# Patient Record
Sex: Male | Born: 1935
Health system: Southern US, Community
[De-identification: ages and names within clinical notes are randomized; demographics above are authoritative.]

## PROBLEM LIST (undated history)

## (undated) DIAGNOSIS — C679 Malignant neoplasm of bladder, unspecified: Secondary | ICD-10-CM

## (undated) DIAGNOSIS — C61 Malignant neoplasm of prostate: Secondary | ICD-10-CM

## (undated) DIAGNOSIS — F0390 Unspecified dementia without behavioral disturbance: Secondary | ICD-10-CM

## (undated) DIAGNOSIS — I10 Essential (primary) hypertension: Secondary | ICD-10-CM

## (undated) DIAGNOSIS — M1990 Unspecified osteoarthritis, unspecified site: Secondary | ICD-10-CM

## (undated) DIAGNOSIS — E785 Hyperlipidemia, unspecified: Secondary | ICD-10-CM

## (undated) DIAGNOSIS — G839 Paralytic syndrome, unspecified: Secondary | ICD-10-CM

## (undated) DIAGNOSIS — N529 Male erectile dysfunction, unspecified: Secondary | ICD-10-CM

## (undated) DIAGNOSIS — G20 Parkinson's disease: Secondary | ICD-10-CM

## (undated) DIAGNOSIS — J189 Pneumonia, unspecified organism: Secondary | ICD-10-CM

## (undated) DIAGNOSIS — G20A1 Parkinson's disease without dyskinesia, without mention of fluctuations: Secondary | ICD-10-CM

## (undated) HISTORY — PX: BACK SURGERY: SHX140

## (undated) HISTORY — PX: APPENDECTOMY: SHX54

## (undated) HISTORY — PX: OTHER SURGICAL HISTORY: SHX169

## (undated) HISTORY — DX: Essential (primary) hypertension: I10

## (undated) HISTORY — DX: Male erectile dysfunction, unspecified: N52.9

## (undated) HISTORY — DX: Hyperlipidemia, unspecified: E78.5

## (undated) HISTORY — DX: Unspecified osteoarthritis, unspecified site: M19.90

---

## 2001-09-25 ENCOUNTER — Encounter: Admission: RE | Admit: 2001-09-25 | Discharge: 2001-09-25 | Payer: Self-pay | Admitting: Family Medicine

## 2001-09-25 ENCOUNTER — Encounter: Payer: Self-pay | Admitting: Family Medicine

## 2005-07-11 HISTORY — PX: COLONOSCOPY: SHX174

## 2005-11-16 ENCOUNTER — Ambulatory Visit: Payer: Self-pay | Admitting: Gastroenterology

## 2005-12-02 ENCOUNTER — Ambulatory Visit: Payer: Self-pay | Admitting: Gastroenterology

## 2005-12-02 LAB — HM COLONOSCOPY

## 2005-12-04 LAB — HM COLONOSCOPY

## 2006-04-11 ENCOUNTER — Encounter: Admission: RE | Admit: 2006-04-11 | Discharge: 2006-04-11 | Payer: Self-pay | Admitting: Family Medicine

## 2006-04-11 ENCOUNTER — Ambulatory Visit: Payer: Self-pay | Admitting: Family Medicine

## 2006-04-28 ENCOUNTER — Ambulatory Visit: Payer: Self-pay | Admitting: Family Medicine

## 2006-05-25 ENCOUNTER — Ambulatory Visit: Payer: Self-pay | Admitting: Family Medicine

## 2007-01-11 ENCOUNTER — Ambulatory Visit: Payer: Self-pay | Admitting: Family Medicine

## 2007-07-17 ENCOUNTER — Ambulatory Visit: Payer: Self-pay | Admitting: Family Medicine

## 2007-07-25 ENCOUNTER — Ambulatory Visit: Payer: Self-pay | Admitting: Family Medicine

## 2008-07-01 ENCOUNTER — Ambulatory Visit: Payer: Self-pay | Admitting: Family Medicine

## 2008-07-09 ENCOUNTER — Ambulatory Visit: Payer: Self-pay | Admitting: Family Medicine

## 2008-09-04 ENCOUNTER — Ambulatory Visit: Payer: Self-pay | Admitting: Family Medicine

## 2009-07-11 HISTORY — PX: OTHER SURGICAL HISTORY: SHX169

## 2009-08-03 ENCOUNTER — Ambulatory Visit: Payer: Self-pay | Admitting: Family Medicine

## 2009-09-02 ENCOUNTER — Ambulatory Visit: Payer: Self-pay | Admitting: Family Medicine

## 2010-02-22 ENCOUNTER — Inpatient Hospital Stay (HOSPITAL_COMMUNITY): Admission: RE | Admit: 2010-02-22 | Discharge: 2010-02-25 | Payer: Self-pay | Admitting: Orthopedic Surgery

## 2010-09-24 LAB — CBC
HCT: 25.5 % — ABNORMAL LOW (ref 39.0–52.0)
HCT: 28.1 % — ABNORMAL LOW (ref 39.0–52.0)
HCT: 31.6 % — ABNORMAL LOW (ref 39.0–52.0)
HCT: 39.5 % (ref 39.0–52.0)
Hemoglobin: 8.7 g/dL — ABNORMAL LOW (ref 13.0–17.0)
Hemoglobin: 13.7 g/dL (ref 13.0–17.0)
MCH: 29.8 pg (ref 26.0–34.0)
MCH: 30.1 pg (ref 26.0–34.0)
MCH: 30.1 pg (ref 26.0–34.0)
MCH: 30.8 pg (ref 26.0–34.0)
MCHC: 34.7 g/dL (ref 30.0–36.0)
MCV: 87.3 fL (ref 78.0–100.0)
MCV: 88.2 fL (ref 78.0–100.0)
MCV: 88.8 fL (ref 78.0–100.0)
Platelets: 151 10*3/uL (ref 150–400)
Platelets: 153 10*3/uL (ref 150–400)
Platelets: 161 10*3/uL (ref 150–400)
RBC: 2.89 MIL/uL — ABNORMAL LOW (ref 4.22–5.81)
RBC: 3.22 MIL/uL — ABNORMAL LOW (ref 4.22–5.81)
RBC: 3.56 MIL/uL — ABNORMAL LOW (ref 4.22–5.81)
RBC: 4.45 MIL/uL (ref 4.22–5.81)
WBC: 11.8 10*3/uL — ABNORMAL HIGH (ref 4.0–10.5)

## 2010-09-24 LAB — URINALYSIS, ROUTINE W REFLEX MICROSCOPIC
Bilirubin Urine: NEGATIVE
Glucose, UA: NEGATIVE mg/dL
Hgb urine dipstick: NEGATIVE
Ketones, ur: NEGATIVE mg/dL
Nitrite: NEGATIVE
Protein, ur: NEGATIVE mg/dL
Specific Gravity, Urine: 1.024 (ref 1.005–1.030)
Urobilinogen, UA: 0.2 mg/dL (ref 0.0–1.0)
pH: 5.5 (ref 5.0–8.0)

## 2010-09-24 LAB — APTT: aPTT: 28 seconds (ref 24–37)

## 2010-09-24 LAB — BASIC METABOLIC PANEL
BUN: 15 mg/dL (ref 6–23)
CO2: 28 mEq/L (ref 19–32)
CO2: 26 mEq/L (ref 19–32)
Chloride: 101 mEq/L (ref 96–112)
Chloride: 104 mEq/L (ref 96–112)
Creatinine, Ser: 1.32 mg/dL (ref 0.4–1.5)
GFR calc Af Amer: >60 mL/min (ref >60)
GFR calc Af Amer: >60 mL/min (ref >60)
Glucose, Bld: 94 mg/dL (ref 70–99)
Potassium: 3.8 mEq/L (ref 3.5–5.1)
Potassium: 4.5 mEq/L (ref 3.5–5.1)
Sodium: 139 mEq/L (ref 135–145)

## 2010-09-24 LAB — DIFFERENTIAL
Basophils Relative: 0 % (ref 0–1)
Eosinophils Absolute: 0.3 10*3/uL (ref 0.0–0.7)
Lymphs Abs: 2.5 10*3/uL (ref 0.7–4.0)
Monocytes Absolute: 0.5 10*3/uL (ref 0.1–1.0)
Monocytes Relative: 7 % (ref 3–12)
Neutrophils Relative %: 60 % (ref 43–77)

## 2010-09-24 LAB — PROTIME-INR: INR: 1.02 (ref 0.00–1.49)

## 2010-09-24 LAB — SURGICAL PCR SCREEN
MRSA, PCR: NEGATIVE
Staphylococcus aureus: NEGATIVE

## 2010-09-24 LAB — TYPE AND SCREEN
ABO/RH(D): A POS
Antibody Screen: NEGATIVE

## 2010-09-24 LAB — ABO/RH: ABO/RH(D): A POS

## 2010-11-23 ENCOUNTER — Other Ambulatory Visit: Payer: Self-pay | Admitting: Family Medicine

## 2010-11-26 ENCOUNTER — Other Ambulatory Visit: Payer: Self-pay | Admitting: Family Medicine

## 2010-12-18 ENCOUNTER — Encounter: Payer: Self-pay | Admitting: Gastroenterology

## 2011-01-31 ENCOUNTER — Encounter: Payer: Self-pay | Admitting: Family Medicine

## 2011-01-31 ENCOUNTER — Ambulatory Visit (INDEPENDENT_AMBULATORY_CARE_PROVIDER_SITE_OTHER): Payer: Medicare Other | Admitting: Family Medicine

## 2011-01-31 VITALS — BP 136/80 | HR 87 | Wt 184.0 lb

## 2011-01-31 DIAGNOSIS — N644 Mastodynia: Secondary | ICD-10-CM

## 2011-01-31 NOTE — Progress Notes (Signed)
  Subjective:    Patient ID: Brian Montgomery, male    DOB: 19-Aug-1935, 75 y.o.   MRN: [id number]  HPI He notes tenderness in his left breast for the last 3 weeks. He's had no recent injury to that area. He has not seen any discharge from the breasts. He is on medications listed in the chart. He does not use street drugs.   Review of Systems     Objective:   Physical Exam Exam of the left breast does show a slight fullness in the nipple area. Abdominal exam shows no masses or tenderness.       Assessment & Plan:  Left breast pain. He will be sent for mammogram.

## 2011-03-28 ENCOUNTER — Encounter: Payer: Self-pay | Admitting: Family Medicine

## 2011-05-27 ENCOUNTER — Other Ambulatory Visit: Payer: Self-pay | Admitting: Family Medicine

## 2011-05-27 NOTE — Telephone Encounter (Signed)
Pt needs med check

## 2011-07-13 DIAGNOSIS — J069 Acute upper respiratory infection, unspecified: Secondary | ICD-10-CM | POA: Diagnosis not present

## 2011-08-21 ENCOUNTER — Other Ambulatory Visit: Payer: Self-pay | Admitting: Family Medicine

## 2011-11-24 ENCOUNTER — Other Ambulatory Visit: Payer: Self-pay | Admitting: Family Medicine

## 2012-01-03 DIAGNOSIS — M1710 Unilateral primary osteoarthritis, unspecified knee: Secondary | ICD-10-CM | POA: Diagnosis not present

## 2012-02-24 ENCOUNTER — Other Ambulatory Visit: Payer: Self-pay | Admitting: Family Medicine

## 2012-02-24 NOTE — Telephone Encounter (Signed)
PATIENT NEEDS TO SCHEDULE A OFFICE VISIT IN ORDER TO GET ANY MORE REFILLS.

## 2012-03-22 ENCOUNTER — Other Ambulatory Visit: Payer: Self-pay | Admitting: Family Medicine

## 2012-03-23 ENCOUNTER — Telehealth: Payer: Self-pay | Admitting: Internal Medicine

## 2012-03-23 MED ORDER — SIMVASTATIN 20 MG PO TABS: 20.0000 mg | ORAL_TABLET | Freq: Every day | ORAL | Status: DC

## 2012-03-23 NOTE — Telephone Encounter (Signed)
Sent med in pt needs med check 

## 2012-03-29 ENCOUNTER — Encounter: Payer: Self-pay | Admitting: Gastroenterology

## 2012-04-16 ENCOUNTER — Other Ambulatory Visit: Payer: Self-pay | Admitting: Family Medicine

## 2012-04-24 ENCOUNTER — Other Ambulatory Visit: Payer: Self-pay | Admitting: Family Medicine

## 2012-05-24 ENCOUNTER — Telehealth: Payer: Self-pay | Admitting: Internal Medicine

## 2012-05-24 NOTE — Telephone Encounter (Signed)
I have called pt on cell phone and mailbox is full so can't leave a message, and called pt at the home number and and left message and told him to call. HE NEEDS TO SET UP AN APPT SO HE CAN GET HIS MEDS REFILLED.

## 2012-05-25 ENCOUNTER — Encounter: Payer: Medicare Other | Admitting: Family Medicine

## 2012-05-25 ENCOUNTER — Ambulatory Visit (INDEPENDENT_AMBULATORY_CARE_PROVIDER_SITE_OTHER): Payer: Medicare Other | Admitting: Family Medicine

## 2012-05-25 ENCOUNTER — Encounter: Payer: Self-pay | Admitting: Family Medicine

## 2012-05-25 VITALS — BP 128/80 | HR 68 | Wt 183.0 lb

## 2012-05-25 DIAGNOSIS — I10 Essential (primary) hypertension: Secondary | ICD-10-CM | POA: Diagnosis not present

## 2012-05-25 DIAGNOSIS — Z79899 Other long term (current) drug therapy: Secondary | ICD-10-CM

## 2012-05-25 DIAGNOSIS — M129 Arthropathy, unspecified: Secondary | ICD-10-CM

## 2012-05-25 DIAGNOSIS — Z23 Encounter for immunization: Secondary | ICD-10-CM

## 2012-05-25 DIAGNOSIS — E785 Hyperlipidemia, unspecified: Secondary | ICD-10-CM

## 2012-05-25 DIAGNOSIS — N529 Male erectile dysfunction, unspecified: Secondary | ICD-10-CM | POA: Diagnosis not present

## 2012-05-25 DIAGNOSIS — M1990 Unspecified osteoarthritis, unspecified site: Secondary | ICD-10-CM

## 2012-05-25 LAB — LIPID PANEL
HDL: 68 mg/dL (ref >39)
Triglycerides: 50 mg/dL (ref <150)

## 2012-05-25 LAB — COMPREHENSIVE METABOLIC PANEL
AST: 23 U/L (ref 0–37)
BUN: 18 mg/dL (ref 6–23)
Chloride: 104 mEq/L (ref 96–112)
Creat: 1.13 mg/dL (ref 0.50–1.35)
Glucose, Bld: 83 mg/dL (ref 70–99)
Potassium: 4.3 mEq/L (ref 3.5–5.3)

## 2012-05-25 MED ORDER — INFLUENZA VIRUS VACC SPLIT PF IM SUSP: 0.5000 mL | Freq: Once | INTRAMUSCULAR | Status: DC

## 2012-05-25 MED ORDER — SIMVASTATIN 20 MG PO TABS: 20.0000 mg | ORAL_TABLET | Freq: Every day | ORAL | Status: DC

## 2012-05-25 MED ORDER — TADALAFIL 20 MG PO TABS: 20.0000 mg | ORAL_TABLET | ORAL | Status: DC | PRN

## 2012-05-25 MED ORDER — LISINOPRIL-HYDROCHLOROTHIAZIDE 10-12.5 MG PO TABS: 1.0000 | ORAL_TABLET | Freq: Every day | ORAL | Status: DC

## 2012-05-25 NOTE — Progress Notes (Signed)
  Subjective:    Patient ID: Brian Montgomery, male    DOB: [birth year], 76 y.o.   MRN: [id number]  HPI He is here for a medication check. He has enjoyed excellent health. He did have left knee replacement and is doing quite well with this. He is having no other arthritis related symptoms. He does have some difficulty with erectile dysfunction and would like some medication for this. Continues on his blood pressure medication as well as Zocor and is having no difficulty with this. Social and family history were reviewed.   Review of Systems     Objective:   Physical Exam alert and in no distress. Tympanic membranes and canals are normal. Throat is clear. Tonsils are normal. Neck is supple without adenopathy or thyromegaly. Cardiac exam shows a regular sinus rhythm without murmurs or gallops. Lungs are clear to auscultation.        Assessment & Plan:   1. Hypertension  lisinopril-hydrochlorothiazide (PRINZIDE,ZESTORETIC) 10-12.5 MG per tablet, CBC with Differential, Comprehensive metabolic panel  2. ED (erectile dysfunction)  tadalafil (CIALIS) 20 MG tablet  3. Hyperlipidemia LDL goal < 100  simvastatin (ZOCOR) 20 MG tablet, Lipid panel  4. Arthritis    5. Encounter for long-term (current) use of other medications  Lipid panel, CBC with Differential, Comprehensive metabolic panel, DISCONTINUED: influenza  inactive virus vaccine (FLUZONE/FLUARIX) injection  6. Need for prophylactic vaccination and inoculation against influenza  influenza  inactive virus vaccine (FLUZONE/FLUARIX) injection 0.5 mL   flu shot given with risks and benefits discussed.

## 2012-05-26 LAB — CBC WITH DIFFERENTIAL/PLATELET
Basophils Absolute: 0 10*3/uL (ref 0.0–0.1)
Eosinophils Relative: 2 % (ref 0–5)
HCT: 41.4 % (ref 39.0–52.0)
Hemoglobin: 14 g/dL (ref 13.0–17.0)
Lymphocytes Relative: 31 % (ref 12–46)
MCHC: 33.8 g/dL (ref 30.0–36.0)
MCV: 89.2 fL (ref 78.0–100.0)
Monocytes Absolute: 0.5 10*3/uL (ref 0.1–1.0)
Monocytes Relative: 7 % (ref 3–12)
RDW: 14 % (ref 11.5–15.5)
WBC: 7.7 10*3/uL (ref 4.0–10.5)

## 2012-05-28 NOTE — Progress Notes (Signed)
Quick Note:  Called pt home to inform him of his labs pt verbalized understanding labs look good and continue present med's ______

## 2012-09-17 ENCOUNTER — Ambulatory Visit (INDEPENDENT_AMBULATORY_CARE_PROVIDER_SITE_OTHER): Payer: Medicare Other | Admitting: Family Medicine

## 2012-09-17 ENCOUNTER — Encounter: Payer: Self-pay | Admitting: Family Medicine

## 2012-09-17 VITALS — BP 112/72 | HR 64 | Ht 66.0 in | Wt 182.0 lb

## 2012-09-17 DIAGNOSIS — R42 Dizziness and giddiness: Secondary | ICD-10-CM | POA: Diagnosis not present

## 2012-09-17 DIAGNOSIS — R319 Hematuria, unspecified: Secondary | ICD-10-CM | POA: Diagnosis not present

## 2012-09-17 DIAGNOSIS — I10 Essential (primary) hypertension: Secondary | ICD-10-CM

## 2012-09-17 LAB — POCT URINALYSIS DIPSTICK
Bilirubin, UA: NEGATIVE
Ketones, UA: NEGATIVE
Leukocytes, UA: NEGATIVE
pH, UA: 6

## 2012-09-17 MED ORDER — MECLIZINE HCL 25 MG PO TABS: 12.5000 mg | ORAL_TABLET | Freq: Three times a day (TID) | ORAL | Status: DC | PRN

## 2012-09-17 NOTE — Progress Notes (Signed)
Chief Complaint  Patient presents with  . Dizziness    is having dizziness when he gets up and walks around, also when he is sitting and just moves his head x 5-7 days.    Woke up this morning, and when he got out of bed he felt very dizzy.  Needed to hold on to rail of bed to keep from falling.  Feels dizzy any time he moves his head, further described as his balance being off, some spinning sensation.  Denies feeling lightheaded or presyncopal. Denies any other neuro symptoms--no trouble with speech, memory, weakness, numbness or tingling.  No problems until today, was fine last night.  Slight cough, but no significant URI symptoms.  Slightly runny nose yesterday. Denies ear pain.  Has noted decreased hearing in R ear over the years, nothing acute.  Denies tinnitus.  Denies headache (slight one the other day).  About a week ago he had something similar happen--he felt dizzy when he sat up (moved to turn around, felt dizzy), but he laid back down, and when he got up later he was fine.    Noted some blood in his urine the other day (4 days ago).  Denies urgency/frequency/dysuria.  He states it started after doing a plank exercise.  Urine has been clear after just that one day (2 voids).  Denies fevers, flank pain or other problems  Past Medical History  Diagnosis Date  . ED (erectile dysfunction)   . Dyslipidemia   . Hypertension   . Arthritis    Past Surgical History  Procedure Laterality Date  . Left total knee  2011   History   Social History  . Marital Status: Married    Spouse Name: N/A    Number of Children: N/A  . Years of Education: N/A   Occupational History  . Not on file.   Social History Main Topics  . Smoking status: Never Smoker   . Smokeless tobacco: Never Used  . Alcohol Use: Yes     Comment: rare  . Drug Use: No  . Sexually Active: Yes   Other Topics Concern  . Not on file   Social History Narrative  . No narrative on file   Current Outpatient  Prescriptions on File Prior to Visit  Medication Sig Dispense Refill  . aspirin 81 MG tablet Take 81 mg by mouth daily.      . fish oil-omega-3 fatty acids 1000 MG capsule Take 1 g by mouth daily.       Marland Kitchen lisinopril-hydrochlorothiazide (PRINZIDE,ZESTORETIC) 10-12.5 MG per tablet Take 1 tablet by mouth daily.  30 tablet  11  . Multiple Vitamins-Minerals (MULTIVITAMIN WITH MINERALS) tablet Take 1 tablet by mouth daily.        . simvastatin (ZOCOR) 20 MG tablet Take 1 tablet (20 mg total) by mouth daily.  30 tablet  11  . tadalafil (CIALIS) 20 MG tablet Take 1 tablet (20 mg total) by mouth every other day as needed for erectile dysfunction.  3 tablet  0-   No current facility-administered medications on file prior to visit.   No Known Allergies  ROS:  Denies Denies back pain, fevers, nausea, vomiting, diarrhea, no blood in bowels.  No abdominal pain.  No numbness, tingling, weakness, headache, URI symptoms except her HPI.  Denies bleeding (just hematuria last week), bruising or other skin concerns.  PHYSICAL EXAM: BP 122/80  Pulse 76  Ht 5\' 6"  (1.676 m)  Wt 182 lb (82.555 kg)  BMI 29.39  kg/m2 Well developed, pleasant male, accompanied by his daughter, in no distress HEENT:  PERRL, EOMI, conjunctiva clear.  TM's and EACs normal bilaterally.  Nasal mucosa with clear drainage, no erythema or purulence.  Sinuses nontender Neck: no lymphadenopathy, thyromegaly or carotid bruit Heart: regular rate and rhythm, no murmur Lungs: clear bilaterally Abdomen: soft, nontender, no mass Extremities: no edema Skin: no rash, bruising Neuro: alert and oriented.  Cranial nerves 2-12 intact.  Normal strength, sensation.  Slow finger-to-nose, symmetric bilaterally.  Problems with rapid alternating movements but symmetric.  No pronator drift +vertigo with lying down and head turned to the right (started about 15-20 seconds after lying down).  Worse when he sat up, had to hold onto chair (no nystagmus  noted). When lying with head to left, had no dizziness until he sat up--was only very shortlived, and didn't need to hold on, only felt dizzy while en route to sitting up  ASSESSMENT/PLAN:  Dizziness and giddiness - suspect BPV, versus labyrinthitis (viral).  Use Meclizine prn.  risks/side effects reviewed.  consider PT if persistent/worsening vertigo. f/u if new symptoms - Plan: Comprehensive metabolic panel, CBC with Differential, meclizine (ANTIVERT) 25 MG tablet  Hematuria - ?etiology.  only trace blood on dipstick today.  return immediately if recurs - Plan: CBC with Differential, Urinalysis Dipstick  Essential hypertension, benign - well controlled.  No orthostasis. He was physically active yesterday, and on diuretic--encouraged increased fluid intake. - Plan: Comprehensive metabolic panel   Drink plenty of fluids Meclizine prn--risks/side effects reviewed If symptoms worsening, may need referral to PT Discussed other neuro symptoms, and if any develop, follow up immediately.  Check labs today given h/o recent hematuria, and his HTN and med use to r/o contributing factors.

## 2012-09-17 NOTE — Patient Instructions (Signed)
Meclizine will help with the vertigo.  Take 1/2 to 1 tablet as needed for dizziness.  Try 1/2 first, and if not helpful, then increase to full tablet. Do not drive if drowsy from medication, or if having dizziness

## 2012-09-18 LAB — CBC WITH DIFFERENTIAL/PLATELET
Eosinophils Absolute: 0.1 10*3/uL (ref 0.0–0.7)
Eosinophils Relative: 1 % (ref 0–5)
HCT: 41.4 % (ref 39.0–52.0)
Hemoglobin: 14.2 g/dL (ref 13.0–17.0)
Lymphs Abs: 2 10*3/uL (ref 0.7–4.0)
MCH: 30.1 pg (ref 26.0–34.0)
MCV: 87.7 fL (ref 78.0–100.0)
Monocytes Relative: 6 % (ref 3–12)
RBC: 4.72 MIL/uL (ref 4.22–5.81)

## 2012-09-18 LAB — COMPREHENSIVE METABOLIC PANEL
Alkaline Phosphatase: 71 U/L (ref 39–117)
BUN: 22 mg/dL (ref 6–23)
Glucose, Bld: 94 mg/dL (ref 70–99)
Total Bilirubin: 0.7 mg/dL (ref 0.3–1.2)

## 2012-09-18 NOTE — Progress Notes (Signed)
Quick Note:  Pt advised word for word   Advise pt--labs normal except WBC count was a little high. This indicates infection. He had no signs of infection (fever, cough, etc) other than his vertigo; if he develops others, let us know. There was NO anemia  Pt states he feels a little better ______

## 2012-10-08 ENCOUNTER — Telehealth: Payer: Self-pay | Admitting: Family Medicine

## 2012-10-08 NOTE — Telephone Encounter (Signed)
He was seen for vertigo 3 weeks ago.  I would have thought he should be much improved by now, not needing medication.  If ongoing problems, f/u with Dr. Susann Givens.  This medication is available OTC without rx, but if needing to use it on a regular basis, should schedule f/u with his regular MD, Dr. Susann Givens

## 2012-10-08 NOTE — Telephone Encounter (Signed)
Spoke with [name] and Mr.Marker is still having some issues with dizziness. Scheduled appt with Dr.Lalonde for 10/11/12 @ 1:30pm.

## 2012-10-11 ENCOUNTER — Ambulatory Visit: Payer: Self-pay | Admitting: Family Medicine

## 2012-10-15 ENCOUNTER — Encounter: Payer: Self-pay | Admitting: Internal Medicine

## 2012-10-18 ENCOUNTER — Ambulatory Visit (INDEPENDENT_AMBULATORY_CARE_PROVIDER_SITE_OTHER): Payer: Medicare Other | Admitting: Family Medicine

## 2012-10-18 ENCOUNTER — Encounter: Payer: Self-pay | Admitting: Family Medicine

## 2012-10-18 VITALS — BP 130/70 | HR 66 | Wt 185.0 lb

## 2012-10-18 DIAGNOSIS — R42 Dizziness and giddiness: Secondary | ICD-10-CM | POA: Diagnosis not present

## 2012-10-18 MED ORDER — MECLIZINE HCL 25 MG PO TABS: 12.5000 mg | ORAL_TABLET | Freq: Three times a day (TID) | ORAL | Status: DC | PRN

## 2012-10-18 NOTE — Patient Instructions (Signed)
You to move slowly from one position to another especially when you get out of bed.I will call in the meclizine again and you need to take it away as prescribed on the days that you have symptoms only. I think this will slowly go away over the next month or so if it does not ,come back.

## 2012-10-18 NOTE — Progress Notes (Signed)
  Subjective:    Patient ID: Brian Montgomery, male    DOB: 1935-09-10, 77 y.o.   MRN: [id number]  HPI He is here for recheck on his dizziness.he states he notes lightheaded feeling when he sits up in the morning but it goes away fairly quickly. He does note that when he lies down again he will get slightly dizzy but it goes away very quickly. No nausea, vomiting, tinnitus, decreased hearing. He states that he took the meclizine on a regular basis instead of as needed and did note some slight improvement in his symptoms.   Review of Systems     Objective:   Physical Exam Alert and in no distress. EOMI. Other cranial nerves grossly intact. Cardiac exam shows regular rhythm without murmurs or gallops. Lungs clear to auscultation. Cerebellar testing normal.       Assessment & Plan:  Dizziness and giddiness - Plan: meclizine (ANTIVERT) 25 MG tablet he is to take the meclizine on an as-needed basis. Also recommend getting up slowly from one position to another. Reassured him that I thought his symptoms would eventually go away. If they do not, history turned here for followup.

## 2013-04-29 ENCOUNTER — Ambulatory Visit (INDEPENDENT_AMBULATORY_CARE_PROVIDER_SITE_OTHER): Payer: Medicare Other | Admitting: Family Medicine

## 2013-04-29 ENCOUNTER — Encounter: Payer: Self-pay | Admitting: Family Medicine

## 2013-04-29 VITALS — BP 120/70 | HR 80 | Wt 187.0 lb

## 2013-04-29 DIAGNOSIS — S058X9A Other injuries of unspecified eye and orbit, initial encounter: Secondary | ICD-10-CM

## 2013-04-29 DIAGNOSIS — S0502XA Injury of conjunctiva and corneal abrasion without foreign body, left eye, initial encounter: Secondary | ICD-10-CM

## 2013-04-29 DIAGNOSIS — Z23 Encounter for immunization: Secondary | ICD-10-CM | POA: Diagnosis not present

## 2013-04-29 NOTE — Progress Notes (Signed)
  Subjective:    Patient ID: Brian Montgomery, male    DOB: Aug 11, 1935, 77 y.o.   MRN: [id number]  HPI He is here for evaluation of a feeling of foreign body in his left eye this started 3 days ago while he was mowing. He is a very little drainage from the eye.   Review of Systems     Objective:   Physical Exam Exam of the left eye does show an eye lash in the lower eyelid near the conjunctiva. It was removed without difficulty. Staining of the eye showed a very small abrasion near the corneal limbal junction at 6:00.       Assessment & Plan:  Need for prophylactic vaccination and inoculation against influenza - Plan: Flu vaccine HIGH DOSE PF (Fluzone Tri High dose)  Corneal abrasion, left, initial encounter  recommend conservative care for the eye. Explained that that small abrasion will probably heal on its own without further intervention. He was comfortable with that. Flu shot with risks and benefits discussed.

## 2013-05-03 ENCOUNTER — Ambulatory Visit (INDEPENDENT_AMBULATORY_CARE_PROVIDER_SITE_OTHER): Payer: Medicare Other | Admitting: Medical

## 2013-05-03 ENCOUNTER — Encounter: Payer: Self-pay | Admitting: Medical

## 2013-05-03 VITALS — BP 120/60 | HR 90 | Temp 98.3°F | Resp 18 | Wt 190.0 lb

## 2013-05-03 DIAGNOSIS — R05 Cough: Secondary | ICD-10-CM

## 2013-05-03 DIAGNOSIS — J209 Acute bronchitis, unspecified: Secondary | ICD-10-CM | POA: Diagnosis not present

## 2013-05-03 MED ORDER — AZITHROMYCIN 250 MG PO TABS: ORAL_TABLET | ORAL | Status: DC

## 2013-05-03 MED ORDER — HYDROCODONE-HOMATROPINE 5-1.5 MG/5ML PO SYRP: 5.0000 mL | ORAL_SOLUTION | Freq: Three times a day (TID) | ORAL | Status: DC | PRN

## 2013-05-03 NOTE — Progress Notes (Signed)
Subjective:  Brian Montgomery is a 77 y.o. male who presents for cough.  He is accompanied by his wife.  He reports getting a flu shot Monday, and later that day he began getting a cough.  Since then he has gotten worsening raspy cough, worse in chest, chest congestion, some productive cough with mucus, thinks he is starting to get a chest infection. He denies reflux, allergy symptoms, chest pain, shortness of breath, wheezing, nausea, vomiting, diarrhea, sinus pressure, sore throat, ear pain.  Treatment to date: none.  No sick contacts.   He does not smoke.   He does not have a history of bronchitis.   No other aggravating or relieving factors.  No other c/o.  The following portions of the patient's history were reviewed and updated as appropriate: allergies, current medications, past family history, past medical history, past social history, past surgical history and problem list.  ROS as in subjective  Past Medical History  Diagnosis Date  . ED (erectile dysfunction)   . Dyslipidemia   . Hypertension   . Arthritis      Objective: Vital signs reviewed  General appearance: Alert, WD/WN, no distress                             Skin: warm, no rash, no diaphoresis                           Head: no sinus tenderness                            Eyes: conjunctiva normal, corneas clear, PERRLA                            Ears: pearly TMs, external ear canals normal                          Nose: septum midline, turbinates swollen, with erythema and clear discharge             Mouth/throat: MMM, tongue normal, mild pharyngeal erythema                           Neck: supple, no adenopathy, no thyromegaly, nontender                          Heart: RRR, normal S1, S2, no murmurs                         Lungs: +decreased breath sounds, but no rhonchi, no wheezes, no rales                Extremities: no edema, nontender     Assessment: Encounter Diagnoses  Name Primary?  . Cough Yes  . Acute  bronchitis    Plan:  Medication orders today include: Zpak, Hycodan syrup.  Discussed diagnosis and treatment of bronchitis.  Suggested symptomatic OTC remedies for cough and congestion.  Tylenol OTC for fever and malaise.  Call/return in 2-3 days if symptoms are worse or not improving. Call report/recheck early next week.

## 2013-05-07 ENCOUNTER — Telehealth: Payer: Self-pay | Admitting: Medical

## 2013-05-08 ENCOUNTER — Other Ambulatory Visit: Payer: Self-pay | Admitting: Medical

## 2013-05-08 MED ORDER — BENZONATATE 200 MG PO CAPS: 200.0000 mg | ORAL_CAPSULE | Freq: Two times a day (BID) | ORAL | Status: DC | PRN

## 2013-05-08 NOTE — Telephone Encounter (Signed)
i sent tessalon Perles cough suppressant.

## 2013-05-25 ENCOUNTER — Other Ambulatory Visit: Payer: Self-pay | Admitting: Family Medicine

## 2013-06-22 ENCOUNTER — Other Ambulatory Visit: Payer: Self-pay | Admitting: Family Medicine

## 2013-07-24 ENCOUNTER — Other Ambulatory Visit: Payer: Self-pay | Admitting: Family Medicine

## 2013-07-26 ENCOUNTER — Telehealth: Payer: Self-pay | Admitting: Family Medicine

## 2013-07-26 MED ORDER — LISINOPRIL-HYDROCHLOROTHIAZIDE 10-12.5 MG PO TABS: 1.0000 | ORAL_TABLET | Freq: Every day | ORAL | Status: DC

## 2013-07-26 MED ORDER — SIMVASTATIN 20 MG PO TABS: 20.0000 mg | ORAL_TABLET | Freq: Every day | ORAL | Status: DC

## 2013-07-26 NOTE — Telephone Encounter (Signed)
Rx sent to pharm

## 2013-07-26 NOTE — Telephone Encounter (Signed)
Pt made appt needs refills

## 2013-08-06 ENCOUNTER — Encounter: Payer: Self-pay | Admitting: Family Medicine

## 2013-08-06 ENCOUNTER — Ambulatory Visit (INDEPENDENT_AMBULATORY_CARE_PROVIDER_SITE_OTHER): Payer: Medicare Other | Admitting: Family Medicine

## 2013-08-06 VITALS — BP 130/86 | HR 76 | Wt 187.0 lb

## 2013-08-06 DIAGNOSIS — E785 Hyperlipidemia, unspecified: Secondary | ICD-10-CM

## 2013-08-06 DIAGNOSIS — Z79899 Other long term (current) drug therapy: Secondary | ICD-10-CM

## 2013-08-06 DIAGNOSIS — Z23 Encounter for immunization: Secondary | ICD-10-CM | POA: Diagnosis not present

## 2013-08-06 DIAGNOSIS — I10 Essential (primary) hypertension: Secondary | ICD-10-CM

## 2013-08-06 LAB — CBC WITH DIFFERENTIAL/PLATELET
Basophils Absolute: 0 10*3/uL (ref 0.0–0.1)
Basophils Relative: 0 % (ref 0–1)
Eosinophils Absolute: 0.1 10*3/uL (ref 0.0–0.7)
Eosinophils Relative: 2 % (ref 0–5)
HCT: 42.5 % (ref 39.0–52.0)
Hemoglobin: 14.3 g/dL (ref 13.0–17.0)
LYMPHS ABS: 2.3 10*3/uL (ref 0.7–4.0)
LYMPHS PCT: 28 % (ref 12–46)
MCH: 29.7 pg (ref 26.0–34.0)
MCHC: 33.6 g/dL (ref 30.0–36.0)
MCV: 88.4 fL (ref 78.0–100.0)
Monocytes Absolute: 0.5 10*3/uL (ref 0.1–1.0)
Monocytes Relative: 6 % (ref 3–12)
NEUTROS PCT: 64 % (ref 43–77)
Neutro Abs: 5.2 10*3/uL (ref 1.7–7.7)
PLATELETS: 241 10*3/uL (ref 150–400)
RBC: 4.81 MIL/uL (ref 4.22–5.81)
RDW: 14.6 % (ref 11.5–15.5)
WBC: 8.1 10*3/uL (ref 4.0–10.5)

## 2013-08-06 LAB — COMPREHENSIVE METABOLIC PANEL
ALT: 18 U/L (ref 0–53)
AST: 21 U/L (ref 0–37)
Albumin: 4.4 g/dL (ref 3.5–5.2)
Alkaline Phosphatase: 60 U/L (ref 39–117)
BUN: 21 mg/dL (ref 6–23)
CALCIUM: 9.9 mg/dL (ref 8.4–10.5)
CHLORIDE: 101 meq/L (ref 96–112)
CO2: 28 meq/L (ref 19–32)
Creat: 1.33 mg/dL (ref 0.50–1.35)
Glucose, Bld: 91 mg/dL (ref 70–99)
Potassium: 4.6 mEq/L (ref 3.5–5.3)
SODIUM: 138 meq/L (ref 135–145)
TOTAL PROTEIN: 7.3 g/dL (ref 6.0–8.3)
Total Bilirubin: 0.6 mg/dL (ref 0.3–1.2)

## 2013-08-06 LAB — LIPID PANEL
CHOLESTEROL: 182 mg/dL (ref 0–200)
HDL: 56 mg/dL (ref >39)
LDL Cholesterol: 105 mg/dL — ABNORMAL HIGH (ref 0–99)
Total CHOL/HDL Ratio: 3.3 Ratio
Triglycerides: 104 mg/dL (ref <150)
VLDL: 21 mg/dL (ref 0–40)

## 2013-08-06 MED ORDER — LISINOPRIL-HYDROCHLOROTHIAZIDE 10-12.5 MG PO TABS: 1.0000 | ORAL_TABLET | Freq: Every day | ORAL | Status: DC

## 2013-08-06 MED ORDER — SIMVASTATIN 20 MG PO TABS: 20.0000 mg | ORAL_TABLET | Freq: Every day | ORAL | Status: DC

## 2013-08-06 NOTE — Progress Notes (Signed)
   Subjective:    Patient ID: Brian Montgomery, male    DOB: 08-Apr-1936, 78 y.o.   MRN: [id number]  HPI He is here for an interval evaluation. He does have hypertension continues on lisinopril/HCTZ. He also takes Zocor and is having no difficulty with this. He has no particular concerns or complaints. Review of systems is essentially negative. Immunizations were reviewed and he does need pneumococcal vaccine. His last colonoscopy was in 2007. Social and family history are reviewed.   Review of Systems     Objective:   Physical Exam alert and in no distress. Tympanic membranes and canals are normal. Throat is clear. Tonsils are normal. Neck is supple without adenopathy or thyromegaly. Cardiac exam shows a regular sinus rhythm without murmurs or gallops. Lungs are clear to auscultation.        Assessment & Plan:  Encounter for long-term (current) use of other medications - Plan: Pneumococcal conjugate vaccine 13-valent, CBC with Differential, Comprehensive metabolic panel, Lipid panel  Essential hypertension, benign - Plan: lisinopril-hydrochlorothiazide (PRINZIDE,ZESTORETIC) 10-12.5 MG per tablet, CBC with Differential, Comprehensive metabolic panel  Hyperlipidemia LDL goal < 100 - Plan: simvastatin (ZOCOR) 20 MG tablet, Lipid panel  discussed repeating the colonoscopy and at this point he is not interested. Also discussed PSA testing and since 77 that is not indicated. He is comfortable with this.

## 2014-02-24 ENCOUNTER — Ambulatory Visit
Admission: RE | Admit: 2014-02-24 | Discharge: 2014-02-24 | Disposition: A | Discharge status: Home / Self Care | Payer: BLUE CROSS/BLUE SHIELD | Source: Ambulatory Visit | Attending: Family Medicine | Admitting: Family Medicine

## 2014-02-24 ENCOUNTER — Encounter: Payer: Self-pay | Admitting: Family Medicine

## 2014-02-24 ENCOUNTER — Ambulatory Visit (INDEPENDENT_AMBULATORY_CARE_PROVIDER_SITE_OTHER): Payer: Medicare Other | Admitting: Family Medicine

## 2014-02-24 VITALS — BP 140/90 | HR 74 | Wt 188.0 lb

## 2014-02-24 DIAGNOSIS — M25511 Pain in right shoulder: Secondary | ICD-10-CM

## 2014-02-24 DIAGNOSIS — M25519 Pain in unspecified shoulder: Secondary | ICD-10-CM

## 2014-02-24 DIAGNOSIS — R42 Dizziness and giddiness: Secondary | ICD-10-CM | POA: Diagnosis not present

## 2014-02-24 DIAGNOSIS — M19019 Primary osteoarthritis, unspecified shoulder: Secondary | ICD-10-CM | POA: Diagnosis not present

## 2014-02-24 NOTE — Progress Notes (Signed)
   Subjective:    Patient ID: Brian Montgomery, male    DOB: 1936-06-08, 78 y.o.   MRN: [id number]  HPI  He is here for evaluation of intermittent dizziness over the last several months he notes that he can relate the dizziness to head position but no particular one. He also is noted difficulty walking to the mailbox. He states he will go to the left and to the right but again it only lasts for several minutes. No burred vision, double vision, weakness, heart rate changes, headache. He also complains of bilateral shoulder pain, right greater than left. No history of injury. He can get full motion but notes discomfort with the extremes of motion.   Review of Systems     Objective:   Physical Exam alert and in no distress. EOMI. Other cranial nerves grossly intact. No carotid bruits noted. Cerebellar testing normal. DTRs normal. Tympanic membranes and canals are normal. Throat is clear. Tonsils are normal. Neck is supple without adenopathy or thyromegaly. Cardiac exam shows a regular sinus rhythm without murmurs or gallops. Lungs are clear to auscultation. Right shoulder exam does show full motion however there is discomfort at the extremes of all motion. No laxity noted. Strength is normal.       Assessment & Plan:  Right shoulder pain - Plan: DG Shoulder Right  Vertigo  the vertigo evaluation was negative. He does not give a good history for and in particular other than possibly an air. Recommended that he pay more attention to when he has symptoms estimated thing that makes it better or worse and let me know.

## 2014-02-24 NOTE — Patient Instructions (Signed)
Pay attention to your dizziness and see if there is a pattern to it. If this continues, back for recheck.

## 2014-04-01 DIAGNOSIS — M25569 Pain in unspecified knee: Secondary | ICD-10-CM | POA: Diagnosis not present

## 2014-04-09 DIAGNOSIS — M75120 Complete rotator cuff tear or rupture of unspecified shoulder, not specified as traumatic: Secondary | ICD-10-CM | POA: Diagnosis not present

## 2014-04-15 DIAGNOSIS — M25512 Pain in left shoulder: Secondary | ICD-10-CM | POA: Diagnosis not present

## 2014-04-15 DIAGNOSIS — M75122 Complete rotator cuff tear or rupture of left shoulder, not specified as traumatic: Secondary | ICD-10-CM | POA: Diagnosis not present

## 2014-04-15 DIAGNOSIS — M75121 Complete rotator cuff tear or rupture of right shoulder, not specified as traumatic: Secondary | ICD-10-CM | POA: Diagnosis not present

## 2014-04-15 DIAGNOSIS — M25511 Pain in right shoulder: Secondary | ICD-10-CM | POA: Diagnosis not present

## 2014-04-17 DIAGNOSIS — M75122 Complete rotator cuff tear or rupture of left shoulder, not specified as traumatic: Secondary | ICD-10-CM | POA: Diagnosis not present

## 2014-04-17 DIAGNOSIS — M25511 Pain in right shoulder: Secondary | ICD-10-CM | POA: Diagnosis not present

## 2014-04-17 DIAGNOSIS — M75121 Complete rotator cuff tear or rupture of right shoulder, not specified as traumatic: Secondary | ICD-10-CM | POA: Diagnosis not present

## 2014-04-17 DIAGNOSIS — M25512 Pain in left shoulder: Secondary | ICD-10-CM | POA: Diagnosis not present

## 2014-04-22 DIAGNOSIS — M75121 Complete rotator cuff tear or rupture of right shoulder, not specified as traumatic: Secondary | ICD-10-CM | POA: Diagnosis not present

## 2014-04-22 DIAGNOSIS — M25512 Pain in left shoulder: Secondary | ICD-10-CM | POA: Diagnosis not present

## 2014-04-22 DIAGNOSIS — M25511 Pain in right shoulder: Secondary | ICD-10-CM | POA: Diagnosis not present

## 2014-04-22 DIAGNOSIS — M75122 Complete rotator cuff tear or rupture of left shoulder, not specified as traumatic: Secondary | ICD-10-CM | POA: Diagnosis not present

## 2014-04-24 DIAGNOSIS — M25511 Pain in right shoulder: Secondary | ICD-10-CM | POA: Diagnosis not present

## 2014-04-24 DIAGNOSIS — M25512 Pain in left shoulder: Secondary | ICD-10-CM | POA: Diagnosis not present

## 2014-04-24 DIAGNOSIS — M75121 Complete rotator cuff tear or rupture of right shoulder, not specified as traumatic: Secondary | ICD-10-CM | POA: Diagnosis not present

## 2014-04-24 DIAGNOSIS — M75122 Complete rotator cuff tear or rupture of left shoulder, not specified as traumatic: Secondary | ICD-10-CM | POA: Diagnosis not present

## 2014-04-28 DIAGNOSIS — M25512 Pain in left shoulder: Secondary | ICD-10-CM | POA: Diagnosis not present

## 2014-04-28 DIAGNOSIS — M25511 Pain in right shoulder: Secondary | ICD-10-CM | POA: Diagnosis not present

## 2014-04-28 DIAGNOSIS — M75122 Complete rotator cuff tear or rupture of left shoulder, not specified as traumatic: Secondary | ICD-10-CM | POA: Diagnosis not present

## 2014-04-28 DIAGNOSIS — M75121 Complete rotator cuff tear or rupture of right shoulder, not specified as traumatic: Secondary | ICD-10-CM | POA: Diagnosis not present

## 2014-05-01 DIAGNOSIS — M25511 Pain in right shoulder: Secondary | ICD-10-CM | POA: Diagnosis not present

## 2014-05-01 DIAGNOSIS — M75122 Complete rotator cuff tear or rupture of left shoulder, not specified as traumatic: Secondary | ICD-10-CM | POA: Diagnosis not present

## 2014-05-01 DIAGNOSIS — M75121 Complete rotator cuff tear or rupture of right shoulder, not specified as traumatic: Secondary | ICD-10-CM | POA: Diagnosis not present

## 2014-05-01 DIAGNOSIS — M25512 Pain in left shoulder: Secondary | ICD-10-CM | POA: Diagnosis not present

## 2014-06-11 ENCOUNTER — Other Ambulatory Visit (INDEPENDENT_AMBULATORY_CARE_PROVIDER_SITE_OTHER): Payer: Medicare Other

## 2014-06-11 DIAGNOSIS — M1711 Unilateral primary osteoarthritis, right knee: Secondary | ICD-10-CM | POA: Diagnosis not present

## 2014-06-11 DIAGNOSIS — Z23 Encounter for immunization: Secondary | ICD-10-CM | POA: Diagnosis not present

## 2014-06-12 ENCOUNTER — Ambulatory Visit: Payer: Medicare Other | Admitting: Family Medicine

## 2014-06-23 ENCOUNTER — Encounter: Payer: Self-pay | Admitting: Medical

## 2014-06-23 ENCOUNTER — Ambulatory Visit (INDEPENDENT_AMBULATORY_CARE_PROVIDER_SITE_OTHER): Payer: Medicare Other | Admitting: Medical

## 2014-06-23 VITALS — BP 142/80 | HR 75 | Temp 98.2°F | Resp 14 | Wt 188.0 lb

## 2014-06-23 DIAGNOSIS — R42 Dizziness and giddiness: Secondary | ICD-10-CM | POA: Diagnosis not present

## 2014-06-23 DIAGNOSIS — Z8669 Personal history of other diseases of the nervous system and sense organs: Secondary | ICD-10-CM | POA: Diagnosis not present

## 2014-06-23 DIAGNOSIS — I10 Essential (primary) hypertension: Secondary | ICD-10-CM

## 2014-06-23 DIAGNOSIS — Z87898 Personal history of other specified conditions: Secondary | ICD-10-CM

## 2014-06-23 DIAGNOSIS — R270 Ataxia, unspecified: Secondary | ICD-10-CM | POA: Diagnosis not present

## 2014-06-23 LAB — COMPREHENSIVE METABOLIC PANEL
ALT: 18 U/L (ref 0–53)
AST: 19 U/L (ref 0–37)
Albumin: 4.3 g/dL (ref 3.5–5.2)
Alkaline Phosphatase: 71 U/L (ref 39–117)
BUN: 23 mg/dL (ref 6–23)
CALCIUM: 10 mg/dL (ref 8.4–10.5)
CHLORIDE: 103 meq/L (ref 96–112)
CO2: 24 mEq/L (ref 19–32)
CREATININE: 1.37 mg/dL — AB (ref 0.50–1.35)
Glucose, Bld: 89 mg/dL (ref 70–99)
Potassium: 4.3 mEq/L (ref 3.5–5.3)
Sodium: 136 mEq/L (ref 135–145)
Total Bilirubin: 0.6 mg/dL (ref 0.2–1.2)
Total Protein: 7.3 g/dL (ref 6.0–8.3)

## 2014-06-23 LAB — CBC
HCT: 42.7 % (ref 39.0–52.0)
HEMOGLOBIN: 14.5 g/dL (ref 13.0–17.0)
MCH: 30 pg (ref 26.0–34.0)
MCHC: 34 g/dL (ref 30.0–36.0)
MCV: 88.2 fL (ref 78.0–100.0)
MPV: 9.6 fL (ref 9.4–12.4)
Platelets: 254 10*3/uL (ref 150–400)
RBC: 4.84 MIL/uL (ref 4.22–5.81)
RDW: 13.7 % (ref 11.5–15.5)
WBC: 9 10*3/uL (ref 4.0–10.5)

## 2014-06-23 LAB — POCT URINALYSIS DIPSTICK
BILIRUBIN UA: NEGATIVE
GLUCOSE UA: NEGATIVE
Ketones, UA: NEGATIVE
Leukocytes, UA: NEGATIVE
NITRITE UA: NEGATIVE
RBC UA: NEGATIVE
Spec Grav, UA: 1.025
UROBILINOGEN UA: NEGATIVE
pH, UA: 6

## 2014-06-23 NOTE — Progress Notes (Signed)
Subjective: Here for dizziness.  His daughter brought him in.  He lives at home with his wife.  He notes hx/o vertigo in the past.   Feels this again.  He notes walking he can drift without realizing it.  This episode he notes x months.  He drifts when he walks often.   Used some of the medication Dr. Redmond School gave him prior (meclizine), but this doesn't seem to help.  Denies vision or hearing changes.   No URI symptoms.  No numbness or tingling . No chest pain, no dyspnea.   Has some urinary changes, some frequency, some hesitancy, gets up once at night to urinate.    Gets some urgency, has to go after going to urinate.  Bowels have been a little loose.  No other aggravating or relieving factors.    ROS as in subjective otherwise.  Past Medical History  Diagnosis Date  . ED (erectile dysfunction)   . Dyslipidemia   . Hypertension   . Arthritis      Objective: Filed Vitals:   06/23/14 1212  BP: 142/80  Pulse: 75  Temp:   Resp:     General appearance: alert, no distress, WD/WN HEENT: normocephalic, sclerae anicteric, PERRLA, EOMi, nares patent, no discharge or erythema, pharynx normal Oral cavity: MMM, no lesions Neck: supple, no lymphadenopathy, no thyromegaly, no masses, no bruits Heart: RRR, normal S1, S2, no murmurs Lungs: CTA bilaterally, no wheezes, rhonchi, or rales Abdomen: +bs, soft, non tender, non distended, no masses, no hepatomegaly, no splenomegaly Extremities: no edema, no cyanosis, no clubbing Pulses: 1+ symmetric, upper and lower extremities, normal cap refill Neurological: alert, oriented x 3, CN2-12 intact, strength normal upper extremities and lower extremities, sensation normal throughout, DTRs 2+ throughout, no cerebellar signs, gait normal Psychiatric: normal affect, behavior normal, pleasant     Assessment: Encounter Diagnoses  Name Primary?  . Dizziness and giddiness Yes  . History of vertigo   . Essential hypertension   . Ataxia     Plan: Orthostatics fine, BP a little elevated today, protein showing up in the urine.   Additional labs today.  C/t current medications.  Discussed case with Dr. Redmond School his [facility], if labs fine, refer to neurology for further eval of ongoing dizziness/ataxia.

## 2014-06-24 ENCOUNTER — Other Ambulatory Visit: Payer: Self-pay | Admitting: Family Medicine

## 2014-06-24 DIAGNOSIS — R42 Dizziness and giddiness: Secondary | ICD-10-CM

## 2014-06-24 DIAGNOSIS — R270 Ataxia, unspecified: Secondary | ICD-10-CM

## 2014-06-24 LAB — TSH: TSH: 2.231 u[IU]/mL (ref 0.350–4.500)

## 2014-06-24 LAB — MICROALBUMIN / CREATININE URINE RATIO
Creatinine, Urine: 130.2 mg/dL
MICROALB/CREAT RATIO: 557.6 mg/g — AB (ref 0.0–30.0)
Microalb, Ur: 72.6 mg/dL — ABNORMAL HIGH (ref <2.0)

## 2014-07-08 DIAGNOSIS — M1990 Unspecified osteoarthritis, unspecified site: Secondary | ICD-10-CM | POA: Diagnosis not present

## 2014-07-10 DIAGNOSIS — M1711 Unilateral primary osteoarthritis, right knee: Secondary | ICD-10-CM | POA: Diagnosis not present

## 2014-08-08 ENCOUNTER — Ambulatory Visit (INDEPENDENT_AMBULATORY_CARE_PROVIDER_SITE_OTHER): Payer: Medicare Other | Admitting: Neurology

## 2014-08-08 ENCOUNTER — Encounter: Payer: Self-pay | Admitting: Neurology

## 2014-08-08 VITALS — BP 146/82 | HR 70 | Temp 98.1°F | Resp 18 | Ht 65.0 in | Wt 189.5 lb

## 2014-08-08 DIAGNOSIS — R2681 Unsteadiness on feet: Secondary | ICD-10-CM

## 2014-08-08 DIAGNOSIS — R208 Other disturbances of skin sensation: Secondary | ICD-10-CM | POA: Diagnosis not present

## 2014-08-08 DIAGNOSIS — R200 Anesthesia of skin: Secondary | ICD-10-CM

## 2014-08-08 NOTE — Progress Notes (Signed)
NEUROLOGY CONSULTATION NOTE  Quintell Bonnin MRN: [id number] DOB: 07-03-1936  Referring provider: Chana Bode, PA-C Primary care provider: Jill Alexanders, MD  Reason for consult:  Dizziness, ataxia  HISTORY OF PRESENT ILLNESS: Brian Montgomery is a 79 year old right-handed man with hypertension, dyslipidemia, and arthritis who presents for longstanding history of vertigo.  Records and labs reviewed.  He is accompanied by his wife and daughter who provide some history.  For about 4 to 6 months, he has had increased problems with balance.  When he gets up, he has to take a moment to maintain his balance.  He sometimes feels has to push off.  When he is walking, he feels like his body is being pulled to either side.  There is no spinning sensation, lightheadedness, nausea, vomiting, slurred speech, visual disturbance or numbness or weakness.  He denies neck pain, low back pain or pain in the legs when he walks or stands.  He has longstanding problems with hearing but no tinnitus.  Orthostatics were checked and were negative.  He has had a couple of falls.  He was able to shovel his driveway last weekend without incident.    Labs from 06/23/14 include unremarkable CBC, CMP (including glucose of less than 100), and TSH 2.231.  He does have history of low back surgery and left knee replacement.  PAST MEDICAL HISTORY: Past Medical History  Diagnosis Date  . ED (erectile dysfunction)   . Dyslipidemia   . Hypertension   . Arthritis     PAST SURGICAL HISTORY: Past Surgical History  Procedure Laterality Date  . Left total knee  2011  . Colonoscopy  2007    Dr.kaplan    MEDICATIONS: Current Outpatient Prescriptions on File Prior to Visit  Medication Sig Dispense Refill  . aspirin 81 MG tablet Take 81 mg by mouth daily.    . fish oil-omega-3 fatty acids 1000 MG capsule Take 1 g by mouth daily.     Marland Kitchen lisinopril-hydrochlorothiazide (PRINZIDE,ZESTORETIC) 10-12.5 MG per tablet Take 1 tablet by  mouth daily. 90 tablet 3  . Multiple Vitamins-Minerals (MULTIVITAMIN WITH MINERALS) tablet Take 1 tablet by mouth daily.      . simvastatin (ZOCOR) 20 MG tablet Take 1 tablet (20 mg total) by mouth daily. 90 tablet 3  . tadalafil (CIALIS) 20 MG tablet Take 1 tablet (20 mg total) by mouth every other day as needed for erectile dysfunction. (Patient not taking: Reported on 08/08/2014) 3 tablet 0-   No current facility-administered medications on file prior to visit.    ALLERGIES: No Known Allergies  FAMILY HISTORY: Family History  Problem Relation Age of Onset  . Heart failure Mother   . Heart failure Father   . Hypertension Mother   . Hypertension Father   . Renal Disease Sister   . Diabetes Brother     SOCIAL HISTORY: History   Social History  . Marital Status: Married    Spouse Name: N/A    Number of Children: N/A  . Years of Education: N/A   Occupational History  . Not on file.   Social History Main Topics  . Smoking status: Former Research scientist (life sciences)  . Smokeless tobacco: Never Used  . Alcohol Use: 0.0 oz/week    0 Not specified per week     Comment: rare  . Drug Use: No  . Sexual Activity:    Partners: Female   Other Topics Concern  . Not on file   Social History Narrative    REVIEW  OF SYSTEMS: Constitutional: No fevers, chills, or sweats, no generalized fatigue, change in appetite Eyes: No visual changes, double vision, eye pain Ear, nose and throat: No hearing loss, ear pain, nasal congestion, sore throat Cardiovascular: No chest pain, palpitations Respiratory:  No shortness of breath at rest or with exertion, wheezes GastrointestinaI: No nausea, vomiting, diarrhea, abdominal pain, fecal incontinence Genitourinary:  No dysuria, urinary retention or frequency Musculoskeletal:  No neck pain, back pain Integumentary: No rash, pruritus, skin lesions Neurological: as above Psychiatric: No depression, insomnia, anxiety Endocrine: No palpitations, fatigue, diaphoresis,  mood swings, change in appetite, change in weight, increased thirst Hematologic/Lymphatic:  No anemia, purpura, petechiae. Allergic/Immunologic: no itchy/runny eyes, nasal congestion, recent allergic reactions, rashes  PHYSICAL EXAM: Filed Vitals:   08/08/14 1556  BP: 146/82  Pulse: 70  Temp: 98.1 F (36.7 C)  Resp: 18   General: No acute distress Head:  Normocephalic/atraumatic Eyes:  fundi unremarkable, without vessel changes, exudates, hemorrhages or papilledema. Neck: supple, no paraspinal tenderness, full range of motion Back: No paraspinal tenderness Heart: regular rate and rhythm Lungs: Clear to auscultation bilaterally. Vascular: No carotid bruits. Neurological Exam: Mental status: alert and oriented to person, place, and time, recent and remote memory intact, fund of knowledge intact, attention and concentration intact, speech fluent and not dysarthric, language intact. Cranial nerves: CN I: not tested CN II: pupils equal, round and reactive to light, visual fields intact, fundi unremarkable, without vessel changes, exudates, hemorrhages or papilledema. CN III, IV, VI:  full range of motion, no nystagmus, no ptosis CN V: facial sensation intact CN VII: upper and lower face symmetric CN VIII: hearing intact to voice CN IX, X: gag intact, uvula midline CN XI: sternocleidomastoid and trapezius muscles intact CN XII: tongue midline Bulk & Tone: normal, no fasciculations. Motor:  5/5 throughout Sensation:  Pinprick sensation intact.  Reduced vibration in the feet. Deep Tendon Reflexes:  2+ throughout except absent in ankles.  Toes downgoing Finger to nose testing:  No dysmetria Heel to shin:  No dysmetria Gait:  Mildly wide based gait, on occasion stumbles a bit.  Able to turn.  Cannot walk in tandem. Romberg negative but with mild sway.  IMPRESSION: Gait instability.  He doesn't endorse dizziness or vertigo.  He does have evidence of neuropathy in the feet.  He does not  have long-track signs to suggest stroke or myelopathy.  He does not have symptoms to suggest lumbar stenosis.  PLAN: 1.  Will check some blood work first, including B12, ANA, Sed Rate, SPEP and IFE. 2.  Will refer to PT 3.  Follow up in 2 months  Thank you for allowing me to take part in the care of this patient.  Metta Clines, DO  CC:  Jill Alexanders, MD  Chana Bode, PA-C

## 2014-08-08 NOTE — Patient Instructions (Signed)
The problem seems to be more of an issue with balance rather than dizziness.  I think it most likely is related to neuropathy in the feet. 1.  We will check some blood work 2.  We will refer you for physical therapy for gait. 3.  Follow up in 2 months to see if your balance is better.

## 2014-08-09 LAB — SEDIMENTATION RATE: Sed Rate: 7 mm/hr (ref 0–16)

## 2014-08-09 LAB — VITAMIN B12: Vitamin B-12: 519 pg/mL (ref 211–911)

## 2014-08-11 LAB — ANA: Anti Nuclear Antibody(ANA): NEGATIVE

## 2014-08-12 LAB — PROTEIN ELECTROPHORESIS, SERUM
ALPHA-2-GLOBULIN: 10.8 % (ref 7.1–11.8)
Albumin ELP: 58.8 % (ref 55.8–66.1)
Alpha-1-Globulin: 4.5 % (ref 2.9–4.9)
BETA 2: 4.4 % (ref 3.2–6.5)
BETA GLOBULIN: 6.6 % (ref 4.7–7.2)
Gamma Globulin: 14.9 % (ref 11.1–18.8)
Total Protein, Serum Electrophoresis: 7.3 g/dL (ref 6.0–8.3)

## 2014-08-12 LAB — IMMUNOFIXATION ELECTROPHORESIS
IgA: 121 mg/dL (ref 68–379)
IgG (Immunoglobin G), Serum: 1200 mg/dL (ref 650–1600)
IgM, Serum: 49 mg/dL (ref 41–251)
Total Protein, Serum Electrophoresis: 7.3 g/dL (ref 6.0–8.3)

## 2014-08-13 ENCOUNTER — Telehealth: Payer: Self-pay | Admitting: Neurology

## 2014-08-13 ENCOUNTER — Telehealth: Payer: Self-pay | Admitting: *Deleted

## 2014-08-13 NOTE — Telephone Encounter (Signed)
-----   Message from Dudley Major, DO sent at 08/13/2014  6:27 AM EST ----- Overall, the labs are unremarkable.  There is slight nonspecific abnormalities on one of the tests that look at proteins in the blood.  I recommend that we repeat these tests (SPEP and IFE) in 6 months. ----- Message -----    From: Lab in [facility] Interface    Sent: 08/12/2014   5:23 PM      To: Dudley Major, DO

## 2014-08-13 NOTE — Telephone Encounter (Signed)
Returning a call to [facility] regarding bloodwork. / Venida Jarvis

## 2014-08-13 NOTE — Telephone Encounter (Signed)
left message for MR Gusler to return call to office regarding labs  And he needs to repeat  spep ife  In 6 months

## 2014-08-14 ENCOUNTER — Telehealth: Payer: Self-pay | Admitting: *Deleted

## 2014-08-14 NOTE — Telephone Encounter (Signed)
patient is aware of  labs  we will repeat spep and IFE in 6 months

## 2014-08-19 NOTE — Telephone Encounter (Signed)
Pulled from separate telephone encounter -   patient is aware of labs we will repeat spep and IFE in 6 months (per telephone encounter entered by Susie V).  Encounter ready to close / Sherri S.

## 2014-08-26 ENCOUNTER — Other Ambulatory Visit: Payer: Self-pay | Admitting: Family Medicine

## 2014-08-28 ENCOUNTER — Ambulatory Visit: Payer: Medicare Other | Attending: Neurology | Admitting: Physical Therapy

## 2014-08-28 ENCOUNTER — Encounter: Payer: Self-pay | Admitting: Physical Therapy

## 2014-08-28 DIAGNOSIS — G629 Polyneuropathy, unspecified: Secondary | ICD-10-CM | POA: Insufficient documentation

## 2014-08-28 DIAGNOSIS — R269 Unspecified abnormalities of gait and mobility: Secondary | ICD-10-CM | POA: Diagnosis not present

## 2014-08-28 DIAGNOSIS — Z96652 Presence of left artificial knee joint: Secondary | ICD-10-CM | POA: Diagnosis not present

## 2014-08-29 NOTE — Therapy (Signed)
[facility] [address] [place], [place], [address] Phone: [phone]   Fax:  [phone]  Physical Therapy Evaluation  Patient Details  Name: Brian Montgomery MRN: [id number] Date of Birth: 08-31-1935 Referring Provider:  Denita Lung, MD  Encounter Date: 08/28/2014      PT End of Session - 08/28/14 2040    Visit Number 1  G1   Number of Visits 9   Date for PT Re-Evaluation 09/27/14   Authorization Type Medicare   Authorization Time Period 08-28-14 - 10-28-14   PT Start Time 1446   PT Stop Time 1535   PT Time Calculation (min) 49 min      Past Medical History  Diagnosis Date  . ED (erectile dysfunction)   . Dyslipidemia   . Hypertension   . Arthritis     Past Surgical History  Procedure Laterality Date  . Left total knee  2011  . Colonoscopy  2007    Dr.kaplan    There were no vitals taken for this visit.  Visit Diagnosis:  Abnormality of gait - Plan: PT plan of care cert/re-cert      Subjective Assessment - 08/28/14 2031    Symptoms Pt. states he has had vertigo or unsteadiness and balance problems for past 9 months; wife states MD initially diagnosed pt with having vertigo but neurologist diagnosed neuropathy and balance deficits rather than true vertigo ; pt. denies true spinning vertigo at this time   Pertinent History Neuropathy: s/p L TKR approx. 6 years ago   Diagnostic tests MRI   Patient Stated Goals Improve balance and steadiness with walking   Currently in Pain? No/denies          [facility] PT Assessment - 08/28/14 1453    Assessment   Medical Diagnosis Neuropathy;  Gait abnormality   Onset Date --  early summer 2015   Balance Screen   Has the patient fallen in the past 6 months Yes   How many times? 2   Has the patient had a decrease in activity level because of a fear of falling?  No   Is the patient reluctant to leave their home because of a fear of falling?  No   Home  Environment   Type of [place] Access Stairs to enter  2 steps to enter   Entrance Stairs-Number of Steps 2   Entrance Stairs-Rails None   Home Layout One level   Prior Function   Level of Independence Independent with homemaking with ambulation   Strength   Overall Strength Within functional limits for tasks performed   Right Ankle Dorsiflexion 4-/5   Right Ankle Plantar Flexion 2+/5   Left Ankle Dorsiflexion 5/5   Left Ankle Plantar Flexion 2+/5   Ambulation/Gait   Ambulation/Gait Yes   Ambulation Distance (Feet) 100 Feet   Assistive device None   Gait Pattern Lateral trunk lean to left;Trunk flexed  occasional slight shuffling of feet   Gait velocity 3.05  no device   Berg Balance Test   Sit to Stand Able to stand without using hands and stabilize independently   Standing Unsupported Able to stand safely 2 minutes   Sitting with Back Unsupported but Feet Supported on Floor or Stool Able to sit safely and securely 2 minutes   Stand to Sit Sits safely with minimal use of hands   Transfers Able to transfer safely, minor use of hands   Standing Unsupported with Eyes Closed Able  to stand 10 seconds safely   Standing Ubsupported with Feet Together Able to place feet together independently and stand for 1 minute with supervision   From Standing, Reach Forward with Outstretched Arm Can reach confidently >25 cm (10")   From Standing Position, Pick up Object from [place] to pick up shoe safely and easily   From Standing Position, Turn to Look Behind Over each Shoulder Looks behind from both sides and weight shifts well   Turn 360 Degrees Able to turn 360 degrees safely but slowly   Standing Unsupported, Alternately Place Feet on Step/Stool Able to stand independently and complete 8 steps >20 seconds   Standing Unsupported, One Foot in [place] to plae foot ahead of the other independently and hold 30 seconds   Standing on One Leg Tries to lift leg/unable to hold 3 seconds  but remains standing independently   Total Score 48   Timed Up and Go Test   Normal TUG (seconds) 12.72                          PT Education - 08/28/14 2040    Education provided Yes   Education Details single limb stance and partial tandem stance   Person(s) Educated Patient;Spouse   Methods Explanation;Demonstration;Handout   Comprehension Verbalized understanding;Returned demonstration          PT Short Term Goals - 08/28/14 2045    PT SHORT TERM GOAL #1   Title same as LTG's            PT Long Term Goals - 08/29/14 0957    PT LONG TERM GOAL #1   Title Increase Berg balance test score to >/= 52/56 to demo improved balance and decr. fall risk.   Baseline 48/56         target date 09-27-14   Time 4   Period Weeks   Status New   PT LONG TERM GOAL #2   Title Increase gait velocity to >/= 3.5 ft/sec with no device for incr. gait efficiency.   Baseline 3.04 ft/sec    target date 09-27-14   Time 4   Period Weeks   Status New   PT LONG TERM GOAL #3   Title Pt. will report feeling at least 50% improvement with balance and steadiness with ambulation, including turns.   Baseline target date 09-27-14   Time 4   Period Weeks   Status New   PT LONG TERM GOAL #4   Title Independent in HEP for balance and strengthening exercises.   Baseline target date 09-27-14   Time 4   Period Weeks   Status New               Plan - 08/28/14 2041    Clinical Impression Statement Pt.'s static standing balance is good with exception of high level balance skills, i.e single limb stance and tandem stance; pt. has more difficulty with dynamic standing balance   Pt will benefit from skilled therapeutic intervention in order to improve on the following deficits Abnormal gait;Decreased balance;Decreased strength   Rehab Potential Good   PT Frequency 2x / week   PT Duration 4 weeks   PT Treatment/Interventions ADLs/Self Care Home Management;Therapeutic  activities;Patient/family education;Therapeutic exercise;Gait training;Balance training;Stair training;Neuromuscular re-education   PT Next Visit Plan add to balance HEP and add heel raises for strengthening; do DGI   PT Home Exercise Plan see above   Consulted and Agree with Plan  of Care Patient;Family member/caregiver          G-Codes - 09-25-14 1005    Functional Assessment Tool Used Berg score 48/56;   TUG 12.6 secs;  gait velocity 3.05 ft/sec with no device   Functional Limitation Mobility: Walking and moving around   Mobility: Walking and Moving Around Current Status [phone]) At least 20 percent but less than 40 percent impaired, limited or restricted   Mobility: Walking and Moving Around Goal Status [phone]) At least 1 percent but less than 20 percent impaired, limited or restricted       Problem List Patient Active Problem List   Diagnosis Date Noted  . Hyperlipidemia LDL goal < 100 08/06/2013  . Essential hypertension, benign 09/17/2012  . Hematuria 09/17/2012    Alda Lea, PT 09/25/14, 10:11 AM  [facility] [address] [place], [place], [address] Phone: [phone]   Fax:  [phone]

## 2014-09-02 ENCOUNTER — Ambulatory Visit: Payer: Medicare Other | Admitting: Physical Therapy

## 2014-09-02 ENCOUNTER — Encounter: Payer: Self-pay | Admitting: Physical Therapy

## 2014-09-02 DIAGNOSIS — Z96652 Presence of left artificial knee joint: Secondary | ICD-10-CM | POA: Diagnosis not present

## 2014-09-02 DIAGNOSIS — R269 Unspecified abnormalities of gait and mobility: Secondary | ICD-10-CM | POA: Diagnosis not present

## 2014-09-02 DIAGNOSIS — G629 Polyneuropathy, unspecified: Secondary | ICD-10-CM | POA: Diagnosis not present

## 2014-09-02 NOTE — Patient Instructions (Signed)
"  I love a Art therapist for support. March in place alternating legs. Repeat 10 times. Do 1 sessions per day.  http://gt2.exer.us/344   Copyright  VHI. All rights reserved.   AMBULATION: Side Step   Step sideways. Repeat in opposite direction. 8 reps per set, 1 sets per day.  Hold counter for support.  Copyright  VHI. All rights reserved.     Copyright  VHI. All rights reserved.   Hip Extension (Standing)   Stand with support. Squeeze pelvic floor and hold. Move right leg backward with straight knee.  Repeat 10 times. Do 1 times a day. Repeat with other leg.    Copyright  VHI. All rights reserved.   Ankle Plantar Flexion / Dorsiflexion, Standing   Stand while holding a stable object. Rise up on toes. Then rock back on heels.  Repeat 10 times per session. Do 1 sessions per day.  Copyright  VHI. All rights reserved.

## 2014-09-02 NOTE — Therapy (Signed)
[facility] [address] [place], [place], [address] Phone: [phone]   Fax:  [phone]  Physical Therapy Treatment  Patient Details  Name: Brian Montgomery MRN: [id number] Date of Birth: 01/04/1936 Referring Provider:  Denita Lung, MD  Encounter Date: 09/02/2014      PT End of Session - 09/02/14 1103    Visit Number 2   Number of Visits 9   Date for PT Re-Evaluation 09/27/14   Authorization Type Medicare   Authorization Time Period 08-28-14 - 10-28-14   PT Start Time 0850   PT Stop Time 0933   PT Time Calculation (min) 43 min   Equipment Utilized During Treatment Gait belt   Behavior During Therapy [facility] for tasks assessed/performed      Past Medical History  Diagnosis Date  . ED (erectile dysfunction)   . Dyslipidemia   . Hypertension   . Arthritis     Past Surgical History  Procedure Laterality Date  . Left total knee  2011  . Colonoscopy  2007    Dr.kaplan    There were no vitals taken for this visit.  Visit Diagnosis:  Abnormality of gait      Subjective Assessment - 09/02/14 1050    Symptoms Pt states he [place] the mall and was able to step on/off escalator going up but unable going down-froze in place.  Daughter feels like he got scared trying to go down.   Currently in Pain? No/denies                    [facility] Adult PT Treatment/Exercise - 09/02/14 0908    Transfers   Transfers Sit to Stand;Stand to Sit   Sit to Stand 5: Supervision   Sit to Stand Details (indicate cue type and reason) pushes posteriorly against mat, unable to correct with cuest   Stand to Sit 5: Supervision   Stand to Sit Details cues to control descent   Ambulation/Gait   Ambulation/Gait Yes   Ambulation Distance (Feet) 100 Feet  twice   Assistive device None   Gait Pattern Lateral trunk lean to left;Trunk flexed   Dynamic Gait Index   Level Surface Mild Impairment   Change in Gait Speed Mild  Impairment   Gait with Horizontal Head Turns Moderate Impairment   Gait with Vertical Head Turns Moderate Impairment   Gait and Pivot Turn Mild Impairment   Step Over Obstacle Mild Impairment   Step Around Obstacles Mild Impairment   Steps Mild Impairment   Total Score 14   Knee/Hip Exercises: Standing   Other Standing Knee Exercises in parallel bars-alternating hip extension, marching, abd x 10 reps;sidestepping in parallel bars 8' x 6   Ankle Exercises: Standing   Heel Raises 10 reps   Toe Raise 10 reps                PT Education - 09/02/14 1102    Education provided Yes   Education Details HEP   Person(s) Educated Patient;Child(ren)   Methods Explanation;Demonstration;Verbal cues;Handout   Comprehension Verbalized understanding;Need further instruction          PT Short Term Goals - 08/28/14 2045    PT SHORT TERM GOAL #1   Title same as LTG's            PT Long Term Goals - 08/29/14 0957    PT LONG TERM GOAL #1   Title Increase Berg balance test score to >/= 52/56 to demo  improved balance and decr. fall risk.   Baseline 48/56         target date 09-27-14   Time 4   Period Weeks   Status New   PT LONG TERM GOAL #2   Title Increase gait velocity to >/= 3.5 ft/sec with no device for incr. gait efficiency.   Baseline 3.04 ft/sec    target date 09-27-14   Time 4   Period Weeks   Status New   PT LONG TERM GOAL #3   Title Pt. will report feeling at least 50% improvement with balance and steadiness with ambulation, including turns.   Baseline target date 09-27-14   Time 4   Period Weeks   Status New   PT LONG TERM GOAL #4   Title Independent in HEP for balance and strengthening exercises.   Baseline target date 09-27-14   Time 4   Period Weeks   Status New               Plan - 09/02/14 1104    Clinical Impression Statement Pt had difficulty coordinating exercises today and needed max verbal cues and mod tactile cues.  Pt's goal is to improve  balance so he can attend a car show in [facility] March 18th.  Continue PT per POC>   Pt will benefit from skilled therapeutic intervention in order to improve on the following deficits Abnormal gait;Decreased balance;Decreased strength   Rehab Potential Good   PT Frequency 2x / week   PT Duration 4 weeks   PT Treatment/Interventions ADLs/Self Care Home Management;Therapeutic activities;Patient/family education;Therapeutic exercise;Gait training;Balance training;Stair training;Neuromuscular re-education   PT Next Visit Plan Review HEP.  Sit to stand without posterior pushing.  Continue L LE strengthening, balance and coordination activities.   Consulted and Agree with Plan of Care Patient;Family member/caregiver   Family Member Consulted daughter        Problem List Patient Active Problem List   Diagnosis Date Noted  . Hyperlipidemia LDL goal < 100 08/06/2013  . Essential hypertension, benign 09/17/2012  . Hematuria 09/17/2012    Narda Bonds 09/02/2014, 12:59 PM  [facility] [address] [place], [place], [address] Phone: [phone]   Fax:  [place], [place] 09/02/2014 12:59 PM Phone: [phone] Fax: [phone]

## 2014-09-03 ENCOUNTER — Ambulatory Visit: Payer: Medicare Other | Admitting: Physical Therapy

## 2014-09-03 DIAGNOSIS — R269 Unspecified abnormalities of gait and mobility: Secondary | ICD-10-CM | POA: Diagnosis not present

## 2014-09-03 DIAGNOSIS — G629 Polyneuropathy, unspecified: Secondary | ICD-10-CM | POA: Diagnosis not present

## 2014-09-03 DIAGNOSIS — Z96652 Presence of left artificial knee joint: Secondary | ICD-10-CM | POA: Diagnosis not present

## 2014-09-04 ENCOUNTER — Encounter: Payer: Self-pay | Admitting: Physical Therapy

## 2014-09-04 NOTE — Therapy (Signed)
[facility] [address] [place], [place], [address] Phone: [phone]   Fax:  [phone]  Physical Therapy Treatment  Patient Details  Name: Rilan Eiland MRN: [id number] Date of Birth: Nov 21, 1935 Referring Provider:  Denita Lung, MD  Encounter Date: 09/03/2014      PT End of Session - 09/04/14 1118    Visit Number 3  G3   Number of Visits 9   Date for PT Re-Evaluation 09/27/14   Authorization Type Medicare   Authorization Time Period 08-28-14 - 10-28-14   PT Start Time 1019   PT Stop Time 1103   PT Time Calculation (min) 44 min      Past Medical History  Diagnosis Date  . ED (erectile dysfunction)   . Dyslipidemia   . Hypertension   . Arthritis     Past Surgical History  Procedure Laterality Date  . Left total knee  2011  . Colonoscopy  2007    Dr.kaplan    There were no vitals taken for this visit.  Visit Diagnosis:  Abnormality of gait      Subjective Assessment - 09/04/14 1114    Symptoms Pt. reports no changes/falls since last visit; says RLE is no longer painful and he is able to lift it up into vehicle without pain   Pertinent History Neuropathy: s/p L TKR approx. 6 years ago   Diagnostic tests MRI   Patient Stated Goals Improve balance and steadiness with walking   Currently in Pain? No/denies                 [facility] Adult PT Treatment/Exercise - 09/04/14 0001    Ambulation/Gait   Ambulation/Gait Yes   Ambulation Distance (Feet) 360 Feet   Assistive device None   Gait Pattern Lateral trunk lean to left;Trunk flexed   Ankle Exercises: Standing   Heel Raises 10 reps   Transfers   Sit to Stand Details Tactile cues for weight shifting          Gait; heel lift placed in R shoe after leg length assessed in supine position;  LLE slightly longer than RLE; however, heel lift Did not significantly improve gait; pt. Gait trained 120' with heel lift and then 240' without heel  lift with cues for posture and step  Length TherEx; L hip abduction in right sidelying position - 2 sets 10 reps with 3# weight; L clamshell x 20 reps with 5# weight; R hip  Abduction in L sidelying position with 3# weight x 20 reps; clamshell RLE with 5# weight x 20 reps Heel raises x 20 reps bil. ; attempted 10 reps each LE single limb heel raises but very difficult for pt.  NeuroRe-ed: sit to stand from mat without UE support x 10 reps with tactile cues to lean anteriorly as pt. Leans back on his heels and  Loses balance posteriorly Rockerboard ant./posteriorly x 20 reps to fascilitate anterior weight shift;  Tap ups to 1st, 2nd step without UE support with CGA            PT Short Term Goals - 08/28/14 2045    PT SHORT TERM GOAL #1   Title same as LTG's            PT Long Term Goals - 08/29/14 0957    PT LONG TERM GOAL #1   Title Increase Berg balance test score to >/= 52/56 to demo improved balance and decr. fall risk.   Baseline 48/56  target date 09-27-14   Time 4   Period Weeks   Status New   PT LONG TERM GOAL #2   Title Increase gait velocity to >/= 3.5 ft/sec with no device for incr. gait efficiency.   Baseline 3.04 ft/sec    target date 09-27-14   Time 4   Period Weeks   Status New   PT LONG TERM GOAL #3   Title Pt. will report feeling at least 50% improvement with balance and steadiness with ambulation, including turns.   Baseline target date 09-27-14   Time 4   Period Weeks   Status New   PT LONG TERM GOAL #4   Title Independent in HEP for balance and strengthening exercises.   Baseline target date 09-27-14   Time 4   Period Weeks   Status New               Plan - 09/04/14 1119    Clinical Impression Statement Heel lift placed in R shoe did not significantly improve gait; pt. has slight leg length discrepancy with LLE longer than RLE but lateral weight shift may be due to L hip abductor weakness   Pt will benefit from skilled therapeutic  intervention in order to improve on the following deficits Abnormal gait;Decreased balance;Decreased strength   Rehab Potential Good   PT Frequency 2x / week   PT Duration 4 weeks   PT Treatment/Interventions ADLs/Self Care Home Management;Therapeutic activities;Patient/family education;Therapeutic exercise;Gait training;Balance training;Stair training;Neuromuscular re-education   PT Next Visit Plan Continue with bil. hip abductor strengthening; trial heel lift in L shoe to see if gait is improved with less lateral trunk lean   PT Home Exercise Plan see above   Consulted and Agree with Plan of Care Patient        Problem List Patient Active Problem List   Diagnosis Date Noted  . Hyperlipidemia LDL goal < 100 08/06/2013  . Essential hypertension, benign 09/17/2012  . Hematuria 09/17/2012    Alda Lea, PT 09/04/2014, 11:35 AM  [facility] [address] [place], [place], [address] Phone: [phone]   Fax:  [phone]

## 2014-09-08 ENCOUNTER — Ambulatory Visit: Payer: Medicare Other | Admitting: Physical Therapy

## 2014-09-08 ENCOUNTER — Encounter: Payer: Self-pay | Admitting: Physical Therapy

## 2014-09-08 DIAGNOSIS — R269 Unspecified abnormalities of gait and mobility: Secondary | ICD-10-CM | POA: Diagnosis not present

## 2014-09-08 DIAGNOSIS — Z96652 Presence of left artificial knee joint: Secondary | ICD-10-CM | POA: Diagnosis not present

## 2014-09-08 DIAGNOSIS — G629 Polyneuropathy, unspecified: Secondary | ICD-10-CM | POA: Diagnosis not present

## 2014-09-08 NOTE — Therapy (Signed)
[facility] [address] [place], [place], [address] Phone: [phone]   Fax:  [phone]  Physical Therapy Treatment  Patient Details  Name: Brian Montgomery MRN: [id number] Date of Birth: Dec 02, 1935 Referring Provider:  Denita Lung, MD  Encounter Date: 09/08/2014      PT End of Session - 09/08/14 0849    Visit Number 4   Number of Visits 9   Date for PT Re-Evaluation 09/27/14   Authorization Type Medicare   Authorization Time Period 08-28-14 - 10-28-14   PT Start Time 0804   PT Stop Time 0845   PT Time Calculation (min) 41 min   Behavior During Therapy [facility] for tasks assessed/performed      Past Medical History  Diagnosis Date  . ED (erectile dysfunction)   . Dyslipidemia   . Hypertension   . Arthritis     Past Surgical History  Procedure Laterality Date  . Left total knee  2011  . Colonoscopy  2007    Dr.kaplan    There were no vitals taken for this visit.  Visit Diagnosis:  Abnormality of gait      Subjective Assessment - 09/08/14 0806    Symptoms Denies falls or changes.   Currently in Pain? No/denies                    [facility] Adult PT Treatment/Exercise - 09/08/14 0001    Ambulation/Gait   Ambulation/Gait Yes   Ambulation/Gait Assistance 5: Supervision   Ambulation/Gait Assistance Details trialed 1/2" lift and 3/8" lift in L shoe; gait improved with lift but pt reports he feels like lift pushes him forward.  With 1/2' lift pt did have decreased heel clearance on L.   Ambulation Distance (Feet) 440 Feet  twice   Assistive device None   Gait Pattern Lateral trunk lean to left;Trunk flexed;Lateral hip instability   Knee/Hip Exercises: Standing   Lateral Step Up Both;10 reps;Hand Hold: 2;Step Height: 6"   Forward Step Up Both;10 reps;Hand Hold: 2;Step Height: 6"   Knee/Hip Exercises: Supine   Bridges Both;20 reps   Other Supine Knee Exercises hooklying hip abduction on L LE then  R LE with green theraband x 15 x 2   Knee/Hip Exercises: Sidelying   Hip ABduction Left;2 sets;10 reps;Other (comment)  3# weight   Clams x 15 x 2 with 4# L LE                  PT Short Term Goals - 08/28/14 2045    PT SHORT TERM GOAL #1   Title same as LTG's            PT Long Term Goals - 08/29/14 0957    PT LONG TERM GOAL #1   Title Increase Berg balance test score to >/= 52/56 to demo improved balance and decr. fall risk.   Baseline 48/56         target date 09-27-14   Time 4   Period Weeks   Status New   PT LONG TERM GOAL #2   Title Increase gait velocity to >/= 3.5 ft/sec with no device for incr. gait efficiency.   Baseline 3.04 ft/sec    target date 09-27-14   Time 4   Period Weeks   Status New   PT LONG TERM GOAL #3   Title Pt. will report feeling at least 50% improvement with balance and steadiness with ambulation, including turns.   Baseline target date  09-27-14   Time 4   Period Weeks   Status New   PT LONG TERM GOAL #4   Title Independent in HEP for balance and strengthening exercises.   Baseline target date 09-27-14   Time 4   Period Weeks   Status New               Plan - 09/08/14 1303    Clinical Impression Statement Pt's gait appeared improved with L heel lift but pt did not like how the lift felt in the shoe and felt like it pushed him forward.  Continue PT per POC.   Pt will benefit from skilled therapeutic intervention in order to improve on the following deficits Abnormal gait;Decreased balance;Decreased strength   Rehab Potential Good   PT Frequency 2x / week   PT Duration 4 weeks   PT Treatment/Interventions ADLs/Self Care Home Management;Therapeutic activities;Patient/family education;Therapeutic exercise;Gait training;Balance training;Stair training;Neuromuscular re-education   PT Next Visit Plan Try L shoe lift undernear insert/pad in shoe.  Continue bil hip abductor strengthening.   Consulted and Agree with Plan of Care  Patient;Family member/caregiver   Family Member Consulted grandson        Problem List Patient Active Problem List   Diagnosis Date Noted  . Hyperlipidemia LDL goal < 100 08/06/2013  . Essential hypertension, benign 09/17/2012  . Hematuria 09/17/2012    Narda Bonds 09/08/2014, 1:05 PM  [facility] [address] [place], [place], [address] Phone: [phone]   Fax:  [place], [place] 09/08/2014 1:05 PM Phone: [phone] Fax: [phone]

## 2014-09-09 ENCOUNTER — Ambulatory Visit: Payer: Medicare Other | Attending: Neurology | Admitting: Physical Therapy

## 2014-09-09 DIAGNOSIS — G629 Polyneuropathy, unspecified: Secondary | ICD-10-CM | POA: Insufficient documentation

## 2014-09-09 DIAGNOSIS — Z96652 Presence of left artificial knee joint: Secondary | ICD-10-CM | POA: Diagnosis not present

## 2014-09-09 DIAGNOSIS — R269 Unspecified abnormalities of gait and mobility: Secondary | ICD-10-CM | POA: Insufficient documentation

## 2014-09-10 ENCOUNTER — Encounter: Payer: Self-pay | Admitting: Physical Therapy

## 2014-09-10 NOTE — Therapy (Signed)
[facility] [address] [place], [place], [address] Phone: [phone]   Fax:  [phone]  Physical Therapy Treatment  Patient Details  Name: Brian Montgomery MRN: [id number] Date of Birth: 04-19-1936 Referring Provider:  Denita Lung, MD  Encounter Date: 09/09/2014      PT End of Session - 09/10/14 0922    Visit Number 5  G5   Number of Visits 9   Date for PT Re-Evaluation 09/27/14   Authorization Type Medicare   Authorization Time Period 08-28-14 - 10-28-14   PT Start Time 1100   PT Stop Time 1146   PT Time Calculation (min) 46 min      Past Medical History  Diagnosis Date  . ED (erectile dysfunction)   . Dyslipidemia   . Hypertension   . Arthritis     Past Surgical History  Procedure Laterality Date  . Left total knee  2011  . Colonoscopy  2007    Dr.kaplan    There were no vitals taken for this visit.  Visit Diagnosis:  Abnormality of gait      Subjective Assessment - 09/10/14 0919    Symptoms Pt. states he feels that his balance is a little better   Pertinent History Neuropathy: s/p L TKR approx. 6 years ago   Patient Stated Goals Improve balance and steadiness with walking   Currently in Pain? No/denies                    [facility] Adult PT Treatment/Exercise - 09/10/14 0001    Ambulation/Gait   Ambulation/Gait Yes   Ambulation/Gait Assistance 5: Supervision   Ambulation/Gait Assistance Details trialed 3/8" lift in L shoe 120' with no device - pt. does not like the way it feels; removed wedge with pt. amb. 120' for comparison of gait pattern   Ambulation Distance (Feet) 360 Feet   Assistive device None   Gait Pattern Lateral trunk lean to left;Trunk flexed;Lateral hip instability   Ankle Exercises: Standing   Heel Raises 10 reps     Neuro Re-ed; single limb stance activities on floor and on blue foam with marching, tap downs to floor; alternate tap ups  To 1st and 2nd step with  UE support prn with CGA;  rockerboard anteriorly and posteriorly with UE support prn and with  Head turns; rockerboard laterally with head turns with UE support prn with CGA; standing on foam - performing slow forward Kicks with UE support for single limb stance on compliant surface   Gait; step training - with 2 rails with S, then with 1 rail step over step; attempted step negotiation without hand rail step By step sequence with SBA - pt. Fearful of falling and hesitant to try without holding rail; did ascend 4 steps using a  Step by step sequence without use of rails with SBA 120' with no device with cues for upright posture and incr. Step length and incr. Initial heel strike          PT Short Term Goals - 08/28/14 2045    PT SHORT TERM GOAL #1   Title same as LTG's            PT Long Term Goals - 08/29/14 0957    PT LONG TERM GOAL #1   Title Increase Berg balance test score to >/= 52/56 to demo improved balance and decr. fall risk.   Baseline 48/56         target date 09-27-14  Time 4   Period Weeks   Status New   PT LONG TERM GOAL #2   Title Increase gait velocity to >/= 3.5 ft/sec with no device for incr. gait efficiency.   Baseline 3.04 ft/sec    target date 09-27-14   Time 4   Period Weeks   Status New   PT LONG TERM GOAL #3   Title Pt. will report feeling at least 50% improvement with balance and steadiness with ambulation, including turns.   Baseline target date 09-27-14   Time 4   Period Weeks   Status New   PT LONG TERM GOAL #4   Title Independent in HEP for balance and strengthening exercises.   Baseline target date 09-27-14   Time 4   Period Weeks   Status New               Plan - 09/10/14 7673    Clinical Impression Statement Pt. continues to decline heel wedge in L shoe - does not like way it feels and states he prefers not to use it; will proceed with gait training without use of wedge and focus on balance retraining   Pt will benefit from  skilled therapeutic intervention in order to improve on the following deficits Abnormal gait;Decreased balance;Decreased strength   Rehab Potential Good   PT Frequency 2x / week   PT Duration 4 weeks   PT Treatment/Interventions ADLs/Self Care Home Management;Therapeutic activities;Patient/family education;Therapeutic exercise;Gait training;Balance training;Stair training;Neuromuscular re-education   PT Next Visit Plan cont hip abductor strengthening and balance training   PT Home Exercise Plan Balance HEP as previously instructed        Problem List Patient Active Problem List   Diagnosis Date Noted  . Hyperlipidemia LDL goal < 100 08/06/2013  . Essential hypertension, benign 09/17/2012  . Hematuria 09/17/2012    Alda Lea, PT 09/10/2014, 9:28 AM  [facility] [address] [place], [place], [address] Phone: [phone]   Fax:  [phone]

## 2014-09-15 ENCOUNTER — Encounter: Payer: Self-pay | Admitting: Physical Therapy

## 2014-09-15 ENCOUNTER — Ambulatory Visit: Payer: Medicare Other | Admitting: Physical Therapy

## 2014-09-15 DIAGNOSIS — R269 Unspecified abnormalities of gait and mobility: Secondary | ICD-10-CM | POA: Diagnosis not present

## 2014-09-15 DIAGNOSIS — G629 Polyneuropathy, unspecified: Secondary | ICD-10-CM | POA: Diagnosis not present

## 2014-09-15 DIAGNOSIS — Z96652 Presence of left artificial knee joint: Secondary | ICD-10-CM | POA: Diagnosis not present

## 2014-09-15 NOTE — Therapy (Signed)
[facility] [address] [place], [place], [address] Phone: [phone]   Fax:  [phone]  Physical Therapy Treatment  Patient Details  Name: Brian Montgomery MRN: [id number] Date of Birth: 10/15/1935 Referring Provider:  Denita Lung, MD  Encounter Date: 09/15/2014      PT End of Session - 09/15/14 1835    Visit Number 6  G6   Number of Visits 9   Date for PT Re-Evaluation 09/27/14   Authorization Type Medicare   Authorization Time Period 08-28-14 - 10-28-14   PT Start Time 0931   PT Stop Time 1025   PT Time Calculation (min) 54 min      Past Medical History  Diagnosis Date  . ED (erectile dysfunction)   . Dyslipidemia   . Hypertension   . Arthritis     Past Surgical History  Procedure Laterality Date  . Left total knee  2011  . Colonoscopy  2007    Dr.kaplan    There were no vitals taken for this visit.  Visit Diagnosis:  Abnormality of gait      Subjective Assessment - 09/15/14 1827    Symptoms Pt. reports no problems - "still working on my balance"   Pertinent History Neuropathy: s/p L TKR approx. 6 years ago   Diagnostic tests MRI   Patient Stated Goals Improve balance and steadiness with walking   Currently in Pain? No/denies                    [facility] Adult PT Treatment/Exercise - 09/15/14 0001    Transfers   Sit to Stand 5: Supervision   Sit to Stand Details (indicate cue type and reason) occasional use of mat to stabilize   Ambulation/Gait   Ambulation/Gait Yes   Ambulation/Gait Assistance 5: Supervision   Ambulation Distance (Feet) 120 Feet   Assistive device None   Gait Pattern Lateral trunk lean to left;Trunk flexed;Lateral hip instability   Ambulation Surface Level   Stairs Yes   Stairs Assistance 5: Supervision   Stair Management Technique Forwards;Step to pattern;Two rails   Number of Stairs 4   Height of Stairs 6   Ramp 5: Supervision   High Level Balance   High Level Balance Activities Braiding;Marching forwards  with CGA   Exercises   Exercises Knee/Hip   Knee/Hip Exercises: Aerobic   Stationary Bike Scifit level 1.5 x 4" ; level 2.6 x 1"  5" total   Knee/Hip Exercises: Standing   Heel Raises 10 reps   Rocker Board 1 minute   Other Standing Knee Exercises bil. hip extension and abduction with green theraband x 10 reps each     NeuroRe-ed; alternate tap ups to 1st and 2nd steps without UE support with CGA:  Stepping over and back of 1/2 Bolster on floor without UE support with cues to stay facing forward rather than rotating body sideways           PT Education - 09/15/14 1835    Education provided Yes   Education Details braiding for balance   Person(s) Educated Patient   Methods Explanation;Demonstration;Handout   Comprehension Verbalized understanding;Returned demonstration          PT Short Term Goals - 08/28/14 2045    PT SHORT TERM GOAL #1   Title same as LTG's            PT Long Term Goals - 08/29/14 0957    PT LONG TERM GOAL #1  Title Increase Berg balance test score to >/= 52/56 to demo improved balance and decr. fall risk.   Baseline 48/56         target date 09-27-14   Time 4   Period Weeks   Status New   PT LONG TERM GOAL #2   Title Increase gait velocity to >/= 3.5 ft/sec with no device for incr. gait efficiency.   Baseline 3.04 ft/sec    target date 09-27-14   Time 4   Period Weeks   Status New   PT LONG TERM GOAL #3   Title Pt. will report feeling at least 50% improvement with balance and steadiness with ambulation, including turns.   Baseline target date 09-27-14   Time 4   Period Weeks   Status New   PT LONG TERM GOAL #4   Title Independent in HEP for balance and strengthening exercises.   Baseline target date 09-27-14   Time 4   Period Weeks   Status New               Plan - 09/15/14 1836    Clinical Impression Statement Pt.'s balance appears to be improving - cont. to  demonstrate decr. high level balance skills   Pt will benefit from skilled therapeutic intervention in order to improve on the following deficits Abnormal gait;Decreased balance;Decreased strength   Rehab Potential Good   PT Frequency 2x / week   PT Duration 4 weeks   PT Treatment/Interventions ADLs/Self Care Home Management;Therapeutic activities;Patient/family education;Therapeutic exercise;Gait training;Balance training;Stair training;Neuromuscular re-education   PT Next Visit Plan cont. high level balance and gait training; check braiding ex. added to HEP   PT Home Exercise Plan Balance HEP as previously instructed   Consulted and Agree with Plan of Care Patient        Problem List Patient Active Problem List   Diagnosis Date Noted  . Hyperlipidemia LDL goal < 100 08/06/2013  . Essential hypertension, benign 09/17/2012  . Hematuria 09/17/2012    Alda Lea, PT 09/15/2014, 6:43 PM  [place] [place] [address] [place], [place], [address] Phone: [phone]   Fax:  [phone]

## 2014-09-15 NOTE — Patient Instructions (Signed)
Braiding Walking   Step forward crossing one leg over the other. Alternate legs. Perform ___ laps. Perform ___ laps stepping backward.  Copyright  VHI. All rights reserved.

## 2014-09-17 ENCOUNTER — Encounter: Payer: Self-pay | Admitting: Physical Therapy

## 2014-09-17 ENCOUNTER — Ambulatory Visit: Payer: Medicare Other | Admitting: Physical Therapy

## 2014-09-17 DIAGNOSIS — Z96652 Presence of left artificial knee joint: Secondary | ICD-10-CM | POA: Diagnosis not present

## 2014-09-17 DIAGNOSIS — R269 Unspecified abnormalities of gait and mobility: Secondary | ICD-10-CM | POA: Diagnosis not present

## 2014-09-17 DIAGNOSIS — G629 Polyneuropathy, unspecified: Secondary | ICD-10-CM | POA: Diagnosis not present

## 2014-09-17 NOTE — Therapy (Signed)
[facility] [address] [place], [place], [address] Phone: [phone]   Fax:  [phone]  Physical Therapy Treatment  Patient Details  Name: Brian Montgomery MRN: [id number] Date of Birth: Oct 14, 1935 Referring Provider:  Denita Lung, MD  Encounter Date: 09/17/2014      PT End of Session - 09/17/14 1722    Visit Number 7  G7   Number of Visits 9   Date for PT Re-Evaluation 09/27/14   Authorization Type Medicare   Authorization Time Period 08-28-14 - 10-28-14   PT Start Time 0920   PT Stop Time 1015   PT Time Calculation (min) 55 min      Past Medical History  Diagnosis Date  . ED (erectile dysfunction)   . Dyslipidemia   . Hypertension   . Arthritis     Past Surgical History  Procedure Laterality Date  . Left total knee  2011  . Colonoscopy  2007    Dr.kaplan    There were no vitals taken for this visit.  Visit Diagnosis:  Abnormality of gait      Subjective Assessment - 09/17/14 1718    Symptoms Pt. states he worked in his yard yesterday - stumbled once but was able to catch himself and not fall   Pertinent History Neuropathy: s/p L TKR approx. 6 years ago   Diagnostic tests MRI   Patient Stated Goals Improve balance and steadiness with walking   Currently in Pain? No/denies                    [facility] Adult PT Treatment/Exercise - 09/17/14 0001    Transfers   Sit to Stand 5: Supervision   Ambulation/Gait   Ambulation/Gait Yes   Ambulation/Gait Assistance 5: Supervision   Ambulation Distance (Feet) 120 Feet   Assistive device None   Gait Pattern Lateral trunk lean to left;Trunk flexed;Lateral hip instability   Ambulation Surface Level   Ramp 5: Supervision   Curb 5: Supervision  slight CGA with stepping up onto curb   Exercises   Exercises Knee/Hip   Knee/Hip Exercises: Stretches   Active Hamstring Stretch 1 rep;30 seconds  bil. LE's   Gastroc Stretch 2 reps;30 seconds  LLE  2 reps: RLE 1 rep   Knee/Hip Exercises: Aerobic   Stationary Bike Scifit level 1.5 x 4" ; level 2.6 x 1"  5" total   Knee/Hip Exercises: Standing   Forward Step Up 10 reps  bil. LE's      NeuroRe-ed: single limb stance exercises on floor - cone taps with each LE with mod hand held assist; stepping over and back of cane on floor with 3# weight on each leg for strengthening and balance with CGA; Rockerboard with UE assist prn inside bars, then 10 reps without UE support with CGA Alternate stepping up/back on incline with min hand held assist           PT Short Term Goals - 08/28/14 2045    PT SHORT TERM GOAL #1   Title same as LTG's            PT Long Term Goals - 08/29/14 0957    PT LONG TERM GOAL #1   Title Increase Berg balance test score to >/= 52/56 to demo improved balance and decr. fall risk.   Baseline 48/56         target date 09-27-14   Time 4   Period Weeks   Status New  PT LONG TERM GOAL #2   Title Increase gait velocity to >/= 3.5 ft/sec with no device for incr. gait efficiency.   Baseline 3.04 ft/sec    target date 09-27-14   Time 4   Period Weeks   Status New   PT LONG TERM GOAL #3   Title Pt. will report feeling at least 50% improvement with balance and steadiness with ambulation, including turns.   Baseline target date 09-27-14   Time 4   Period Weeks   Status New   PT LONG TERM GOAL #4   Title Independent in HEP for balance and strengthening exercises.   Baseline target date 09-27-14   Time 4   Period Weeks   Status New               Plan - 09/17/14 1723    Clinical Impression Statement Pt. is improving with maintaining balance; he continues to have more difficulty lifting LLE than he does with lifting RLE and also has more difficulty balancing on LLE than on RLE   Pt will benefit from skilled therapeutic intervention in order to improve on the following deficits Abnormal gait;Decreased balance;Decreased strength   Rehab Potential Good    PT Frequency 2x / week   PT Duration 4 weeks   PT Treatment/Interventions ADLs/Self Care Home Management;Therapeutic activities;Patient/family education;Therapeutic exercise;Gait training;Balance training;Stair training;Neuromuscular re-education   PT Next Visit Plan cont. high level balance and gait training; check braiding ex. added to HEP   PT Home Exercise Plan Balance HEP as previously instructed   Consulted and Agree with Plan of Care Patient        Problem List Patient Active Problem List   Diagnosis Date Noted  . Hyperlipidemia LDL goal < 100 08/06/2013  . Essential hypertension, benign 09/17/2012  . Hematuria 09/17/2012    Alda Lea, PT 09/17/2014, 5:27 PM  [facility] [address] [place], [place], [address] Phone: [phone]   Fax:  [phone]

## 2014-09-22 ENCOUNTER — Ambulatory Visit: Payer: Medicare Other | Admitting: Physical Therapy

## 2014-09-22 DIAGNOSIS — R269 Unspecified abnormalities of gait and mobility: Secondary | ICD-10-CM

## 2014-09-22 DIAGNOSIS — G629 Polyneuropathy, unspecified: Secondary | ICD-10-CM | POA: Diagnosis not present

## 2014-09-22 DIAGNOSIS — Z96652 Presence of left artificial knee joint: Secondary | ICD-10-CM | POA: Diagnosis not present

## 2014-09-23 ENCOUNTER — Encounter: Payer: Self-pay | Admitting: Physical Therapy

## 2014-09-23 NOTE — Therapy (Signed)
[facility] [address] [place], [place], [address] Phone: [phone]   Fax:  [phone]  Physical Therapy Treatment  Patient Details  Name: Brian Montgomery MRN: [id number] Date of Birth: 05-28-1936 Referring Provider:  Denita Lung, MD  Encounter Date: 09/22/2014      PT End of Session - 09/23/14 1503    Visit Number 8  G8   Number of Visits 9   Date for PT Re-Evaluation 09/27/14   Authorization Type Medicare   Authorization Time Period 08-28-14 - 10-28-14   PT Start Time 0930   PT Stop Time 1016   PT Time Calculation (min) 46 min      Past Medical History  Diagnosis Date  . ED (erectile dysfunction)   . Dyslipidemia   . Hypertension   . Arthritis     Past Surgical History  Procedure Laterality Date  . Left total knee  2011  . Colonoscopy  2007    Dr.kaplan    There were no vitals filed for this visit.  Visit Diagnosis:  Abnormality of gait      Subjective Assessment - 09/23/14 1500    Symptoms Pt. states he feels that his balance is gradually getting better - has days that are better than others   Pertinent History Neuropathy: s/p L TKR approx. 6 years ago   Diagnostic tests MRI   Patient Stated Goals Improve balance and steadiness with walking   Currently in Pain? No/denies                       [facility] Adult PT Treatment/Exercise - 09/23/14 0001    Transfers   Sit to Stand 5: Supervision   Ambulation/Gait   Ambulation/Gait Yes   Ambulation/Gait Assistance 5: Supervision   Ambulation Distance (Feet) 120 Feet   Assistive device None   Gait Pattern Lateral trunk lean to left;Trunk flexed;Lateral hip instability   Ambulation Surface Level   Stairs Yes   Stairs Assistance 5: Supervision   Stair Management Technique Forwards;Step to pattern;Two rails   Number of Stairs 4   Height of Stairs 6   Ramp 5: Supervision   High Level Balance   High Level Balance Activities  Braiding;Marching forwards  with CGA   Exercises   Exercises Knee/Hip   Knee/Hip Exercises: Aerobic   Stationary Bike Scifit level 1.5 x 4" ; level 2.6 x 1"  5" total   Knee/Hip Exercises: Standing   Heel Raises 10 reps;1 set   Forward Step Up 10 reps  bil. LE's   Other Standing Knee Exercises bil. hip extension and abduction with green theraband x 10 reps each   Ankle Exercises: Standing   Heel Raises 10 reps                  PT Short Term Goals - 08/28/14 2045    PT SHORT TERM GOAL #1   Title same as LTG's            PT Long Term Goals - 08/29/14 0957    PT LONG TERM GOAL #1   Title Increase Berg balance test score to >/= 52/56 to demo improved balance and decr. fall risk.   Baseline 48/56         target date 09-27-14   Time 4   Period Weeks   Status New   PT LONG TERM GOAL #2   Title Increase gait velocity to >/= 3.5 ft/sec with no device  for incr. gait efficiency.   Baseline 3.04 ft/sec    target date 09-27-14   Time 4   Period Weeks   Status New   PT LONG TERM GOAL #3   Title Pt. will report feeling at least 50% improvement with balance and steadiness with ambulation, including turns.   Baseline target date 09-27-14   Time 4   Period Weeks   Status New   PT LONG TERM GOAL #4   Title Independent in HEP for balance and strengthening exercises.   Baseline target date 09-27-14   Time 4   Period Weeks   Status New               Plan - 09/23/14 1504    Clinical Impression Statement Pt. is progressing towards LTG's; balance is improving   Pt will benefit from skilled therapeutic intervention in order to improve on the following deficits Abnormal gait;Decreased balance;Decreased strength   Rehab Potential Good   PT Frequency 2x / week   PT Duration 4 weeks   PT Treatment/Interventions ADLs/Self Care Home Management;Therapeutic activities;Patient/family education;Therapeutic exercise;Gait training;Balance training;Stair training;Neuromuscular  re-education   PT Next Visit Plan check LTG's - renew   PT Home Exercise Plan Balance HEP as previously instructed   Consulted and Agree with Plan of Care Patient        Problem List Patient Active Problem List   Diagnosis Date Noted  . Hyperlipidemia LDL goal < 100 08/06/2013  . Essential hypertension, benign 09/17/2012  . Hematuria 09/17/2012    Alda Lea, PT 09/23/2014, 3:06 PM  [facility] [address] [place], [place], [address] Phone: [phone]   Fax:  [phone]

## 2014-09-25 ENCOUNTER — Ambulatory Visit: Payer: Medicare Other | Admitting: Physical Therapy

## 2014-09-25 DIAGNOSIS — R269 Unspecified abnormalities of gait and mobility: Secondary | ICD-10-CM

## 2014-09-25 DIAGNOSIS — G629 Polyneuropathy, unspecified: Secondary | ICD-10-CM | POA: Diagnosis not present

## 2014-09-25 DIAGNOSIS — Z96652 Presence of left artificial knee joint: Secondary | ICD-10-CM | POA: Diagnosis not present

## 2014-09-26 ENCOUNTER — Encounter: Payer: Self-pay | Admitting: Physical Therapy

## 2014-09-26 NOTE — Therapy (Signed)
[facility] [address] [place], [place], [address] Phone: [phone]   Fax:  [phone]  Physical Therapy Treatment  Patient Details  Name: Brian Montgomery MRN: [id number] Date of Birth: 1936/06/26 Referring Provider:  Denita Lung, MD  Encounter Date: 09/25/2014      PT End of Session - 09/26/14 0924    Visit Number 9  G9   Number of Visits 9   Date for PT Re-Evaluation 09/27/14   Authorization Type Medicare   Authorization Time Period 08-28-14 - 10-28-14   PT Start Time 0935   PT Stop Time 1020   PT Time Calculation (min) 45 min      Past Medical History  Diagnosis Date  . ED (erectile dysfunction)   . Dyslipidemia   . Hypertension   . Arthritis     Past Surgical History  Procedure Laterality Date  . Left total knee  2011  . Colonoscopy  2007    Dr.kaplan    There were no vitals filed for this visit.  Visit Diagnosis:  Abnormality of gait      Subjective Assessment - 09/26/14 0922    Symptoms Pt. reports no changes/denies falls - states he worked in yard yesterday   Pertinent History Neuropathy: s/p L TKR approx. 6 years ago   Diagnostic tests MRI   Patient Stated Goals Improve balance and steadiness with walking   Currently in Pain? No/denies                       [facility] Adult PT Treatment/Exercise - 09/26/14 0001    Transfers   Sit to Stand 5: Supervision   Ambulation/Gait   Ambulation/Gait Yes   Ambulation/Gait Assistance 5: Supervision   Ambulation Distance (Feet) 240 Feet   Assistive device None   Gait Pattern Lateral trunk lean to left;Trunk flexed;Lateral hip instability   Ambulation Surface Level   Stairs Yes   Stair Management Technique Forwards;Step to pattern;Two rails   Number of Stairs 4   Height of Stairs 6   Ramp 5: Supervision   High Level Balance   High Level Balance Activities Braiding;Marching forwards  with CGA   Exercises   Exercises Knee/Hip    Knee/Hip Exercises: Aerobic   Stationary Bike Scifit level 1.5 x 4" ; level 2.6 x 1"  5" total   Knee/Hip Exercises: Standing   Heel Raises 10 reps;1 set   Forward Step Up 10 reps  bil. LE's   Other Standing Knee Exercises bil. hip extension and abduction with green theraband x 10 reps each     NeuroRe-ed: Resisted amb. With sports cord inside bars 10' x 6 reps forward and back to challenge dynamic standing balance  standing on foam - marching and tapping down to floor with UE support prn with SBA for incr. Single limb stance          PT Short Term Goals - 08/28/14 2045    PT SHORT TERM GOAL #1   Title same as LTG's            PT Long Term Goals - 09/26/14 0926    PT LONG TERM GOAL #3   Title Pt. will report feeling at least 50% improvement with balance and steadiness with ambulation, including turns.   Baseline met 09-24-17   Status Achieved   PT LONG TERM GOAL #4   Title Independent in HEP for balance and strengthening exercises.   Baseline met 09-24-17  Status Achieved               Plan - 09/26/14 0927    Clinical Impression Statement Pt met LTG's #3 and 4; progressing well ; plan to renew due to significant progress achieved in gait and balance   Pt will benefit from skilled therapeutic intervention in order to improve on the following deficits Abnormal gait;Decreased balance;Decreased strength   Rehab Potential Good   PT Frequency 2x / week   PT Duration 4 weeks   PT Treatment/Interventions ADLs/Self Care Home Management;Therapeutic activities;Patient/family education;Therapeutic exercise;Gait training;Balance training;Stair training;Neuromuscular re-education   PT Next Visit Plan check LTG#1 and 2; renew   PT Home Exercise Plan Balance HEP as previously instructed   Consulted and Agree with Plan of Care Patient        Problem List Patient Active Problem List   Diagnosis Date Noted  . Hyperlipidemia LDL goal < 100 08/06/2013  . Essential  hypertension, benign 09/17/2012  . Hematuria 09/17/2012    Alda Lea, PT 09/26/2014, 9:30 AM  [facility] [address] [place], [place], [address] Phone: [phone]   Fax:  [phone]

## 2014-09-29 ENCOUNTER — Ambulatory Visit: Payer: Medicare Other | Admitting: Physical Therapy

## 2014-09-29 DIAGNOSIS — Z96652 Presence of left artificial knee joint: Secondary | ICD-10-CM | POA: Diagnosis not present

## 2014-09-29 DIAGNOSIS — R269 Unspecified abnormalities of gait and mobility: Secondary | ICD-10-CM | POA: Diagnosis not present

## 2014-09-29 DIAGNOSIS — G629 Polyneuropathy, unspecified: Secondary | ICD-10-CM | POA: Diagnosis not present

## 2014-09-30 ENCOUNTER — Encounter: Payer: Self-pay | Admitting: Physical Therapy

## 2014-09-30 NOTE — Therapy (Signed)
[facility] [address] [place], [place], [address] Phone: [phone]   Fax:  [phone]  Physical Therapy Treatment  Patient Details  Name: Brian Montgomery MRN: [id number] Date of Birth: 09-May-1936 Referring Provider:  Denita Lung, MD  Encounter Date: 2014/10/18      PT End of Session - 09/30/14 1215    Visit Number 10  G10   Number of Visits 10   Date for PT Re-Evaluation 10/30/14   Authorization Type Medicare   Authorization Time Period 10-30-14 - 12-08-14   PT Start Time 0800   PT Stop Time 0845   PT Time Calculation (min) 45 min      Past Medical History  Diagnosis Date  . ED (erectile dysfunction)   . Dyslipidemia   . Hypertension   . Arthritis     Past Surgical History  Procedure Laterality Date  . Left total knee  2011  . Colonoscopy  2007    Dr.kaplan    There were no vitals filed for this visit.  Visit Diagnosis:  Abnormality of gait - Plan: PT plan of care cert/re-cert      Subjective Assessment - 09/30/14 1211    Symptoms Pt. reports he is doing better with maintaining balance - says he is a little stiff this morning   Pertinent History Neuropathy: s/p L TKR approx. 6 years ago   Diagnostic tests MRI   Patient Stated Goals Improve balance and steadiness with walking   Currently in Pain? No/denies                       [facility] Adult PT Treatment/Exercise - 09/30/14 0001    Ambulation/Gait   Ambulation/Gait Yes   Ambulation/Gait Assistance 5: Supervision   Ambulation Distance (Feet) 250 Feet   Assistive device None   Gait Pattern Lateral trunk lean to left;Trunk flexed;Lateral hip instability   Ambulation Surface Level   Stair Management Technique Forwards;Two rails;Alternating pattern   Number of Stairs 4   Height of Stairs 6   Exercises   Exercises Knee/Hip   Knee/Hip Exercises: Standing   Forward Step Up 10 reps  bil. LE's   Rocker Board 1 minute      NeuroRe-ed; single limb stance activities - marching on incline with CGA; alternate stepping up and back x 10 reps each Stepping down and back up with CGA x 10 reps; alternate tap ups to 1st and 2nd step with UE support prn with CGA  Tapping cones with min hand held assist to CGA; stepping over and back of balance beam with CGA (standing on floor)  TherEx:  Heel raises x 10 reps; step ups and step down exercise x 10 reps with UE support           PT Short Term Goals - 08/28/14 2045    PT SHORT TERM GOAL #1   Title same as LTG's            PT Long Term Goals - 09/30/14 1224    PT LONG TERM GOAL #5   Title Negotiate 4 steps using a step by step sequence without use of hand rail with SBA   Baseline target date 10-30-14   Time 4   Period Weeks   Status New                 G-Codes - 10-18-2014 1226    Functional Assessment Tool Used Berg score 48/56;   TUG 12.6  secs;  gait velocity 3.05 ft/sec with no device   Functional Limitation Mobility: Walking and moving around   Mobility: Walking and Moving Around Current Status [phone]) At least 20 percent but less than 40 percent impaired, limited or restricted   Mobility: Walking and Moving Around Goal Status [phone]) At least 1 percent but less than 20 percent impaired, limited or restricted      Problem List Patient Active Problem List   Diagnosis Date Noted  . Hyperlipidemia LDL goal < 100 08/06/2013  . Essential hypertension, benign 09/17/2012  . Hematuria 09/17/2012    Alda Lea, PT 09/30/2014, 12:30 PM  [facility] [address] [place], [place], [address] Phone: [phone]   Fax:  [phone]

## 2014-10-01 ENCOUNTER — Ambulatory Visit: Payer: Medicare Other | Admitting: Physical Therapy

## 2014-10-01 ENCOUNTER — Encounter: Payer: Self-pay | Admitting: Physical Therapy

## 2014-10-01 DIAGNOSIS — R269 Unspecified abnormalities of gait and mobility: Secondary | ICD-10-CM | POA: Diagnosis not present

## 2014-10-01 DIAGNOSIS — G629 Polyneuropathy, unspecified: Secondary | ICD-10-CM | POA: Diagnosis not present

## 2014-10-01 DIAGNOSIS — Z96652 Presence of left artificial knee joint: Secondary | ICD-10-CM | POA: Diagnosis not present

## 2014-10-01 NOTE — Therapy (Signed)
[facility] [address] [place], [place], [address] Phone: [phone]   Fax:  [phone]  Physical Therapy Treatment  Patient Details  Name: Brian Montgomery MRN: [id number] Date of Birth: 02-15-1936 Referring Provider:  Denita Lung, MD  Encounter Date: 10/01/2014      PT End of Session - 10/01/14 0845    Visit Number 11   Number of Visits 10   Date for PT Re-Evaluation 10/30/14   Authorization Type Medicare   Authorization Time Period 10-30-14 - 12-08-14   PT Start Time 0803   PT Stop Time 0845   PT Time Calculation (min) 42 min   Equipment Utilized During Treatment Gait belt   Behavior During Therapy [facility] for tasks assessed/performed      Past Medical History  Diagnosis Date  . ED (erectile dysfunction)   . Dyslipidemia   . Hypertension   . Arthritis     Past Surgical History  Procedure Laterality Date  . Left total knee  2011  . Colonoscopy  2007    Dr.kaplan    There were no vitals filed for this visit.  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  Denies injury.   Pertinent History Neuropathy: s/p L TKR approx. 6 years ago   Diagnostic tests MRI   Patient Stated Goals Improve balance and steadiness with walking   Currently in Pain? No/denies                       [facility] Adult PT Treatment/Exercise - 10/01/14 0838    High Level Balance   High Level Balance Activities Other (comment)  taps to 1 step alternating LE then to 1st and 2nd step   High Level Balance Comments needed 1 UE support for higher step, catching L LE at times on edge;  Needed max cues to sequence.   Knee/Hip Exercises: Aerobic   Stationary Bike Scifit level 3.0 x all 4 extremities x 8 minutes   Knee/Hip Exercises: Standing   Other  Standing Knee Exercises  with #4 weight bil LE hip extension, SLR & marching x 15; hib abduction x 15 x 2 with 4#   Other Standing Knee Exercises pull ups on/off 6" step with bil UE support x 15             Balance Exercises - 10/01/14 2203    Balance Exercises: Standing   Rockerboard Anterior/posterior;Lateral;EO;Other time (comment);Intermittent UE support;Head turns  > 3 minutes each directions   Other Standing Exercises standing on foam balance beam with head turns and occasional UE support             PT Short Term Goals - 08/28/14 2045    PT SHORT TERM GOAL #1   Title same as LTG's            PT Long Term Goals - 09/30/14 1224    PT LONG TERM GOAL #5   Title Negotiate 4 steps using a step by step sequence without use of hand rail with SBA   Baseline target date 10-30-14   Time 4   Period Weeks   Status New               Plan - 10/01/14 4235  Clinical Impression Statement Pt continues to have falls-last one from LLE catching on curb.  Continue PT per POC.   Pt will benefit from skilled therapeutic intervention in order to improve on the following deficits Abnormal gait;Decreased balance;Decreased strength   Rehab Potential Good   PT Frequency 2x / week   PT Duration 4 weeks   PT Treatment/Interventions ADLs/Self Care Home Management;Therapeutic activities;Patient/family education;Therapeutic exercise;Gait training;Balance training;Stair training;Neuromuscular re-education   PT Next Visit Plan Continues balance and strengthening.   Consulted and Agree with Plan of Care Patient        Problem List Patient Active Problem List   Diagnosis Date Noted  . Hyperlipidemia LDL goal < 100 08/06/2013  . Essential hypertension, benign 09/17/2012  . Hematuria 09/17/2012    Narda Bonds 10/01/2014, 10:08 PM  [name] [address] [place], [place], [address] Phone: [phone]    Fax:  [facility] [place], [place] 10/01/2014 10:08 PM Phone: [phone] Fax: [phone]

## 2014-10-06 ENCOUNTER — Ambulatory Visit: Payer: Medicare Other | Admitting: Physical Therapy

## 2014-10-06 DIAGNOSIS — G629 Polyneuropathy, unspecified: Secondary | ICD-10-CM | POA: Diagnosis not present

## 2014-10-06 DIAGNOSIS — R269 Unspecified abnormalities of gait and mobility: Secondary | ICD-10-CM | POA: Diagnosis not present

## 2014-10-06 DIAGNOSIS — Z96652 Presence of left artificial knee joint: Secondary | ICD-10-CM | POA: Diagnosis not present

## 2014-10-06 NOTE — Therapy (Signed)
[facility] [address] [place], [place], [address] Phone: [phone]   Fax:  [phone]  Physical Therapy Treatment  Patient Details  Name: Brian Montgomery MRN: [id number] Date of Birth: [birth year] Referring Provider:  Denita Lung, MD  Encounter Date: 10/06/2014      PT End of Session - 10/06/14 0843    Visit Number 12   Date for PT Re-Evaluation 10/30/14   Authorization Type Medicare   Authorization Time Period 10-30-14 - 12-08-14   PT Start Time 0807  Pt arrived for PT session on incorrect day; this PT able to see   PT Stop Time 0846   PT Time Calculation (min) 39 min   Equipment Utilized During Treatment Gait belt   Activity Tolerance Patient tolerated treatment well   Behavior During Therapy [facility] for tasks assessed/performed      Past Medical History  Diagnosis Date  . ED (erectile dysfunction)   . Dyslipidemia   . Hypertension   . Arthritis     Past Surgical History  Procedure Laterality Date  . Left total knee  2011  . Colonoscopy  2007    Dr.kaplan    There were no vitals filed for this visit.  Visit Diagnosis:  Abnormality of gait      Subjective Assessment - 10/06/14 0809    Symptoms Denies falls, but still says he is off balance alot with gait.   Currently in Pain? No/denies                       [facility] Adult PT Treatment/Exercise - 10/06/14 0839    High Level Balance   High Level Balance Activities Side stepping;Backward walking;Marching forwards  on red/blue mat surfaces with min assist   High Level Balance Comments alternating step taps at 6" then 12" step 15 reps each with 1 UE support; heelwalking in parallel bars 3 x 10 ft.   Knee/Hip Exercises: Aerobic   Stationary Bike Scifit level 3.0 x all 4 extremities x 8 minutes   Knee/Hip Exercises: Standing   Heel Raises 15 reps;1 set  with toe raises   Forward Step Up Right;Left;1 set;15 reps;Hand Hold: 2;Step Height:  6"   Other Standing Knee Exercises 4# weights hip flexion, marching, abduction, extension, then hamstring curls x 15 reps each in parallel bars      Balance and strengthening exercises performed with cues for increased foot clearance, to improve overall gait pattern and decrease fall risk.            PT Short Term Goals - 08/28/14 2045    PT SHORT TERM GOAL #1   Title same as LTG's            PT Long Term Goals - 09/30/14 1224    PT LONG TERM GOAL #5   Title Negotiate 4 steps using a step by step sequence without use of hand rail with SBA   Baseline target date 10-30-14   Time 4   Period Weeks   Status New               Plan - 10/06/14 1005    Clinical Impression Statement Pt has difficulty with single limb stance and dynamic gait activities with progressively less UE support.  Pt will continue to benefit from further skilled PT to address balance, strength and gait.   Pt will benefit from skilled therapeutic intervention in order to improve on the following deficits Abnormal gait;Decreased balance;Decreased  strength   Rehab Potential Good   PT Frequency 2x / week   PT Duration 4 weeks   PT Treatment/Interventions ADLs/Self Care Home Management;Therapeutic activities;Patient/family education;Therapeutic exercise;Gait training;Balance training;Stair training;Neuromuscular re-education   PT Next Visit Plan Continues balance and strengthening.   Consulted and Agree with Plan of Care Patient    Plan:  Work on stair negotiation    Problem List Patient Active Problem List   Diagnosis Date Noted  . Hyperlipidemia LDL goal < 100 08/06/2013  . Essential hypertension, benign 09/17/2012  . Hematuria 09/17/2012    [facility] 10/06/2014, 10:07 AM  Mady Haagensen, PT 10/06/2014 10:09 AM Phone: [phone] Fax: [facility] [address] [place], [place], [address] Phone: [phone]    Fax:  [phone]

## 2014-10-07 ENCOUNTER — Ambulatory Visit: Payer: BLUE CROSS/BLUE SHIELD | Admitting: Physical Therapy

## 2014-10-08 ENCOUNTER — Encounter: Payer: Self-pay | Admitting: Physical Therapy

## 2014-10-08 ENCOUNTER — Ambulatory Visit: Payer: Medicare Other | Admitting: Physical Therapy

## 2014-10-08 DIAGNOSIS — R269 Unspecified abnormalities of gait and mobility: Secondary | ICD-10-CM

## 2014-10-08 DIAGNOSIS — Z96652 Presence of left artificial knee joint: Secondary | ICD-10-CM | POA: Diagnosis not present

## 2014-10-08 DIAGNOSIS — G629 Polyneuropathy, unspecified: Secondary | ICD-10-CM | POA: Diagnosis not present

## 2014-10-08 NOTE — Therapy (Signed)
[facility] [address] [place], [place], [address] Phone: [phone]   Fax:  [phone]  Physical Therapy Treatment  Patient Details  Name: Brian Montgomery MRN: [id number] Date of Birth: [birth year] Referring Provider:  Denita Lung, MD  Encounter Date: 10/08/2014      PT End of Session - 10/08/14 0940    Visit Number 13   Date for PT Re-Evaluation 10/30/14   Authorization Type Medicare   Authorization Time Period 10-30-14 - 12-08-14   PT Start Time 0850   PT Stop Time 0932   PT Time Calculation (min) 42 min   Activity Tolerance Patient tolerated treatment well   Behavior During Therapy [facility] for tasks assessed/performed      Past Medical History  Diagnosis Date  . ED (erectile dysfunction)   . Dyslipidemia   . Hypertension   . Arthritis     Past Surgical History  Procedure Laterality Date  . Left total knee  2011  . Colonoscopy  2007    Dr.kaplan    There were no vitals filed for this visit.  Visit Diagnosis:  Abnormality of gait      Subjective Assessment - 10/08/14 0855    Symptoms Denies falls or changes.  Worked in the yard Tuesday.   Diagnostic tests MRI   Patient Stated Goals Improve balance and steadiness with walking   Currently in Pain? No/denies                       [facility] Adult PT Treatment/Exercise - 10/08/14 0923    Knee/Hip Exercises: Aerobic   Stationary Bike Nustep workload 5 x all 4 extremities x 10 minutes with >80 steps per minutes   Knee/Hip Exercises: Standing   Other Standing Knee Exercises bil hip abduction, extension, marching, hamstring curl x 15 at counter with 3# weight   Other Standing Knee Exercises side stepping at counter with 3# weight bil LE x 8' x 8 reps with cues to keep toes pointed forward   Knee/Hip Exercises: Supine   Bridges Both;15 reps   Straight Leg Raises Both;15 reps;Other (comment)  4# weight   Knee/Hip Exercises: Sidelying   Hip  ABduction Both;15 reps;Other (comment)  3# weight   Clams bil sides x 15 with red theraband                  PT Short Term Goals - 08/28/14 2045    PT SHORT TERM GOAL #1   Title same as LTG's            PT Long Term Goals - 09/30/14 1224    PT LONG TERM GOAL #5   Title Negotiate 4 steps using a step by step sequence without use of hand rail with SBA   Baseline target date 10-30-14   Time 4   Period Weeks   Status New               Plan - 10/08/14 0940    Clinical Impression Statement Pt continues with bil LE weakness and balance deficits.  Continue PT per POC.     Pt will benefit from skilled therapeutic intervention in order to improve on the following deficits Abnormal gait;Decreased balance;Decreased strength   Rehab Potential Good   PT Frequency 2x / week   PT Duration 4 weeks   PT Treatment/Interventions ADLs/Self Care Home Management;Therapeutic activities;Patient/family education;Therapeutic exercise;Gait training;Balance training;Stair training;Neuromuscular re-education   PT Next Visit Plan Continue balance and  strengthening.   Consulted and Agree with Plan of Care Patient        Problem List Patient Active Problem List   Diagnosis Date Noted  . Hyperlipidemia LDL goal < 100 08/06/2013  . Essential hypertension, benign 09/17/2012  . Hematuria 09/17/2012    Narda Bonds 10/08/2014, 9:47 AM  [facility] [address] [place], [place], [address] Phone: [phone]   Fax:  [place], [facility] [place] 10/08/2014 9:47 AM Phone: [phone] Fax: [phone]

## 2014-10-13 ENCOUNTER — Encounter: Payer: Self-pay | Admitting: Neurology

## 2014-10-13 ENCOUNTER — Ambulatory Visit (INDEPENDENT_AMBULATORY_CARE_PROVIDER_SITE_OTHER): Payer: Medicare Other | Admitting: Neurology

## 2014-10-13 VITALS — BP 140/80 | HR 70 | Temp 98.3°F | Resp 18 | Ht 65.0 in | Wt 190.4 lb

## 2014-10-13 DIAGNOSIS — R2681 Unsteadiness on feet: Secondary | ICD-10-CM | POA: Diagnosis not present

## 2014-10-13 DIAGNOSIS — R482 Apraxia: Secondary | ICD-10-CM | POA: Diagnosis not present

## 2014-10-13 DIAGNOSIS — H5581 Saccadic eye movements: Secondary | ICD-10-CM

## 2014-10-13 NOTE — Progress Notes (Signed)
NEUROLOGY FOLLOW UP OFFICE NOTE  Brian Montgomery [id number]  HISTORY OF PRESENT ILLNESS: Brian Montgomery is a 79 year old right-handed man with hypertension, dyslipidemia, and arthritis who follows up for gait instability.  Labs and rehabilitation reviewed.  He is accompanied by his daughter who provides some history.  UPDATE: Labs, including ANA, Sed Rate, B12, SPEP/IFE were normal.  He has been going to physical therapy, which has only mildly been effective thus far.  He reports that his ears feel itchy and "stuffed up".    HISTORY: For about 8 months, he has had increased problems with balance.  When he gets up, he has to take a moment to maintain his balance.  He sometimes feels has to push off.  When he is walking, he feels like his body is being pulled to either side.  There is no spinning sensation, lightheadedness, nausea, vomiting, slurred speech, visual disturbance or numbness or weakness.  He denies neck pain, low back pain or pain in the legs when he walks or stands.  He has longstanding problems with hearing but no tinnitus.  Orthostatics were checked, which were negative.  He has had a couple of falls.    Labs from 06/23/14 include unremarkable CBC, CMP (including glucose of less than 100), and TSH 2.231.  He does have history of low back surgery and left knee replacement.  PAST MEDICAL HISTORY: Past Medical History  Diagnosis Date  . ED (erectile dysfunction)   . Dyslipidemia   . Hypertension   . Arthritis     MEDICATIONS: Current Outpatient Prescriptions on File Prior to Visit  Medication Sig Dispense Refill  . aspirin 81 MG tablet Take 81 mg by mouth daily.    . fish oil-omega-3 fatty acids 1000 MG capsule Take 1 g by mouth daily.     Marland Kitchen HYDROcodone-acetaminophen (NORCO/VICODIN) 5-325 MG per tablet   0  . lisinopril-hydrochlorothiazide (PRINZIDE,ZESTORETIC) 10-12.5 MG per tablet TAKE 1 TABLET BY MOUTH DAILY 90 tablet 0  . meloxicam (MOBIC) 15 MG tablet   2  . Multiple  Vitamins-Minerals (MULTIVITAMIN WITH MINERALS) tablet Take 1 tablet by mouth daily.      . simvastatin (ZOCOR) 20 MG tablet TAKE 1 TABLET BY MOUTH DAILY 90 tablet 0  . tadalafil (CIALIS) 20 MG tablet Take 1 tablet (20 mg total) by mouth every other day as needed for erectile dysfunction. 3 tablet 0-   No current facility-administered medications on file prior to visit.    ALLERGIES: No Known Allergies  FAMILY HISTORY: Family History  Problem Relation Age of Onset  . Heart failure Mother   . Heart failure Father   . Hypertension Mother   . Hypertension Father   . Renal Disease Sister   . Diabetes Brother     SOCIAL HISTORY: History   Social History  . Marital Status: Married    Spouse Name: N/A  . Number of Children: N/A  . Years of Education: N/A   Occupational History  . Not on file.   Social History Main Topics  . Smoking status: Former Research scientist (life sciences)  . Smokeless tobacco: Never Used  . Alcohol Use: 0.0 oz/week    0 Standard drinks or equivalent per week     Comment: rare  . Drug Use: No  . Sexual Activity:    Partners: Female   Other Topics Concern  . Not on file   Social History Narrative    REVIEW OF SYSTEMS: Constitutional: No fevers, chills, or sweats, no generalized fatigue, change  in appetite Eyes: No visual changes, double vision, eye pain Ear, nose and throat: No hearing loss, ear pain, nasal congestion, sore throat Cardiovascular: No chest pain, palpitations Respiratory:  No shortness of breath at rest or with exertion, wheezes GastrointestinaI: No nausea, vomiting, diarrhea, abdominal pain, fecal incontinence Genitourinary:  No dysuria, urinary retention or frequency Musculoskeletal:  No neck pain, back pain Integumentary: No rash, pruritus, skin lesions Neurological: as above Psychiatric: No depression, insomnia, anxiety Endocrine: No palpitations, fatigue, diaphoresis, mood swings, change in appetite, change in weight, increased  thirst Hematologic/Lymphatic:  No anemia, purpura, petechiae. Allergic/Immunologic: no itchy/runny eyes, nasal congestion, recent allergic reactions, rashes  PHYSICAL EXAM: Filed Vitals:   10/13/14 0902  BP: 140/80  Pulse: 70  Temp: 98.3 F (36.8 C)  Resp: 18   General: No acute distress Head:  Normocephalic/atraumatic Eyes:  Fundoscopic exam unremarkable without vessel changes, exudates, hemorrhages or papilledema. Neck: supple, no paraspinal tenderness, full range of motion Heart:  Regular rate and rhythm Lungs:  Clear to auscultation bilaterally Back: No paraspinal tenderness Neurological Exam: alert and oriented to person, place, and time. Attention span and concentration intact, recent and remote memory intact, fund of knowledge intact.  Speech fluent and not dysarthric, language intact.  Exhibits some apraxia when asked to track my finger.  Saccadic eye movements noted with horizontal tracking.  No upward or downward gaze.  CN II-XII intact. Fundoscopic exam unremarkable without vessel changes, exudates, hemorrhages or papilledema.  Bulk and tone normal, muscle strength 5/5 throughout.  Sensation to light touch, temperature and vibration intact.  Deep tendon reflexes 2+ throughout, toes downgoing.  Finger to nose and heel to shin testing intact.  Gait wide-based with sidestepping, able to turn, unable to walk tandem walk, Romberg negative.  IMPRESSION: Gait instability.  Possibly underlying idiopathic peripheral neuropathy.  He doesn't endorse dizziness or vertigo.  He does not have long-track signs to suggest stroke or myelopathy.  He does not have symptoms to suggest lumbar stenosis.  However, he does have deficits in extra-ocular eye movements and mild apraxia.  PLAN: 1.  Will get MRI of brain without contrast to evaluate any possible intracranial etiology 2.  Continue PT 3.  Follow up in 3 months.  Metta Clines, DO  CC:  Jill Alexanders, MD

## 2014-10-13 NOTE — Patient Instructions (Addendum)
1.  We will check MRI of the brain without contrast to look for any other causes for the balance and walking problems [facility]  10/27/14  At 2:45pm 2.  Continue physical therapy 3.  Follow up in 3 months.  Will contact you with MRI results and if we need to get other tests.

## 2014-10-14 ENCOUNTER — Ambulatory Visit: Payer: Medicare Other | Attending: Neurology | Admitting: Physical Therapy

## 2014-10-14 DIAGNOSIS — R269 Unspecified abnormalities of gait and mobility: Secondary | ICD-10-CM

## 2014-10-14 DIAGNOSIS — G629 Polyneuropathy, unspecified: Secondary | ICD-10-CM | POA: Insufficient documentation

## 2014-10-14 DIAGNOSIS — Z96652 Presence of left artificial knee joint: Secondary | ICD-10-CM | POA: Insufficient documentation

## 2014-10-15 ENCOUNTER — Encounter: Payer: Self-pay | Admitting: Physical Therapy

## 2014-10-15 NOTE — Therapy (Signed)
[facility] [address] [place], [place], [address] Phone: [phone]   Fax:  [phone]  Physical Therapy Treatment  Patient Details  Name: Brian Montgomery MRN: [id number] Date of Birth: [birth year] Referring Provider:  Denita Lung, MD  Encounter Date: 10/14/2014      PT End of Session - 10/15/14 1034    Visit Number 14   Number of Visits 18   Date for PT Re-Evaluation 10/30/14   Authorization Type Medicare   Authorization Time Period 09-29-14 - 12-08-14   PT Start Time 0932   PT Stop Time 1015   PT Time Calculation (min) 43 min      Past Medical History  Diagnosis Date  . ED (erectile dysfunction)   . Dyslipidemia   . Hypertension   . Arthritis     Past Surgical History  Procedure Laterality Date  . Left total knee  2011  . Colonoscopy  2007    Dr.kaplan    There were no vitals filed for this visit.  Visit Diagnosis:  Abnormality of gait      Subjective Assessment - 10/15/14 1032    Subjective Pt. reports Dr. [name] has ordered a MRI to be done   Pertinent History Neuropathy: s/p L TKR approx. 6 years ago   Diagnostic tests MRI ordered   Patient Stated Goals Improve balance and steadiness with walking   Currently in Pain? No/denies                       [facility] Adult PT Treatment/Exercise - 10/15/14 0001    Ambulation/Gait   Ambulation/Gait Yes   Ambulation/Gait Assistance 5: Supervision   Ambulation Distance (Feet) 275 Feet   Assistive device None   Gait Pattern Lateral trunk lean to left;Trunk flexed;Lateral hip instability   Ambulation Surface Level   Stairs Yes   Stairs Assistance 5: Supervision   Stair Management Technique Forwards;Two rails;Alternating pattern   Number of Stairs 4   Height of Stairs 6   High Level Balance   High Level Balance Activities Side stepping;Backward walking;Marching forwards  on red/blue mat surfaces with min assist   High Level Balance  Comments alternating step taps at 6" then 12" step 15 reps each with 1 UE support; heelwalking in parallel bars 3 x 10 ft.   Exercises   Exercises Knee/Hip   Knee/Hip Exercises: Standing   Heel Raises 15 reps;1 set  with toe raises   Forward Step Up Right;Left;1 set;15 reps;Hand Hold: 2;Step Height: 6"   Rocker Board 1 minute   Other Standing Knee Exercises bil hip abduction, extension, marching, hamstring curl x 15 at counter with 3# weight                  PT Short Term Goals - 08/28/14 2045    PT SHORT TERM GOAL #1   Title same as LTG's            PT Long Term Goals - 09/30/14 1224    PT LONG TERM GOAL #5   Title Negotiate 4 steps using a step by step sequence without use of hand rail with SBA   Baseline target date 10-30-14   Time 4   Period Weeks   Status New               Plan - 10/15/14 1036    Clinical Impression Statement Pt. cont to demo mild gait deviations but no significant LOB occurs with  gait; balance is WFL's   Pt will benefit from skilled therapeutic intervention in order to improve on the following deficits Abnormal gait;Decreased balance;Decreased strength   Rehab Potential Good   PT Frequency 2x / week   PT Duration 4 weeks   PT Treatment/Interventions ADLs/Self Care Home Management;Therapeutic activities;Patient/family education;Therapeutic exercise;Gait training;Balance training;Stair training;Neuromuscular re-education   PT Next Visit Plan Continue balance and strengthening.   PT Home Exercise Plan Balance HEP as previously instructed        Problem List Patient Active Problem List   Diagnosis Date Noted  . Gait instability 10/13/2014  . Hyperlipidemia LDL goal < 100 08/06/2013  . Essential hypertension, benign 09/17/2012  . Hematuria 09/17/2012    Alda Lea, PT 10/15/2014, 10:38 AM  [facility] [address] [place], [place], [address] Phone:  [phone]   Fax:  [phone]

## 2014-10-16 ENCOUNTER — Encounter: Payer: Self-pay | Admitting: Physical Therapy

## 2014-10-16 ENCOUNTER — Ambulatory Visit: Payer: Medicare Other | Admitting: Physical Therapy

## 2014-10-16 DIAGNOSIS — R269 Unspecified abnormalities of gait and mobility: Secondary | ICD-10-CM | POA: Diagnosis not present

## 2014-10-16 DIAGNOSIS — G629 Polyneuropathy, unspecified: Secondary | ICD-10-CM | POA: Diagnosis not present

## 2014-10-16 DIAGNOSIS — Z96652 Presence of left artificial knee joint: Secondary | ICD-10-CM | POA: Diagnosis not present

## 2014-10-16 NOTE — Therapy (Signed)
[facility] [address] [place], [place], [address] Phone: [phone]   Fax:  [phone]  Physical Therapy Treatment  Patient Details  Name: Brian Montgomery MRN: [id number] Date of Birth: [birth year] Referring Provider:  Denita Lung, MD  Encounter Date: 10/16/2014      PT End of Session - 10/16/14 1638    Visit Number 16   Number of Visits 18   Date for PT Re-Evaluation 10/30/14   Authorization Type Medicare   Authorization Time Period 09-29-14 - 12-08-14   PT Start Time 0933   PT Stop Time 1016   PT Time Calculation (min) 43 min   Equipment Utilized During Treatment Gait belt   Activity Tolerance Patient tolerated treatment well   Behavior During Therapy [facility] for tasks assessed/performed      Past Medical History  Diagnosis Date  . ED (erectile dysfunction)   . Dyslipidemia   . Hypertension   . Arthritis     Past Surgical History  Procedure Laterality Date  . Left total knee  2011  . Colonoscopy  2007    Dr.kaplan    There were no vitals filed for this visit.  Visit Diagnosis:  Abnormality of gait      Subjective Assessment - 10/16/14 0937    Subjective Denies falls or changes.  "Looks like my time here is limited.  I'm finising next week."   Pertinent History Neuropathy: s/p L TKR approx. 6 years ago   Diagnostic tests MRI ordered   Patient Stated Goals Improve balance and steadiness with walking   Currently in Pain? No/denies                       [facility] Adult PT Treatment/Exercise - 10/16/14 0947    Ambulation/Gait   Stairs Yes   Stairs Assistance 5: Supervision   Stair Management Technique Forwards;Two rails;Alternating pattern   Number of Stairs 4   Height of Stairs 6   Knee/Hip Exercises: Aerobic   Stationary Bike Scifit level 3.0 all 4 extremities x 5 minutes   Knee/Hip Exercises: Standing   Knee Flexion --  4# weight   Lateral Step Up Both;15 reps;Hand Hold: 2;Step  Height: 6"   Forward Step Up 15 reps;Hand Hold: 2;Step Height: 6";Right;Left   Other Standing Knee Exercises bil hip abduction, extension, straight leg hip flexion, marching, hamstring curl x 15 in parallel bars with 4# weight;sidestepping in parallel bars with green theraband around ankles for hip abduction   Other Standing Knee Exercises taps to step 1, 2, 1 then floor with UE support, alternating LE's and verbal cues to stay on sequence x 15 each side                  PT Short Term Goals - 08/28/14 2045    PT SHORT TERM GOAL #1   Title same as LTG's            PT Long Term Goals - 09/30/14 1224    PT LONG TERM GOAL #5   Title Negotiate 4 steps using a step by step sequence without use of hand rail with SBA   Baseline target date 10-30-14   Time 4   Period Weeks   Status New               Plan - 10/16/14 1639    Clinical Impression Statement Pt continues with decreased L LE strength at times.  Continue PT per POC.  Pt will benefit from skilled therapeutic intervention in order to improve on the following deficits Abnormal gait;Decreased balance;Decreased strength   Rehab Potential Good   PT Frequency 2x / week   PT Duration 4 weeks   PT Treatment/Interventions ADLs/Self Care Home Management;Therapeutic activities;Patient/family education;Therapeutic exercise;Gait training;Balance training;Stair training;Neuromuscular re-education   PT Next Visit Plan Begin checking goals.   Consulted and Agree with Plan of Care Patient        Problem List Patient Active Problem List   Diagnosis Date Noted  . Gait instability 10/13/2014  . Hyperlipidemia LDL goal < 100 08/06/2013  . Essential hypertension, benign 09/17/2012  . Hematuria 09/17/2012    Narda Bonds 10/16/2014, 4:41 PM  [facility] [address] [place], [place], [address] Phone: [phone]   Fax:  [place], [place] 10/16/2014 4:41 PM Phone: [phone] Fax: [phone]

## 2014-10-21 ENCOUNTER — Ambulatory Visit: Payer: Medicare Other | Admitting: Physical Therapy

## 2014-10-21 DIAGNOSIS — Z96652 Presence of left artificial knee joint: Secondary | ICD-10-CM | POA: Diagnosis not present

## 2014-10-21 DIAGNOSIS — R269 Unspecified abnormalities of gait and mobility: Secondary | ICD-10-CM

## 2014-10-21 DIAGNOSIS — G629 Polyneuropathy, unspecified: Secondary | ICD-10-CM | POA: Diagnosis not present

## 2014-10-22 ENCOUNTER — Encounter: Payer: Self-pay | Admitting: Physical Therapy

## 2014-10-22 ENCOUNTER — Telehealth: Payer: Self-pay | Admitting: Neurology

## 2014-10-22 NOTE — Telephone Encounter (Signed)
Form was filled out for this patient

## 2014-10-22 NOTE — Therapy (Signed)
[facility] [address] [place], [place], [address] Phone: [phone]   Fax:  [phone]  Physical Therapy Treatment  Patient Details  Name: Brian Montgomery MRN: [id number] Date of Birth: 02-Sep-1935 Referring Provider:  Denita Lung, MD  Encounter Date: 10/21/2014      PT End of Session - 10/22/14 0924    Visit Number 17   Number of Visits 18   Date for PT Re-Evaluation 10/30/14   Authorization Type Medicare   Authorization Time Period 09-29-14 - 12-08-14   PT Start Time 0934   PT Stop Time 1017   PT Time Calculation (min) 43 min      Past Medical History  Diagnosis Date  . ED (erectile dysfunction)   . Dyslipidemia   . Hypertension   . Arthritis     Past Surgical History  Procedure Laterality Date  . Left total knee  2011  . Colonoscopy  2007    Dr.kaplan    There were no vitals filed for this visit.  Visit Diagnosis:  Abnormality of gait      Subjective Assessment - 10/22/14 0922    Subjective Pt. reports working in yard on Monday - denies major LOB; still waiting to hear of MRI appt. time   Pertinent History Neuropathy: s/p L TKR approx. 6 years ago   Diagnostic tests MRI ordered   Patient Stated Goals Improve balance and steadiness with walking   Currently in Pain? No/denies                       [facility] Adult PT Treatment/Exercise - 10/22/14 0001    Ambulation/Gait   Ambulation/Gait Yes   Ambulation/Gait Assistance 5: Supervision   Ambulation Distance (Feet) 250 Feet   Assistive device None   Ambulation Surface Level;Indoor   Stairs Yes   Stairs Assistance 6: Modified independent (Device/Increase time)  with rails   Stair Management Technique Forwards;Two rails;Alternating pattern   Number of Stairs 4   Height of Stairs 6   High Level Balance   High Level Balance Activities Side stepping;Backward walking;Marching forwards  on red/blue mat surfaces with min assist   High Level Balance Comments alternating step taps at 6" then 12" step 15 reps each with 1 UE support; heelwalking in parallel bars 3 x 10 ft.   Exercises   Exercises Knee/Hip   Knee/Hip Exercises: Standing   Heel Raises 10 reps                  PT Short Term Goals - 08/28/14 2045    PT SHORT TERM GOAL #1   Title same as LTG's            PT Long Term Goals - 09/30/14 1224    PT LONG TERM GOAL #5   Title Negotiate 4 steps using a step by step sequence without use of hand rail with SBA   Baseline target date 10-30-14   Time 4   Period Weeks   Status New               Plan - 10/22/14 4656    Clinical Impression Statement Pt. progressing towards LTG's   Pt will benefit from skilled therapeutic intervention in order to improve on the following deficits Abnormal gait;Decreased balance;Decreased strength   Rehab Potential Good   PT Frequency 2x / week   PT Duration 4 weeks   PT Treatment/Interventions ADLs/Self Care Home Management;Therapeutic activities;Patient/family education;Therapeutic exercise;Gait training;Balance  training;Stair training;Neuromuscular re-education   PT Next Visit Plan D/C   PT Home Exercise Plan Balance HEP as previously instructed   Consulted and Agree with Plan of Care Patient        Problem List Patient Active Problem List   Diagnosis Date Noted  . Gait instability 10/13/2014  . Hyperlipidemia LDL goal < 100 08/06/2013  . Essential hypertension, benign 09/17/2012  . Hematuria 09/17/2012    Alda Lea, PT 10/22/2014, 9:27 AM  [facility] [address] [place], [place], [address] Phone: [phone]   Fax:  [phone]

## 2014-10-22 NOTE — Telephone Encounter (Signed)
[name], pt's daughter called wanting to f/u on a handicap form that she faxed over early last week. Py has not heard anything. Please call her.  #  [phone]

## 2014-10-23 ENCOUNTER — Ambulatory Visit: Payer: Medicare Other | Admitting: Physical Therapy

## 2014-10-23 ENCOUNTER — Encounter: Payer: Self-pay | Admitting: Physical Therapy

## 2014-10-23 DIAGNOSIS — Z96652 Presence of left artificial knee joint: Secondary | ICD-10-CM | POA: Diagnosis not present

## 2014-10-23 DIAGNOSIS — R269 Unspecified abnormalities of gait and mobility: Secondary | ICD-10-CM

## 2014-10-23 DIAGNOSIS — G629 Polyneuropathy, unspecified: Secondary | ICD-10-CM | POA: Diagnosis not present

## 2014-10-23 NOTE — Therapy (Signed)
[facility] [address] [place], [place], [address] Phone: [phone]   Fax:  [phone]  Physical Therapy Treatment  Patient Details  Name: Brian Montgomery MRN: [id number] Date of Birth: Feb 06, 1936 Referring Provider:  Denita Lung, MD  Encounter Date: 10/23/2014      PT End of Session - 10/23/14 1700    Visit Number 18   Number of Visits 18   Date for PT Re-Evaluation 10/30/14   Authorization Type Medicare   Authorization Time Period 09-29-14 - 12-08-14   PT Start Time 0931   PT Stop Time 1016   PT Time Calculation (min) 45 min      Past Medical History  Diagnosis Date  . ED (erectile dysfunction)   . Dyslipidemia   . Hypertension   . Arthritis     Past Surgical History  Procedure Laterality Date  . Left total knee  2011  . Colonoscopy  2007    Dr.kaplan    There were no vitals filed for this visit.  Visit Diagnosis:  Abnormality of gait      Subjective Assessment - 10/23/14 1642    Subjective "Today I'm getting out of here" (scheduled for PT discharge today)   Pertinent History Neuropathy: s/p L TKR approx. 6 years ago   Diagnostic tests MRI ordered   Patient Stated Goals Improve balance and steadiness with walking   Currently in Pain? No/denies                       [facility] Adult PT Treatment/Exercise - 10/23/14 0939    Ambulation/Gait   Ambulation/Gait Yes   Ambulation Distance (Feet) 120 Feet   Assistive device None   Gait Pattern Lateral trunk lean to left;Trunk flexed;Lateral hip instability   Ambulation Surface Level;Indoor   Gait velocity 4.15   Stairs Yes   Stairs Assistance 6: Modified independent (Device/Increase time)  with rails   Stair Management Technique Forwards;Two rails;Alternating pattern   Number of Stairs 4   Height of Stairs 6   Berg Balance Test   Sit to Stand Able to stand without using hands and stabilize independently   Standing Unsupported  Able to stand safely 2 minutes   Sitting with Back Unsupported but Feet Supported on Floor or Stool Able to sit safely and securely 2 minutes   Stand to Sit Sits safely with minimal use of hands   Transfers Able to transfer safely, minor use of hands   Standing Unsupported with Eyes Closed Able to stand 10 seconds safely   Standing Ubsupported with Feet Together Able to place feet together independently and stand 1 minute safely   From Standing, Reach Forward with Outstretched Arm Can reach confidently >25 cm (10")   From Standing Position, Pick up Object from Floor Able to pick up shoe safely and easily   From Standing Position, Turn to Look Behind Over each Shoulder Looks behind from both sides and weight shifts well   Turn 360 Degrees Able to turn 360 degrees safely in 4 seconds or less   Standing Unsupported, Alternately Place Feet on Step/Stool Able to stand independently and safely and complete 8 steps in 20 seconds   Standing Unsupported, One Foot in Front Able to plae foot ahead of the other independently and hold 30 seconds   Standing on One Leg Tries to lift leg/unable to hold 3 seconds but remains standing independently   Total Score 52   High Level Balance  High Level Balance Activities Side stepping;Backward walking;Marching forwards  on red/blue mat surfaces with min assist   High Level Balance Comments alternating step taps at 6" then 12" step 15 reps each with 1 UE support; heelwalking in parallel bars 3 x 10 ft.   Exercises   Exercises Knee/Hip   Knee/Hip Exercises: Standing   Heel Raises 10 reps     Leg Press 60# 3 sets 10 reps bil. LE's             PT Short Term Goals - 08/28/14 2045    PT SHORT TERM GOAL #1   Title same as LTG's            PT Long Term Goals - 11-03-2014 1704    PT LONG TERM GOAL #1   Title Increase Berg balance test score to >/= 52/56 to demo improved balance and decr. fall risk.   Baseline score 52/56   Status Achieved   PT LONG  TERM GOAL #2   Title Increase gait velocity to >/= 3.5 ft/sec with no device for incr. gait efficiency.   Baseline 4.15 ft/sec with no device   Status Achieved   PT LONG TERM GOAL #3   Title Pt. will report feeling at least 50% improvement with balance and steadiness with ambulation, including turns.   Baseline met 11/03/14   Status Achieved   PT LONG TERM GOAL #4   Title Independent in HEP for balance and strengthening exercises.   Status Achieved   PT LONG TERM GOAL #5   Title Negotiate 4 steps using a step by step sequence without use of hand rail with SBA   Baseline met Nov 03, 2014   Status Achieved               Plan - 03-Nov-2014 1702    Clinical Impression Statement pt. has met all LTG's - discharge due to all goals met - no further needs identified   Pt will benefit from skilled therapeutic intervention in order to improve on the following deficits Abnormal gait;Decreased balance;Decreased strength   Rehab Potential Good   PT Frequency 2x / week   PT Duration 4 weeks   PT Treatment/Interventions ADLs/Self Care Home Management;Therapeutic activities;Patient/family education;Therapeutic exercise;Gait training;Balance training;Stair training;Neuromuscular re-education   PT Home Exercise Plan Balance HEP as previously instructed   Consulted and Agree with Plan of Care Patient          G-Codes - 11/03/14 1705    Functional Assessment Tool Used Berg score 52/56;  gait velocity 4.15 ft/sec with no device   Functional Limitation Mobility: Walking and moving around   Mobility: Walking and Moving Around Goal Status [phone]) At least 1 percent but less than 20 percent impaired, limited or restricted   Mobility: Walking and Moving Around Discharge Status [phone]) At least 1 percent but less than 20 percent impaired, limited or restricted    PHYSICAL THERAPY DISCHARGE SUMMARY  Visits from Start of Care:  18  Current functional level related to goals / functional outcomes: See above  for progress towards LTG's - all goals met   Remaining deficits: Continued decr. high level balance skills, i.e. Single limb stance and tandem stance   Education / Equipment: Pt. Has been instructed in balance exercises for HEP Plan: Patient agrees to discharge.  Patient goals were met. Patient is being discharged due to meeting the stated rehab goals.  ?????       Problem List Patient Active Problem List   Diagnosis Date  Noted  . Gait instability 10/13/2014  . Hyperlipidemia LDL goal < 100 08/06/2013  . Essential hypertension, benign 09/17/2012  . Hematuria 09/17/2012    Alda Lea, PT 10/23/2014, 5:08 PM  [facility] [address] [place], [place], [address] Phone: [phone]   Fax:  [phone]

## 2014-10-27 ENCOUNTER — Ambulatory Visit (HOSPITAL_COMMUNITY)
Admission: RE | Admit: 2014-10-27 | Discharge: 2014-10-27 | Disposition: A | Discharge status: Home / Self Care | Payer: Medicare Other | Source: Ambulatory Visit | Attending: Neurology | Admitting: Neurology

## 2014-10-27 DIAGNOSIS — E785 Hyperlipidemia, unspecified: Secondary | ICD-10-CM | POA: Insufficient documentation

## 2014-10-27 DIAGNOSIS — I10 Essential (primary) hypertension: Secondary | ICD-10-CM | POA: Diagnosis not present

## 2014-10-27 DIAGNOSIS — I739 Peripheral vascular disease, unspecified: Secondary | ICD-10-CM | POA: Insufficient documentation

## 2014-10-27 DIAGNOSIS — Z85820 Personal history of malignant melanoma of skin: Secondary | ICD-10-CM | POA: Insufficient documentation

## 2014-10-27 DIAGNOSIS — G319 Degenerative disease of nervous system, unspecified: Secondary | ICD-10-CM | POA: Insufficient documentation

## 2014-10-27 DIAGNOSIS — R42 Dizziness and giddiness: Secondary | ICD-10-CM | POA: Diagnosis present

## 2014-10-27 DIAGNOSIS — H5581 Saccadic eye movements: Secondary | ICD-10-CM

## 2014-10-27 DIAGNOSIS — H0520 Unspecified exophthalmos: Secondary | ICD-10-CM | POA: Insufficient documentation

## 2014-10-27 DIAGNOSIS — R2681 Unsteadiness on feet: Secondary | ICD-10-CM | POA: Diagnosis not present

## 2014-10-27 DIAGNOSIS — R482 Apraxia: Secondary | ICD-10-CM

## 2014-10-28 ENCOUNTER — Telehealth: Payer: Self-pay | Admitting: *Deleted

## 2014-10-28 NOTE — Telephone Encounter (Signed)
Patient's daughter Brian Montgomery is aware MRI of brain shows no acute abnormalities that would be contributing to dizziness or gait problems. And that form is at front desk

## 2014-10-28 NOTE — Telephone Encounter (Signed)
-----   Message from Pieter Partridge, DO sent at 10/28/2014  6:52 AM EDT ----- MRI of brain shows no acute abnormalities that would be contributing to dizziness or gait problems. ----- Message -----    From: Rad Results In Interface    Sent: 10/27/2014   4:54 PM      To: Pieter Partridge, DO

## 2014-11-23 ENCOUNTER — Other Ambulatory Visit: Payer: Self-pay | Admitting: Family Medicine

## 2014-12-10 ENCOUNTER — Other Ambulatory Visit: Payer: Self-pay | Admitting: Medical

## 2015-01-14 ENCOUNTER — Ambulatory Visit (INDEPENDENT_AMBULATORY_CARE_PROVIDER_SITE_OTHER): Payer: Medicare Other | Admitting: Neurology

## 2015-01-14 ENCOUNTER — Encounter: Payer: Self-pay | Admitting: Neurology

## 2015-01-14 VITALS — BP 150/86 | HR 88 | Resp 20 | Ht 66.0 in | Wt 185.0 lb

## 2015-01-14 DIAGNOSIS — R2681 Unsteadiness on feet: Secondary | ICD-10-CM

## 2015-01-14 DIAGNOSIS — G609 Hereditary and idiopathic neuropathy, unspecified: Secondary | ICD-10-CM | POA: Insufficient documentation

## 2015-01-14 NOTE — Patient Instructions (Signed)
The primary problem with the balance may be due to neuropathy.  We likely will not find a cause for this.  I would continue the balance exercises daily.  Follow up in 6 months.

## 2015-01-14 NOTE — Progress Notes (Addendum)
NEUROLOGY FOLLOW UP OFFICE NOTE  Brian Montgomery [id number]  HISTORY OF PRESENT ILLNESS: Brian Montgomery is a 79 year old right-handed man with hypertension, dyslipidemia, and arthritis who follows up for gait instability.  MRI of the brain reviewed.  He is accompanied by his daughter who provides some history.  UPDATE: MRI of the brain from 10/27/14 showed mild to moderate chronic small vessel disease and mild global atrophy.  He finished PT.  He appeared to have made some improvements, but he doesn't notice it.  After discharge, he has not continued daily balance exercises.  He had one fall since last time, in which his legs just gave out.  Otherwise, he is doing well.  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  HISTORY: For about a year, he has had increased problems with balance.  When he gets up, he has to take a moment to maintain his balance.  He sometimes feels has to push off.  When he is walking, he feels like his body is being pulled to either side.  There is no spinning sensation, lightheadedness, nausea, vomiting, slurred speech, visual disturbance or numbness or weakness.  He denies neck pain, low back pain or pain in the legs when he walks or stands.  He has longstanding problems with hearing but no tinnitus.  Orthostatics were checked, which were negative.  He has had a couple of falls.    Labs, including TSH, glucose, ANA, Sed Rate, B12, SPEP/IFE were normal.    He does have history of low back surgery and left knee replacement.  PAST MEDICAL HISTORY: Past Medical History  Diagnosis Date  . ED (erectile dysfunction)   . Dyslipidemia   . Hypertension   . Arthritis     MEDICATIONS: Current Outpatient Prescriptions on File Prior to Visit  Medication Sig Dispense Refill  . aspirin 81 MG tablet Take 81 mg by mouth daily.    . fish oil-omega-3 fatty acids 1000 MG capsule Take 1 g by mouth daily.     Marland Kitchen HYDROcodone-acetaminophen (NORCO/VICODIN) 5-325 MG per tablet   0  .  lisinopril-hydrochlorothiazide (PRINZIDE,ZESTORETIC) 10-12.5 MG per tablet TAKE 1 TABLET BY MOUTH DAILY 90 tablet 0  . meloxicam (MOBIC) 15 MG tablet   2  . Multiple Vitamins-Minerals (MULTIVITAMIN WITH MINERALS) tablet Take 1 tablet by mouth daily.      . simvastatin (ZOCOR) 20 MG tablet TAKE 1 TABLET BY MOUTH DAILY 90 tablet 0  . tadalafil (CIALIS) 20 MG tablet Take 1 tablet (20 mg total) by mouth every other day as needed for erectile dysfunction. 3 tablet 0-   No current facility-administered medications on file prior to visit.    ALLERGIES: No Known Allergies  FAMILY HISTORY: Family History  Problem Relation Age of Onset  . Heart failure Mother   . Heart failure Father   . Hypertension Mother   . Hypertension Father   . Renal Disease Sister   . Diabetes Brother     SOCIAL HISTORY: History   Social History  . Marital Status: Married    Spouse Name: N/A  . Number of Children: N/A  . Years of Education: N/A   Occupational History  . Not on file.   Social History Main Topics  . Smoking status: Former Research scientist (life sciences)  . Smokeless tobacco: Never Used  . Alcohol Use: 0.0 oz/week    0 Standard drinks or equivalent per week     Comment: rare  . Drug Use: No  .  Sexual Activity:    Partners: Female   Other Topics Concern  . Not on file   Social History Narrative    REVIEW OF SYSTEMS: Constitutional: No fevers, chills, or sweats, no generalized fatigue, change in appetite Eyes: No visual changes, double vision, eye pain Ear, nose and throat: No hearing loss, ear pain, nasal congestion, sore throat Cardiovascular: No chest pain, palpitations Respiratory:  No shortness of breath at rest or with exertion, wheezes GastrointestinaI: No nausea, vomiting, diarrhea, abdominal pain, fecal incontinence Genitourinary:  No dysuria, urinary retention or frequency Musculoskeletal:  No neck pain, back pain Integumentary: No rash, pruritus, skin lesions Neurological: as  above Psychiatric: No depression, insomnia, anxiety Endocrine: No palpitations, fatigue, diaphoresis, mood swings, change in appetite, change in weight, increased thirst Hematologic/Lymphatic:  No anemia, purpura, petechiae. Allergic/Immunologic: no itchy/runny eyes, nasal congestion, recent allergic reactions, rashes  PHYSICAL EXAM: Filed Vitals:   01/14/15 1126  BP: 150/86  Pulse: 88  Resp: 20   General: No acute distress Head:  Normocephalic/atraumatic Eyes:  Fundoscopic exam unremarkable without vessel changes, exudates, hemorrhages or papilledema. Neck: supple, no paraspinal tenderness, full range of motion Heart:  Regular rate and rhythm Lungs:  Clear to auscultation bilaterally Back: No paraspinal tenderness Neurological Exam: alert and oriented to person, place, and time. Attention span and concentration intact, recalled 1 of 3 words after 5-10 minutes, remote memory intact, fund of knowledge intact.  Speech fluent and not dysarthric, language intact.  Saccadic eye movements on tracking.  Otherwise CN II-XII intact. Fundoscopic exam unremarkable without vessel changes, exudates, hemorrhages or papilledema.  Bulk and tone normal, muscle strength 5/5 throughout.  Reduced vibration sensation in the feet up to the ankles.  Pinprick sensation intact.  Deep tendon reflexes trace in upper extremities and absent in lower extremities, toes downgoing.  Finger to nose and heel to shin testing intact.  Gait wide-based with some staggering to either side.  Cannot tandem walk, Romberg with sway.  IMPRESSION: Gait instability.  He does seem to have an idiopathic peripheral neuropathy with significant vibratory sensory loss in the feet, which very well can be contributing.  PLAN: I would recommend resuming daily balance exercises Should recheck BP with PCP. Follow up in 6 months.  Metta Clines, DO  CC:  Jill Alexanders, MD

## 2015-02-22 ENCOUNTER — Other Ambulatory Visit: Payer: Self-pay | Admitting: Medical

## 2015-02-23 NOTE — Telephone Encounter (Signed)
Is this okay to refill? 

## 2015-02-27 ENCOUNTER — Other Ambulatory Visit: Payer: Self-pay | Admitting: Medical

## 2015-04-24 ENCOUNTER — Other Ambulatory Visit: Payer: Self-pay | Admitting: Medical

## 2015-04-24 ENCOUNTER — Telehealth: Payer: Self-pay | Admitting: Medical

## 2015-04-24 NOTE — Telephone Encounter (Signed)
Is this ok to refill?  

## 2015-04-24 NOTE — Telephone Encounter (Signed)
Needs physical appt 

## 2015-04-24 NOTE — Telephone Encounter (Signed)
Called to schedule cpe and pt wife said she would call back to make appt, had to check her work schedule, so she could come with him

## 2015-05-22 ENCOUNTER — Telehealth: Payer: Self-pay | Admitting: Family Medicine

## 2015-05-22 ENCOUNTER — Other Ambulatory Visit: Payer: Self-pay | Admitting: Family Medicine

## 2015-05-22 MED ORDER — MELOXICAM 15 MG PO TABS: 15.0000 mg | ORAL_TABLET | Freq: Every day | ORAL | Status: DC

## 2015-05-22 NOTE — Telephone Encounter (Signed)
Pt made a cpe appt for January. Needs refills on meloxicam sent to [facility] on [address].

## 2015-05-22 NOTE — Telephone Encounter (Signed)
Looks like our office didn't prescribe the meloxicam for him Dr. [name] did so should I refill this?

## 2015-05-22 NOTE — Telephone Encounter (Signed)
Is this okay?

## 2015-05-25 NOTE — Telephone Encounter (Signed)
Dr.Lalonde did you see this he has appointment 11/30

## 2015-05-26 ENCOUNTER — Telehealth: Payer: Self-pay

## 2015-05-26 NOTE — Telephone Encounter (Signed)
Talked to pt and rescheduled his physical for 06/25/15. He also had me call back and leave it on his voicemail so his daughter could know so she could come with him.

## 2015-05-28 ENCOUNTER — Telehealth: Payer: Self-pay

## 2015-05-28 MED ORDER — LISINOPRIL-HYDROCHLOROTHIAZIDE 10-12.5 MG PO TABS: 1.0000 | ORAL_TABLET | Freq: Every day | ORAL | Status: DC

## 2015-05-28 NOTE — Telephone Encounter (Signed)
Refill request for Lisinopril-HCTZ 10/12.5mg  #30

## 2015-06-10 ENCOUNTER — Ambulatory Visit: Payer: Medicare Other | Admitting: Family Medicine

## 2015-06-25 ENCOUNTER — Ambulatory Visit (INDEPENDENT_AMBULATORY_CARE_PROVIDER_SITE_OTHER): Payer: Medicare Other | Admitting: Family Medicine

## 2015-06-25 ENCOUNTER — Encounter: Payer: Self-pay | Admitting: Family Medicine

## 2015-06-25 VITALS — BP 124/82 | HR 64 | Ht 65.0 in | Wt 182.4 lb

## 2015-06-25 DIAGNOSIS — G609 Hereditary and idiopathic neuropathy, unspecified: Secondary | ICD-10-CM | POA: Diagnosis not present

## 2015-06-25 DIAGNOSIS — R2681 Unsteadiness on feet: Secondary | ICD-10-CM

## 2015-06-25 DIAGNOSIS — N3281 Overactive bladder: Secondary | ICD-10-CM

## 2015-06-25 DIAGNOSIS — E785 Hyperlipidemia, unspecified: Secondary | ICD-10-CM

## 2015-06-25 DIAGNOSIS — Z23 Encounter for immunization: Secondary | ICD-10-CM | POA: Diagnosis not present

## 2015-06-25 DIAGNOSIS — I10 Essential (primary) hypertension: Secondary | ICD-10-CM | POA: Diagnosis not present

## 2015-06-25 DIAGNOSIS — R269 Unspecified abnormalities of gait and mobility: Secondary | ICD-10-CM | POA: Diagnosis not present

## 2015-06-25 LAB — COMPREHENSIVE METABOLIC PANEL
ALT: 14 U/L (ref 9–46)
AST: 20 U/L (ref 10–35)
Albumin: 4.1 g/dL (ref 3.6–5.1)
Alkaline Phosphatase: 62 U/L (ref 40–115)
BUN: 19 mg/dL (ref 7–25)
CALCIUM: 9.6 mg/dL (ref 8.6–10.3)
CO2: 25 mmol/L (ref 20–31)
Chloride: 103 mmol/L (ref 98–110)
Creat: 1.22 mg/dL — ABNORMAL HIGH (ref 0.70–1.18)
GLUCOSE: 80 mg/dL (ref 65–99)
POTASSIUM: 3.9 mmol/L (ref 3.5–5.3)
Sodium: 138 mmol/L (ref 135–146)
Total Bilirubin: 0.5 mg/dL (ref 0.2–1.2)
Total Protein: 7 g/dL (ref 6.1–8.1)

## 2015-06-25 LAB — VITAMIN B12: VITAMIN B 12: 533 pg/mL (ref 211–911)

## 2015-06-25 LAB — CBC WITH DIFFERENTIAL/PLATELET
Basophils Absolute: 0 10*3/uL (ref 0.0–0.1)
Basophils Relative: 0 % (ref 0–1)
EOS ABS: 0.2 10*3/uL (ref 0.0–0.7)
EOS PCT: 2 % (ref 0–5)
HEMATOCRIT: 43.5 % (ref 39.0–52.0)
Hemoglobin: 14.7 g/dL (ref 13.0–17.0)
LYMPHS PCT: 26 % (ref 12–46)
Lymphs Abs: 2.5 10*3/uL (ref 0.7–4.0)
MCH: 29.9 pg (ref 26.0–34.0)
MCHC: 33.8 g/dL (ref 30.0–36.0)
MCV: 88.6 fL (ref 78.0–100.0)
MONO ABS: 0.6 10*3/uL (ref 0.1–1.0)
MPV: 9.9 fL (ref 8.6–12.4)
Monocytes Relative: 6 % (ref 3–12)
Neutro Abs: 6.5 10*3/uL (ref 1.7–7.7)
Neutrophils Relative %: 66 % (ref 43–77)
PLATELETS: 247 10*3/uL (ref 150–400)
RBC: 4.91 MIL/uL (ref 4.22–5.81)
RDW: 13.6 % (ref 11.5–15.5)
WBC: 9.8 10*3/uL (ref 4.0–10.5)

## 2015-06-25 LAB — LIPID PANEL
CHOL/HDL RATIO: 2.2 ratio (ref <=5.0)
Cholesterol: 180 mg/dL (ref 125–200)
HDL: 81 mg/dL (ref >=40)
LDL CALC: 88 mg/dL (ref <130)
Triglycerides: 56 mg/dL (ref <150)
VLDL: 11 mg/dL (ref <30)

## 2015-06-25 LAB — TSH: TSH: 2.315 u[IU]/mL (ref 0.350–4.500)

## 2015-06-25 MED ORDER — TERAZOSIN HCL 1 MG PO CAPS: 1.0000 mg | ORAL_CAPSULE | Freq: Every day | ORAL | Status: DC

## 2015-06-25 NOTE — Progress Notes (Signed)
Subjective:    Patient ID: Brian Montgomery, male    DOB: [birth year], 79 y.o.   MRN: [id number]  HPI  he is here for complete examination. Does have a gait disturbance and has been seen by neurology. That note was reviewed. This was roughly 6 months ago. He has also had physical therapy to help with this. He has fallen several times due to this. He and his wife both say that he has to think about his walking. 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 Continues on his blood pressure medication as well as simvastatin. He does use Mobic but cannot give a good reason as to why he is using this.  Also complains of a several month history of feeling of intermittent incomplete emptying, urgency and  Case no difficulty with incontinence. He has nocturia 2. He does not complain of hesitancy, decreased stream. He has had no difficulty with chest pain, shortness of breath, abdominal issues. Family and social history as well as health maintenance and immunizations was reviewed. He apparently did have a is vaccine done.  Review of Systems  All other systems reviewed and are negative.      Objective:   Physical Exam BP 124/82 mmHg  Pulse 64  Ht 5\' 5"  (1.651 m)  Wt 182 lb 6.4 oz (82.736 kg)  BMI 30.35 kg/m2  General Appearance:    Alert, cooperative, no distress, appears stated age  Head:    Normocephalic, without obvious abnormality, atraumatic  Eyes:    PERRL, conjunctiva/corneas clear, EOM's intact, fundi    benign  Ears:    Normal TM's and external ear canals  Nose:   Nares normal, mucosa normal, no drainage or sinus   tenderness  Throat:   Lips, mucosa, and tongue normal; teeth and gums normal  Neck:   Supple, no lymphadenopathy;  thyroid:  no   enlargement/tenderness/nodules; no carotid   bruit or JVD  Back:    Spine nontender, no curvature, ROM normal, no CVA     tenderness  Lungs:     Clear to auscultation bilaterally without wheezes, rales or     ronchi; respirations unlabored  Chest  Wall:    No tenderness or deformity   Heart:    Regular rate and rhythm, S1 and S2 normal, no murmur, rub   or gallop  Breast Exam:    No chest wall tenderness, masses or gynecomastia  Abdomen:     Soft, non-tender, nondistended, normoactive bowel sounds,    no masses, no hepatosplenomegaly  Genitalia:    Normal male external genitalia without lesions.  Testicles without masses.  No inguinal hernias.     Extremities:   No clubbing, cyanosis or edema  Pulses:   2+ and symmetric all extremities  Skin:   Skin color, texture, turgor normal, no rashes or lesions  Lymph nodes:   Cervical, supraclavicular, and axillary nodes normal  Neurologic:   CNII-XII intact, normal strength, sensation and gait; reflexes 2+ and symmetric throughout          Psych:   Normal mood, affect, hygiene and grooming.          Assessment & Plan:  Need for prophylactic vaccination and inoculation against influenza - Plan: Flu vaccine HIGH DOSE PF (Fluzone High dose)  Essential hypertension, benign - Plan: CBC with Differential/Platelet, Comprehensive metabolic panel  Gait instability - Plan: Vitamin B12, CBC with Differential/Platelet, Comprehensive metabolic panel, RPR, TSH  Hereditary and idiopathic peripheral neuropathy  Hyperlipidemia  with target LDL less than 100 - Plan: Lipid panel  OAB (overactive bladder) - Plan: terazosin (HYTRIN) 1 MG capsule  he is scheduled to see neurology and therefore the neurologic evaluation was deferred. I will do routine blood screening nature there are no underlying medical issues causing instability. I will also place him on Hytrin. They're to call back in one to 2 weeks and let me know how this is doing for his bladder symptoms.

## 2015-06-25 NOTE — Patient Instructions (Signed)
Get a Med Alert

## 2015-06-26 LAB — RPR

## 2015-07-22 ENCOUNTER — Encounter: Payer: Medicare Other | Admitting: Family Medicine

## 2015-07-27 ENCOUNTER — Encounter: Payer: Self-pay | Admitting: Neurology

## 2015-07-27 ENCOUNTER — Ambulatory Visit (INDEPENDENT_AMBULATORY_CARE_PROVIDER_SITE_OTHER): Payer: Medicare Other | Admitting: Neurology

## 2015-07-27 ENCOUNTER — Other Ambulatory Visit: Payer: Self-pay | Admitting: Neurology

## 2015-07-27 VITALS — BP 134/72 | HR 79 | Ht 65.0 in | Wt 185.0 lb

## 2015-07-27 DIAGNOSIS — R2681 Unsteadiness on feet: Secondary | ICD-10-CM

## 2015-07-27 DIAGNOSIS — G231 Progressive supranuclear ophthalmoplegia [Steele-Richardson-Olszewski]: Secondary | ICD-10-CM | POA: Diagnosis not present

## 2015-07-27 DIAGNOSIS — G609 Hereditary and idiopathic neuropathy, unspecified: Secondary | ICD-10-CM | POA: Diagnosis not present

## 2015-07-27 MED ORDER — DONEPEZIL HCL 5 MG PO TABS: 5.0000 mg | ORAL_TABLET | Freq: Every day | ORAL | Status: DC

## 2015-07-27 NOTE — Patient Instructions (Addendum)
I think Brian Montgomery may have a condition called supranuclear palsy.  However, neuropathy in the feet may be playing a role in the balance problems as well. 1.  I want to get an MRI of the cervical spine to rule out any pinching of the spinal cord, which may also cause balance problems 2.  For memory, we will start donepezil (Aricept) 5mg  daily for four weeks.  If you are tolerating the medication, then after four weeks, we will increase the dose to 10mg  daily.  Side effects include nausea, vomiting, diarrhea, vivid dreams, and muscle cramps.  Please call the clinic if you experience any of these symptoms. 3.  We will refer to physical therapy again for balance.  I would think that he needs to use an assisted device such as a cane or walker. 4.  Follow up in 6 months. Progressive Supranuclear Palsy Progressive supranuclear palsy is a rare brain disorder that causes a gradual deterioration of cells at the base of the brain. It causes serious problems with walking, eye movement, and balance. It may also cause behavior changes, depression, and loss of mental capacity (dementia).  Progressive supranuclear palsy tends to get worse over time.  CAUSES The exact cause of progressive supranuclear palsy is not known. In some cases, the condition may be caused by genes passed down through families (inherited). RISK FACTORS Progressive supranuclear palsy usually begins after age 3 and is slightly more common in men. SIGNS AND SYMPTOMS  The pattern of signs and symptoms can vary greatly from person to person. Signs and symptoms may include:  Loss of balance while walking.  Walking that is stiff and awkward.  Unexplained falls.  Inability to focus the eyes properly or blurred vision.  Changes in mood and behavior. These include:  Lack of feeling or emotion (apathy).  Irritability.  Sudden laughing or crying.  Personality changes.  Progressive mild dementia.  Difficulty swallowing and  speaking. DIAGNOSIS  Your health care provider can diagnose progressive supranuclear palsy from your signs and symptoms. The main symptoms your health care provider will check for are:  Poor balance.  Vision problems.  Slurred speech.  Mental or behavioral changes. Your health care provider may also do some tests to rule out other causes of your symptoms. This may include checking your spinal fluid for abnormal proteins and taking images of your brain to look for areas of nerve cell loss. TREATMENT There is no cure for progressive supranuclear palsy. No treatment will slow the progression of the disease. Your treatment will be based on your symptoms. Treatment may include:  Medicines for Parkinson's disease. These may help with slowness, stiffness, and balance problems.  Antidepressant medicines to treat depression.  Glasses called prisms to help with blurry vision.  Walking aids to prevent falls.  A surgical procedure to place a feeding tube into your stomach (gastrostomy) if swallowing becomes very hard. HOME CARE INSTRUCTIONS Work closely with your team of health care providers. Your team may include:  A physical therapist. Have him or her help you come up with a safe exercise program.  A speech and language therapist. Have him or her teach you ways to swallow foods and liquids safely. This specialist can also help you speak more clearly.  An occupational therapist. Have him or her help you learn to use walking aids and make your home safe. SEEK MEDICAL CARE IF:  You have chills or fever.  Your symptoms are getting worse.  You develop new symptoms.  You  are having trouble getting enough nutrition.  You have a cough that will not go away.  You have shortness of breath.  You are anxious or depressed.  You are not getting enough support at home. SEEK IMMEDIATE MEDICAL CARE IF:  You choke, cough, or have trouble breathing after eating or drinking.  You hurt  yourself in a fall.  You no longer feel safe at home.   This information is not intended to replace advice given to you by your health care provider. Make sure you discuss any questions you have with your health care provider.   Document Released: 06/17/2002 Document Revised: 07/18/2014 Document Reviewed: 06/26/2013 Elsevier Interactive Patient Education [employer].

## 2015-07-27 NOTE — Progress Notes (Signed)
NEUROLOGY FOLLOW UP OFFICE NOTE  Brian Montgomery [id number]  HISTORY OF PRESENT ILLNESS: Brian Montgomery is a 80 year old right-handed man with hypertension, dyslipidemia, and arthritis who follows up for gait instability.  He is accompanied by his daughters who provides some history.  UPDATE: Following PT, he has not had any significant improvement in gait.  Some days are better than others.  Since last visit, he has had at least 2 falls.  His daughters do not notice any significant memory problems.  His wife pays the bills.  He is able to perform all of his ADLs.  He does have some urinary frequency but has BPH.  He does have symptoms suggestive of REM sleep behavior disorder (he will scream and thrash around in his sleep, one time falling out of bed).  He denies tremor.  HISTORY: For about a year, he has had increased problems with balance.  When he gets up, he has to take a moment to maintain his balance.  He sometimes feels has to push off.  When he is walking, he feels like his body is being pulled to either side.  There is no spinning sensation, lightheadedness, nausea, vomiting, slurred speech, visual disturbance or numbness or weakness.  He denies neck pain, low back pain or pain in the legs when he walks or stands.  He has longstanding problems with hearing but no tinnitus.  Orthostatics were checked, which were negative.  He has had a couple of falls.    MRI of the brain from 10/27/14 showed mild to moderate chronic small vessel disease and mild global atrophy.   Labs, including TSH, glucose, ANA, Sed Rate, B12, RPR, SPEP/IFE were normal.    He does have history of low back surgery and left knee replacement.  PAST MEDICAL HISTORY: Past Medical History  Diagnosis Date  . ED (erectile dysfunction)   . Dyslipidemia   . Hypertension   . Arthritis     MEDICATIONS: Current Outpatient Prescriptions on File Prior to Visit  Medication Sig Dispense Refill  . aspirin 81 MG tablet Take 81 mg  by mouth daily.    Marland Kitchen lisinopril-hydrochlorothiazide (PRINZIDE,ZESTORETIC) 10-12.5 MG tablet Take 1 tablet by mouth daily. 90 tablet 3  . meloxicam (MOBIC) 15 MG tablet TAKE 1 TABLET(15 MG) BY MOUTH DAILY 90 tablet 1  . simvastatin (ZOCOR) 20 MG tablet TAKE 1 TABLET BY MOUTH DAILY 30 tablet 0  . tadalafil (CIALIS) 20 MG tablet Take 1 tablet (20 mg total) by mouth every other day as needed for erectile dysfunction. 3 tablet 0-  . terazosin (HYTRIN) 1 MG capsule Take 1 capsule (1 mg total) by mouth at bedtime. 30 capsule 11   No current facility-administered medications on file prior to visit.    ALLERGIES: No Known Allergies  FAMILY HISTORY: Family History  Problem Relation Age of Onset  . Heart failure Mother   . Heart failure Father   . Hypertension Mother   . Hypertension Father   . Renal Disease Sister   . Diabetes Brother     SOCIAL HISTORY: Social History   Social History  . Marital Status: Married    Spouse Name: N/A  . Number of Children: N/A  . Years of Education: N/A   Occupational History  . Not on file.   Social History Main Topics  . Smoking status: Former Research scientist (life sciences)  . Smokeless tobacco: Never Used  . Alcohol Use: 0.0 oz/week    0 Standard drinks or equivalent per week  Comment: rare  . Drug Use: No  . Sexual Activity:    Partners: Female   Other Topics Concern  . Not on file   Social History Narrative    REVIEW OF SYSTEMS: Constitutional: No fevers, chills, or sweats, no generalized fatigue, change in appetite Eyes: No visual changes, double vision, eye pain Ear, nose and throat: No hearing loss, ear pain, nasal congestion, sore throat Cardiovascular: No chest pain, palpitations Respiratory:  No shortness of breath at rest or with exertion, wheezes GastrointestinaI: No nausea, vomiting, diarrhea, abdominal pain, fecal incontinence Genitourinary:  No dysuria, urinary retention or frequency Musculoskeletal:  No neck pain, back pain Integumentary:  No rash, pruritus, skin lesions Neurological: as above Psychiatric: No depression, insomnia, anxiety Endocrine: No palpitations, fatigue, diaphoresis, mood swings, change in appetite, change in weight, increased thirst Hematologic/Lymphatic:  No anemia, purpura, petechiae. Allergic/Immunologic: no itchy/runny eyes, nasal congestion, recent allergic reactions, rashes  PHYSICAL EXAM: Filed Vitals:   07/27/15 1039  BP: 134/72  Pulse: 79   General: No acute distress.  Patient appears well-groomed.  normal body habitus. Head:  Normocephalic/atraumatic Eyes:  Not able to visualize fundi Neck: supple, no paraspinal tenderness, full range of motion Heart:  Regular rate and rhythm Lungs:  Clear to auscultation bilaterally Back: No paraspinal tenderness Neurological Exam: alert and oriented to person, place, and time. Attention span and concentration intact, recent and remote memory intact, fund of knowledge intact.  Speech fluent and not dysarthric, language intact.   Montreal Cognitive Assessment  07/27/2015  Visuospatial/ Executive (0/5) 1  Naming (0/3) 2  Attention: Read list of digits (0/2) 1  Attention: Read list of letters (0/1) 1  Attention: Serial 7 subtraction starting at 100 (0/3) 0  Language: Repeat phrase (0/2) 0  Language : Fluency (0/1) 0  Abstraction (0/2) 0  Delayed Recall (0/5) 0  Orientation (0/6) 4  Total 9  Adjusted Score (based on education) 10   Decreased gaze in all directions, particularly upward and downward gaze.  Otherwise, CN II-XII intact. Bulk and tone normal, muscle strength 5/5 throughout.  Decreased finger-thumb tapping speed and amplitude but otherwise, no obvious signs of bradykinesia.  No cogwheel rigidity or tremor.  Sensation to pinprick intact but vibration sensation depressed in feet.  Deep tendon reflexes trace in upper extremities and absent in lower extremities.  Finger to nose and heel to shin testing intact.  Wide-based gait with staggering.   Requires several steps to turn around.  Cannot tandem walk.  Romberg positive.  IMPRESSION: Gait instability.  He appears to clearly have idiopathic peripheral neuropathy.  However, he may possibly have progressive supranuclear palsy, given his cognitive impairment, ophthalmoplegia, history consistent with REM sleep behavior disorder, and gait instability.  I would like to rule out cervical stenosis as possible cause for gait instability  PLAN: 1.  We will get MRI of cervical spine to rule out cervical stenosis with myelopathy 2.  We will initiate Aricept 3.  We will refer him again for PT.  I think he will need some sort of assisted device, such as cane or walker 4.  Follow up in 6 months.  28 minutes spent face to face with patient, over 50% spent discussing differential diagnosis and management.  Metta Clines, DO  CC:  Jill Alexanders, MD

## 2015-08-02 ENCOUNTER — Other Ambulatory Visit: Payer: Self-pay | Admitting: Family Medicine

## 2015-08-05 ENCOUNTER — Other Ambulatory Visit: Payer: Self-pay | Admitting: Family Medicine

## 2015-08-05 NOTE — Telephone Encounter (Signed)
Is this ok to refill?  

## 2015-08-14 ENCOUNTER — Ambulatory Visit (HOSPITAL_COMMUNITY): Payer: BLUE CROSS/BLUE SHIELD

## 2015-08-15 ENCOUNTER — Other Ambulatory Visit: Payer: Self-pay | Admitting: Medical

## 2015-08-18 ENCOUNTER — Ambulatory Visit (HOSPITAL_COMMUNITY): Payer: Medicare Other

## 2015-08-19 ENCOUNTER — Encounter: Payer: Self-pay | Admitting: Neurology

## 2015-09-01 ENCOUNTER — Ambulatory Visit (HOSPITAL_COMMUNITY)
Admission: RE | Admit: 2015-09-01 | Discharge: 2015-09-01 | Disposition: A | Discharge status: Home / Self Care | Payer: Medicare Other | Source: Ambulatory Visit | Attending: Neurology | Admitting: Neurology

## 2015-09-01 DIAGNOSIS — R2681 Unsteadiness on feet: Secondary | ICD-10-CM | POA: Diagnosis not present

## 2015-09-01 DIAGNOSIS — G231 Progressive supranuclear ophthalmoplegia [Steele-Richardson-Olszewski]: Secondary | ICD-10-CM

## 2015-09-01 DIAGNOSIS — M47892 Other spondylosis, cervical region: Secondary | ICD-10-CM | POA: Insufficient documentation

## 2015-09-01 DIAGNOSIS — M4802 Spinal stenosis, cervical region: Secondary | ICD-10-CM | POA: Diagnosis not present

## 2015-09-01 DIAGNOSIS — M2578 Osteophyte, vertebrae: Secondary | ICD-10-CM | POA: Insufficient documentation

## 2015-09-01 DIAGNOSIS — M8938 Hypertrophy of bone, other site: Secondary | ICD-10-CM | POA: Insufficient documentation

## 2015-09-01 DIAGNOSIS — M4322 Fusion of spine, cervical region: Secondary | ICD-10-CM | POA: Diagnosis not present

## 2015-09-02 ENCOUNTER — Telehealth: Payer: Self-pay

## 2015-09-02 NOTE — Telephone Encounter (Signed)
Message relayed to patient.  Verbalized understanding.

## 2015-09-02 NOTE — Telephone Encounter (Signed)
-----   Message from Pieter Partridge, DO sent at 09/01/2015  6:54 PM EST ----- MRI does not show anything in the neck that would be causing problems with balance

## 2015-09-05 ENCOUNTER — Other Ambulatory Visit: Payer: Self-pay | Admitting: Family Medicine

## 2015-09-24 DIAGNOSIS — L299 Pruritus, unspecified: Secondary | ICD-10-CM | POA: Insufficient documentation

## 2015-09-24 DIAGNOSIS — R270 Ataxia, unspecified: Secondary | ICD-10-CM | POA: Insufficient documentation

## 2015-09-24 DIAGNOSIS — H938X3 Other specified disorders of ear, bilateral: Secondary | ICD-10-CM | POA: Diagnosis not present

## 2015-10-08 ENCOUNTER — Other Ambulatory Visit: Payer: Self-pay | Admitting: Family Medicine

## 2015-10-08 NOTE — Telephone Encounter (Signed)
Is this okay to refill? 

## 2015-10-19 ENCOUNTER — Ambulatory Visit (INDEPENDENT_AMBULATORY_CARE_PROVIDER_SITE_OTHER): Payer: Medicare Other | Admitting: Family Medicine

## 2015-10-19 ENCOUNTER — Encounter: Payer: Self-pay | Admitting: Family Medicine

## 2015-10-19 VITALS — BP 120/70 | HR 84 | Temp 98.0°F | Ht 65.0 in | Wt 184.6 lb

## 2015-10-19 DIAGNOSIS — N410 Acute prostatitis: Secondary | ICD-10-CM

## 2015-10-19 DIAGNOSIS — R319 Hematuria, unspecified: Secondary | ICD-10-CM

## 2015-10-19 LAB — POCT URINALYSIS DIPSTICK
Bilirubin, UA: NEGATIVE
Glucose, UA: NEGATIVE
Ketones, UA: NEGATIVE
NITRITE UA: POSITIVE
PH UA: 6
UROBILINOGEN UA: NEGATIVE

## 2015-10-19 LAB — HEMOCCULT GUIAC POC 1CARD (OFFICE)

## 2015-10-19 MED ORDER — SULFAMETHOXAZOLE-TRIMETHOPRIM 800-160 MG PO TABS: 1.0000 | ORAL_TABLET | Freq: Two times a day (BID) | ORAL | Status: DC

## 2015-10-19 NOTE — Progress Notes (Signed)
   Subjective:    Patient ID: Brian Montgomery, male    DOB: March 30, 1936, 80 y.o.   MRN: [id number]  HPI He complains of difficulty over the last 3 weeks of seeing blood in his urine but no fever, chills, abdominal pain, dysuria. He does complain of frequency but this is been going on for a long period time.   Review of Systems     Objective:   Physical Exam Alert and in no distress. Abdominal exam shows no masses or tenderness. Rectal exam shows a slightly large prostate but nontender to palpation. Urine microscopic and dipstick was positive.       Assessment & Plan:  Hematuria - Plan: POCT Urinalysis Dipstick, sulfamethoxazole-trimethoprim (BACTRIM DS,SEPTRA DS) 800-160 MG tablet, CULTURE, URINE COMPREHENSIVE, CANCELED: CULTURE, URINE COMPREHENSIVE  Acute prostatitis - Plan: sulfamethoxazole-trimethoprim (BACTRIM DS,SEPTRA DS) 800-160 MG tablet, CULTURE, URINE COMPREHENSIVE, CANCELED: CULTURE, URINE COMPREHENSIVE Although his prostate was not inflamed, I will treat him for this. He is to return here in 2 weeks for recheck. Culture also taken.

## 2015-10-22 LAB — CULTURE, URINE COMPREHENSIVE

## 2015-11-08 ENCOUNTER — Other Ambulatory Visit: Payer: Self-pay | Admitting: Family Medicine

## 2015-11-09 NOTE — Telephone Encounter (Signed)
Is this okay to refill? 

## 2015-11-12 ENCOUNTER — Ambulatory Visit (INDEPENDENT_AMBULATORY_CARE_PROVIDER_SITE_OTHER): Payer: Medicare Other | Admitting: Family Medicine

## 2015-11-12 VITALS — BP 110/70 | HR 71

## 2015-11-12 DIAGNOSIS — N410 Acute prostatitis: Secondary | ICD-10-CM

## 2015-11-12 DIAGNOSIS — R319 Hematuria, unspecified: Secondary | ICD-10-CM | POA: Diagnosis not present

## 2015-11-12 LAB — POCT URINALYSIS DIPSTICK
Bilirubin, UA: NEGATIVE
GLUCOSE UA: NEGATIVE
Ketones, UA: NEGATIVE
Nitrite, UA: NEGATIVE
UROBILINOGEN UA: NEGATIVE
pH, UA: 6

## 2015-11-12 MED ORDER — LEVOFLOXACIN 500 MG PO TABS: 500.0000 mg | ORAL_TABLET | Freq: Every day | ORAL | Status: DC

## 2015-11-12 NOTE — Progress Notes (Signed)
   Subjective:    Patient ID: Brian Montgomery, male    DOB: 01-26-1936, 80 y.o.   MRN: [id number]  HPI He is here for a recheck. He has had no fever, chills, abdominal pain. He really has no urinary symptoms and has not seen blood in his urine. Recent culture was positive and sensitive to all medications. His wife is also concerned about his gait.   Review of Systems     Objective:   Physical Exam Alert and in no distress. Urine dipstick was positive for red cells.       Assessment & Plan:  Hematuria - Plan: POCT Urinalysis Dipstick, levofloxacin (LEVAQUIN) 500 MG tablet  Acute prostatitis - Plan: levofloxacin (LEVAQUIN) 500 MG tablet We'll switch him to Levaquin after continued difficulty, refer to urology. Discussed the gait problem with her. She was interested in a muscle relaxer and I explained that would not be appropriate and the circumstances and would make him more prone to falling.

## 2015-11-12 NOTE — Patient Instructions (Signed)
No naps during the day and that way he will sleep better at night

## 2015-11-14 ENCOUNTER — Emergency Department (HOSPITAL_COMMUNITY): Payer: Medicare Other

## 2015-11-14 ENCOUNTER — Encounter (HOSPITAL_COMMUNITY): Payer: Self-pay

## 2015-11-14 ENCOUNTER — Emergency Department (HOSPITAL_COMMUNITY)
Admission: EM | Admit: 2015-11-14 | Discharge: 2015-11-14 | Disposition: A | Discharge status: Home / Self Care | Payer: Medicare Other | Attending: Emergency Medicine | Admitting: Emergency Medicine

## 2015-11-14 DIAGNOSIS — Y9289 Other specified places as the place of occurrence of the external cause: Secondary | ICD-10-CM | POA: Insufficient documentation

## 2015-11-14 DIAGNOSIS — Z7982 Long term (current) use of aspirin: Secondary | ICD-10-CM | POA: Insufficient documentation

## 2015-11-14 DIAGNOSIS — M1990 Unspecified osteoarthritis, unspecified site: Secondary | ICD-10-CM | POA: Diagnosis not present

## 2015-11-14 DIAGNOSIS — Z79899 Other long term (current) drug therapy: Secondary | ICD-10-CM | POA: Diagnosis not present

## 2015-11-14 DIAGNOSIS — S43402A Unspecified sprain of left shoulder joint, initial encounter: Secondary | ICD-10-CM | POA: Insufficient documentation

## 2015-11-14 DIAGNOSIS — I10 Essential (primary) hypertension: Secondary | ICD-10-CM | POA: Insufficient documentation

## 2015-11-14 DIAGNOSIS — Y998 Other external cause status: Secondary | ICD-10-CM | POA: Insufficient documentation

## 2015-11-14 DIAGNOSIS — Z792 Long term (current) use of antibiotics: Secondary | ICD-10-CM | POA: Diagnosis not present

## 2015-11-14 DIAGNOSIS — S4992XA Unspecified injury of left shoulder and upper arm, initial encounter: Secondary | ICD-10-CM | POA: Diagnosis present

## 2015-11-14 DIAGNOSIS — E785 Hyperlipidemia, unspecified: Secondary | ICD-10-CM | POA: Diagnosis not present

## 2015-11-14 DIAGNOSIS — Y9389 Activity, other specified: Secondary | ICD-10-CM | POA: Insufficient documentation

## 2015-11-14 DIAGNOSIS — Z791 Long term (current) use of non-steroidal anti-inflammatories (NSAID): Secondary | ICD-10-CM | POA: Diagnosis not present

## 2015-11-14 DIAGNOSIS — W010XXA Fall on same level from slipping, tripping and stumbling without subsequent striking against object, initial encounter: Secondary | ICD-10-CM | POA: Diagnosis not present

## 2015-11-14 DIAGNOSIS — Z87438 Personal history of other diseases of male genital organs: Secondary | ICD-10-CM | POA: Insufficient documentation

## 2015-11-14 DIAGNOSIS — M25512 Pain in left shoulder: Secondary | ICD-10-CM | POA: Diagnosis not present

## 2015-11-14 MED ORDER — ACETAMINOPHEN 325 MG PO TABS
650.0000 mg | ORAL_TABLET | Freq: Once | ORAL | Status: AC
  Administered 2015-11-14: 650 mg via ORAL
  Filled 2015-11-14: qty 2

## 2015-11-14 NOTE — ED Notes (Signed)
Pt given Ice pack for shoulder. Ambulated in hall with cane.

## 2015-11-14 NOTE — Discharge Instructions (Signed)
It was our pleasure to provide your ER care today - we hope that you feel better.  Wear sling for comfort/support for the next few days.  Ice/coldpack to sore area.  Take tylenol/motrin as need for pain.  Follow up with your orthopedist in coming week if symptoms fail to improve/resolve.  Return to ER if worse, new/severe pain, fevers, numbness/weakness, recurrent falls, other concern.     Shoulder Sprain A shoulder sprain is a partial or complete tear in one of the tough, fiber-like tissues (ligaments) in the shoulder. The ligaments in the shoulder help to hold the shoulder in place. CAUSES This condition may be caused by:  A fall.  A hit to the shoulder.  A twist of the arm. RISK FACTORS This condition is more likely to develop in:  People who play sports.  People who have problems with balance or coordination. SYMPTOMS Symptoms of this condition include:  Pain when moving the shoulder.  Limited ability to move the shoulder.  Swelling and tenderness on top of the shoulder.  Warmth in the shoulder.  A change in the shape of the shoulder.  Redness or bruising on the shoulder. DIAGNOSIS This condition is diagnosed with a physical exam. During the exam, you may be asked to do simple exercises with your shoulder. You may also have imaging tests, such as X-rays, MRI, or a CT scan. These tests can show how severe the sprain is. TREATMENT This condition may be treated with:  Rest.  Pain medicine.  Ice.  A sling or brace. This is used to keep the arm still while the shoulder is healing.  Physical therapy or rehabilitation exercises. These help to improve the range of motion and strength of the shoulder.  Surgery (rare). Surgery may be needed if the sprain caused a joint to become unstable. Surgery may also be needed to reduce pain. Some people may develop ongoing shoulder pain or lose some range of motion in the shoulder. However, most people do not develop  long-term problems. HOME CARE INSTRUCTIONS  Rest.  Take over-the-counter and prescription medicines only as told by your health care provider.  If directed, apply ice to the area:  Put ice in a plastic bag.  Place a towel between your skin and the bag.  Leave the ice on for 20 minutes, 2-3 times per day.  If you were given a shoulder sling or brace:  Wear it as told.  Remove it to shower or bathe.  Move your arm only as much as told by your health care provider, but keep your hand moving to prevent swelling.  If you were shown how to do any exercises, do them as told by your health care provider.  Keep all follow-up visits as told by your health care provider. This is important. SEEK MEDICAL CARE IF:  Your pain gets worse.  Your pain is not relieved with medicines.  You have increased redness or swelling. SEEK IMMEDIATE MEDICAL CARE IF:  You have a fever.  You cannot move your arm or shoulder.  You develop numbness or tingling in your arms, hands, or fingers.   This information is not intended to replace advice given to you by your health care provider. Make sure you discuss any questions you have with your health care provider.   Document Released: 11/13/2008 Document Revised: 03/18/2015 Document Reviewed: 10/20/2014 Elsevier Interactive Patient Education 2016 [employer].    How to Use a Sling A sling is a type of hanging bandage. You  wear it around your neck to protect an injured arm, shoulder, or other body part. You may need to wear a sling to keep you from moving the injured body part while it heals. Keeping the injured part of your body still reduces pain and speeds up healing. Your doctor may suggest you use a sling if you have:  A broken arm.  A broken collarbone.  A shoulder injury.  Surgery. RISKS AND COMPLICATIONS Wearing a sling the wrong way can:  Make your injury worse.  Cause stiffness or numbness.  Affect blood circulation in your  arm and hand. This can cause tingling or numbness in your fingers or hands. HOW TO USE A SLING The way that you should use a sling depends on your injury. It is important that you follow all of your doctor's instructions for your injury. Also follow these general suggestions:  Wear the sling so that your arm bends 90 degrees at the elbow. That is like a right angle or the shape of a capital letter "L." The sling should also support your wrist and hand.  Try not to move your arm.  Do not lie down flat on your back while you have to wear a sling. Sleep in a recliner or use pillows to raise your upper body in bed.  Do not twist, raise, or move your arm in a way that could make your injury worse.  Do not lean on your arm while you have to wear a sling.  Do not lift anything while you have to wear a sling. GET HELP IF:  You have bruising, swelling, or pain that is getting worse.  Your pain medicine is not helping.  You have a fever. GET HELP RIGHT AWAY IF:  Your fingers are numb or tingling.  Your fingers turn blue or feel cold to the touch.  You cannot control the bleeding from your injury.  You are short of breath.   This information is not intended to replace advice given to you by your health care provider. Make sure you discuss any questions you have with your health care provider.   Document Released: 09/21/2009 Document Revised: 07/18/2014 Document Reviewed: 04/30/2014 Elsevier Interactive Patient Education 2016 [place].    Cryotherapy Cryotherapy means treatment with cold. Ice or gel packs can be used to reduce both pain and swelling. Ice is the most helpful within the first 24 to 48 hours after an injury or flare-up from overusing a muscle or joint. Sprains, strains, spasms, burning pain, shooting pain, and aches can all be eased with ice. Ice can also be used when recovering from surgery. Ice is effective, has very few side effects, and is safe for most people to  use. PRECAUTIONS  Ice is not a safe treatment option for people with:  Raynaud phenomenon. This is a condition affecting small blood vessels in the extremities. Exposure to cold may cause your problems to return.  Cold hypersensitivity. There are many forms of cold hypersensitivity, including:  Cold urticaria. Red, itchy hives appear on the skin when the tissues begin to warm after being iced.  Cold erythema. This is a red, itchy rash caused by exposure to cold.  Cold hemoglobinuria. Red blood cells break down when the tissues begin to warm after being iced. The hemoglobin that carry oxygen are passed into the urine because they cannot combine with blood proteins fast enough.  Numbness or altered sensitivity in the area being iced. If you have any of the following conditions,  do not use ice until you have discussed cryotherapy with your caregiver:  Heart conditions, such as arrhythmia, angina, or chronic heart disease.  High blood pressure.  Healing wounds or open skin in the area being iced.  Current infections.  Rheumatoid arthritis.  Poor circulation.  Diabetes. Ice slows the blood flow in the region it is applied. This is beneficial when trying to stop inflamed tissues from spreading irritating chemicals to surrounding tissues. However, if you expose your skin to cold temperatures for too long or without the proper protection, you can damage your skin or nerves. Watch for signs of skin damage due to cold. HOME CARE INSTRUCTIONS Follow these tips to use ice and cold packs safely.  Place a dry or damp towel between the ice and skin. A damp towel will cool the skin more quickly, so you may need to shorten the time that the ice is used.  For a more rapid response, add gentle compression to the ice.  Ice for no more than 10 to 20 minutes at a time. The bonier the area you are icing, the less time it will take to get the benefits of ice.  Check your skin after 5 minutes to make  sure there are no signs of a poor response to cold or skin damage.  Rest 20 minutes or more between uses.  Once your skin is numb, you can end your treatment. You can test numbness by very lightly touching your skin. The touch should be so light that you do not see the skin dimple from the pressure of your fingertip. When using ice, most people will feel these normal sensations in this order: cold, burning, aching, and numbness.  Do not use ice on someone who cannot communicate their responses to pain, such as small children or people with dementia. HOW TO MAKE AN ICE PACK Ice packs are the most common way to use ice therapy. Other methods include ice massage, ice baths, and cryosprays. Muscle creams that cause a cold, tingly feeling do not offer the same benefits that ice offers and should not be used as a substitute unless recommended by your caregiver. To make an ice pack, do one of the following:  Place crushed ice or a bag of frozen vegetables in a sealable plastic bag. Squeeze out the excess air. Place this bag inside another plastic bag. Slide the bag into a pillowcase or place a damp towel between your skin and the bag.  Mix 3 parts water with 1 part rubbing alcohol. Freeze the mixture in a sealable plastic bag. When you remove the mixture from the freezer, it will be slushy. Squeeze out the excess air. Place this bag inside another plastic bag. Slide the bag into a pillowcase or place a damp towel between your skin and the bag. SEEK MEDICAL CARE IF:  You develop white spots on your skin. This may give the skin a blotchy (mottled) appearance.  Your skin turns blue or pale.  Your skin becomes waxy or hard.  Your swelling gets worse. MAKE SURE YOU:   Understand these instructions.  Will watch your condition.  Will get help right away if you are not doing well or get worse.   This information is not intended to replace advice given to you by your health care provider. Make sure  you discuss any questions you have with your health care provider.   Document Released: 02/21/2011 Document Revised: 07/18/2014 Document Reviewed: 02/21/2011 Elsevier Interactive Patient Education [employer].

## 2015-11-14 NOTE — ED Notes (Signed)
Patient here with left shoulder pain after slipping off trailer this am, pain with any movedment and complains fingers feel numb, positive distal pulses, no loc

## 2015-11-14 NOTE — ED Provider Notes (Signed)
CSN: [id number]     Arrival date & time 11/14/15  1231 History   First MD Initiated Contact with Patient 11/14/15 1556     Chief Complaint  Patient presents with  . Shoulder Injury     (Consider location/radiation/quality/duration/timing/severity/associated sxs/prior Treatment) Patient is a 80 y.o. male presenting with shoulder injury. The history is provided by the patient.  Shoulder Injury Pertinent negatives include no chest pain, no abdominal pain, no headaches and no shortness of breath.  Patient c/o fall/near fall with left shoulder injury earlier today.  Patient states he stumbled, fell against/onto a trailer - tried to catch self with outstretched left arm/shoulder. Noted sudden onset left shoulder pain, sharp. States felt as if shoulder slid out and back in. Pain moderate, constant, dull, improved from prior. No radicular pain. Had tingling sensation tl left hand initially - improved currently.  States prior to fall, felt fine, at baseline. Did not feel sick or ill today. No fever or chills. No faintness or dizziness. Denies headache. No neck or back pain. No chest pain or discomfort. No sob. No nv. Denies any other pain or injury.      Past Medical History  Diagnosis Date  . ED (erectile dysfunction)   . Dyslipidemia   . Hypertension   . Arthritis    Past Surgical History  Procedure Laterality Date  . Left total knee  2011  . Colonoscopy  2007    Dr.kaplan   Family History  Problem Relation Age of Onset  . Heart failure Mother   . Heart failure Father   . Hypertension Mother   . Hypertension Father   . Renal Disease Sister   . Diabetes Brother    Social History  Substance Use Topics  . Smoking status: Former Research scientist (life sciences)  . Smokeless tobacco: Never Used  . Alcohol Use: 0.0 oz/week    0 Standard drinks or equivalent per week     Comment: rare    Review of Systems  Constitutional: Negative for fever.  HENT: Negative for nosebleeds.   Eyes: Negative for visual  disturbance.  Respiratory: Negative for cough and shortness of breath.   Cardiovascular: Negative for chest pain and palpitations.  Gastrointestinal: Negative for vomiting, abdominal pain and diarrhea.  Genitourinary: Negative for flank pain.  Musculoskeletal: Negative for back pain and neck pain.  Skin: Negative for wound.  Neurological: Negative for syncope, speech difficulty, weakness and headaches.  Hematological: Does not bruise/bleed easily.  Psychiatric/Behavioral: Negative for confusion.      Allergies  Review of patient's allergies indicates no known allergies.  Home Medications   Prior to Admission medications   Medication Sig Start Date End Date Taking? Authorizing Provider  aspirin 81 MG tablet Take 81 mg by mouth daily.    Historical Provider, MD  donepezil (ARICEPT) 5 MG tablet TAKE 1 TABLET(5 MG) BY MOUTH AT BEDTIME Patient not taking: Reported on 11/12/2015 07/27/15   Pieter Partridge, DO  levofloxacin (LEVAQUIN) 500 MG tablet Take 1 tablet (500 mg total) by mouth daily. 11/12/15   Denita Lung, MD  lisinopril-hydrochlorothiazide (PRINZIDE,ZESTORETIC) 10-12.5 MG tablet Take 1 tablet by mouth daily. 05/28/15   Denita Lung, MD  meloxicam (MOBIC) 15 MG tablet TAKE 1 TABLET(15 MG) BY MOUTH DAILY 11/09/15   Denita Lung, MD  methocarbamol (ROBAXIN) 500 MG tablet Take 500 mg by mouth every 12 (twelve) hours as needed for muscle spasms.    Historical Provider, MD  simvastatin (ZOCOR) 20 MG tablet TAKE 1 TABLET  BY MOUTH DAILY 08/17/15   Denita Lung, MD  sulfamethoxazole-trimethoprim (BACTRIM DS,SEPTRA DS) 800-160 MG tablet Take 1 tablet by mouth 2 (two) times daily. Patient not taking: Reported on 11/12/2015 10/19/15   Denita Lung, MD  tadalafil (CIALIS) 20 MG tablet Take 1 tablet (20 mg total) by mouth every other day as needed for erectile dysfunction. Patient not taking: Reported on 10/19/2015 05/25/12   Denita Lung, MD  terazosin (HYTRIN) 1 MG capsule Take 1 capsule (1 mg  total) by mouth at bedtime. Patient not taking: Reported on 10/19/2015 06/25/15   Denita Lung, MD   BP 137/72 mmHg  Pulse 91  Temp(Src) 97.6 F (36.4 C) (Oral)  Resp 18  Ht 5\' 5"  (1.651 m)  Wt 82.555 kg  BMI 30.29 kg/m2  SpO2 96% Physical Exam  Constitutional: He is oriented to person, place, and time. He appears well-developed and well-nourished. No distress.  HENT:  Head: Atraumatic.  Mouth/Throat: Oropharynx is clear and moist.  Eyes: Conjunctivae are normal. Pupils are equal, round, and reactive to light. No scleral icterus.  Neck: Neck supple. No tracheal deviation present.  No bruits  Cardiovascular: Normal rate, regular rhythm, normal heart sounds and intact distal pulses.   Pulmonary/Chest: Effort normal and breath sounds normal. No accessory muscle usage. No respiratory distress. He exhibits no tenderness.  Abdominal: Soft. Bowel sounds are normal. He exhibits no distension. There is no tenderness.  Genitourinary:  No cva tenderness  Musculoskeletal: Normal range of motion.  Mild tenderness left shoulder. No signif sts noted. Radial pulse 2+. Good passive rom LUE without pain.   Neurological: He is alert and oriented to person, place, and time.  LUE Rad/Med/Uln nerve fxn - motor intact, sens to pressure, touch, and pinprick intact.   Skin: Skin is warm and dry. He is not diaphoretic.  Psychiatric: He has a normal mood and affect.  Nursing note and vitals reviewed.   ED Course  Procedures (including critical care time)  Imaging Review Dg Shoulder Left  11/14/2015  CLINICAL DATA:  Pain following fall EXAM: LEFT SHOULDER - 2+ VIEW COMPARISON:  None. FINDINGS: Internal rotation, external rotation, and Y scapular images were obtained. There is no demonstrable fracture or dislocation. There is moderate generalized osteoarthritic change. No erosive change. Visualized left lung is clear. IMPRESSION: Moderate generalized osteoarthritic change. No fracture or dislocation  evident. Electronically Signed   By: Lowella Grip III M.D.   On: 11/14/2015 13:38   I have personally reviewed and evaluated these images as part of my medical decision-making.    MDM   Xrays.  Reviewed nursing notes and prior charts for additional history.   No meds pta.  Ice to sore area. Shoulder sling for comfort/support.  Tylenol po.  Discussed possibility soft tissue injury, labrum tear, rotator cuff injury, etc, and need for ortho follow up.   Patient indicates has been followed at [facility] in past - will give referral.  Patient states otherwise feels well, ready for d/c. Did not feel sick, ill, feverish or faint today.  Ambulate in hall.  Pt appears stable for d/c.       Lajean Saver, MD 11/14/15 [phone]

## 2015-11-24 DIAGNOSIS — M79642 Pain in left hand: Secondary | ICD-10-CM | POA: Diagnosis not present

## 2015-11-26 ENCOUNTER — Ambulatory Visit: Payer: Medicare Other | Admitting: Family Medicine

## 2015-11-26 ENCOUNTER — Telehealth: Payer: Self-pay | Admitting: Neurology

## 2015-11-26 NOTE — Telephone Encounter (Signed)
Daughter called wanting an appt. Sooner than July for her dad.  He fell on May 6th and hurt his shoulder. He went to the ED and they wanted him to see an Ortho doc.  This appt. Was on May 16th.  Ortho wants Dr. [name] to see him before they proceed with any other treatment.  The pat has numbness in his fingers and Off balance more than usual.   Please call the daughter [name] [phone]- cell and [phone] work.

## 2015-11-26 NOTE — Telephone Encounter (Signed)
Provider had openings. Pt scheduled for Monday 11/30/15 at 10:15. [name] aware.

## 2015-11-30 ENCOUNTER — Encounter: Payer: Self-pay | Admitting: Neurology

## 2015-11-30 ENCOUNTER — Ambulatory Visit (INDEPENDENT_AMBULATORY_CARE_PROVIDER_SITE_OTHER): Payer: Medicare Other | Admitting: Neurology

## 2015-11-30 VITALS — BP 116/66 | HR 70 | Ht 65.0 in

## 2015-11-30 DIAGNOSIS — M6289 Other specified disorders of muscle: Secondary | ICD-10-CM

## 2015-11-30 DIAGNOSIS — R2681 Unsteadiness on feet: Secondary | ICD-10-CM

## 2015-11-30 DIAGNOSIS — G231 Progressive supranuclear ophthalmoplegia [Steele-Richardson-Olszewski]: Secondary | ICD-10-CM

## 2015-11-30 DIAGNOSIS — R29898 Other symptoms and signs involving the musculoskeletal system: Secondary | ICD-10-CM

## 2015-11-30 DIAGNOSIS — G609 Hereditary and idiopathic neuropathy, unspecified: Secondary | ICD-10-CM | POA: Diagnosis not present

## 2015-11-30 NOTE — Progress Notes (Signed)
NEUROLOGY FOLLOW UP OFFICE NOTE  Brian Montgomery [id number]  HISTORY OF PRESENT ILLNESS: Brian Montgomery is a 80 year old right-handed man with hypertension, dyslipidemia, and arthritis who follows up for gait instability.  He is accompanied by his daughters and wife who provides some history.  UPDATE: MRI of cervical spine from 09/01/15 showed multilevel spondylosis and mild spinal stenosis without cord deformity or signal abnormality.  He was referred again to PT.  He continues to fall and had recent fall on 11/14/15, which required an ED visit.  He fell on his left side/shoulder.  He had dislocated his shoulder but was able to pop it back in.  X-ray of left shoulder in the ED revealed osteoarthritic changes but no fracture or dislocation.  Since the fall, he has had numbness and weakness of his left hand.  He notes pain in the shoulder but denies neck pain.  MoCA score was 10.  He was started on Aricept.  HISTORY: For about a year, he has had increased problems with balance.  When he gets up, he has to take a moment to maintain his balance.  He sometimes feels has to push off.  When he is walking, he feels like his body is being pulled to either side.  There is no spinning sensation, lightheadedness, nausea, vomiting, slurred speech, visual disturbance or numbness or weakness.  He denies neck pain, low back pain or pain in the legs when he walks or stands.  He has longstanding problems with hearing but no tinnitus.  Orthostatics were checked, which were negative.    His daughters do not notice any significant memory problems.  His wife pays the bills.  He is able to perform all of his ADLs.  He does have some urinary frequency but has BPH.  He does have symptoms suggestive of REM sleep behavior disorder (he will scream and thrash around in his sleep, one time falling out of bed).  He denies tremor.  MoCA score from 07/27/15 was 10.  MRI of the brain from 10/27/14 showed mild to moderate chronic small  vessel disease and mild global atrophy.    Labs, including TSH, glucose, ANA, Sed Rate, B12, RPR, SPEP/IFE were normal.    He does have history of low back surgery and left knee replacement.  PAST MEDICAL HISTORY: Past Medical History  Diagnosis Date  . ED (erectile dysfunction)   . Dyslipidemia   . Hypertension   . Arthritis     MEDICATIONS: Current Outpatient Prescriptions on File Prior to Visit  Medication Sig Dispense Refill  . aspirin 81 MG tablet Take 81 mg by mouth daily.    Marland Kitchen lisinopril-hydrochlorothiazide (PRINZIDE,ZESTORETIC) 10-12.5 MG tablet Take 1 tablet by mouth daily. 90 tablet 3  . meloxicam (MOBIC) 15 MG tablet TAKE 1 TABLET(15 MG) BY MOUTH DAILY 30 tablet 0  . naproxen sodium (ANAPROX) 220 MG tablet Take 220 mg by mouth 2 (two) times daily as needed (FOR PAIN).    . Omega-3 Fatty Acids (FISH OIL) 1000 MG CAPS Take 1,000 mg by mouth daily.    . simvastatin (ZOCOR) 20 MG tablet TAKE 1 TABLET BY MOUTH DAILY (Patient taking differently: TAKE 1 TABLET BY MOUTH DAILY IN MORNING) 90 tablet 1   No current facility-administered medications on file prior to visit.    ALLERGIES: No Known Allergies  FAMILY HISTORY: Family History  Problem Relation Age of Onset  . Heart failure Mother   . Heart failure Father   . Hypertension Mother   .  Hypertension Father   . Renal Disease Sister   . Diabetes Brother     SOCIAL HISTORY: Social History   Social History  . Marital Status: Married    Spouse Name: N/A  . Number of Children: N/A  . Years of Education: N/A   Occupational History  . Not on file.   Social History Main Topics  . Smoking status: Former Research scientist (life sciences)  . Smokeless tobacco: Never Used  . Alcohol Use: 0.0 oz/week    0 Standard drinks or equivalent per week     Comment: rare  . Drug Use: No  . Sexual Activity:    Partners: Female   Other Topics Concern  . Not on file   Social History Narrative    REVIEW OF SYSTEMS: Constitutional: No fevers,  chills, or sweats, no generalized fatigue, change in appetite Eyes: No visual changes, double vision, eye pain Ear, nose and throat: No hearing loss, ear pain, nasal congestion, sore throat Cardiovascular: No chest pain, palpitations Respiratory:  No shortness of breath at rest or with exertion, wheezes GastrointestinaI: No nausea, vomiting, diarrhea, abdominal pain, fecal incontinence Genitourinary:  No dysuria, urinary retention or frequency Musculoskeletal:  Left shoulder pain Integumentary: No rash, pruritus, skin lesions Neurological: as above Psychiatric: No depression, insomnia, anxiety Endocrine: No palpitations, fatigue, diaphoresis, mood swings, change in appetite, change in weight, increased thirst Hematologic/Lymphatic:  No purpura, petechiae. Allergic/Immunologic: no itchy/runny eyes, nasal congestion, recent allergic reactions, rashes  PHYSICAL EXAM: Filed Vitals:   11/30/15 1007  BP: 116/66  Pulse: 70   General: No acute distress.  Patient appears well-groomed.  normal body habitus. Head:  Normocephalic/atraumatic Eyes:  Fundi examined but not visualized Neck: supple, no paraspinal tenderness, full range of motion Heart:  Regular rate and rhythm Lungs:  Clear to auscultation bilaterally Back: No paraspinal tenderness Neurological Exam: alert and oriented to person, place, and time. Attention span and concentration intact, recent and remote memory intact, fund of knowledge intact.  Speech fluent and not dysarthric, language intact.   Decreased upward and downward gaze on tracking.  Otherwise, CN II-XII intact. Bulk and tone normal, 3+ left deltoid, interossei, APB, and grip.  Otherwise, muscle strength 5/5 throughout.  Decreased finger-thumb tapping speed and amplitude but otherwise, no obvious signs of bradykinesia.  No cogwheel rigidity or tremor.  Sensation to pinprick reduced in the left hand and forearm.  Pinprick sensation in feet intact but vibration sensation  depressed in feet.  Deep tendon reflexes 2+ throughout.  Finger to nose testing intact.  Gait with occasional reduced stride but also staggers.  Requires several steps to turn around.  Cannot tandem walk.  Romberg positive.  IMPRESSION: Gait instability with frequent falls.  He does have an idiopathic peripheral neuropathy, which may be contributing.  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  Left hand weakness and numbness.  History suggests peripheral etiology from the injury.    PLAN: 1.  We will first check NCV-EMG of the left upper extremity to evaluate for cervical radiculopathy/plexopathy, as well as left lower extremity to assess for severity of polyneuropathy.  Further testing pending results (whether we need to repeat MRI of either cervical or brain) 2.  Aricept 3.  Home Health assessment to evaluate safety as well as PT/OT 4.  Follow up in 3 months.  27 minutes spent face to face with patient, over 50% spent discussing diagnoses and management.  Metta Clines, DO  CC:  Jill Alexanders,  MD

## 2015-11-30 NOTE — Patient Instructions (Signed)
1.  We will check a nerve study of the left upper and lower extremities (should be scheduled in 2 weeks) 2.  We will get a home health assessment, including physical and occupational therapy 3.  Follow up in 3 months.

## 2015-12-02 ENCOUNTER — Ambulatory Visit (INDEPENDENT_AMBULATORY_CARE_PROVIDER_SITE_OTHER): Payer: Medicare Other | Admitting: Family Medicine

## 2015-12-02 ENCOUNTER — Telehealth: Payer: Self-pay | Admitting: Neurology

## 2015-12-02 ENCOUNTER — Encounter: Payer: Self-pay | Admitting: Family Medicine

## 2015-12-02 VITALS — BP 120/70 | HR 73 | Wt 179.0 lb

## 2015-12-02 DIAGNOSIS — R319 Hematuria, unspecified: Secondary | ICD-10-CM

## 2015-12-02 LAB — POCT URINALYSIS DIPSTICK
BILIRUBIN UA: NEGATIVE
Glucose, UA: NEGATIVE
KETONES UA: NEGATIVE
Nitrite, UA: NEGATIVE
PH UA: 6
Protein, UA: POSITIVE
RBC UA: POSITIVE
Urobilinogen, UA: NEGATIVE

## 2015-12-02 NOTE — Progress Notes (Signed)
   Subjective:    Patient ID: Brian Montgomery, male    DOB: 10/25/1935, 80 y.o.   MRN: [id number]  HPI  he is here for a recheck. He now has been on 2 separate antibiotics. He has no urgency, frequency, fever, chills or abdominal pain. He is in the process of being worked up neurologically for gait instability as well as some potential left arm weakness.  he asked for pain medication.  Review of Systems     Objective:   Physical Exam  alert and in no distress otherwise not examined. Urine dipstick did show white cells and red cells.       Assessment & Plan:  Hematuria - Plan: POCT Urinalysis Dipstick, Ambulatory referral to Urology  this time to refer to urology for further evaluation.  He is also scheduled for physical therapy to help with the gait instability as well as upper arm weakness.  explained that is difficult to no which pain medicine to give him without having further studies done. Encouraged him to get the nerve conduction studies done.

## 2015-12-02 NOTE — Telephone Encounter (Signed)
Brian Montgomery [birth year]. He has a  Doctor appointment on Friday and his wife would like to sit in on that appointment with Dr. Redmond School?  Clabe Seal from [facility] called to make sure it would be ok. Her number is [phone]. Thank you

## 2015-12-03 NOTE — Telephone Encounter (Signed)
Left message with answering service,  For Brian Montgomery at [facility]

## 2015-12-03 NOTE — Telephone Encounter (Signed)
Spoke with Suanne Marker. They wanted to move start of care date from today to tomorrow, because his wife wanted to be there. Verbal okay given.

## 2015-12-04 DIAGNOSIS — G231 Progressive supranuclear ophthalmoplegia [Steele-Richardson-Olszewski]: Secondary | ICD-10-CM | POA: Diagnosis not present

## 2015-12-04 DIAGNOSIS — R2681 Unsteadiness on feet: Secondary | ICD-10-CM | POA: Diagnosis not present

## 2015-12-08 DIAGNOSIS — R2681 Unsteadiness on feet: Secondary | ICD-10-CM | POA: Diagnosis not present

## 2015-12-08 DIAGNOSIS — G231 Progressive supranuclear ophthalmoplegia [Steele-Richardson-Olszewski]: Secondary | ICD-10-CM | POA: Diagnosis not present

## 2015-12-09 DIAGNOSIS — R310 Gross hematuria: Secondary | ICD-10-CM | POA: Diagnosis not present

## 2015-12-09 DIAGNOSIS — R2681 Unsteadiness on feet: Secondary | ICD-10-CM | POA: Diagnosis not present

## 2015-12-09 DIAGNOSIS — Z0000 Encounter for general adult medical examination without abnormal findings: Secondary | ICD-10-CM | POA: Diagnosis not present

## 2015-12-09 DIAGNOSIS — G231 Progressive supranuclear ophthalmoplegia [Steele-Richardson-Olszewski]: Secondary | ICD-10-CM | POA: Diagnosis not present

## 2015-12-09 DIAGNOSIS — N390 Urinary tract infection, site not specified: Secondary | ICD-10-CM | POA: Diagnosis not present

## 2015-12-09 DIAGNOSIS — N402 Nodular prostate without lower urinary tract symptoms: Secondary | ICD-10-CM | POA: Diagnosis not present

## 2015-12-10 ENCOUNTER — Telehealth: Payer: Self-pay

## 2015-12-10 DIAGNOSIS — G231 Progressive supranuclear ophthalmoplegia [Steele-Richardson-Olszewski]: Secondary | ICD-10-CM | POA: Diagnosis not present

## 2015-12-10 DIAGNOSIS — R2681 Unsteadiness on feet: Secondary | ICD-10-CM | POA: Diagnosis not present

## 2015-12-10 NOTE — Telephone Encounter (Signed)
error 

## 2015-12-11 ENCOUNTER — Other Ambulatory Visit: Payer: Self-pay | Admitting: Family Medicine

## 2015-12-11 DIAGNOSIS — R2681 Unsteadiness on feet: Secondary | ICD-10-CM | POA: Diagnosis not present

## 2015-12-11 DIAGNOSIS — G231 Progressive supranuclear ophthalmoplegia [Steele-Richardson-Olszewski]: Secondary | ICD-10-CM | POA: Diagnosis not present

## 2015-12-11 NOTE — Telephone Encounter (Signed)
Dr.Lalonde is this okay to refill 

## 2015-12-15 DIAGNOSIS — R2681 Unsteadiness on feet: Secondary | ICD-10-CM | POA: Diagnosis not present

## 2015-12-15 DIAGNOSIS — G231 Progressive supranuclear ophthalmoplegia [Steele-Richardson-Olszewski]: Secondary | ICD-10-CM | POA: Diagnosis not present

## 2015-12-16 DIAGNOSIS — R2681 Unsteadiness on feet: Secondary | ICD-10-CM | POA: Diagnosis not present

## 2015-12-16 DIAGNOSIS — G231 Progressive supranuclear ophthalmoplegia [Steele-Richardson-Olszewski]: Secondary | ICD-10-CM | POA: Diagnosis not present

## 2015-12-18 DIAGNOSIS — G231 Progressive supranuclear ophthalmoplegia [Steele-Richardson-Olszewski]: Secondary | ICD-10-CM | POA: Diagnosis not present

## 2015-12-18 DIAGNOSIS — R2681 Unsteadiness on feet: Secondary | ICD-10-CM | POA: Diagnosis not present

## 2015-12-22 DIAGNOSIS — R2681 Unsteadiness on feet: Secondary | ICD-10-CM | POA: Diagnosis not present

## 2015-12-22 DIAGNOSIS — G231 Progressive supranuclear ophthalmoplegia [Steele-Richardson-Olszewski]: Secondary | ICD-10-CM | POA: Diagnosis not present

## 2015-12-23 DIAGNOSIS — G231 Progressive supranuclear ophthalmoplegia [Steele-Richardson-Olszewski]: Secondary | ICD-10-CM | POA: Diagnosis not present

## 2015-12-23 DIAGNOSIS — R2681 Unsteadiness on feet: Secondary | ICD-10-CM | POA: Diagnosis not present

## 2015-12-24 DIAGNOSIS — G231 Progressive supranuclear ophthalmoplegia [Steele-Richardson-Olszewski]: Secondary | ICD-10-CM | POA: Diagnosis not present

## 2015-12-24 DIAGNOSIS — R2681 Unsteadiness on feet: Secondary | ICD-10-CM | POA: Diagnosis not present

## 2015-12-28 DIAGNOSIS — R310 Gross hematuria: Secondary | ICD-10-CM | POA: Diagnosis not present

## 2015-12-28 DIAGNOSIS — N390 Urinary tract infection, site not specified: Secondary | ICD-10-CM | POA: Diagnosis not present

## 2015-12-29 DIAGNOSIS — R2681 Unsteadiness on feet: Secondary | ICD-10-CM | POA: Diagnosis not present

## 2015-12-29 DIAGNOSIS — G231 Progressive supranuclear ophthalmoplegia [Steele-Richardson-Olszewski]: Secondary | ICD-10-CM | POA: Diagnosis not present

## 2015-12-30 ENCOUNTER — Telehealth: Payer: Self-pay | Admitting: Neurology

## 2015-12-30 NOTE — Telephone Encounter (Signed)
Brian Montgomery 09-08-2035. Mya an OT with [facility] called and needed new orders to continue with OT for this patient. Her number is [phone]. Thank  you

## 2015-12-30 NOTE — Telephone Encounter (Signed)
Verbal orders given to continue OT.

## 2015-12-31 ENCOUNTER — Encounter: Payer: Medicare Other | Admitting: Psychology

## 2015-12-31 DIAGNOSIS — G231 Progressive supranuclear ophthalmoplegia [Steele-Richardson-Olszewski]: Secondary | ICD-10-CM | POA: Diagnosis not present

## 2015-12-31 DIAGNOSIS — R2681 Unsteadiness on feet: Secondary | ICD-10-CM | POA: Diagnosis not present

## 2016-01-08 DIAGNOSIS — G231 Progressive supranuclear ophthalmoplegia [Steele-Richardson-Olszewski]: Secondary | ICD-10-CM | POA: Diagnosis not present

## 2016-01-08 DIAGNOSIS — R2681 Unsteadiness on feet: Secondary | ICD-10-CM | POA: Diagnosis not present

## 2016-01-11 ENCOUNTER — Other Ambulatory Visit: Payer: Self-pay | Admitting: Family Medicine

## 2016-01-13 DIAGNOSIS — G231 Progressive supranuclear ophthalmoplegia [Steele-Richardson-Olszewski]: Secondary | ICD-10-CM | POA: Diagnosis not present

## 2016-01-13 DIAGNOSIS — R2681 Unsteadiness on feet: Secondary | ICD-10-CM | POA: Diagnosis not present

## 2016-01-13 NOTE — Telephone Encounter (Signed)
Is this okay to refill? 

## 2016-01-14 ENCOUNTER — Encounter: Payer: Medicare Other | Admitting: Psychology

## 2016-01-15 DIAGNOSIS — G231 Progressive supranuclear ophthalmoplegia [Steele-Richardson-Olszewski]: Secondary | ICD-10-CM | POA: Diagnosis not present

## 2016-01-15 DIAGNOSIS — R2681 Unsteadiness on feet: Secondary | ICD-10-CM | POA: Diagnosis not present

## 2016-01-17 ENCOUNTER — Emergency Department (HOSPITAL_COMMUNITY)
Admission: EM | Admit: 2016-01-17 | Discharge: 2016-01-17 | Disposition: A | Discharge status: Home / Self Care | Payer: Medicare Other | Attending: Emergency Medicine | Admitting: Emergency Medicine

## 2016-01-17 ENCOUNTER — Encounter (HOSPITAL_COMMUNITY): Payer: Self-pay | Admitting: Emergency Medicine

## 2016-01-17 DIAGNOSIS — Z87891 Personal history of nicotine dependence: Secondary | ICD-10-CM | POA: Insufficient documentation

## 2016-01-17 DIAGNOSIS — N289 Disorder of kidney and ureter, unspecified: Secondary | ICD-10-CM | POA: Diagnosis not present

## 2016-01-17 DIAGNOSIS — I10 Essential (primary) hypertension: Secondary | ICD-10-CM | POA: Diagnosis not present

## 2016-01-17 DIAGNOSIS — Z7982 Long term (current) use of aspirin: Secondary | ICD-10-CM | POA: Diagnosis not present

## 2016-01-17 DIAGNOSIS — R319 Hematuria, unspecified: Secondary | ICD-10-CM

## 2016-01-17 LAB — CBC WITH DIFFERENTIAL/PLATELET
BASOS PCT: 0 %
Basophils Absolute: 0 10*3/uL (ref 0.0–0.1)
EOS ABS: 0.1 10*3/uL (ref 0.0–0.7)
Eosinophils Relative: 2 %
HEMATOCRIT: 39.3 % (ref 39.0–52.0)
HEMOGLOBIN: 13 g/dL (ref 13.0–17.0)
Lymphocytes Relative: 24 %
Lymphs Abs: 2.1 10*3/uL (ref 0.7–4.0)
MCH: 29.8 pg (ref 26.0–34.0)
MCHC: 33.1 g/dL (ref 30.0–36.0)
MCV: 90.1 fL (ref 78.0–100.0)
MONOS PCT: 6 %
Monocytes Absolute: 0.5 10*3/uL (ref 0.1–1.0)
NEUTROS PCT: 68 %
Neutro Abs: 6 10*3/uL (ref 1.7–7.7)
Platelets: 226 10*3/uL (ref 150–400)
RBC: 4.36 MIL/uL (ref 4.22–5.81)
RDW: 13.1 % (ref 11.5–15.5)
WBC: 8.7 10*3/uL (ref 4.0–10.5)

## 2016-01-17 LAB — URINALYSIS, ROUTINE W REFLEX MICROSCOPIC
Bilirubin Urine: NEGATIVE
GLUCOSE, UA: 100 mg/dL — AB
Ketones, ur: NEGATIVE mg/dL
LEUKOCYTES UA: NEGATIVE
Nitrite: NEGATIVE
PH: 7 (ref 5.0–8.0)
SPECIFIC GRAVITY, URINE: 1.029 (ref 1.005–1.030)

## 2016-01-17 LAB — URINE MICROSCOPIC-ADD ON: Squamous Epithelial / LPF: NONE SEEN

## 2016-01-17 LAB — I-STAT CHEM 8, ED
BUN: 30 mg/dL — AB (ref 6–20)
CALCIUM ION: 1.21 mmol/L (ref 1.12–1.23)
CREATININE: 1.5 mg/dL — AB (ref 0.61–1.24)
Chloride: 107 mmol/L (ref 101–111)
GLUCOSE: 90 mg/dL (ref 65–99)
HCT: 41 % (ref 39.0–52.0)
Hemoglobin: 13.9 g/dL (ref 13.0–17.0)
Potassium: 4 mmol/L (ref 3.5–5.1)
SODIUM: 143 mmol/L (ref 135–145)
TCO2: 24 mmol/L (ref 0–100)

## 2016-01-17 NOTE — Discharge Instructions (Signed)
Foley Catheter Care, Adult Dr.Borden's office will call you with a time to appear at the office this week. If you don't hear from the office by tomorrow afternoon, call to schedule the appointment and say that you need to be seen this week. Tell office staff that [place] spoke with Dr.Borden when scheduling the appointment. Return to [facility] long emergency department if you feel that the catheter is not draining properly orders clogged or if you become lightheaded or faint or are concerned for any reason. A Foley catheter is a soft, flexible tube. This tube is placed into your bladder to drain pee (urine). If you go home with this catheter in place, follow the instructions below. TAKING CARE OF THE CATHETER 1. Wash your hands with soap and water. 2. Put soap and water on a clean washcloth.  Clean the skin where the tube goes into your body.  Clean away from the tube site.  Never wipe toward the tube.  Clean the area using a circular motion.  Remove all the soap. Pat the area dry with a clean towel. For males, reposition the skin that covers the end of the penis (foreskin). 3. Attach the tube to your leg with tape or a leg strap. Do not stretch the tube tight. If you are using tape, remove any stickiness left behind by past tape you used. 4. Keep the drainage bag below your hips. Keep it off the floor. 5. Check your tube during the day. Make sure it is working and draining. Make sure the tube does not curl, twist, or bend. 6. Do not pull on the tube or try to take it out. TAKING CARE OF THE DRAINAGE BAGS You will have a large overnight drainage bag and a small leg bag. You may wear the overnight bag any time. Never wear the small bag at night. Follow the directions below. Emptying the Drainage Bag Empty your drainage bag when it is  - full or at least 2-3 times a day. 1. Wash your hands with soap and water. 2. Keep the drainage bag below your hips. 3. Hold the dirty bag over the toilet  or clean container. 4. Open the pour spout at the bottom of the bag. Empty the pee into the toilet or container. Do not let the pour spout touch anything. 5. Clean the pour spout with a gauze pad or cotton ball that has rubbing alcohol on it. 6. Close the pour spout. 7. Attach the bag to your leg with tape or a leg strap. 8. Wash your hands well. Changing the Drainage Bag Change your bag once a month or sooner if it starts to smell or look dirty.  1. Wash your hands with soap and water. 2. Pinch the rubber tube so that pee does not spill out. 3. Disconnect the catheter tube from the drainage tube at the connection valve. Do not let the tubes touch anything. 4. Clean the end of the catheter tube with an alcohol wipe. Clean the end of a the drainage tube with a different alcohol wipe. 5. Connect the catheter tube to the drainage tube of the clean drainage bag. 6. Attach the new bag to the leg with tape or a leg strap. Avoid attaching the new bag too tightly. 7. Wash your hands well. Cleaning the Drainage Bag 1. Wash your hands with soap and water. 2. Wash the bag in warm, soapy water. 3. Rinse the bag with warm water. 4. Fill the bag with a mixture of white  vinegar and water (1 cup vinegar to 1 quart warm water [.2 liter vinegar to 1 liter warm water]). Close the bag and soak it for 30 minutes in the solution. 5. Rinse the bag with warm water. 6. Hang the bag to dry with the pour spout open and hanging downward. 7. Store the clean bag (once it is dry) in a clean plastic bag. 8. Wash your hands well. PREVENT INFECTION  Wash your hands before and after touching your tube.  Take showers every day. Wash the skin where the tube enters your body. Do not take baths. Replace wet leg straps with dry ones, if this applies.  Do not use powders, sprays, or lotions on the genital area. Only use creams, lotions, or ointments as told by your doctor.  For females, wipe from front to back after going to  the bathroom.  Drink enough fluids to keep your pee clear or pale yellow unless you are told not to have too much fluid (fluid restriction).  Do not let the drainage bag or tubing touch or lie on the floor.  Wear cotton underwear to keep the area dry. GET HELP IF:  Your pee is cloudy or smells unusually bad.  Your tube becomes clogged.  You are not draining pee into the bag or your bladder feels full.  Your tube starts to leak. GET HELP RIGHT AWAY IF:  You have pain, puffiness (swelling), redness, or yellowish-white fluid (pus) where the tube enters the body.  You have pain in the belly (abdomen), legs, lower back, or bladder.  You have a fever.  You see blood fill the tube, or your pee is pink or red.  You feel sick to your stomach (nauseous), throw up (vomit), or have chills.  Your tube gets pulled out. MAKE SURE YOU:   Understand these instructions.  Will watch your condition.  Will get help right away if you are not doing well or get worse.   This information is not intended to replace advice given to you by your health care provider. Make sure you discuss any questions you have with your health care provider.   Document Released: 10/22/2012 Document Revised: 07/18/2014 Document Reviewed: 10/22/2012 Elsevier Interactive Patient Education [employer].

## 2016-01-17 NOTE — ED Notes (Addendum)
Pt c/o bleeding from penis ongoing for 2 months. Pt is being seen by a urologist for same. Pt reports symptoms increased today.

## 2016-01-17 NOTE — ED Provider Notes (Signed)
CSN: [id number]     Arrival date & time 01/17/16  1812 History   First MD Initiated Contact with Patient 01/17/16 2053     Chief Complaint  Patient presents with  . Hematuria     (Consider location/radiation/quality/duration/timing/severity/associated sxs/prior Treatment) HPI Complains of hematuria for the past 2 months. Over the past 2 days he's noticed more blood in his urine. And is urinating clots and has some difficulty with urination. And feeling of urgency. He denies lightheadedness denies pain anywhere. No other associated symptoms. No treatment prior to coming here. Past Medical History  Diagnosis Date  . ED (erectile dysfunction)   . Dyslipidemia   . Hypertension   . Arthritis    Past Surgical History  Procedure Laterality Date  . Left total knee  2011  . Colonoscopy  2007    Dr.kaplan   Family History  Problem Relation Age of Onset  . Heart failure Mother   . Heart failure Father   . Hypertension Mother   . Hypertension Father   . Renal Disease Sister   . Diabetes Brother    Social History  Substance Use Topics  . Smoking status: Former Research scientist (life sciences)  . Smokeless tobacco: Never Used  . Alcohol Use: 0.0 oz/week    0 Standard drinks or equivalent per week     Comment: rare    Review of Systems  Constitutional: Negative.   HENT: Negative.   Respiratory: Negative.   Cardiovascular: Negative.   Gastrointestinal: Negative.   Genitourinary: Positive for hematuria and difficulty urinating.  Musculoskeletal: Positive for gait problem.       Walks with walker  Skin: Negative.   Neurological: Negative.   Psychiatric/Behavioral: Negative.   All other systems reviewed and are negative.     Allergies  Review of patient's allergies indicates no known allergies.  Home Medications   Prior to Admission medications   Medication Sig Start Date End Date Taking? Authorizing Provider  aspirin 81 MG tablet Take 81 mg by mouth daily.   Yes Historical Provider, MD   donepezil (ARICEPT) 5 MG tablet Take 5 mg by mouth at bedtime.   Yes Historical Provider, MD  lisinopril-hydrochlorothiazide (PRINZIDE,ZESTORETIC) 10-12.5 MG tablet Take 1 tablet by mouth daily. 05/28/15  Yes Denita Lung, MD  meloxicam (MOBIC) 15 MG tablet TAKE 1 TABLET(15 MG) BY MOUTH DAILY 01/13/16  Yes Denita Lung, MD  naproxen sodium (ANAPROX) 220 MG tablet Take 220 mg by mouth 2 (two) times daily as needed (FOR PAIN). Reported on 12/02/2015   Yes Historical Provider, MD  Omega-3 Fatty Acids (FISH OIL) 1000 MG CAPS Take 1,000 mg by mouth daily.   Yes Historical Provider, MD  simvastatin (ZOCOR) 20 MG tablet TAKE 1 TABLET BY MOUTH DAILY Patient taking differently: TAKE 1 TABLET BY MOUTH DAILY IN MORNING 08/17/15  Yes Denita Lung, MD   BP 139/68 mmHg  Pulse 80  Temp(Src) 98.2 F (36.8 C) (Oral)  Resp 18  SpO2 99% Physical Exam  Constitutional: He appears well-developed and well-nourished.  HENT:  Head: Normocephalic and atraumatic.  Eyes: Conjunctivae are normal. Pupils are equal, round, and reactive to light.  Neck: Neck supple. No tracheal deviation present. No thyromegaly present.  Cardiovascular: Normal rate and regular rhythm.   No murmur heard. Pulmonary/Chest: Effort normal and breath sounds normal.  Abdominal: Soft. Bowel sounds are normal. He exhibits no distension. There is no tenderness.  Genitourinary: Penis normal.  Musculoskeletal: Normal range of motion. He exhibits no edema or tenderness.  Neurological: He is alert. No cranial nerve deficit. Coordination normal.  Not Lightheaded on standing  Skin: Skin is warm and dry. No rash noted.  Psychiatric: He has a normal mood and affect.  Nursing note and vitals reviewed.   ED Course  Procedures (including critical care time) Labs Review Labs Reviewed  URINALYSIS, ROUTINE W REFLEX MICROSCOPIC (NOT AT [facility]) - Abnormal; Notable for the following:    Color, Urine RED (*)    APPearance TURBID (*)    Glucose, UA 100  (*)    Hgb urine dipstick LARGE (*)    Protein, ur >300 (*)    All other components within normal limits  URINE MICROSCOPIC-ADD ON - Abnormal; Notable for the following:    Bacteria, UA RARE (*)    All other components within normal limits    Imaging Review No results found. I have personally reviewed and evaluated these images and lab results as part of my medical decision-making.   EKG Interpretation None     Results for orders placed or performed during the hospital encounter of 01/17/16  Urinalysis, Routine w reflex microscopic- may I&O cath if menses  Result Value Ref Range   Color, Urine RED (A) YELLOW   APPearance TURBID (A) CLEAR   Specific Gravity, Urine 1.029 1.005 - 1.030   pH 7.0 5.0 - 8.0   Glucose, UA 100 (A) NEGATIVE mg/dL   Hgb urine dipstick LARGE (A) NEGATIVE   Bilirubin Urine NEGATIVE NEGATIVE   Ketones, ur NEGATIVE NEGATIVE mg/dL   Protein, ur >300 (A) NEGATIVE mg/dL   Nitrite NEGATIVE NEGATIVE   Leukocytes, UA NEGATIVE NEGATIVE  Urine microscopic-add on  Result Value Ref Range   Squamous Epithelial / LPF NONE SEEN NONE SEEN   WBC, UA 6-30 0 - 5 WBC/hpf   RBC / HPF TOO NUMEROUS TO COUNT 0 - 5 RBC/hpf   Bacteria, UA RARE (A) NONE SEEN   Urine-Other MICROSCOPIC EXAM PERFORMED ON UNCONCENTRATED URINE   CBC with Differential/Platelet  Result Value Ref Range   WBC 8.7 4.0 - 10.5 K/uL   RBC 4.36 4.22 - 5.81 MIL/uL   Hemoglobin 13.0 13.0 - 17.0 g/dL   HCT 39.3 39.0 - 52.0 %   MCV 90.1 78.0 - 100.0 fL   MCH 29.8 26.0 - 34.0 pg   MCHC 33.1 30.0 - 36.0 g/dL   RDW 13.1 11.5 - 15.5 %   Platelets 226 150 - 400 K/uL   Neutrophils Relative % 68 %   Neutro Abs 6.0 1.7 - 7.7 K/uL   Lymphocytes Relative 24 %   Lymphs Abs 2.1 0.7 - 4.0 K/uL   Monocytes Relative 6 %   Monocytes Absolute 0.5 0.1 - 1.0 K/uL   Eosinophils Relative 2 %   Eosinophils Absolute 0.1 0.0 - 0.7 K/uL   Basophils Relative 0 %   Basophils Absolute 0.0 0.0 - 0.1 K/uL  I-stat chem 8, ed   Result Value Ref Range   Sodium 143 135 - 145 mmol/L   Potassium 4.0 3.5 - 5.1 mmol/L   Chloride 107 101 - 111 mmol/L   BUN 30 (H) 6 - 20 mg/dL   Creatinine, Ser 1.50 (H) 0.61 - 1.24 mg/dL   Glucose, Bld 90 65 - 99 mg/dL   Calcium, Ion 1.21 1.12 - 1.23 mmol/L   TCO2 24 0 - 100 mmol/L   Hemoglobin 13.9 13.0 - 17.0 g/dL   HCT 41.0 39.0 - 52.0 %   No results found. 10:10 PM patient resting  comfortably. Foley catheter inserted. Drank 250 mL of grossly bloody urine. Results for orders placed or performed during the hospital encounter of 01/17/16  Urinalysis, Routine w reflex microscopic- may I&O cath if menses  Result Value Ref Range   Color, Urine RED (A) YELLOW   APPearance TURBID (A) CLEAR   Specific Gravity, Urine 1.029 1.005 - 1.030   pH 7.0 5.0 - 8.0   Glucose, UA 100 (A) NEGATIVE mg/dL   Hgb urine dipstick LARGE (A) NEGATIVE   Bilirubin Urine NEGATIVE NEGATIVE   Ketones, ur NEGATIVE NEGATIVE mg/dL   Protein, ur >300 (A) NEGATIVE mg/dL   Nitrite NEGATIVE NEGATIVE   Leukocytes, UA NEGATIVE NEGATIVE  Urine microscopic-add on  Result Value Ref Range   Squamous Epithelial / LPF NONE SEEN NONE SEEN   WBC, UA 6-30 0 - 5 WBC/hpf   RBC / HPF TOO NUMEROUS TO COUNT 0 - 5 RBC/hpf   Bacteria, UA RARE (A) NONE SEEN   Urine-Other MICROSCOPIC EXAM PERFORMED ON UNCONCENTRATED URINE   CBC with Differential/Platelet  Result Value Ref Range   WBC 8.7 4.0 - 10.5 K/uL   RBC 4.36 4.22 - 5.81 MIL/uL   Hemoglobin 13.0 13.0 - 17.0 g/dL   HCT 39.3 39.0 - 52.0 %   MCV 90.1 78.0 - 100.0 fL   MCH 29.8 26.0 - 34.0 pg   MCHC 33.1 30.0 - 36.0 g/dL   RDW 13.1 11.5 - 15.5 %   Platelets 226 150 - 400 K/uL   Neutrophils Relative % 68 %   Neutro Abs 6.0 1.7 - 7.7 K/uL   Lymphocytes Relative 24 %   Lymphs Abs 2.1 0.7 - 4.0 K/uL   Monocytes Relative 6 %   Monocytes Absolute 0.5 0.1 - 1.0 K/uL   Eosinophils Relative 2 %   Eosinophils Absolute 0.1 0.0 - 0.7 K/uL   Basophils Relative 0 %   Basophils  Absolute 0.0 0.0 - 0.1 K/uL  I-stat chem 8, ed  Result Value Ref Range   Sodium 143 135 - 145 mmol/L   Potassium 4.0 3.5 - 5.1 mmol/L   Chloride 107 101 - 111 mmol/L   BUN 30 (H) 6 - 20 mg/dL   Creatinine, Ser 1.50 (H) 0.61 - 1.24 mg/dL   Glucose, Bld 90 65 - 99 mg/dL   Calcium, Ion 1.21 1.12 - 1.23 mmol/L   TCO2 24 0 - 100 mmol/L   Hemoglobin 13.9 13.0 - 17.0 g/dL   HCT 41.0 39.0 - 52.0 %   No results found.  MDM  Case discussed with Dr.Borden Plan urine for culture. He'll go home with Foley catheter with leg bag. [facility] office will call him to arrange to be seen in the office this week for further evaluation Diagnosis #1 hematuria #2 renal insufficiency Final diagnoses:  None        Orlie Dakin, MD 01/17/16 2217

## 2016-01-18 DIAGNOSIS — R310 Gross hematuria: Secondary | ICD-10-CM | POA: Diagnosis not present

## 2016-01-19 ENCOUNTER — Encounter: Payer: Medicare Other | Admitting: Neurology

## 2016-01-19 DIAGNOSIS — R310 Gross hematuria: Secondary | ICD-10-CM | POA: Diagnosis not present

## 2016-01-19 LAB — URINE CULTURE
CULTURE: NO GROWTH
Special Requests: NORMAL

## 2016-01-20 ENCOUNTER — Telehealth: Payer: Self-pay

## 2016-01-20 ENCOUNTER — Other Ambulatory Visit: Payer: Self-pay | Admitting: Urology

## 2016-01-20 ENCOUNTER — Encounter (HOSPITAL_COMMUNITY): Payer: Self-pay | Admitting: *Deleted

## 2016-01-20 NOTE — H&P (Signed)
Office Visit Report     01/19/2016   --------------------------------------------------------------------------------   Brian Montgomery  MRN: [id number]  PRIMARY CARE:  Denita Lung, MD  DOB: [birth year], 80 year old Male  REFERRING:  Denita Lung, MD  SSN: -[id number]  PROVIDER:  Raynelle Bring, M.D.    LOCATION:  [facility] [phone]   --------------------------------------------------------------------------------   CC/HPI: I have blood in my urine.     He follows up today after having presented to the emergency department on Sunday night with worsening gross hematuria. A 16 French catheter was placed. He was seen yesterday after he developed clot retention and this was irrigated. He returns today for further evaluation.    ALLERGIES: No Allergies    MEDICATIONS: Aleve 220 MG Oral Capsule Oral  Aspir-81 81 MG Oral Tablet Delayed Release Oral  Donepezil HCl - 5 MG Oral Tablet Oral  Fish Oil Concentrate 1000 MG Oral Capsule Oral  Lisinopril-Hydrochlorothiazide 10-12.5 MG Oral Tablet Oral  Meloxicam 15 MG Oral Tablet Oral  Simvastatin 20 MG Oral Tablet Oral     GU PSH: Catheterize For Residual - 01/18/2016      PSH Notes: Back Surgery   NON-GU PSH: None   GU PMH: Gross hematuria - 01/18/2016, Gross hematuria, Gross hematuria - 2015-12-30 Nodular prostate w/o LUTS, Prostate nodule - 12-30-2015 Urinary Tract Inf, Unspec site, Urinary tract infection - 12/30/2015      PMH Notes:   1) Prostate nodule: He presented to me in May 2017 with a right apical/mid prostate nodule with concern for extraprostatic extension.   2) Hematuria: He developed gross hematuria in May 2017. This was initially thought to be related to infection but persisted and ultimately he required catheterization.   Jul 2017: Cysto (no definite tumors seen), large formed clot in bladder  Jul 2017:   NON-GU PMH: Personal history of other diseases of the circulatory system, History of  hypertension - 12-30-2015 Personal history of other diseases of the musculoskeletal system and connective tissue, History of arthritis - Dec 30, 2015 Personal history of other diseases of the nervous system and sense organs, History of polyneuropathy - December 30, 2015 Personal history of other endocrine, nutritional and metabolic disease, History of hyperlipidemia - 2015-12-30    FAMILY HISTORY: Death of parent - Runs In Family malignant neoplasm of kidney - Runs In Family   SOCIAL HISTORY: Marital Status: Married     Notes: Married, Number of children, Retired, Former smoker, Alcohol use   REVIEW OF SYSTEMS:    GU Review Male:   Patient denies frequent urination, hard to postpone urination, burning/ pain with urination, get up at night to urinate, leakage of urine, stream starts and stops, trouble starting your streams, and have to strain to urinate .  Gastrointestinal (Upper):   Patient denies nausea and vomiting.  Gastrointestinal (Lower):   Patient denies diarrhea and constipation.  Constitutional:   Patient denies fever, night sweats, weight loss, and fatigue.  Skin:   Patient denies skin rash/ lesion and itching.  Eyes:   Patient denies blurred vision and double vision.  Ears/ Nose/ Throat:   Patient denies sore throat and sinus problems.  Hematologic/Lymphatic:   Patient denies swollen glands and easy bruising.  Cardiovascular:   Patient denies leg swelling and chest pains.  Respiratory:   Patient denies cough and shortness of breath.  Endocrine:   Patient denies excessive thirst.  Musculoskeletal:   Patient denies back pain and joint pain.  Neurological:   Patient reports dizziness. Patient denies  headaches.  Psychologic:   Patient denies depression and anxiety.   VITAL SIGNS: None   GU PHYSICAL EXAMINATION:    Urethral Meatus: Normal.   MULTI-SYSTEM PHYSICAL EXAMINATION:    Constitutional: Well-nourished. No physical deformities. Normally developed. Good grooming.  Respiratory: No  labored breathing, no use of accessory muscles.   Cardiovascular: Normal temperature, normal extremity pulses, no swelling, no varicosities.     PAST DATA REVIEWED:  Source Of History:  Patient  Records Review:   Previous Patient Records   PROCEDURES:         Flexible Cystoscopy - 52000  Indication:  Risks, benefits, and potential complications of the procedure were discussed with the patient including infection, bleeding, voiding discomfort, urinary retention, fever, chills, sepsis, and others. All questions were answered. Informed consent was obtained. Sterile technique and intraurethral analgesia were used.  Meatus:  Normal in appearance but slightly small to allow passage of a hematuria catheter. This required dilation is stated above.  Urethra:  No strictures.  External Sphincter:  Normal.  Verumontanum:  Normal.  Prostate:  He had bilateral enlargement of his lateral lobes with some friability to the prostate.  Bladder Neck:  Non-obstructing.  Ureteral Orifices:  Normal location. Normal size. Normal shape. Effluxed clear urine.  Bladder:  Inspection of the bladder was performed systematically. Immediately, a very large formed clot was noted within the bladder that appeared to be mobile. I could not visualize any obvious tumors within the bladder although visualization was suboptimal considering the large clot noted. The ureteral orifices appeared normal and effluxing clear urine.      Chaperone: The procedure was well-tolerated and without complications. Instructions were given to call the office immediately if questions or problems.         Meatal Dilation Sounds Initial - 53600  Risks, benefits, and some of the potential complications of the procedure were discussed at length with the patient including infection, bleeding, voiding discomfort, urinary retention, fever, chills, sepsis, and others. All questions were answered. Informed consent was obtained. Antibiotic prophylaxis was  given. Sterile technique and intraurethral analgesia were used.  Meatus:  Normal size. Normal location. Normal condition.    Leander Rams Sounds were used to dilate the meatus from 52 [place] to 28 [place].  The lower urinary tract was carefully examined. The procedure was well-tolerated and without complications. Antibiotic instructions were given. Instructions were given to call the office immediately for bloody urine, difficulty urinating, urinary retention, painful or frequent urination, fever, chills, nausea, vomiting or other illness. The patient stated that he understood these instructions and would comply with them.        Notes:   Following cystoscopy, I placed a 24 French hematuria catheter after dilation of his meatus. I then tried to vigorously irrigate his indwelling catheter. Unfortunately, the clot was too formed to be irrigated. This catheter was left in place and left to straight drainage.   ASSESSMENT:      ICD-10 Details  1 GU:   Nodular prostate w/o LUTS - N40.2   2   Gross hematuria - R31.0    PLAN:           Schedule Return Visit: ASAP - Schedule Surgery          Document Letter(s):  Created for Patient: Clinical Summary         Notes:   1. Gross hematuria: Because of his hematuria remains unclear although it would appear to be related to a prostatic source most likely. Considering  his very large formed clot, I have recommended that we proceed with cystoscopy under anesthesia with clot evacuation, and bilateral retrograde pyelography. We will consider proceeding with upper tract imaging in the future if necessary. However, his prostate would appear to be the likely source. I have reviewed the potential risks, complications, and expected recovery process associated with this planned procedure.   2. Prostate nodule: He unfortunately did not have his PSA drawn after his last visit. At this time, he has had multiple catheterizations and I do not think that drawn a PSA will be  very helpful. We will consider PSA prior to his prostate biopsy later this month although this may ultimately need to be repeated in the future. Regardless, does not change the indication for his prostate biopsy which is related to his very concerning digital rectal exam.   Cc: Dr. Jill Alexanders    * Signed by Raynelle Bring, M.D. on 01/19/16 at 6:33 PM (EDT)*

## 2016-01-20 NOTE — Telephone Encounter (Signed)
Mya, home health OT from [facility] called. Requesting to skip this weeks session and add missed session on to the end. Pt was seen by Urology yesterday and has a blood clot in his bladder, will be having surgery this week for issue. OT also asking if it's okay to restart OT next week or if you advise for her to wait longer. Please advise.   Pt was seen at [facility] urology. Notes note available at this time.

## 2016-01-20 NOTE — Telephone Encounter (Signed)
That is probably something that the urologist will need to answer

## 2016-01-21 ENCOUNTER — Ambulatory Visit (HOSPITAL_COMMUNITY): Payer: Medicare Other | Admitting: Registered Nurse

## 2016-01-21 ENCOUNTER — Encounter (HOSPITAL_COMMUNITY)
Admission: RE | Disposition: A | Discharge status: Home / Self Care | Payer: Self-pay | Source: Ambulatory Visit | Attending: Urology

## 2016-01-21 ENCOUNTER — Observation Stay (HOSPITAL_COMMUNITY)
Admission: RE | Admit: 2016-01-21 | Discharge: 2016-01-22 | Disposition: A | Discharge status: Home / Self Care | Payer: Medicare Other | Source: Ambulatory Visit | Attending: Urology | Admitting: Urology

## 2016-01-21 ENCOUNTER — Encounter (HOSPITAL_COMMUNITY): Payer: Self-pay | Admitting: Registered Nurse

## 2016-01-21 DIAGNOSIS — Z7982 Long term (current) use of aspirin: Secondary | ICD-10-CM | POA: Insufficient documentation

## 2016-01-21 DIAGNOSIS — C678 Malignant neoplasm of overlapping sites of bladder: Secondary | ICD-10-CM | POA: Diagnosis not present

## 2016-01-21 DIAGNOSIS — Z79899 Other long term (current) drug therapy: Secondary | ICD-10-CM | POA: Insufficient documentation

## 2016-01-21 DIAGNOSIS — Z87891 Personal history of nicotine dependence: Secondary | ICD-10-CM | POA: Insufficient documentation

## 2016-01-21 DIAGNOSIS — N403 Nodular prostate with lower urinary tract symptoms: Secondary | ICD-10-CM | POA: Diagnosis not present

## 2016-01-21 DIAGNOSIS — I10 Essential (primary) hypertension: Secondary | ICD-10-CM | POA: Insufficient documentation

## 2016-01-21 DIAGNOSIS — C679 Malignant neoplasm of bladder, unspecified: Secondary | ICD-10-CM | POA: Diagnosis not present

## 2016-01-21 DIAGNOSIS — C674 Malignant neoplasm of posterior wall of bladder: Secondary | ICD-10-CM | POA: Diagnosis not present

## 2016-01-21 DIAGNOSIS — G629 Polyneuropathy, unspecified: Secondary | ICD-10-CM | POA: Insufficient documentation

## 2016-01-21 DIAGNOSIS — Z8051 Family history of malignant neoplasm of kidney: Secondary | ICD-10-CM | POA: Insufficient documentation

## 2016-01-21 DIAGNOSIS — R319 Hematuria, unspecified: Secondary | ICD-10-CM | POA: Diagnosis not present

## 2016-01-21 DIAGNOSIS — E785 Hyperlipidemia, unspecified: Secondary | ICD-10-CM | POA: Insufficient documentation

## 2016-01-21 DIAGNOSIS — R310 Gross hematuria: Secondary | ICD-10-CM | POA: Diagnosis present

## 2016-01-21 HISTORY — PX: CYSTOSCOPY W/ RETROGRADES: SHX1426

## 2016-01-21 LAB — BASIC METABOLIC PANEL
Anion gap: 7 (ref 5–15)
Anion gap: 5 (ref 5–15)
BUN: 22 mg/dL — AB (ref 6–20)
BUN: 19 mg/dL (ref 6–20)
CALCIUM: 9.4 mg/dL (ref 8.9–10.3)
CALCIUM: 8.8 mg/dL — AB (ref 8.9–10.3)
CHLORIDE: 105 mmol/L (ref 101–111)
CO2: 25 mmol/L (ref 22–32)
CO2: 25 mmol/L (ref 22–32)
CREATININE: 1.28 mg/dL — AB (ref 0.61–1.24)
CREATININE: 1.19 mg/dL (ref 0.61–1.24)
Chloride: 104 mmol/L (ref 101–111)
GFR calc Af Amer: >60 mL/min (ref >60)
GFR calc non Af Amer: 57 mL/min — ABNORMAL LOW (ref >60)
GFR, EST NON AFRICAN AMERICAN: 52 mL/min — AB (ref >60)
GLUCOSE: 96 mg/dL (ref 65–99)
Glucose, Bld: 143 mg/dL — ABNORMAL HIGH (ref 65–99)
POTASSIUM: 4 mmol/L (ref 3.5–5.1)
Potassium: 4 mmol/L (ref 3.5–5.1)
SODIUM: 137 mmol/L (ref 135–145)
SODIUM: 134 mmol/L — AB (ref 135–145)

## 2016-01-21 LAB — HEMOGLOBIN AND HEMATOCRIT, BLOOD
HEMATOCRIT: 35.2 % — AB (ref 39.0–52.0)
HEMOGLOBIN: 12.1 g/dL — AB (ref 13.0–17.0)

## 2016-01-21 SURGERY — CYSTOSCOPY, WITH RETROGRADE PYELOGRAM
Anesthesia: General | Site: Bladder

## 2016-01-21 MED ORDER — DOCUSATE SODIUM 100 MG PO CAPS
100.0000 mg | ORAL_CAPSULE | Freq: Two times a day (BID) | ORAL | Status: DC
  Administered 2016-01-21 – 2016-01-22 (×2): 100 mg via ORAL
  Filled 2016-01-21 (×2): qty 1

## 2016-01-21 MED ORDER — SIMVASTATIN 20 MG PO TABS
20.0000 mg | ORAL_TABLET | Freq: Every day | ORAL | Status: DC
  Administered 2016-01-21 – 2016-01-22 (×2): 20 mg via ORAL
  Filled 2016-01-21 (×2): qty 1

## 2016-01-21 MED ORDER — DEXAMETHASONE SODIUM PHOSPHATE 10 MG/ML IJ SOLN
INTRAMUSCULAR | Status: DC | PRN
  Administered 2016-01-21: 10 mg via INTRAVENOUS

## 2016-01-21 MED ORDER — SUGAMMADEX SODIUM 200 MG/2ML IV SOLN
INTRAVENOUS | Status: AC
  Filled 2016-01-21: qty 2

## 2016-01-21 MED ORDER — SUGAMMADEX SODIUM 200 MG/2ML IV SOLN
INTRAVENOUS | Status: DC | PRN
  Administered 2016-01-21: 150 mg via INTRAVENOUS

## 2016-01-21 MED ORDER — LIDOCAINE HCL (CARDIAC) 20 MG/ML IV SOLN
INTRAVENOUS | Status: AC
  Filled 2016-01-21: qty 5

## 2016-01-21 MED ORDER — PHENYLEPHRINE HCL 10 MG/ML IJ SOLN
INTRAMUSCULAR | Status: DC | PRN
  Administered 2016-01-21 (×2): 80 ug via INTRAVENOUS

## 2016-01-21 MED ORDER — FENTANYL CITRATE (PF) 100 MCG/2ML IJ SOLN
INTRAMUSCULAR | Status: AC
  Filled 2016-01-21: qty 2

## 2016-01-21 MED ORDER — DEXAMETHASONE SODIUM PHOSPHATE 10 MG/ML IJ SOLN
INTRAMUSCULAR | Status: AC
  Filled 2016-01-21: qty 1

## 2016-01-21 MED ORDER — ACETAMINOPHEN 325 MG PO TABS: 650.0000 mg | ORAL_TABLET | ORAL | Status: DC | PRN

## 2016-01-21 MED ORDER — STERILE WATER FOR IRRIGATION IR SOLN
Status: DC | PRN
  Administered 2016-01-21: 18000 mL

## 2016-01-21 MED ORDER — MEPERIDINE HCL 50 MG/ML IJ SOLN: 6.2500 mg | INTRAMUSCULAR | Status: DC | PRN

## 2016-01-21 MED ORDER — FENTANYL CITRATE (PF) 250 MCG/5ML IJ SOLN
INTRAMUSCULAR | Status: AC
  Filled 2016-01-21: qty 5

## 2016-01-21 MED ORDER — LIDOCAINE HCL (CARDIAC) 10 MG/ML IV SOLN
INTRAVENOUS | Status: DC | PRN
  Administered 2016-01-21: 100 mg via INTRAVENOUS

## 2016-01-21 MED ORDER — HYDROCHLOROTHIAZIDE 12.5 MG PO CAPS
12.5000 mg | ORAL_CAPSULE | Freq: Every day | ORAL | Status: DC
  Administered 2016-01-21 – 2016-01-22 (×2): 12.5 mg via ORAL
  Filled 2016-01-21 (×2): qty 1

## 2016-01-21 MED ORDER — CEFAZOLIN SODIUM-DEXTROSE 2-4 GM/100ML-% IV SOLN
INTRAVENOUS | Status: AC
  Filled 2016-01-21: qty 100

## 2016-01-21 MED ORDER — CIPROFLOXACIN IN D5W 400 MG/200ML IV SOLN
INTRAVENOUS | Status: AC
  Filled 2016-01-21: qty 200

## 2016-01-21 MED ORDER — ONDANSETRON HCL 4 MG/2ML IJ SOLN: 4.0000 mg | INTRAMUSCULAR | Status: DC | PRN

## 2016-01-21 MED ORDER — DONEPEZIL HCL 5 MG PO TABS
5.0000 mg | ORAL_TABLET | Freq: Every day | ORAL | Status: DC
  Administered 2016-01-21: 5 mg via ORAL
  Filled 2016-01-21: qty 1

## 2016-01-21 MED ORDER — HYDROCODONE-ACETAMINOPHEN 5-325 MG PO TABS: 1.0000 | ORAL_TABLET | ORAL | Status: DC | PRN

## 2016-01-21 MED ORDER — KCL IN DEXTROSE-NACL 20-5-0.45 MEQ/L-%-% IV SOLN
INTRAVENOUS | Status: DC
  Administered 2016-01-21: 21:00:00 via INTRAVENOUS
  Filled 2016-01-21 (×3): qty 1000

## 2016-01-21 MED ORDER — PROPOFOL 10 MG/ML IV BOLUS
INTRAVENOUS | Status: AC
  Filled 2016-01-21: qty 20

## 2016-01-21 MED ORDER — HYDROCODONE-ACETAMINOPHEN 5-325 MG PO TABS: 1.0000 | ORAL_TABLET | Freq: Four times a day (QID) | ORAL | Status: DC | PRN

## 2016-01-21 MED ORDER — LISINOPRIL 10 MG PO TABS
10.0000 mg | ORAL_TABLET | Freq: Every day | ORAL | Status: DC
Start: 2016-01-21 — End: 2016-01-22
  Administered 2016-01-21 – 2016-01-22 (×2): 10 mg via ORAL
  Filled 2016-01-21 (×2): qty 1

## 2016-01-21 MED ORDER — ONDANSETRON HCL 4 MG/2ML IJ SOLN
INTRAMUSCULAR | Status: AC
  Filled 2016-01-21: qty 2

## 2016-01-21 MED ORDER — MIDAZOLAM HCL 2 MG/2ML IJ SOLN
INTRAMUSCULAR | Status: AC
  Filled 2016-01-21: qty 2

## 2016-01-21 MED ORDER — LABETALOL HCL 5 MG/ML IV SOLN
INTRAVENOUS | Status: DC | PRN
  Administered 2016-01-21: 2.5 mg via INTRAVENOUS

## 2016-01-21 MED ORDER — ONDANSETRON HCL 4 MG/2ML IJ SOLN
INTRAMUSCULAR | Status: DC | PRN
  Administered 2016-01-21: 4 mg via INTRAVENOUS

## 2016-01-21 MED ORDER — LACTATED RINGERS IV SOLN
INTRAVENOUS | Status: DC | PRN
  Administered 2016-01-21 (×2): via INTRAVENOUS

## 2016-01-21 MED ORDER — CIPROFLOXACIN IN D5W 400 MG/200ML IV SOLN
400.0000 mg | Freq: Once | INTRAVENOUS | Status: AC
  Administered 2016-01-21: 400 mg via INTRAVENOUS

## 2016-01-21 MED ORDER — PROPOFOL 10 MG/ML IV BOLUS
INTRAVENOUS | Status: DC | PRN
  Administered 2016-01-21 (×2): 120 mg via INTRAVENOUS

## 2016-01-21 MED ORDER — FENTANYL CITRATE (PF) 100 MCG/2ML IJ SOLN
INTRAMUSCULAR | Status: DC | PRN
  Administered 2016-01-21: 100 ug via INTRAVENOUS
  Administered 2016-01-21 (×3): 50 ug via INTRAVENOUS

## 2016-01-21 MED ORDER — ROCURONIUM BROMIDE 100 MG/10ML IV SOLN
INTRAVENOUS | Status: DC | PRN
  Administered 2016-01-21: 40 mg via INTRAVENOUS

## 2016-01-21 MED ORDER — SUCCINYLCHOLINE CHLORIDE 20 MG/ML IJ SOLN
INTRAMUSCULAR | Status: DC | PRN
  Administered 2016-01-21: 60 mg via INTRAVENOUS
  Administered 2016-01-21: 40 mg via INTRAVENOUS

## 2016-01-21 MED ORDER — LISINOPRIL-HYDROCHLOROTHIAZIDE 10-12.5 MG PO TABS: 1.0000 | ORAL_TABLET | Freq: Every day | ORAL | Status: DC

## 2016-01-21 MED ORDER — PHENYLEPHRINE 40 MCG/ML (10ML) SYRINGE FOR IV PUSH (FOR BLOOD PRESSURE SUPPORT)
PREFILLED_SYRINGE | INTRAVENOUS | Status: AC
  Filled 2016-01-21: qty 10

## 2016-01-21 MED ORDER — METOCLOPRAMIDE HCL 5 MG/ML IJ SOLN
10.0000 mg | Freq: Once | INTRAMUSCULAR | Status: DC | PRN
Start: 2016-01-21 — End: 2016-01-21

## 2016-01-21 MED ORDER — FENTANYL CITRATE (PF) 100 MCG/2ML IJ SOLN
25.0000 ug | INTRAMUSCULAR | Status: DC | PRN
  Administered 2016-01-21: 25 ug via INTRAVENOUS
  Administered 2016-01-21: 50 ug via INTRAVENOUS
  Administered 2016-01-21: 25 ug via INTRAVENOUS

## 2016-01-21 MED ORDER — PHENAZOPYRIDINE HCL 100 MG PO TABS: 100.0000 mg | ORAL_TABLET | Freq: Three times a day (TID) | ORAL | Status: DC | PRN

## 2016-01-21 SURGICAL SUPPLY — 13 items
BAG URO CATCHER STRL LF (MISCELLANEOUS) ×3 IMPLANT
CATH INTERMIT  6FR 70CM (CATHETERS) ×3 IMPLANT
CLOTH BEACON ORANGE TIMEOUT ST (SAFETY) ×3 IMPLANT
GLOVE BIOGEL M STRL SZ7.5 (GLOVE) ×3 IMPLANT
GOWN STRL REUS W/TWL LRG LVL3 (GOWN DISPOSABLE) ×6 IMPLANT
GUIDEWIRE STR DUAL SENSOR (WIRE) ×3 IMPLANT
LOOP CUT BIPOLAR 24F LRG (ELECTROSURGICAL) IMPLANT
LOOP MONOPOLAR YLW (ELECTROSURGICAL) ×2 IMPLANT
MANIFOLD NEPTUNE II (INSTRUMENTS) ×3 IMPLANT
PACK CYSTO (CUSTOM PROCEDURE TRAY) ×3 IMPLANT
SYRINGE IRR TOOMEY STRL 70CC (SYRINGE) ×2 IMPLANT
TUBING CONNECTING 10 (TUBING) ×2 IMPLANT
TUBING CONNECTING 10' (TUBING) ×1

## 2016-01-21 NOTE — Op Note (Signed)
Preoperative diagnosis: Gross hematuria  Postoperative diagnosis: Gross hematuria, large bladder tumor (greater than 5 cm)  Procedure: Cystoscopy, clot evacuation, transurethral resection of large bladder tumor  Surgeon: Dr. Roxy Horseman, Brooke Bonito.  Anesthesia: General  Complications: None  Estimated blood loss: 100 cc  Specimen: Bladder tumor  Disposition of specimen: Pathology  Intraoperative findings: A large papillary bladder tumor was identified.  The right lateral posterior wall of the bladder measuring approximately 6 cm.  Indication: Brian Montgomery is a 80 year old gentleman with gross hematuria.  He recently was evaluated in the emergency department and a catheter was placed.  He was seen in the office and underwent cystoscopy. Office cystoscopy suggested that he had a large clot in his bladder and was scheduled to undergo cystoscopy and clot evacuation today.  We discussed the potential risks, complications, and alternative options associated with this procedure.  Informed consent was obtained.  Description of procedure: The patient was taken to the operating room and a general anesthetic was administered.  He was given preoperative antibiotics, placed in the dorsal lithotomy position, and prepped and draped in the usual sterile fashion.  Next, a preoperative timeout was performed.  Cystourethroscopy was then performed with a 30 lens and this revealed a normal anterior and posterior urethra.  Inspection of the bladder revealed a large clot again noted toward the right side of the bladder.  However, on further inspection, it was revealed that he indeed had a large bladder tumor underlying this clot that was not appreciated on office cystoscopy.  At this point, the cystoscope was exchanged for a 24 [place] resectoscope.   The patient was converted to general endotracheal intubation and was paralyzed.  He underwent monopolar transurethral resection of his bladder tumor.  This was an  extensive resection considering the very large size of his bladder tumor that measured approximately 6 cm extending off the right side of the bladder near the junction of the lateral and posterior walls.  This extended and filled up the majority of his bladder extending well past midline.  The tumor was systematically resected with loop cutting current.  Cautery was used to maintain hemostasis.  The tumor was completely grossly resected.  However, due to the very large size of the tumor, was certainly possible that additional tumor may be present.  Considering the size of the resection, it was decided to stop the procedure following resection of all grossly visible tumor with plans to consider a second stage procedure if necessary in the future.  Bladder was emptied and all bladder tumor specimen was removed via a [place] syringe.  Reinspection of the bladder and sure that all tumor was removed and that hemostasis was excellent.  An 24 French Foley catheter was inserted into the bladder.  The patient will be admitted for observation.  He tolerated the procedure well without complications.  He was able to be extubated and transferred to the recovery unit in satisfactory condition.

## 2016-01-21 NOTE — Interval H&P Note (Signed)
History and Physical Interval Note:  01/21/2016 4:55 PM  Brian Montgomery  has presented today for surgery, with the diagnosis of HEMATURIA  The various methods of treatment have been discussed with the patient and family. After consideration of risks, benefits and other options for treatment, the patient has consented to  Procedure(s): CYSTOSCOPY WITH RETROGRADE PYELOGRAM  CLOT EVACUATION (N/A) as a surgical intervention .  The patient's history has been reviewed, patient examined, no change in status, stable for surgery.  I have reviewed the patient's chart and labs.  Questions were answered to the patient's satisfaction.     Colvin Blatt,LES

## 2016-01-21 NOTE — Transfer of Care (Signed)
Immediate Anesthesia Transfer of Care Note  Patient: Brian Montgomery  Procedure(s) Performed: Procedure(s): CYSTOSCOPY WITH TURBT WITH  CLOT EVACUATION (N/A)  Patient Location: PACU  Anesthesia Type:General  Level of Consciousness: awake, alert , oriented and patient cooperative  Airway & Oxygen Therapy: Patient Spontanous Breathing and Patient connected to face mask oxygen  Post-op Assessment: Report given to RN, Post -op Vital signs reviewed and stable and Patient moving all extremities  Post vital signs: Reviewed and stable  Last Vitals:  Filed Vitals:   01/21/16 1453  BP: 140/92  Pulse: 99  Temp: 36.6 C  Resp: 18    Last Pain: There were no vitals filed for this visit.       Complications: No apparent anesthesia complications

## 2016-01-21 NOTE — Anesthesia Preprocedure Evaluation (Signed)
Anesthesia Evaluation  Patient identified by MRN, date of birth, ID band Patient awake    Reviewed: Allergy & Precautions, NPO status , Patient's Chart, lab work & pertinent test results  Airway Mallampati: II  TM Distance: >3 FB Neck ROM: Full    Dental no notable dental hx. (+) Edentulous Upper, Partial Upper   Pulmonary former smoker,    Pulmonary exam normal breath sounds clear to auscultation       Cardiovascular hypertension, Pt. on medications negative cardio ROS Normal cardiovascular exam Rhythm:Regular Rate:Normal     Neuro/Psych negative neurological ROS  negative psych ROS   GI/Hepatic negative GI ROS, Neg liver ROS,   Endo/Other  negative endocrine ROS  Renal/GU negative Renal ROS  negative genitourinary   Musculoskeletal negative musculoskeletal ROS (+)   Abdominal   Peds negative pediatric ROS (+)  Hematology negative hematology ROS (+)   Anesthesia Other Findings   Reproductive/Obstetrics negative OB ROS                             Anesthesia Physical Anesthesia Plan  ASA: II  Anesthesia Plan: General   Post-op Pain Management:    Induction: Intravenous  Airway Management Planned: LMA  Additional Equipment:   Intra-op Plan:   Post-operative Plan: Extubation in OR  Informed Consent: I have reviewed the patients History and Physical, chart, labs and discussed the procedure including the risks, benefits and alternatives for the proposed anesthesia with the patient or authorized representative who has indicated his/her understanding and acceptance.   Dental advisory given  Plan Discussed with: CRNA  Anesthesia Plan Comments:         Anesthesia Quick Evaluation

## 2016-01-21 NOTE — Anesthesia Postprocedure Evaluation (Signed)
Anesthesia Post Note  Patient: Brian Montgomery  Procedure(s) Performed: Procedure(s) (LRB): CYSTOSCOPY WITH TURBT WITH  CLOT EVACUATION (N/A)  Patient location during evaluation: PACU Anesthesia Type: General Level of consciousness: awake and alert Pain management: pain level controlled Vital Signs Assessment: post-procedure vital signs reviewed and stable Respiratory status: spontaneous breathing, nonlabored ventilation, respiratory function stable and patient connected to nasal cannula oxygen Cardiovascular status: blood pressure returned to baseline and stable Postop Assessment: no signs of nausea or vomiting Anesthetic complications: no    Last Vitals:  Filed Vitals:   01/21/16 1453  BP: 140/92  Pulse: 99  Temp: 36.6 C  Resp: 18    Last Pain: There were no vitals filed for this visit.               Montez Hageman

## 2016-01-21 NOTE — Discharge Instructions (Signed)
1. You may see some blood in the urine and may have some burning with urination for 48-72 hours. You also may notice that you have to urinate more frequently or urgently after your procedure which is normal.  °2. You should call should you develop an inability urinate, fever > 101, persistent nausea and vomiting that prevents you from eating or drinking to stay hydrated.  °

## 2016-01-21 NOTE — Anesthesia Procedure Notes (Signed)
Procedure Name: Intubation Date/Time: 01/21/2016 5:50 PM Performed by: Lissa Morales Pre-anesthesia Checklist: Patient identified, Emergency Drugs available, Suction available and Patient being monitored Patient Re-evaluated:Patient Re-evaluated prior to inductionOxygen Delivery Method: Circle system utilized Preoxygenation: Pre-oxygenation with 100% oxygen Intubation Type: IV induction Ventilation: Mask ventilation without difficulty Laryngoscope Size: Mac and 4 Grade View: Grade II Tube type: Oral Tube size: 7.5 mm Number of attempts: 1 Airway Equipment and Method: Stylet and Oral airway Placement Confirmation: ETT inserted through vocal cords under direct vision,  positive ETCO2 and breath sounds checked- equal and bilateral Secured at: 21 cm Tube secured with: Tape Dental Injury: Teeth and Oropharynx as per pre-operative assessment

## 2016-01-22 ENCOUNTER — Encounter (HOSPITAL_COMMUNITY): Payer: Self-pay | Admitting: Urology

## 2016-01-22 DIAGNOSIS — Z79899 Other long term (current) drug therapy: Secondary | ICD-10-CM | POA: Diagnosis not present

## 2016-01-22 DIAGNOSIS — I10 Essential (primary) hypertension: Secondary | ICD-10-CM | POA: Diagnosis not present

## 2016-01-22 DIAGNOSIS — N403 Nodular prostate with lower urinary tract symptoms: Secondary | ICD-10-CM | POA: Diagnosis not present

## 2016-01-22 DIAGNOSIS — C678 Malignant neoplasm of overlapping sites of bladder: Secondary | ICD-10-CM | POA: Diagnosis not present

## 2016-01-22 DIAGNOSIS — G629 Polyneuropathy, unspecified: Secondary | ICD-10-CM | POA: Diagnosis not present

## 2016-01-22 DIAGNOSIS — E785 Hyperlipidemia, unspecified: Secondary | ICD-10-CM | POA: Diagnosis not present

## 2016-01-22 LAB — BASIC METABOLIC PANEL
Anion gap: 4 — ABNORMAL LOW (ref 5–15)
BUN: 16 mg/dL (ref 6–20)
CALCIUM: 9.2 mg/dL (ref 8.9–10.3)
CO2: 24 mmol/L (ref 22–32)
CREATININE: 1.16 mg/dL (ref 0.61–1.24)
Chloride: 107 mmol/L (ref 101–111)
GFR calc Af Amer: >60 mL/min (ref >60)
GFR calc non Af Amer: 58 mL/min — ABNORMAL LOW (ref >60)
Glucose, Bld: 139 mg/dL — ABNORMAL HIGH (ref 65–99)
POTASSIUM: 4.5 mmol/L (ref 3.5–5.1)
SODIUM: 135 mmol/L (ref 135–145)

## 2016-01-22 LAB — HEMOGLOBIN AND HEMATOCRIT, BLOOD
HEMATOCRIT: 34.4 % — AB (ref 39.0–52.0)
HEMOGLOBIN: 11.7 g/dL — AB (ref 13.0–17.0)

## 2016-01-22 NOTE — Discharge Summary (Signed)
  Date of admission: 01/21/2016  Date of discharge: 01/22/2016  Admission diagnosis: Gross hematuria   Discharge diagnosis: Bladder tumor  Secondary diagnoses: Hypertension  History and Physical: For full details, please see admission history and physical. Briefly, Brian Montgomery is a 80 y.o. year old patient with hematuria.   Hospital Course: He was taken to the OR on 01/21/16 for cystoscopy and clot evacuation.  During cystoscopy, it was noted that he actually had a very large bladder tumor rather than just a formed clot.  He underwent TURBT.  An indwelling catheter was left overnight and he was admitted for observation.  His urine was grossly clear the following morning and he underwent a voiding trial.  He was able to void and was discharged home.  Laboratory values:  Recent Labs  01/21/16 2058 01/22/16 0446  HGB 12.1* 11.7*  HCT 35.2* 34.4*    Recent Labs  01/21/16 2058 01/22/16 0446  CREATININE 1.19 1.16    Disposition: Home  Discharge instruction: The patient was instructed to be ambulatory but told to refrain from heavy lifting, strenuous activity.  Discharge medications:    Medication List    STOP taking these medications        aspirin 81 MG tablet      TAKE these medications        donepezil 5 MG tablet  Commonly known as:  ARICEPT  Take 5 mg by mouth at bedtime.     HYDROcodone-acetaminophen 5-325 MG tablet  Commonly known as:  NORCO/VICODIN  Take 1-2 tablets by mouth every 6 (six) hours as needed.     lisinopril-hydrochlorothiazide 10-12.5 MG tablet  Commonly known as:  PRINZIDE,ZESTORETIC  Take 1 tablet by mouth daily.     phenazopyridine 100 MG tablet  Commonly known as:  PYRIDIUM  Take 1 tablet (100 mg total) by mouth 3 (three) times daily as needed for pain (for burning).     simvastatin 20 MG tablet  Commonly known as:  ZOCOR  TAKE 1 TABLET BY MOUTH DAILY        Followup:      Follow-up Information    Follow up with Dutch Gray, MD.    Specialty:  Urology   Why:  Will call to schedule/arrange.   Contact information:   [place] [address] [phone]      He will be scheduled for upper tract imaging and I will contact him with his pathology and arrange appropriate follow up.

## 2016-01-22 NOTE — Care Management Note (Signed)
Case Management Note  Patient Details  Name: Brian Montgomery MRN: [id number] Date of Birth: 1936/02/12  Subjective/Objective: 80 y/o m admitted w/Bladder Ca. From home.                   Action/Plan:d/c home no needs or orders.   Expected Discharge Date:                  Expected Discharge Plan:  Home/Self Care  In-House Referral:     Discharge planning Services  CM Consult  Post Acute Care Choice:    Choice offered to:     DME Arranged:    DME Agency:     HH Arranged:    [place] Agency:     Status of Service:  Completed, signed off  If discussed at [employer] of Stay Meetings, dates discussed:    Additional Comments:  Dessa Phi, RN 01/22/2016, 8:59 AM

## 2016-01-22 NOTE — Care Management Note (Signed)
Case Management Note  Patient Details  Name: Brian Montgomery MRN: [id number] Date of Birth: [birth year]  Subjective/Objective:   Per nsg family questioned if Rolling walker would be needed since he had a cane.  MD called-he stated he does note feel PT cons is appropriate,since not related to acute issues that brought him into hospital-it can be addressed w/patient's pcp. Spoke to patient/family in rm-they voiced understanding.                 Action/Plan:d/c plan home no d/c needs or orders.   Expected Discharge Date:                  Expected Discharge Plan:  Home/Self Care  In-House Referral:     Discharge planning Services  CM Consult  Post Acute Care Choice:    Choice offered to:     DME Arranged:    DME Agency:     HH Arranged:    [place] Agency:     Status of Service:  Completed, signed off  If discussed at [employer] of Stay Meetings, dates discussed:    Additional Comments:  Dessa Phi, RN 01/22/2016, 11:00 AM

## 2016-01-22 NOTE — Care Management Note (Signed)
Case Management Note  Patient Details  Name: Brian Montgomery MRN: [id number] Date of Birth: October 16, 1935  Subjective/Objective:  80 y/o m admitted w/gross hematuria, s/p cystoscopy. From home.                  Action/Plan:d/c plan home.   Expected Discharge Date:                  Expected Discharge Plan:  Home/Self Care  In-House Referral:     Discharge planning Services  CM Consult  Post Acute Care Choice:    Choice offered to:     DME Arranged:    DME Agency:     HH Arranged:    [place] Agency:     Status of Service:  Completed, signed off  If discussed at [employer] of Stay Meetings, dates discussed:    Additional Comments:  Dessa Phi, RN 01/22/2016, 9:25 AM

## 2016-01-22 NOTE — Progress Notes (Signed)
Patient ID: Brian Montgomery, male   DOB: 1935/11/10, 80 y.o.   MRN: [id number]  1 Day Post-Op Subjective: Doing well s/p TURBT last night.  No complaints.  Objective: Vital signs in last 24 hours: Temp:  [97.8 F (36.6 C)-98.7 F (37.1 C)] 98.1 F (36.7 C) (07/14 0542) Pulse Rate:  [81-99] 96 (07/14 0542) Resp:  [14-18] 16 (07/14 0542) BP: (123-161)/(64-92) 123/64 mmHg (07/14 0542) SpO2:  [99 %-100 %] 99 % (07/14 0542) Weight:  [77.565 kg (171 lb)] 77.565 kg (171 lb) (07/13 1453)  Intake/Output from previous day: 07/13 0701 - 07/14 0700 In: 3187.5 [P.O.:840; I.V.:2347.5] Out: 2050 [id number]; Blood:100] Intake/Output this shift: Total I/O In: 1587.5 [P.O.:840; I.V.:747.5] Out: 1800 [Urine:1800]  Physical Exam:  General: Alert and oriented GU: Urine clear  Lab Results:  Recent Labs  01/21/16 2058 01/22/16 0446  HGB 12.1* 11.7*  HCT 35.2* 34.4*   BMET  Recent Labs  01/21/16 2058 01/22/16 0446  NA 134* 135  K 4.0 4.5  CL 104 107  CO2 25 24  GLUCOSE 143* 139*  BUN 19 16  CREATININE 1.19 1.16  CALCIUM 8.8* 9.2     Studies/Results: No results found.  Assessment/Plan: D/C catheter for voiding trial.  D/C home.  F/U with CT scan and to discuss pathology.     Ashlyn Cabler,LES 01/22/2016, 6:56 AM

## 2016-01-22 NOTE — Care Management Obs Status (Signed)
[facility] NOTIFICATION   Patient Details  Name: Brian Montgomery MRN: [id number] Date of Birth: Mar 02, 1936   Medicare Observation Status Notification Given:  Yes    MahabirJuliann Pulse, RN 01/22/2016, 9:23 AM

## 2016-01-25 ENCOUNTER — Telehealth: Payer: Self-pay

## 2016-01-25 NOTE — Telephone Encounter (Signed)
He should rest today and make sure that he hydrates.  Since he just had a procedure, it probably is a good idea to call that doctor as well.  I will evaluate him tomorrow to determine what further testing is needed. If symptoms get worse, he should be brought to ED for evaluation.

## 2016-01-25 NOTE — Telephone Encounter (Signed)
Daughter called wanting same day appointment due to increased dizziness and falls. Pt fell this morning, and a few other times recently, has been complaining of increased dizziness. Pt did not suffer injury when he fell. Pt was scheduled for tomorrow at 9 a.m. Pt did also just have surgery. Daughter stated the two were unrelated. Daughter stated that PT did not recommend pt use a assisted device when walking. Asked that I contact them and tell them pt needs one. Advised that it could be discussed tomorrow and if PT needed contacted would do it at that time. Daughter requested a note be sent to provider to see if he had further recommendations on what they should do today. Please advise

## 2016-01-25 NOTE — Telephone Encounter (Signed)
Daughter aware.  Please call the daughter [name] [phone]- cell and [phone] work.

## 2016-01-26 ENCOUNTER — Encounter: Payer: Self-pay | Admitting: Neurology

## 2016-01-26 ENCOUNTER — Other Ambulatory Visit: Payer: Self-pay | Admitting: Neurology

## 2016-01-26 ENCOUNTER — Ambulatory Visit (INDEPENDENT_AMBULATORY_CARE_PROVIDER_SITE_OTHER): Payer: Medicare Other | Admitting: Neurology

## 2016-01-26 ENCOUNTER — Other Ambulatory Visit: Payer: Self-pay

## 2016-01-26 ENCOUNTER — Telehealth: Payer: Self-pay

## 2016-01-26 VITALS — BP 132/68 | HR 98 | Ht 65.0 in | Wt 174.0 lb

## 2016-01-26 DIAGNOSIS — R296 Repeated falls: Secondary | ICD-10-CM

## 2016-01-26 DIAGNOSIS — R42 Dizziness and giddiness: Secondary | ICD-10-CM | POA: Diagnosis not present

## 2016-01-26 DIAGNOSIS — R29898 Other symptoms and signs involving the musculoskeletal system: Secondary | ICD-10-CM

## 2016-01-26 DIAGNOSIS — M6289 Other specified disorders of muscle: Secondary | ICD-10-CM

## 2016-01-26 DIAGNOSIS — G231 Progressive supranuclear ophthalmoplegia [Steele-Richardson-Olszewski]: Secondary | ICD-10-CM

## 2016-01-26 DIAGNOSIS — R2681 Unsteadiness on feet: Secondary | ICD-10-CM

## 2016-01-26 MED ORDER — CARBIDOPA-LEVODOPA 25-100 MG PO TABS: ORAL_TABLET | ORAL | Status: DC

## 2016-01-26 NOTE — Telephone Encounter (Signed)
The family told me that he only has a cane.  I recommend PT contact them again to go over recommendations.

## 2016-01-26 NOTE — Progress Notes (Signed)
NEUROLOGY FOLLOW UP OFFICE NOTE  Benno Shangraw [id number]  HISTORY OF PRESENT ILLNESS: Brian Montgomery is a 80 year old right-handed man with hypertension, dyslipidemia, and arthritis who follows up for dizziness and increased falls.  UPDATE: Last week, he underwent cystoscopy for hematuria with plan to perform clot evacuation.  It was determined that he had a large bladder tumor rather than just a formed clot.  He underwent TURBT.  He did well the next day and was discharged.  Yesterday, he got up and felt dizzy, described as spinning sensation for a couple of minutes.  He continues to have falls.  He saw PT for a couple of sessions but reportedly made no specific recommendations as per daughter and wife.  Left hand weakness improved but not resolved.  He needed to reschedule NCV-EMG.  HISTORY: For about a year, he has had increased problems with balance.  When he gets up, he has to take a moment to maintain his balance.  He sometimes feels has to push off.  When he is walking, he feels like his body is being pulled to either side.  There is no spinning sensation, lightheadedness, nausea, vomiting, slurred speech, visual disturbance or numbness or weakness.  He denies neck pain, low back pain or pain in the legs when he walks or stands.  He has longstanding problems with hearing but no tinnitus.  Orthostatics were checked, which were negative.    His daughters do not notice any significant memory problems.  His wife pays the bills.  He is able to perform all of his ADLs.  He does have some urinary frequency but has BPH.  He does have symptoms suggestive of REM sleep behavior disorder (he will scream and thrash around in his sleep, one time falling out of bed).  He denies tremor.  MoCA score from 07/27/15 was 10.  MRI of the brain from 10/27/14 showed mild to moderate chronic small vessel disease and mild global atrophy.    MRI of cervical spine from 09/01/15 showed multilevel spondylosis and mild spinal  stenosis without cord deformity or signal abnormality.  He was referred again to PT.  He continues to fall and had recent fall on 11/14/15, which required an ED visit.  He fell on his left side/shoulder.  He had dislocated his shoulder but was able to pop it back in.  X-ray of left shoulder in the ED revealed osteoarthritic changes but no fracture or dislocation.  Since the fall, he has had numbness and weakness of his left hand.  He notes pain in the shoulder but denies neck pain. Labs, including TSH, glucose, ANA, Sed Rate, B12, RPR, SPEP/IFE were normal.    He does have history of low back surgery and left knee replacement.  PAST MEDICAL HISTORY: Past Medical History  Diagnosis Date  . ED (erectile dysfunction)   . Dyslipidemia   . Hypertension   . Arthritis     MEDICATIONS: Current Outpatient Prescriptions on File Prior to Visit  Medication Sig Dispense Refill  . donepezil (ARICEPT) 5 MG tablet Take 5 mg by mouth at bedtime.    Marland Kitchen HYDROcodone-acetaminophen (NORCO/VICODIN) 5-325 MG tablet Take 1-2 tablets by mouth every 6 (six) hours as needed. 25 tablet 0  . lisinopril-hydrochlorothiazide (PRINZIDE,ZESTORETIC) 10-12.5 MG tablet Take 1 tablet by mouth daily. 90 tablet 3  . phenazopyridine (PYRIDIUM) 100 MG tablet Take 1 tablet (100 mg total) by mouth 3 (three) times daily as needed for pain (for burning). 20 tablet 0  .  simvastatin (ZOCOR) 20 MG tablet TAKE 1 TABLET BY MOUTH DAILY (Patient taking differently: TAKE 1 TABLET BY MOUTH DAILY IN MORNING) 90 tablet 1   No current facility-administered medications on file prior to visit.    ALLERGIES: No Known Allergies  FAMILY HISTORY: Family History  Problem Relation Age of Onset  . Heart failure Mother   . Heart failure Father   . Hypertension Mother   . Hypertension Father   . Renal Disease Sister   . Diabetes Brother     SOCIAL HISTORY: Social History   Social History  . Marital Status: Married    Spouse Name: N/A  .  Number of Children: N/A  . Years of Education: N/A   Occupational History  . Not on file.   Social History Main Topics  . Smoking status: Former Research scientist (life sciences)  . Smokeless tobacco: Never Used  . Alcohol Use: 0.0 oz/week    0 Standard drinks or equivalent per week     Comment: occasional beer or wine   . Drug Use: No  . Sexual Activity:    Partners: Female   Other Topics Concern  . Not on file   Social History Narrative    REVIEW OF SYSTEMS: Constitutional: No fevers, chills, or sweats, no generalized fatigue, change in appetite Eyes: No visual changes, double vision, eye pain Ear, nose and throat: No hearing loss, ear pain, nasal congestion, sore throat Cardiovascular: No chest pain, palpitations Respiratory:  No shortness of breath at rest or with exertion, wheezes GastrointestinaI: No nausea, vomiting, diarrhea, abdominal pain, fecal incontinence Genitourinary:  No dysuria, urinary retention or frequency Musculoskeletal:  No neck pain, back pain Integumentary: No rash, pruritus, skin lesions Neurological: as above Psychiatric: No depression, insomnia, anxiety Endocrine: No palpitations, fatigue, diaphoresis, mood swings, change in appetite, change in weight, increased thirst Hematologic/Lymphatic:  No purpura, petechiae. Allergic/Immunologic: no itchy/runny eyes, nasal congestion, recent allergic reactions, rashes  PHYSICAL EXAM: Filed Vitals:   01/26/16 0904  BP: 132/68  Pulse: 98   General: No acute distress.  Patient appears well-groomed.  normal body habitus. Head:  Normocephalic/atraumatic Eyes:  Fundi examined but not visualized Neck: supple, no paraspinal tenderness, full range of motion Heart:  Regular rate and rhythm Lungs:  Clear to auscultation bilaterally Back: No paraspinal tenderness Neurological Exam:alert and oriented to person, place, and time. Attention span and concentration intact, recent and remote memory intact, fund of knowledge intact.  Speech  fluent and not dysarthric, language intact.   Decreased upward and downward gaze on tracking.  Otherwise, CN II-XII intact. Bulk and tone normal, 3+ left deltoid and 5- left hand grip.  Otherwise, muscle strength 5/5 throughout.  Decreased finger-thumb tapping speed and amplitude.  No cogwheel rigidity or tremor.  Sensation to pinprick reduced in the left hand and forearm.  Deep tendon reflexes 2+ throughout.  Finger to nose testing intact.  Shuffling gait.  Freezing.  Requires several steps to turn.  Cannot tandem walk.  Romberg positive.  IMPRESSION: Suspect progressive supranuclear palsy Left hand weakness/numbness Frequent falls Dizziness, suspect inner ear etiology.  PLAN: 1.  We will try a trial of Sinemet to see how he responds in regards to gait and falls. 2.  I would like to review notes from PT.  I think he will need an assisted device other than a cane, such as a rolling walker with seat. 3.  Advised to be careful with changing positions  4.  Reschedule NCV-EMG of left upper extremity. 5.  Follow up  in 3 months.  25 minutes spent face to face with patient, over 50% spent counseling.  Metta Clines, DO  CC:  Jill Alexanders, MD

## 2016-01-26 NOTE — Patient Instructions (Addendum)
1. Start carbidopa (Sinemet) 25/100mg  tablets.  Day 1-3: Take 0.5 tablet in morning and 0.5 tablet in evening/bedtime.  Day 4-7: Take 0.5 tablet in morning, 0.5 tablet in afternoon, and 0.5 tablet in evening/bedtime.  Week 2 & thereafter: Take 1 tablet three times daily.   Take the medication at the same time everyday. The medication does not get absorbed into your body as well, if you take it with protein-containing foods (meat, dairy, beans). Try taking the medication about one hour before meals. If you experience nausea by taking it on an empty stomach, you may take it with carbohydrate-containing food,such as bread.  Side effects to look out for, include dizziness, nausea, vivid dreams and hallucinations. If you experience any of these symptoms, please call us.  2.  Will check notes from physical therapist and will get back to you regarding a walker.  3.  Recommend looking into getting help or caregiver to help out.  4.  Reschedule nerve study for left arm.  5.  Follow up in 3 months.

## 2016-01-26 NOTE — Telephone Encounter (Signed)
Called and spoke to  Clayton, PT, from [facility]. He stated that he had discharged patient a couple of weeks ago, at that time he stated pt had both a cane and walker at his home he was using. P.T. Stated he had educated patient on proper use of medical equipment, but did not place an order for any new equipment. PT volunteered to reach back out to patient to emphasized importance of use. Please advise.

## 2016-01-26 NOTE — Telephone Encounter (Signed)
New order for PT H.H. Placed, so P.T. May revisit patient if needed for best recommendation.

## 2016-01-27 ENCOUNTER — Ambulatory Visit: Payer: BLUE CROSS/BLUE SHIELD | Admitting: Neurology

## 2016-01-27 ENCOUNTER — Telehealth: Payer: Self-pay | Admitting: Neurology

## 2016-01-27 ENCOUNTER — Other Ambulatory Visit: Payer: Self-pay | Admitting: Neurology

## 2016-01-27 NOTE — Telephone Encounter (Signed)
Brian Montgomery [birth year]. Brian Montgomery [birth year]. She was calling needing to see if  You have heard anything from PT regarding a walker for him. Her # is [phone]. Thank you

## 2016-01-27 NOTE — Telephone Encounter (Signed)
Called Mrs. Hooker and let her know that I had reached out to [facility], PT, from [place] yesterday. He said he would come back out to do a evaluation on patient to help identify exactly which medical equipment would be best. Advised to call back on Monday if they'd not heard yet from [facility].

## 2016-01-28 DIAGNOSIS — R2681 Unsteadiness on feet: Secondary | ICD-10-CM | POA: Diagnosis not present

## 2016-01-28 DIAGNOSIS — G231 Progressive supranuclear ophthalmoplegia [Steele-Richardson-Olszewski]: Secondary | ICD-10-CM | POA: Diagnosis not present

## 2016-01-29 DIAGNOSIS — C674 Malignant neoplasm of posterior wall of bladder: Secondary | ICD-10-CM | POA: Diagnosis not present

## 2016-01-29 DIAGNOSIS — C679 Malignant neoplasm of bladder, unspecified: Secondary | ICD-10-CM | POA: Diagnosis not present

## 2016-01-29 DIAGNOSIS — C678 Malignant neoplasm of overlapping sites of bladder: Secondary | ICD-10-CM | POA: Diagnosis not present

## 2016-02-01 DIAGNOSIS — G231 Progressive supranuclear ophthalmoplegia [Steele-Richardson-Olszewski]: Secondary | ICD-10-CM | POA: Diagnosis not present

## 2016-02-01 DIAGNOSIS — R2681 Unsteadiness on feet: Secondary | ICD-10-CM | POA: Diagnosis not present

## 2016-02-02 DIAGNOSIS — C678 Malignant neoplasm of overlapping sites of bladder: Secondary | ICD-10-CM | POA: Diagnosis not present

## 2016-02-02 DIAGNOSIS — G231 Progressive supranuclear ophthalmoplegia [Steele-Richardson-Olszewski]: Secondary | ICD-10-CM | POA: Diagnosis not present

## 2016-02-02 DIAGNOSIS — R2681 Unsteadiness on feet: Secondary | ICD-10-CM | POA: Diagnosis not present

## 2016-02-03 ENCOUNTER — Encounter: Payer: Self-pay | Admitting: Oncology

## 2016-02-03 ENCOUNTER — Telehealth: Payer: Self-pay | Admitting: Oncology

## 2016-02-03 DIAGNOSIS — R2681 Unsteadiness on feet: Secondary | ICD-10-CM | POA: Diagnosis not present

## 2016-02-03 DIAGNOSIS — G231 Progressive supranuclear ophthalmoplegia [Steele-Richardson-Olszewski]: Secondary | ICD-10-CM | POA: Diagnosis not present

## 2016-02-03 NOTE — Telephone Encounter (Signed)
Spoke to the patient's spouse to schedule appointment. Appointment made with FS on 02/16/16 at 2pm. Demographics verified. Letter to the referring.

## 2016-02-04 ENCOUNTER — Encounter: Payer: Self-pay | Admitting: *Deleted

## 2016-02-09 DIAGNOSIS — G231 Progressive supranuclear ophthalmoplegia [Steele-Richardson-Olszewski]: Secondary | ICD-10-CM | POA: Diagnosis not present

## 2016-02-09 DIAGNOSIS — R2681 Unsteadiness on feet: Secondary | ICD-10-CM | POA: Diagnosis not present

## 2016-02-10 DIAGNOSIS — R2681 Unsteadiness on feet: Secondary | ICD-10-CM | POA: Diagnosis not present

## 2016-02-10 DIAGNOSIS — G231 Progressive supranuclear ophthalmoplegia [Steele-Richardson-Olszewski]: Secondary | ICD-10-CM | POA: Diagnosis not present

## 2016-02-12 DIAGNOSIS — G231 Progressive supranuclear ophthalmoplegia [Steele-Richardson-Olszewski]: Secondary | ICD-10-CM | POA: Diagnosis not present

## 2016-02-12 DIAGNOSIS — R2681 Unsteadiness on feet: Secondary | ICD-10-CM | POA: Diagnosis not present

## 2016-02-15 ENCOUNTER — Telehealth: Payer: Self-pay | Admitting: *Deleted

## 2016-02-15 DIAGNOSIS — G231 Progressive supranuclear ophthalmoplegia [Steele-Richardson-Olszewski]: Secondary | ICD-10-CM | POA: Diagnosis not present

## 2016-02-15 DIAGNOSIS — R2681 Unsteadiness on feet: Secondary | ICD-10-CM | POA: Diagnosis not present

## 2016-02-15 NOTE — Telephone Encounter (Signed)
Spoke with patient and wife. Reminded them of appointment tomorrow. Will bring medications with them

## 2016-02-16 ENCOUNTER — Telehealth: Payer: Self-pay | Admitting: Oncology

## 2016-02-16 ENCOUNTER — Ambulatory Visit (HOSPITAL_BASED_OUTPATIENT_CLINIC_OR_DEPARTMENT_OTHER): Payer: Medicare Other | Admitting: Oncology

## 2016-02-16 ENCOUNTER — Encounter: Payer: Self-pay | Admitting: Oncology

## 2016-02-16 VITALS — BP 103/69 | HR 66 | Temp 98.1°F | Resp 18 | Wt 174.5 lb

## 2016-02-16 DIAGNOSIS — C679 Malignant neoplasm of bladder, unspecified: Secondary | ICD-10-CM | POA: Diagnosis not present

## 2016-02-16 NOTE — Progress Notes (Signed)
Reason for Referral: Bladder cancer.   HPI: Brian Montgomery is a pleasant 80 year old gentleman referred to me for evaluation of bladder cancer. He currently lives with his wife who is the primary caregiver for him. He is a gentleman with history of hypertension, hyperlipidemia and idiopathic peripheral neuropathy. He has been evaluated by neurology in the past for this issue. He has also had some gait instability and history of falls. He started developing hematuria and was evaluated by Dr. Alinda Money and subsequently underwent cystoscopy and clot evacuation and was found to have a ridge bladder tumor measuring around 5 cm and subsequently underwent transurethral resection of that tumor and extensive resection. The tumor measured approximately 6 cm extending off to the right side of the bladder near the junction of the lateral and posterior wall. The tumor was completely resected grossly. The final pathology from the procedure on 01/21/2016 showed infiltrative high-grade papillary urothelial carcinoma which focally invades into the muscularis propria. Patient tolerated the procedure well and his hematuria resolved. Imaging studies did not reveal any evidence of metastatic disease without any pelvic adenopathy or pulmonary metastasis. I was based on a CT scan chest abdomen and pelvis that was reviewed on 01/29/2016. Options were reviewed with Dr. Alinda Money and is asked to comment about this treatment options.  Clinically, he is relatively asymptomatic. He is limited in his mobility because of his neuropathy and possible cognitive decline. He does not have any symptoms of abdominal pain or pelvic pain. Does not report any hematuria or dysuria. Does not report any flank pain. His appetite remained a reasonable but his performance status is limited.  He does not report any headaches, blurry vision, double vision, syncope or seizures. He does not report any fevers, chills or sweats. He does not report any cough, wheezing  or hemoptysis. He does not report any nausea, vomiting or abdominal pain. He does not report any frequency, hesitancy or urgency. He does not report any skeletal complaints. Remaining review of systems unremarkable.   Past Medical History:  Diagnosis Date  . Arthritis   . Dyslipidemia   . ED (erectile dysfunction)   . Hypertension   :  Past Surgical History:  Procedure Laterality Date  . BACK SURGERY    . COLONOSCOPY  2007   Dr.kaplan  . CYSTOSCOPY W/ RETROGRADES N/A 01/21/2016   Procedure: CYSTOSCOPY WITH TURBT WITH  CLOT EVACUATION;  Surgeon: Raynelle Bring, MD;  Location: [facility];  Service: Urology;  Laterality: N/A;  . left total knee  2011  :   Current Outpatient Prescriptions:  .  carbidopa-levodopa (SINEMET) 25-100 MG tablet, Take 1/2 tab twice daily for 3 days, then 1/2 tab three times daily for 3 days, then 1 tab three times daily., Disp: 90 tablet, Rfl: 0 .  donepezil (ARICEPT) 5 MG tablet, Take 5 mg by mouth at bedtime., Disp: , Rfl:  .  donepezil (ARICEPT) 5 MG tablet, TAKE 1 TABLET(5 MG) BY MOUTH AT BEDTIME, Disp: 90 tablet, Rfl: 0 .  HYDROcodone-acetaminophen (NORCO/VICODIN) 5-325 MG tablet, Take 1-2 tablets by mouth every 6 (six) hours as needed., Disp: 25 tablet, Rfl: 0 .  lisinopril-hydrochlorothiazide (PRINZIDE,ZESTORETIC) 10-12.5 MG tablet, Take 1 tablet by mouth daily., Disp: 90 tablet, Rfl: 3 .  phenazopyridine (PYRIDIUM) 100 MG tablet, Take 1 tablet (100 mg total) by mouth 3 (three) times daily as needed for pain (for burning)., Disp: 20 tablet, Rfl: 0 .  simvastatin (ZOCOR) 20 MG tablet, TAKE 1 TABLET BY MOUTH DAILY (Patient taking differently: TAKE 1  TABLET BY MOUTH DAILY IN MORNING), Disp: 90 tablet, Rfl: 1:  No Known Allergies:  Family History  Problem Relation Age of Onset  . Heart failure Mother   . Heart failure Father   . Hypertension Mother   . Hypertension Father   . Renal Disease Sister   . Diabetes Brother   :  Social History   Social History   . Marital status: Married    Spouse name: N/A  . Number of children: N/A  . Years of education: N/A   Occupational History  . Not on file.   Social History Main Topics  . Smoking status: Former Research scientist (life sciences)  . Smokeless tobacco: Never Used  . Alcohol use 0.0 oz/week     Comment: occasional beer or wine   . Drug use: No  . Sexual activity: Yes    Partners: Female   Other Topics Concern  . Not on file   Social History Narrative  . No narrative on file  :  Pertinent items are noted in HPI.  Exam: Blood pressure 103/69, pulse 66, temperature 98.1 F (36.7 C), temperature source Oral, resp. rate 18, weight 174 lb 8 oz (79.2 kg), SpO2 100 %.  ECOG 0 General appearance: alert and cooperative. Elderly gentleman in a wheelchair without distress. Head: Normocephalic, without obvious abnormality Throat: lips, mucosa, and tongue normal; teeth and gums normal no thrush noted. Neck: no adenopathy or thyroid masses. Back: negative Resp: clear to auscultation bilaterally no rhonchi, wheezes or dullness to percussion. Chest wall: no tenderness Cardio: regular rate and rhythm, S1, S2 normal, no murmur, click, rub or gallop GI: soft, non-tender; bowel sounds normal; no masses,  no organomegaly Extremities: extremities normal, atraumatic, no cyanosis or edema Skin: Skin color, texture, turgor normal. No rashes or lesions Lymph nodes: Cervical, supraclavicular, and axillary nodes normal. Neurologic: Grossly normal    CBC    Component Value Date/Time   WBC 8.7 01/17/2016 2121   RBC 4.36 01/17/2016 2121   HGB 11.7 (L) 01/22/2016 0446   HCT 34.4 (L) 01/22/2016 0446   PLT 226 01/17/2016 2121   MCV 90.1 01/17/2016 2121   MCH 29.8 01/17/2016 2121   MCHC 33.1 01/17/2016 2121   RDW 13.1 01/17/2016 2121   LYMPHSABS 2.1 01/17/2016 2121   MONOABS 0.5 01/17/2016 2121   EOSABS 0.1 01/17/2016 2121   BASOSABS 0.0 01/17/2016 2121     Chemistry      Component Value Date/Time   NA 135  01/22/2016 0446   K 4.5 01/22/2016 0446   CL 107 01/22/2016 0446   CO2 24 01/22/2016 0446   BUN 16 01/22/2016 0446   CREATININE 1.16 01/22/2016 0446   CREATININE 1.22 (H) 06/25/2015 0001      Component Value Date/Time   CALCIUM 9.2 01/22/2016 0446   ALKPHOS 62 06/25/2015 0001   AST 20 06/25/2015 0001   ALT 14 06/25/2015 0001   BILITOT 0.5 06/25/2015 0001       Assessment and Plan:   80 year old gentleman with the following issues:  1. Muscle invasive high-grade papillary urothelial carcinoma of the bladder diagnosed in July 2017. He presented with hematuria and imaging studies revealed a bladder mass. He is status post TURBT on 01/21/2016 and the tumor showed it to be 6 cm mass invading into muscle wall with clinical staging at least T2 N0.  The natural course of this disease was discussed today with the patient, his wife and 2 daughters extensively. He understands he has a rather aggressive tumor  with high risk of recurrence and possible metastatic potential. Without treatment, recurrent disease with morbidity is anticipated. The morbidity includes hematuria, pain and risk of metastasis which will be ultimately lethal.  Treatment options were reviewed which include aggressive therapy including neoadjuvant cisplatinum based therapy followed by radical cystectomy. The complications associated with this therapy were reviewed today in detail including complications associated with chemotherapy. His complications include nausea, vomiting, myelosuppression, neutropenia neutropenic sepsis. Worsening peripheral neuropathy and further decline in his overall quality of life as a possibility. At the mention the complications associated with surgery which have been discussed by Dr. Alinda Money.   Overall, he is a marginal candidate for this approach and he prefers to avoid surgery after the lengthy discussion today.  Alternative options were reviewed today which include bladder preservation utilizing  radiation therapy concomitantly with chemotherapy. I feel this is a more tolerable option although the success rate is certainly less. Complications associated with chemotherapy this approach on last and certainly the regimen would be more tolerated. Carboplatin can be utilized at the setting as a radiosensitizer with minimal toxicity. These toxicity would include nausea, vomiting and mild myelosuppression. Peripheral neuropathy is less likely as well as renal dysfunction is less of a problem.  After discussion today he is agreeable to proceed with this approach or surgery. I will make the appropriate referral for radiation oncology anticipate starting carboplatin AUC of 2 on a weekly basis with radiation therapy for anticipated of 6 weeks of therapy. I will schedule him chemotherapy education class and preparation.  2. IV access: Peripheral veins will be used at this time I do not see any need for a Port-A-Cath insertion at this time.  3. Antiemetics: These will be available to the patient upon the start of therapy.  4. Renal dysfunction: His creatinine is 1.16 with a creatinine clearance of more than 60 mL/m which should be adequate.  5. Follow-up: Will be determined upon the start of therapy. I anticipate I will be in the next 3-4 weeks.

## 2016-02-16 NOTE — Telephone Encounter (Signed)
Gave pt cal & avs °

## 2016-02-17 DIAGNOSIS — R2681 Unsteadiness on feet: Secondary | ICD-10-CM | POA: Diagnosis not present

## 2016-02-17 DIAGNOSIS — G231 Progressive supranuclear ophthalmoplegia [Steele-Richardson-Olszewski]: Secondary | ICD-10-CM | POA: Diagnosis not present

## 2016-02-19 ENCOUNTER — Telehealth: Payer: Self-pay

## 2016-02-19 DIAGNOSIS — R2681 Unsteadiness on feet: Secondary | ICD-10-CM | POA: Diagnosis not present

## 2016-02-19 DIAGNOSIS — G231 Progressive supranuclear ophthalmoplegia [Steele-Richardson-Olszewski]: Secondary | ICD-10-CM | POA: Diagnosis not present

## 2016-02-19 NOTE — Telephone Encounter (Signed)
-----   Message from Elenora Fender sent at 02/18/2016  4:11 PM EDT ----- Mariann Laster from [facility] called 02/18/16 about an outstanding order from 01/29/16 that needed to be signed and dated by Dr. [name]. Her # is [id number]. Thank you

## 2016-02-19 NOTE — Telephone Encounter (Signed)
Order was already scanned into system. Printed and faxed to [facility].

## 2016-02-22 DIAGNOSIS — R2681 Unsteadiness on feet: Secondary | ICD-10-CM | POA: Diagnosis not present

## 2016-02-22 DIAGNOSIS — G231 Progressive supranuclear ophthalmoplegia [Steele-Richardson-Olszewski]: Secondary | ICD-10-CM | POA: Diagnosis not present

## 2016-02-24 ENCOUNTER — Encounter: Payer: Self-pay | Admitting: *Deleted

## 2016-02-24 ENCOUNTER — Other Ambulatory Visit: Payer: Medicare Other

## 2016-02-25 ENCOUNTER — Ambulatory Visit (INDEPENDENT_AMBULATORY_CARE_PROVIDER_SITE_OTHER): Payer: Medicare Other | Admitting: Neurology

## 2016-02-25 DIAGNOSIS — G609 Hereditary and idiopathic neuropathy, unspecified: Secondary | ICD-10-CM

## 2016-02-25 DIAGNOSIS — G5602 Carpal tunnel syndrome, left upper limb: Secondary | ICD-10-CM

## 2016-02-25 NOTE — Procedures (Signed)
[facility]  [place]  [place], [place] [address] Tel: [phone] Fax:  [phone] Test Date:  02/25/2016  Patient: Brian Montgomery DOB: [birth year] Physician: Narda Amber, DO  Sex: Male Height: 5\' 5"  Ref Phys: Metta Clines, M.D.  ID#: [id number] Temp: 33.0C Technician: Jerilynn Mages. Dean   Patient Complaints: This is a 80 year old gentleman referred for evaluation of left hand numbness and falls.  NCV & EMG Findings: Extensive electrodiagnostic testing of the left upper and lower extremities and additional studies of the right lower extremity shows: 1. Left median sensory response shows reduced amplitude and normal latency. Left ulnar sensory response shows prolonged latency and normal amplitude. Mixed palmar studies show prolonged peak latency of both ulnar and median nerves. 2. Left median motor response shows reduced amplitude, however, there is evidence of anomalous innervation to the abductor pollicis brevis as noted by a motor response when stimulating at the ulnar wrist, consistent with a Martin-Gruber anastomosis. Left ulnar motor responses within normal limits. 3. Left sural and superficial peroneal sensory responses are within normal limits. 4. Left tibial motor response is markedly reduced. Left extensor digitorum brevis is within normal limits. 5. Left tibial H reflex study is prolonged. Right tibial reflex study is within normal limits. 6. In the left upper extremity, active on chronic motor axon loss changes are seen affecting the abductor pollicis brevis muscle. 7. In the lower extremities, chronic motor axon loss changes are seen affecting the L5-S1 myotomes bilaterally, without accompanied active denervation.  Impression: 1. Subacute left median neuropathy at or distal to the wrist, consistent with clinical diagnosis of carpal tunnel syndrome.  Overall, these findings are moderate in degree electrically. 2. Chronic L5-S1 radiculopathy affecting bilateral  lower extremities, moderate in degree electrically. 3. Left ulnar sensory response shows prolonged latency, in isolation, these findings are unclear clinical significance.  If patient develops worsening paresthesias, repeat electrodiagnostic testing in 3-6 months is recommended. 4. Incidentally, there is a left Martin-Gruber anastomosis, a normal variant. 5. There is no evidence of a diffuse myopathy or generalized sensorimotor polyneuropathy.    ___________________________ Narda Amber, DO    Nerve Conduction Studies Anti Sensory Summary Table   Stim Site NR Peak (ms) Norm Peak (ms) P-T Amp (V) Norm P-T Amp  Left Median Anti Sensory (2nd Digit)  33C  Wrist    3.4 <3.8 7.4 >10  Left Sup Peroneal Anti Sensory (Ant Lat Mall)  33C  12 cm    3.6 <4.6 4.6 >3  Left Sural Anti Sensory (Lat Mall)  33C  Calf    3.6 <4.6 5.3 >3  Left Ulnar Anti Sensory (5th Digit)  33C  Wrist    3.6 <3.2 10.7 >5   Motor Summary Table   Stim Site NR Onset (ms) Norm Onset (ms) O-P Amp (mV) Norm O-P Amp Site1 Site2 Delta-0 (ms) Dist (cm) Vel (m/s) Norm Vel (m/s)  Left Median Motor (Abd Poll Brev)  33C  Wrist    2.9 <4.0 3.8 >5 Elbow Wrist 5.1 28.0 55 >50  Elbow    8.0  3.5  Ulnar-wrist crossover Elbow 2.9 0.0    Ulnar-wrist crossover    5.1  2.7         Left Peroneal Motor (Ext Dig Brev)  33C  Ankle    3.9 <6.0 5.2 >2.5 B Fib Ankle 6.9 32.0 46 >40  B Fib    10.8  4.5  Poplt B Fib 2.1 10.0 48 >40  Poplt    12.9  4.3         Left Tibial Motor (Abd Hall Brev)  33C  Ankle    4.1 <6.0 0.4 >4 Knee Ankle 9.4 40.0 43 >40  Knee    13.5  0.4         Left Ulnar Motor (Abd Dig Minimi)  33C  Wrist    3.0 <3.1 10.5 >7 B Elbow Wrist 3.6 20.0 56 >50  B Elbow    6.6  10.0  A Elbow B Elbow 1.7 10.0 59 >50  A Elbow    8.3  9.8          Comparison Summary Table   Stim Site NR Peak (ms) Norm Peak (ms) P-T Amp (V) Site1 Site2 Delta-P (ms) Norm Delta (ms)  Left Median/Ulnar Palm Comparison (Wrist - 8cm)  33C    Median Palm    2.2 <2.2 15.1 Median Palm Ulnar Palm 0.4   Ulnar Palm    2.6 <2.2 8.3       H Reflex Studies   NR H-Lat (ms) Lat Norm (ms) L-R H-Lat (ms) M-Lat (ms) HLat-MLat (ms)  Left Tibial (Gastroc)  33C     37.14 <35 4.08 3.67 33.47  Right Tibial (Gastroc)  33C     33.06 <35 4.08 3.67 29.39   EMG   Side Muscle Ins Act Fibs Psw Fasc Number Recrt Dur Dur. Amp Amp. Poly Poly. Comment  Right Gastroc Nml Nml Nml Nml 2- Rapid Many 1+ Some 1+ Nml Nml N/A  Right AntTibialis Nml Nml Nml Nml 2- Rapid Many 1+ Many 1+ Nml Nml N/A  Right RectFemoris Nml Nml Nml Nml Nml Nml Nml Nml Nml Nml Nml Nml N/A  Left AntTibialis Nml Nml Nml Nml 2- Rapid Many 1+ Many 1+ Some 1+ N/A  Left PronatorTeres Nml Nml Nml Nml Nml Nml Nml Nml Nml Nml Nml Nml N/A  Left 1stDorInt Nml Nml Nml Nml Nml Nml Nml Nml Nml Nml Nml Nml N/A  Left Abd Poll Brev Nml 1+ Nml Nml 1- Mod-R Some 1+ Some 1+ Nml Nml N/A  Left Ext Indicis Nml Nml Nml Nml Nml Nml Nml Nml Nml Nml Nml Nml N/A  Left FlexPolLong Nml Nml Nml Nml Nml Nml Nml Nml Nml Nml Nml Nml N/A  Left Gastroc Nml Nml Nml Nml 2- Rapid Many 1+ Some 1+ Nml Nml N/A  Left Flex Dig Long Nml Nml Nml Nml 2- Rapid Many 1+ Many 1+ Nml Nml N/A  Left RectFemoris Nml Nml Nml Nml Nml Nml Nml Nml Nml Nml Nml Nml N/A  Left GluteusMed Nml Nml Nml Nml 1- Rapid Many 1+ Many 1+ Nml Nml N/A  Left BicepsFemS Nml Nml Nml Nml 1- Rapid Some 1+ Some 1+ Nml Nml N/A  Left Biceps Nml Nml Nml Nml Nml Nml Nml Nml Nml Nml Nml Nml N/A  Left Triceps Nml Nml Nml Nml Nml Nml Nml Nml Nml Nml Nml Nml N/A  Left Deltoid Nml Nml Nml Nml Nml Nml Nml Nml Nml Nml Nml Nml N/A  Left ABD Dig Min Nml Nml Nml Nml Nml Nml Nml Nml Nml Nml Nml Nml N/A  Left FlexDigProf 4,5 Nml Nml Nml Nml Nml Nml Nml Nml Nml Nml Nml Nml N/A      Waveforms:

## 2016-02-27 ENCOUNTER — Other Ambulatory Visit: Payer: Self-pay | Admitting: Family Medicine

## 2016-02-29 DIAGNOSIS — R2681 Unsteadiness on feet: Secondary | ICD-10-CM | POA: Diagnosis not present

## 2016-02-29 DIAGNOSIS — G231 Progressive supranuclear ophthalmoplegia [Steele-Richardson-Olszewski]: Secondary | ICD-10-CM | POA: Diagnosis not present

## 2016-03-01 ENCOUNTER — Telehealth: Payer: Self-pay

## 2016-03-01 ENCOUNTER — Telehealth: Payer: Self-pay | Admitting: Radiation Oncology

## 2016-03-01 ENCOUNTER — Telehealth: Payer: Self-pay | Admitting: Neurology

## 2016-03-01 DIAGNOSIS — R29898 Other symptoms and signs involving the musculoskeletal system: Secondary | ICD-10-CM

## 2016-03-01 NOTE — Addendum Note (Signed)
Addended by: Gerda Diss A on: 03/01/2016 10:27 AM   Modules accepted: Orders

## 2016-03-01 NOTE — Telephone Encounter (Signed)
Spoke with patient, wife, and daughter. Message relayed. Verbalized understanding and denied questions. OT referral placed and staff message sent to [facility]. Daughter did ask if you felt they should return to Dr. Frederik Pear @  [facility] (([place] [address]  Office: [phone]  Fax: [phone])). Daughter stated that he had also felt it was carpal tunnel, but did stated it was a mild case and would prefer for you to evaluate before treating due to his neurological conditions as well. Please advise.

## 2016-03-01 NOTE — Telephone Encounter (Signed)
Left message on machine for pt to return call to the office.  

## 2016-03-01 NOTE — Telephone Encounter (Signed)
I called and left a message to speak with the patient's daughter about her father's upcoming appointment.

## 2016-03-01 NOTE — Telephone Encounter (Signed)
-----   Message from Pieter Partridge, DO sent at 03/01/2016  7:09 AM EDT ----- Nerve study does show signs of carpal tunnel syndrome, however the results aren't definitive.  I would like him to undergo occupational therapy of the left hand/upper extremity.  We can repeat nerve study in 3 to 6 months if symptoms get worse or not improved.

## 2016-03-01 NOTE — Telephone Encounter (Signed)
It is up to her but I would continue conservative management rather than any surgical procedure at this point

## 2016-03-01 NOTE — Telephone Encounter (Signed)
Brian Montgomery [birth year]. His daughter called wanting to let you know that they have decided to do Physical Therapy instead of surgery. Thank you

## 2016-03-02 DIAGNOSIS — R2681 Unsteadiness on feet: Secondary | ICD-10-CM | POA: Diagnosis not present

## 2016-03-02 DIAGNOSIS — G231 Progressive supranuclear ophthalmoplegia [Steele-Richardson-Olszewski]: Secondary | ICD-10-CM | POA: Diagnosis not present

## 2016-03-03 ENCOUNTER — Telehealth: Payer: Self-pay

## 2016-03-03 ENCOUNTER — Ambulatory Visit: Payer: Medicare Other | Admitting: Neurology

## 2016-03-03 NOTE — Telephone Encounter (Signed)
He should be working with one OT, so I would go with whatever Mr. Brian Montgomery recommends.

## 2016-03-03 NOTE — Telephone Encounter (Signed)
Spoke with Timmothy Sours. Verbal OT orders given. Will discuss whether or not family wants RX for splints to try to get some help with cover

## 2016-03-03 NOTE — Telephone Encounter (Signed)
Received fax from after hours call nurse. Fax states:  OT, Dollene Cleveland [phone], is working with pt. Pt was evaluated today. OT would like to work with patient 1 x a week for 3 weeks, for pt and caregiver therapetuic exercises. He would like for pt to have wrist brace at night to use. Caregiver and pt were trained on orthotic training. Pt has a previous established exercise restraining pro in place for his hand. Do you want to discontinue this and just provide ROM and stretching exercises?    Please advise.

## 2016-03-07 ENCOUNTER — Telehealth: Payer: Self-pay | Admitting: Neurology

## 2016-03-07 DIAGNOSIS — R2681 Unsteadiness on feet: Secondary | ICD-10-CM | POA: Diagnosis not present

## 2016-03-07 DIAGNOSIS — R269 Unspecified abnormalities of gait and mobility: Secondary | ICD-10-CM

## 2016-03-07 DIAGNOSIS — G231 Progressive supranuclear ophthalmoplegia [Steele-Richardson-Olszewski]: Secondary | ICD-10-CM | POA: Diagnosis not present

## 2016-03-07 NOTE — Telephone Encounter (Signed)
Order placed and faxed to [facility]

## 2016-03-07 NOTE — Telephone Encounter (Signed)
-----   Message from [place], DO sent at 03/07/2016 12:16 PM EDT ----- Please provide.  Thx ----- Message ----- From: Sherlynn Carbon Sent: 03/07/2016  12:11 PM To: Pieter Partridge, DO, Amada Kingfisher, CMA  Mel with [facility] called and wanted a prescription called in for a walker for PT/Dawn CB# [phone]

## 2016-03-08 DIAGNOSIS — G231 Progressive supranuclear ophthalmoplegia [Steele-Richardson-Olszewski]: Secondary | ICD-10-CM | POA: Diagnosis not present

## 2016-03-08 DIAGNOSIS — R2681 Unsteadiness on feet: Secondary | ICD-10-CM | POA: Diagnosis not present

## 2016-03-09 ENCOUNTER — Other Ambulatory Visit: Payer: Self-pay | Admitting: Neurology

## 2016-03-09 ENCOUNTER — Other Ambulatory Visit: Payer: Self-pay | Admitting: Urology

## 2016-03-09 NOTE — Telephone Encounter (Signed)
Rx sent 

## 2016-03-15 ENCOUNTER — Telehealth: Payer: Self-pay

## 2016-03-15 ENCOUNTER — Other Ambulatory Visit (HOSPITAL_BASED_OUTPATIENT_CLINIC_OR_DEPARTMENT_OTHER): Payer: Medicare Other

## 2016-03-15 ENCOUNTER — Ambulatory Visit (HOSPITAL_BASED_OUTPATIENT_CLINIC_OR_DEPARTMENT_OTHER): Payer: Medicare Other | Admitting: Oncology

## 2016-03-15 ENCOUNTER — Telehealth: Payer: Self-pay | Admitting: Oncology

## 2016-03-15 ENCOUNTER — Encounter: Payer: Self-pay | Admitting: Radiation Oncology

## 2016-03-15 VITALS — BP 104/62 | HR 75 | Temp 97.7°F | Resp 16 | Wt 175.1 lb

## 2016-03-15 DIAGNOSIS — C679 Malignant neoplasm of bladder, unspecified: Secondary | ICD-10-CM | POA: Diagnosis not present

## 2016-03-15 DIAGNOSIS — N289 Disorder of kidney and ureter, unspecified: Secondary | ICD-10-CM | POA: Diagnosis not present

## 2016-03-15 LAB — CBC WITH DIFFERENTIAL/PLATELET
BASO%: 0.7 % (ref 0.0–2.0)
BASOS ABS: 0.1 10*3/uL (ref 0.0–0.1)
EOS%: 2.1 % (ref 0.0–7.0)
Eosinophils Absolute: 0.1 10*3/uL (ref 0.0–0.5)
HCT: 39 % (ref 38.4–49.9)
HGB: 13 g/dL (ref 13.0–17.1)
LYMPH#: 1.2 10*3/uL (ref 0.9–3.3)
LYMPH%: 17.2 % (ref 14.0–49.0)
MCH: 29.4 pg (ref 27.2–33.4)
MCHC: 33.3 g/dL (ref 32.0–36.0)
MCV: 88.3 fL (ref 79.3–98.0)
MONO#: 0.5 10*3/uL (ref 0.1–0.9)
MONO%: 6.4 % (ref 0.0–14.0)
NEUT#: 5.3 10*3/uL (ref 1.5–6.5)
NEUT%: 73.6 % (ref 39.0–75.0)
Platelets: 231 10*3/uL (ref 140–400)
RBC: 4.42 10*6/uL (ref 4.20–5.82)
RDW: 13.5 % (ref 11.0–14.6)
WBC: 7.1 10*3/uL (ref 4.0–10.3)

## 2016-03-15 LAB — COMPREHENSIVE METABOLIC PANEL
AST: 16 U/L (ref 5–34)
Albumin: 3.8 g/dL (ref 3.5–5.0)
Alkaline Phosphatase: 80 U/L (ref 40–150)
Anion Gap: 11 mEq/L (ref 3–11)
BUN: 26.7 mg/dL — AB (ref 7.0–26.0)
CHLORIDE: 107 meq/L (ref 98–109)
CO2: 23 meq/L (ref 22–29)
CREATININE: 1.4 mg/dL — AB (ref 0.7–1.3)
Calcium: 10.1 mg/dL (ref 8.4–10.4)
EGFR: 54 mL/min/{1.73_m2} — ABNORMAL LOW (ref >90)
GLUCOSE: 81 mg/dL (ref 70–140)
Potassium: 3.6 mEq/L (ref 3.5–5.1)
SODIUM: 141 meq/L (ref 136–145)
Total Bilirubin: 0.57 mg/dL (ref 0.20–1.20)
Total Protein: 7.8 g/dL (ref 6.4–8.3)

## 2016-03-15 MED ORDER — PROCHLORPERAZINE MALEATE 10 MG PO TABS: 10.0000 mg | ORAL_TABLET | Freq: Four times a day (QID) | ORAL | 0 refills | Status: DC | PRN

## 2016-03-15 NOTE — Telephone Encounter (Signed)
-----   Message from [facility] sent at 03/07/2016 12:11 PM EDT ----- Mel with [facility] called and wanted a prescription called in for a walker for PT/Dawn CB# [phone]

## 2016-03-15 NOTE — Telephone Encounter (Signed)
Gave relative avs report and appointment for September and October

## 2016-03-15 NOTE — Progress Notes (Signed)
GU Location of Tumor / Histology: infiltrative high grade papillary urothelial carcinoma which focally invades in the muscularis propria  Brian Montgomery developed hematuria. Originally presented to his PCP around May who prescribed antibiotics in an attempt to manage hematuria. Unfortunately, hematuria became worse with clot and he presented to the emergency room in July.He was evaluated by Dr. Alinda Money and underwent cystoscopy. A ridge bladder tumor measuring 5 cm was noted thus, a transurethral resection was performed.    Past/Anticipated interventions by urology, if any: cystoscopy and resection of tumor. Scheduled for second surgery on 03/22/16  Past/Anticipated interventions by medical oncology, if any:  Anticipate starting carboplatin AUC of 2 on a weekly basis with radiation therapy for anticipated 6 weeks of therapy.  Weight changes, if any: no  Bowel/Bladder complaints, if any: hematuria resolved following initial cysto, reports nocturia x 2, reports occasional incontinence stating sometimes when I stand "it just come out."  Nausea/Vomiting, if any: no  Pain issues, if any:  Left shoulder pain related to fall in May. Explains he completed physical therapy yesterday.   SAFETY ISSUES:  Prior radiation? no  Pacemaker/ICD? no  Possible current pregnancy? no  Is the patient on methotrexate? no  Current Complaints / other details:  80 year old male. Married with two daughter. Daughter reports her father suffers from super nuclear palsy.

## 2016-03-15 NOTE — Progress Notes (Signed)
Hematology and Oncology Follow Up Visit  Brian Montgomery [id number] 1936/02/05 80 y.o. 03/15/2016 10:11 AM Brian Montgomery, MDLalonde, Elyse Jarvis, MD   Principle Diagnosis: 80 year old with muscle invasive high-grade papillary urothelial carcinoma diagnosed in July 2017. He had a T2 N0 disease presenting with a 6 cm mass on 01/21/2016.   Prior Therapy: Status post TURBT on 01/21/2016.  Current therapy: Under evaluation for definitive therapy utilizing radiation therapy and carboplatin.  Interim History: Brian Montgomery presents today for a follow-up visit. The pleasant gentleman I saw in consultation on 02/16/2016. Since his last discharge, he has been evaluated with a possible repeat TURBT and scheduled to meet with Dr. Tammi Klippel in the near future to discuss radiation therapy. He reports feeling relatively well without any recent complaints. He attended chemotherapy education class and ready to proceed with therapy. He denied any hematuria or dysuria. He denied any further decline in his performance status or activity level.  He does not report any headaches, blurry vision, double vision, syncope or seizures. He does not report any fevers, chills or sweats. He does not report any cough, wheezing or hemoptysis. He does not report any nausea, vomiting or abdominal pain. He does not report any frequency, hesitancy or urgency. He does not report any skeletal complaints. Remaining review of systems unremarkable.    Medications: I have reviewed the patient's current medications.  Current Outpatient Prescriptions  Medication Sig Dispense Refill  . carbidopa-levodopa (SINEMET IR) 25-100 MG tablet TAKE 1/2 TABLET BY MOUTH TWICE DAILY FOR 3 DAYS, 1/2 TABLET THREE TIMES DAILY FOR 3 DAYS, THEN 1 TABLET THREE TIMES DAILY 90 tablet 3  . donepezil (ARICEPT) 5 MG tablet TAKE 1 TABLET(5 MG) BY MOUTH AT BEDTIME 90 tablet 0  . HYDROcodone-acetaminophen (NORCO/VICODIN) 5-325 MG tablet Take 1-2 tablets by mouth every 6  (six) hours as needed. (Patient taking differently: Take 1-2 tablets by mouth every 6 (six) hours as needed for moderate pain. ) 25 tablet 0  . lisinopril-hydrochlorothiazide (PRINZIDE,ZESTORETIC) 10-12.5 MG tablet Take 1 tablet by mouth daily. 90 tablet 3  . prochlorperazine (COMPAZINE) 10 MG tablet Take 1 tablet (10 mg total) by mouth every 6 (six) hours as needed for nausea or vomiting. 30 tablet 0  . simvastatin (ZOCOR) 20 MG tablet TAKE 1 TABLET BY MOUTH DAILY 90 tablet 0   No current facility-administered medications for this visit.      Allergies: No Known Allergies  Past Medical History, Surgical history, Social history, and Family History were reviewed and updated.   Physical Exam: Blood pressure 104/62, pulse 75, temperature 97.7 F (36.5 C), temperature source Oral, resp. rate 16, weight 175 lb 1.6 oz (79.4 kg), SpO2 100 %. ECOG: 1 General appearance: alert and cooperative appeared without distress. Head: Normocephalic, without obvious abnormality Neck: no adenopathy Lymph nodes: Cervical, supraclavicular, and axillary nodes normal. Heart:regular rate and rhythm, S1, S2 normal, no murmur, click, rub or gallop Lung:chest clear, no wheezing, rales, normal symmetric air entry.  Abdomin: soft, non-tender, without masses or organomegaly. No rebound or guarding. EXT:no erythema, induration, or nodules   Lab Results: Lab Results  Component Value Date   WBC 7.1 03/15/2016   HGB 13.0 03/15/2016   HCT 39.0 03/15/2016   MCV 88.3 03/15/2016   PLT 231 03/15/2016     Chemistry      Component Value Date/Time   NA 135 01/22/2016 0446   K 4.5 01/22/2016 0446   CL 107 01/22/2016 0446   CO2 24 01/22/2016 0446   BUN 16  01/22/2016 0446   CREATININE 1.16 01/22/2016 0446   CREATININE 1.22 (H) 06/25/2015 0001      Component Value Date/Time   CALCIUM 9.2 01/22/2016 0446   ALKPHOS 62 06/25/2015 0001   AST 20 06/25/2015 0001   ALT 14 06/25/2015 0001   BILITOT 0.5 06/25/2015 0001        Impression and Plan:  80 year old gentleman with the following issues:  1. Muscle invasive high-grade papillary urothelial carcinoma of the bladder diagnosed in July 2017. He presented with hematuria and imaging studies revealed a bladder mass. He is status post TURBT on 01/21/2016 and the tumor showed it to be 6 cm mass invading into muscle wall with clinical staging at least T2 N0.  He is under consideration to start definitive therapy utilizing radiation and weekly carboplatin. He will receive repeat TURBT on September 12 with Dr. Alinda Money and we'll repeat with Dr. Tammi Klippel this week to discuss radiation.  The logistics of administration of carboplatin weekly basis was discussed. Complications include nausea, abdominal pain, vomiting, peripheral neuropathy and renal toxicity were reviewed. Cytopenias also critical at this point and complications associated with that. He is not a candidate for cisplatin therapy at this time. He is agreeable and attended chemotherapy education class.  I anticipate the start of therapy will be around the week of September 18 and will be weekly for a total of 6 weeks.  2. IV access: Peripheral veins will be adequate at this time unless his veins become poor during treatment and Port-A-Cath will be contemplated.  3. Antiemetics: Prescription for Compazine made available to the patient.  4. Renal dysfunction: His creatinine will be repeated today 2, crit appropriate dose of carboplatin and adjusted this appropriately.  5. Follow-up: Will be in 3 weeks to assess his tolerance to treatment.    [id number], MD 9/5/201710:11 AM

## 2016-03-16 ENCOUNTER — Other Ambulatory Visit: Payer: Self-pay | Admitting: *Deleted

## 2016-03-16 DIAGNOSIS — R2681 Unsteadiness on feet: Secondary | ICD-10-CM | POA: Diagnosis not present

## 2016-03-16 DIAGNOSIS — G231 Progressive supranuclear ophthalmoplegia [Steele-Richardson-Olszewski]: Secondary | ICD-10-CM | POA: Diagnosis not present

## 2016-03-16 MED ORDER — PROCHLORPERAZINE MALEATE 10 MG PO TABS: 10.0000 mg | ORAL_TABLET | Freq: Four times a day (QID) | ORAL | 0 refills | Status: DC | PRN

## 2016-03-17 ENCOUNTER — Encounter (HOSPITAL_COMMUNITY)
Admission: RE | Admit: 2016-03-17 | Discharge: 2016-03-17 | Disposition: A | Discharge status: Home / Self Care | Payer: Medicare Other | Source: Ambulatory Visit | Attending: Urology | Admitting: Urology

## 2016-03-17 ENCOUNTER — Encounter (HOSPITAL_COMMUNITY): Payer: Self-pay

## 2016-03-17 ENCOUNTER — Ambulatory Visit
Admission: RE | Admit: 2016-03-17 | Discharge: 2016-03-17 | Disposition: A | Discharge status: Home / Self Care | Payer: Medicare Other | Source: Ambulatory Visit | Attending: Radiation Oncology | Admitting: Radiation Oncology

## 2016-03-17 ENCOUNTER — Encounter: Payer: Self-pay | Admitting: Radiation Oncology

## 2016-03-17 ENCOUNTER — Other Ambulatory Visit: Payer: Self-pay | Admitting: Urology

## 2016-03-17 ENCOUNTER — Institutional Professional Consult (permissible substitution): Payer: Medicare Other | Admitting: Radiation Oncology

## 2016-03-17 ENCOUNTER — Other Ambulatory Visit (HOSPITAL_COMMUNITY): Payer: Self-pay | Admitting: Urology

## 2016-03-17 DIAGNOSIS — C672 Malignant neoplasm of lateral wall of bladder: Secondary | ICD-10-CM | POA: Diagnosis not present

## 2016-03-17 DIAGNOSIS — N402 Nodular prostate without lower urinary tract symptoms: Secondary | ICD-10-CM | POA: Diagnosis not present

## 2016-03-17 DIAGNOSIS — I10 Essential (primary) hypertension: Secondary | ICD-10-CM | POA: Insufficient documentation

## 2016-03-17 DIAGNOSIS — C679 Malignant neoplasm of bladder, unspecified: Secondary | ICD-10-CM

## 2016-03-17 DIAGNOSIS — M1990 Unspecified osteoarthritis, unspecified site: Secondary | ICD-10-CM | POA: Insufficient documentation

## 2016-03-17 DIAGNOSIS — Z01812 Encounter for preprocedural laboratory examination: Secondary | ICD-10-CM | POA: Diagnosis not present

## 2016-03-17 DIAGNOSIS — Z87891 Personal history of nicotine dependence: Secondary | ICD-10-CM | POA: Insufficient documentation

## 2016-03-17 DIAGNOSIS — E785 Hyperlipidemia, unspecified: Secondary | ICD-10-CM | POA: Insufficient documentation

## 2016-03-17 DIAGNOSIS — Z510 Encounter for antineoplastic radiation therapy: Secondary | ICD-10-CM | POA: Diagnosis not present

## 2016-03-17 DIAGNOSIS — N529 Male erectile dysfunction, unspecified: Secondary | ICD-10-CM | POA: Diagnosis not present

## 2016-03-17 HISTORY — DX: Malignant neoplasm of bladder, unspecified: C67.9

## 2016-03-17 HISTORY — DX: Paralytic syndrome, unspecified: G83.9

## 2016-03-17 LAB — BASIC METABOLIC PANEL
Anion gap: 7 (ref 5–15)
BUN: 26 mg/dL — AB (ref 6–20)
CALCIUM: 9.9 mg/dL (ref 8.9–10.3)
CO2: 26 mmol/L (ref 22–32)
CREATININE: 1.27 mg/dL — AB (ref 0.61–1.24)
Chloride: 104 mmol/L (ref 101–111)
GFR calc Af Amer: >60 mL/min (ref >60)
GFR, EST NON AFRICAN AMERICAN: 52 mL/min — AB (ref >60)
GLUCOSE: 69 mg/dL (ref 65–99)
Potassium: 4.3 mmol/L (ref 3.5–5.1)
SODIUM: 137 mmol/L (ref 135–145)

## 2016-03-17 LAB — PSA: PSA: 9.63 ng/mL — AB (ref 0.00–4.00)

## 2016-03-17 NOTE — Patient Instructions (Signed)
Brian Montgomery  03/17/2016   Your procedure is scheduled on: Tuesday March 22, 2016  Report to [facility] Main  Entrance take [place]  elevators to 3rd floor to  [place] at 11:30 AM.  Call this number if you have problems the morning of surgery [phone]   Remember: ONLY 1 PERSON MAY GO WITH YOU TO SHORT STAY TO GET  READY MORNING OF [place].  Do not eat food After Midnight but may take clear liquids till 7:30 am day of surgery then nothing by mouth.      Take these medicines the morning of surgery with A SIP OF WATER: Carbidopa-Levodopa; May take Hydrocodone-Acetaminophen if needed                                 You may not have any metal on your body including hair pins and              piercings  Do not wear jewelry,  lotions, powders or colognes, deodorant                          Men may shave face and neck.   Do not bring valuables to the hospital. [place].  Contacts, dentures or bridgework may not be worn into surgery.      Patients discharged the day of surgery will not be allowed to drive home.  Name and phone number of your driver:Kimberly Zenaida Niece (daughter) and Treon Kiracofe (wife)  _____________________________________________________________________             [facility] - Preparing for Surgery Before surgery, you can play an important role.  Because skin is not sterile, your skin needs to be as free of germs as possible.  You can reduce the number of germs on your skin by washing with CHG (chlorahexidine gluconate) soap before surgery.  CHG is an antiseptic cleaner which kills germs and bonds with the skin to continue killing germs even after washing. Please DO NOT use if you have an allergy to CHG or antibacterial soaps.  If your skin becomes reddened/irritated stop using the CHG and inform your nurse when you arrive at Short Stay. Do not shave (including legs and underarms)  for at least 48 hours prior to the first CHG shower.  You may shave your face/neck. Please follow these instructions carefully:  1.  Shower with CHG Soap the night before surgery and the  morning of Surgery.  2.  If you choose to wash your hair, wash your hair first as usual with your  normal  shampoo.  3.  After you shampoo, rinse your hair and body thoroughly to remove the  shampoo.                           4.  Use CHG as you would any other liquid soap.  You can apply chg directly  to the skin and wash                       Gently with a scrungie or clean washcloth.  5.  Apply the CHG Soap to your body  ONLY FROM THE NECK DOWN.   Do not use on face/ open                           Wound or open sores. Avoid contact with eyes, ears mouth and genitals (private parts).                       Wash face,  Genitals (private parts) with your normal soap.             6.  Wash thoroughly, paying special attention to the area where your surgery  will be performed.  7.  Thoroughly rinse your body with warm water from the neck down.  8.  DO NOT shower/wash with your normal soap after using and rinsing off  the CHG Soap.                9.  Pat yourself dry with a clean towel.            10.  Wear clean pajamas.            11.  Place clean sheets on your bed the night of your first shower and do not  sleep with pets. Day of Surgery : Do not apply any lotions/deodorants the morning of surgery.  Please wear clean clothes to the hospital/surgery center.  FAILURE TO FOLLOW THESE INSTRUCTIONS MAY RESULT IN THE CANCELLATION OF YOUR SURGERY PATIENT SIGNATURE_________________________________  NURSE SIGNATURE__________________________________  ________________________________________________________________________    CLEAR LIQUID DIET   Foods Allowed                                                                     Foods Excluded  Coffee and tea, regular and decaf                             liquids  that you cannot  Plain Jell-O in any flavor                                             see through such as: Fruit ices (not with fruit pulp)                                     milk, soups, orange juice  Iced Popsicles                                    All solid food Carbonated beverages, regular and diet                                    Cranberry, grape and apple juices Sports drinks like Gatorade Lightly seasoned clear broth or consume(fat free) Sugar, honey syrup  Sample Menu Breakfast  Lunch                                     Supper Cranberry juice                    Beef broth                            Chicken broth Jell-O                                     Grape juice                           Apple juice Coffee or tea                        Jell-O                                      Popsicle                                                Coffee or tea                        Coffee or tea  _____________________________________________________________________

## 2016-03-17 NOTE — Progress Notes (Signed)
[place]         [phone] ________________________________  Initial outpatient Consultation  Name: Brian Montgomery MRN: [id number]  Date: 03/17/2016  DOB: 09-17-1935  REFERRING PHYSICIAN: Wyatt Portela, MD  DIAGNOSIS: There were no encounter diagnoses.  No diagnosis found.  HISTORY OF PRESENT ILLNESS::Brian Montgomery is a 80 y.o. male who developed hematuria. Originally present to his PCP around May who prescribed antibiotics in an attempt to manage hematuria. Unfortunately, hematuria became worse with clot and he presented to the emergency room on 01/17/2016. He was evaluated by Dr. Alinda Money and underwent cystoscopy on 01/21/2016. A ridge bladder tumor measuring 5 cm was noted thus, a transurethral resection was performed. The patient is scheduled for cystoscopy with TURBT and resection of tumor on 03/22/2016 with Dr. Alinda Money.  The patient is here to discuss radiation treatment in the management of his disease. Of note, a prostate nodule was palpated by Dr. Alinda Money in May. This was not investigated further in lieu of bladder cancer concerns. He had an outside PSA drawn but this is not available for review.  PREVIOUS RADIATION THERAPY: No  Past Medical History:  Diagnosis Date  . Arthritis   . Bladder cancer ([place])    infiltrative high grade papillary urothelial carcinoma   . Dyslipidemia   . ED (erectile dysfunction)   . Hypertension   . 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 BACK SURGERY    . COLONOSCOPY  2007   Dr.[email or web address]  . CYSTOSCOPY W/ RETROGRADES N/A 01/21/2016   Procedure: CYSTOSCOPY WITH TURBT WITH  CLOT EVACUATION;  Surgeon: Raynelle Bring, MD;  Location: [facility];  Service: Urology;  Laterality: N/A;  . left total knee  2011  :   Current Outpatient Prescriptions:  .  carbidopa-levodopa (SINEMET IR) 25-100 MG tablet, TAKE 1/2 TABLET BY MOUTH TWICE DAILY FOR 3 DAYS, 1/2 TABLET THREE TIMES  DAILY FOR 3 DAYS, THEN 1 TABLET THREE TIMES DAILY, Disp: 90 tablet, Rfl: 3 .  donepezil (ARICEPT) 5 MG tablet, TAKE 1 TABLET(5 MG) BY MOUTH AT BEDTIME, Disp: 90 tablet, Rfl: 0 .  HYDROcodone-acetaminophen (NORCO/VICODIN) 5-325 MG tablet, Take 1-2 tablets by mouth every 6 (six) hours as needed. (Patient taking differently: Take 1-2 tablets by mouth every 6 (six) hours as needed for moderate pain. ), Disp: 25 tablet, Rfl: 0 .  lisinopril-hydrochlorothiazide (PRINZIDE,ZESTORETIC) 10-12.5 MG tablet, Take 1 tablet by mouth daily., Disp: 90 tablet, Rfl: 3 .  simvastatin (ZOCOR) 20 MG tablet, TAKE 1 TABLET BY MOUTH DAILY, Disp: 90 tablet, Rfl: 0 .  prochlorperazine (COMPAZINE) 10 MG tablet, Take 1 tablet (10 mg total) by mouth every 6 (six) hours as needed for nausea or vomiting. (Patient not taking: Reported on 03/17/2016), Disp: 30 tablet, Rfl: 0:  No Known Allergies:  Family History  Problem Relation Age of Onset  . Heart failure Mother   . Hypertension Mother   . Heart failure Father   . Hypertension Father   . Renal Disease Sister   . Diabetes Brother   :  Social History   Social History  . Marital status: Married    Spouse name: N/A  . Number of children: N/A  . Years of education: N/A   Occupational History  . Not on file.   Social History Main Topics  . Smoking status: Former Smoker    Packs/day: 0.25    Years: 5.00  Types: Cigarettes    Quit date: 07/11/1986  . Smokeless tobacco: Never Used  . Alcohol use 0.0 oz/week     Comment: occasional beer or wine   . Drug use: No  . Sexual activity: Yes    Partners: Female   Other Topics Concern  . Not on file   Social History Narrative  . No narrative on file  :  REVIEW OF SYSTEMS:  On review of systems, the patient reports that he is doing well overall. He denies any chest pain, shortness of breath, fevers, chills, night sweats, unintended weight changes. His wife reports he has a cough and runny nose. He denies abdominal  pain, nausea or vomiting. He was a little constipated yesterday but was afraid of taking something before his appointment today. He reports nocturia x 2 and occasional incontinence stating sometimes when I stand "it just comes out". His prior hematuria resolved following initial cystoscopy. He denies any new musculoskeletal or joint aches or pains. He reports left shoulder pain related to fall in May. Daughter reports her father suffers from super nuclear palsy. He completed physical therapy yesterday. A complete review of systems is obtained and is otherwise negative.   PHYSICAL EXAM:  Blood pressure 115/61, pulse 75, resp. rate 16, weight 175 lb (79.4 kg), SpO2 100 %. In general this is a well appearing African American male in no acute distress who presents in wheelchair. He is alert and oriented x4 and appropriate throughout the examination. HEENT reveals that the patient is normocephalic, atraumatic. EOMs are intact. PERRLA. Skin is intact without any evidence of gross lesions. Cardiovascular exam reveals a regular rate and rhythm, no clicks rubs or murmurs are auscultated. Chest is clear to auscultation bilaterally. Lymphatic assessment is performed and does not reveal any adenopathy in the cervical, supraclavicular, axillary, or inguinal chains. Abdomen has active bowel sounds in all quadrants and is intact. The abdomen is soft, non tender, non distended. Lower extremities are negative for pretibial pitting edema, deep calf tenderness, cyanosis or clubbing.   KPS = 80  100 - Normal; no complaints; no evidence of disease. 90   - Able to carry on normal activity; minor signs or symptoms of disease. 80   - Normal activity with effort; some signs or symptoms of disease. 34   - Cares for self; unable to carry on normal activity or to do active work. 60   - Requires occasional assistance, but is able to care for most of his personal needs. 50   - Requires considerable assistance and frequent medical  care. 65   - Disabled; requires special care and assistance. 41   - Severely disabled; hospital admission is indicated although death not imminent. 36   - Very sick; hospital admission necessary; active supportive treatment necessary. 10   - Moribund; fatal processes progressing rapidly. 0     - Dead  Karnofsky DA, Abelmann [facility], Craver LS and Burchenal [facility] [phone]) The use of the nitrogen mustards in the palliative treatment of carcinoma: with particular reference to bronchogenic carcinoma Cancer 1 [phone]  LABORATORY DATA:  Lab Results  Component Value Date   WBC 7.1 03/15/2016   HGB 13.0 03/15/2016   HCT 39.0 03/15/2016   MCV 88.3 03/15/2016   PLT 231 03/15/2016   Lab Results  Component Value Date   NA 141 03/15/2016   K 3.6 03/15/2016   CL 107 01/22/2016   CO2 23 03/15/2016   Lab Results  Component Value Date   ALT <9  03/15/2016   AST 16 03/15/2016   ALKPHOS 80 03/15/2016   BILITOT 0.57 03/15/2016     RADIOGRAPHY: No results found.    IMPRESSION: Mr. Brian Montgomery is a very nice 80 year-old gentleman with a prostate nodule, as well as a T2 urothelial carcinoma invading the muscularis.  PLAN: We met with the patient and family about the findings and work-up thus far.  We discussed the natural history of T2 bladder cancer and general treatment, highlighting the role of radiotherapy in the management. We discussed the rationale for discussing the disposition of workup for his prostate nodule. We will contact Dr. Borden to determine if he can proceed with a prostate biopsy when he goes next Tuesday for his TURBT. I will order a PSA as well to be drawn today during his PAV with anesthesia. Dr. Manning discusses the treatment options including radiation if he does indeed have a prostate cancer.  We discussed the available radiation techniques, and focused on the details of logistics and delivery.  We reviewed the anticipated acute and late sequelae associated with radiation in this setting.   Dr. Manning discussed about 7 weeks of daily treatment to the bladder begin about 4 weeks after surgery. The patient would like to proceed with radiation and will be scheduled for CT simulation.  We also dicussed management of his constipation to include the use of milk of magnesia, and once his bowel start moving, switching over to Miralax.    The above documentation reflects my direct findings during this shared patient visit. Please see the separate note by Dr. Manning on this date for the remainder of the patient's plan of care.      C. , PAC      This document serves as a record of services personally performed by Matthew Manning, MD and  , PA-C. It was created on his behalf by Elizabeth Ashley, a trained medical scribe. The creation of this record is based on the scribe's personal observations and the provider's statements to them. This document has been checked and approved by the attending provider.     

## 2016-03-17 NOTE — Progress Notes (Signed)
BMP results in epic per PAT visit 03/17/2016 sent to Dr Alinda Money

## 2016-03-17 NOTE — Progress Notes (Signed)
See progress note under physician encounter. 

## 2016-03-18 ENCOUNTER — Encounter: Payer: Self-pay | Admitting: *Deleted

## 2016-03-18 DIAGNOSIS — C61 Malignant neoplasm of prostate: Secondary | ICD-10-CM | POA: Insufficient documentation

## 2016-03-21 NOTE — H&P (Signed)
CC/HPI: Muscle invasive bladder cancer    Brian Montgomery returns today in the company of his wife and his 2 daughters after his recent hospitalization and TURBT. He was initially thought to have a large clot in his bladder was taken to the operating room for cystoscopy and clot evacuation. However, during cystoscopy, he was noted to have clot on a large 6 cm bladder tumor off the right lateral wall of the bladder. He underwent transurethral resection of this bladder tumor. No pelvic mass was noted on exam under anesthesia. He follows up today after undergoing a staging CT scan of the chest, abdomen, and pelvis and to review his pathology report. He has recovered relatively well. He does have expected urinary frequency. He does not have any further hematuria.     ALLERGIES: No Allergies    MEDICATIONS: Aleve 220 MG Oral Capsule Oral  Aspir-81 81 MG Oral Tablet Delayed Release Oral  Donepezil HCl - 5 MG Oral Tablet Oral  Fish Oil Concentrate 1000 MG Oral Capsule Oral  Lisinopril-Hydrochlorothiazide 10-12.5 MG Oral Tablet Oral  Meloxicam 15 MG Oral Tablet Oral  Simvastatin 20 MG Oral Tablet Oral     GU PSH: Catheterize For Residual - 01/18/2016 Cysto Dilate Stricture (M or F) - 01/19/2016 Cystoscopy - 01/19/2016 Initial Male VB Sounds - 01/19/2016 Locm 300-399Mg /Ml Iodine,1Ml - 01/29/2016      PSH Notes: Back Surgery   NON-GU PSH: None   GU PMH: Gross hematuria - 01/18/2016, Gross hematuria, Gross hematuria - Jan 03, 2016 Nodular prostate w/o LUTS, Prostate nodule - 2016/01/03 Urinary Tract Inf, Unspec site, Urinary tract infection - 03-Jan-2016 Bladder Cancer, overlapping sites      PMH Notes:   1) Prostate nodule: He presented to me in May 2017 with a right apical/mid prostate nodule with concern for extraprostatic extension.   2) Bladder cancer: He developed gross hematuria in May 2017. This was initially thought to be related to infection but persisted and ultimately he required  catheterization. He underwent cystoscopy in the OR in July 2017 due to an inability to well visualize the bladder. He was ultimately found to have a large 6 cm bladder tumor and underwent TURBT.   Jul 2017: Cysto (no definite tumors seen), large formed clot in bladder  Jul 2017: TURBT - High grade muscle invasive urothelial carcinoma   NON-GU PMH: Personal history of other diseases of the circulatory system, History of hypertension - 01/03/2016 Personal history of other diseases of the musculoskeletal system and connective tissue, History of arthritis - Jan 03, 2016 Personal history of other diseases of the nervous system and sense organs, History of polyneuropathy - January 03, 2016 Personal history of other endocrine, nutritional and metabolic disease, History of hyperlipidemia - 2016/01/03    FAMILY HISTORY: Death of parent - Runs In Family malignant neoplasm of kidney - Runs In Family   SOCIAL HISTORY: Marital Status: Married     Notes: Married, Number of children, Retired, Former smoker, Alcohol use   REVIEW OF SYSTEMS:    GU Review Male:   Patient reports frequent urination. Patient denies hard to postpone urination, burning/ pain with urination, get up at night to urinate, leakage of urine, stream starts and stops, trouble starting your streams, and have to strain to urinate .  Gastrointestinal (Lower):   Patient denies diarrhea and constipation.  Gastrointestinal (Upper):   Patient denies nausea and vomiting.  Constitutional:   Patient denies fever, night sweats, weight loss, and fatigue.  Skin:   Patient denies skin rash/ lesion and  itching.  Eyes:   Patient denies blurred vision and double vision.  Ears/ Nose/ Throat:   Patient denies sore throat and sinus problems.  Hematologic/Lymphatic:   Patient denies swollen glands and easy bruising.  Cardiovascular:   Patient denies leg swelling and chest pains.  Respiratory:   Patient denies cough and shortness of breath.  Endocrine:   Patient  denies excessive thirst.  Musculoskeletal:   Patient denies back pain and joint pain.  Neurological:   Patient denies headaches and dizziness.  Psychologic:   Patient denies depression and anxiety.   VITAL SIGNS: None   GU PHYSICAL EXAMINATION:    Urethral Meatus: Normal size. No lesion, no wart, no polyp, no balanitis, no discharge. Normal location.   Penis: Circumcised, no foreskin warts, no cracks. No dorsal peyronie's plaques, no left corporal peyronie's plaques, no right corporal peyronie's plaques, no scarring, no shaft warts. No balanitis, no meatal stenosis.    MULTI-SYSTEM PHYSICAL EXAMINATION:    Constitutional: Well-nourished. No physical deformities. Normally developed. Good grooming.     PAST DATA REVIEWED:  Source Of History:  Patient  Records Review:   Pathology Reports  X-Ray Review: C.T. Chest/ Abd/Pelvis: Reviewed Films.     PROCEDURES: None   ASSESSMENT:      ICD-10 Details  1 GU:   Bladder Cancer, overlapping sites - C67.8    PLAN:           Schedule Return Visit: Other See Visit Notes             Note: Medical Oncology referral to Dr. Alen Blew to discuss neoadjuvant chemotherapy for bladder cancer.          Document Letter(s):  Created for Patient: Clinical Summary         Notes:   1. Muscle invasive bladder cancer:We reviewed his pathology which confirms muscle invasive bladder cancer. I discussed the implications of this with his family today. They understand that he is at risk for harboring micrometastatic disease or developing metastatic disease without further treatment.   We reviewed standard of care recommendations including consideration of neoadjuvant chemotherapy followed by radical cystoprostatectomy, pelvic lymphadenectomy, and urinary diversion. We discussed the nature of these treatments today including the potential risks and side effects of these treatments in detail. We also discussed various forms of urinary diversion.   Overall, Mr.  Brian Montgomery is relatively healthy at age 80. However, he does have significant cognitive decline and has had some neurologic changes that are currently being evaluated that have left him unable to ambulate well without assistance. His daughters and his wife have appropriate concern about his ability to tolerate such a serious treatment regimen. However, they also ordered definitely interested in proceeding with recommended therapy if it will extend his survival and maintain his quality of life while decreasing his risk of developing pain and suffering.   After a long and detailed discussion, we have agreed to have him be seen by Dr. Alen Blew to discuss neoadjuvant chemotherapy. He and his family will give further consideration to his options. If ultimately, they elect to avoid radical cystectomy, we have discussed alternative options such as repeat TURBT with possible radiation to the prostate in conjunction with chemo sensitizing agents.   They will contact me after they have spoken with Dr. Alen Blew and given their options further follow-up.   2. Prostate nodule: We have elected to forego further evaluation of his prostate nodule at this time. If he does proceed with radical cystoprostatectomy, his prostate was obviously  be removed at the time of surgery and will allow for further diagnosis. If he elects to forego aggressive curative therapy of his bladder cancer, we will discuss whether he wishes to proceed with further evaluation of his prostate nodule and we'll consider a biopsy in the future. In the meantime, his biopsy for later this week has been canceled.     APPENDED NOTES:  I spoke with Dr. Alen Blew and Dr. Tammi Klippel today. Mr. Agrawal has elected to proceed with trimodality therapy for treatment of his bladder cancer. He will proceed with repeat TUR to make sure all gross disease is resected followed by radiation with chemosensitizing chemotherapy. I explained this to the patient, his wife, and his  daughter. He will be scheduled for TURBT. He also will undergo a prostate biopsy at the same time considering his elevated PSA and abnormal DRE in light of the fact he may require radiation to his prostate simultaneously. We reviewed the risks, complications, and recovery process associated with this procedure and he gives his informed consent to proceed.     * Signed by Raynelle Bring, M.D. on 03/01/16 at 4:21 PM (EDT)*

## 2016-03-22 ENCOUNTER — Encounter (HOSPITAL_COMMUNITY): Payer: Self-pay | Admitting: *Deleted

## 2016-03-22 ENCOUNTER — Ambulatory Visit (HOSPITAL_COMMUNITY)
Admission: RE | Admit: 2016-03-22 | Discharge: 2016-03-22 | Disposition: A | Discharge status: Home / Self Care | Payer: Medicare Other | Source: Ambulatory Visit | Attending: Urology | Admitting: Urology

## 2016-03-22 ENCOUNTER — Ambulatory Visit (HOSPITAL_COMMUNITY): Payer: Medicare Other | Admitting: Anesthesiology

## 2016-03-22 ENCOUNTER — Encounter (HOSPITAL_COMMUNITY)
Admission: RE | Disposition: A | Discharge status: Home / Self Care | Payer: Self-pay | Source: Ambulatory Visit | Attending: Urology

## 2016-03-22 DIAGNOSIS — N359 Urethral stricture, unspecified: Secondary | ICD-10-CM | POA: Insufficient documentation

## 2016-03-22 DIAGNOSIS — C61 Malignant neoplasm of prostate: Secondary | ICD-10-CM | POA: Insufficient documentation

## 2016-03-22 DIAGNOSIS — Z7982 Long term (current) use of aspirin: Secondary | ICD-10-CM | POA: Insufficient documentation

## 2016-03-22 DIAGNOSIS — N402 Nodular prostate without lower urinary tract symptoms: Secondary | ICD-10-CM | POA: Insufficient documentation

## 2016-03-22 DIAGNOSIS — C679 Malignant neoplasm of bladder, unspecified: Secondary | ICD-10-CM | POA: Insufficient documentation

## 2016-03-22 DIAGNOSIS — R972 Elevated prostate specific antigen [PSA]: Secondary | ICD-10-CM | POA: Diagnosis not present

## 2016-03-22 DIAGNOSIS — D494 Neoplasm of unspecified behavior of bladder: Secondary | ICD-10-CM | POA: Diagnosis not present

## 2016-03-22 DIAGNOSIS — Z87891 Personal history of nicotine dependence: Secondary | ICD-10-CM | POA: Diagnosis not present

## 2016-03-22 DIAGNOSIS — M1990 Unspecified osteoarthritis, unspecified site: Secondary | ICD-10-CM | POA: Diagnosis not present

## 2016-03-22 DIAGNOSIS — I10 Essential (primary) hypertension: Secondary | ICD-10-CM | POA: Diagnosis not present

## 2016-03-22 DIAGNOSIS — N400 Enlarged prostate without lower urinary tract symptoms: Secondary | ICD-10-CM

## 2016-03-22 DIAGNOSIS — Z8051 Family history of malignant neoplasm of kidney: Secondary | ICD-10-CM | POA: Insufficient documentation

## 2016-03-22 DIAGNOSIS — E785 Hyperlipidemia, unspecified: Secondary | ICD-10-CM | POA: Insufficient documentation

## 2016-03-22 DIAGNOSIS — Z791 Long term (current) use of non-steroidal anti-inflammatories (NSAID): Secondary | ICD-10-CM | POA: Insufficient documentation

## 2016-03-22 DIAGNOSIS — Z79899 Other long term (current) drug therapy: Secondary | ICD-10-CM | POA: Diagnosis not present

## 2016-03-22 DIAGNOSIS — C689 Malignant neoplasm of urinary organ, unspecified: Secondary | ICD-10-CM | POA: Diagnosis not present

## 2016-03-22 HISTORY — PX: CYSTOSCOPY: SHX5120

## 2016-03-22 HISTORY — PX: PROSTATE BIOPSY: SHX241

## 2016-03-22 HISTORY — PX: TRANSURETHRAL RESECTION OF BLADDER TUMOR: SHX2575

## 2016-03-22 LAB — BASIC METABOLIC PANEL
Anion gap: 8 (ref 5–15)
BUN: 20 mg/dL (ref 6–20)
CHLORIDE: 105 mmol/L (ref 101–111)
CO2: 25 mmol/L (ref 22–32)
Calcium: 9.6 mg/dL (ref 8.9–10.3)
Creatinine, Ser: 1.25 mg/dL — ABNORMAL HIGH (ref 0.61–1.24)
GFR calc non Af Amer: 53 mL/min — ABNORMAL LOW (ref >60)
Glucose, Bld: 95 mg/dL (ref 65–99)
POTASSIUM: 3.8 mmol/L (ref 3.5–5.1)
SODIUM: 138 mmol/L (ref 135–145)

## 2016-03-22 SURGERY — TURBT (TRANSURETHRAL RESECTION OF BLADDER TUMOR)
Anesthesia: General

## 2016-03-22 MED ORDER — PROPOFOL 500 MG/50ML IV EMUL
INTRAVENOUS | Status: DC | PRN
  Administered 2016-03-22: 100 ug/kg/min via INTRAVENOUS

## 2016-03-22 MED ORDER — LACTATED RINGERS IV SOLN
INTRAVENOUS | Status: DC
  Administered 2016-03-22: 13:00:00 via INTRAVENOUS

## 2016-03-22 MED ORDER — DEXTROSE 5 % IV SOLN
INTRAVENOUS | Status: AC
  Filled 2016-03-22: qty 2

## 2016-03-22 MED ORDER — PHENYLEPHRINE 40 MCG/ML (10ML) SYRINGE FOR IV PUSH (FOR BLOOD PRESSURE SUPPORT)
PREFILLED_SYRINGE | INTRAVENOUS | Status: AC
  Filled 2016-03-22: qty 10

## 2016-03-22 MED ORDER — PROPOFOL 10 MG/ML IV BOLUS
INTRAVENOUS | Status: AC
  Filled 2016-03-22: qty 40

## 2016-03-22 MED ORDER — DEXTROSE 5 % IV SOLN
2.0000 g | INTRAVENOUS | Status: AC
  Administered 2016-03-22: 2 g via INTRAVENOUS

## 2016-03-22 MED ORDER — PHENYLEPHRINE HCL 10 MG/ML IJ SOLN: INTRAMUSCULAR | Status: DC | PRN

## 2016-03-22 MED ORDER — MIDAZOLAM HCL 5 MG/5ML IJ SOLN
INTRAMUSCULAR | Status: DC | PRN
  Administered 2016-03-22 (×2): 1 mg via INTRAVENOUS

## 2016-03-22 MED ORDER — FENTANYL CITRATE (PF) 100 MCG/2ML IJ SOLN: 25.0000 ug | INTRAMUSCULAR | Status: DC | PRN

## 2016-03-22 MED ORDER — SODIUM CHLORIDE 0.9 % IR SOLN
Status: DC | PRN
  Administered 2016-03-22: 6000 mL via INTRAVESICAL

## 2016-03-22 MED ORDER — FENTANYL CITRATE (PF) 100 MCG/2ML IJ SOLN
INTRAMUSCULAR | Status: DC | PRN
  Administered 2016-03-22 (×2): 25 ug via INTRAVENOUS
  Administered 2016-03-22: 50 ug via INTRAVENOUS

## 2016-03-22 MED ORDER — LIDOCAINE HCL 2 % IJ SOLN
INTRAMUSCULAR | Status: DC | PRN
  Administered 2016-03-22: 10 mL

## 2016-03-22 MED ORDER — FLEET ENEMA 7-19 GM/118ML RE ENEM: 1.0000 | ENEMA | Freq: Once | RECTAL | Status: DC

## 2016-03-22 MED ORDER — MIDAZOLAM HCL 2 MG/2ML IJ SOLN
INTRAMUSCULAR | Status: AC
  Filled 2016-03-22: qty 2

## 2016-03-22 MED ORDER — ONDANSETRON HCL 4 MG/2ML IJ SOLN
INTRAMUSCULAR | Status: AC
  Filled 2016-03-22: qty 2

## 2016-03-22 MED ORDER — LIDOCAINE HCL 2 % IJ SOLN
INTRAMUSCULAR | Status: AC
  Filled 2016-03-22: qty 20

## 2016-03-22 MED ORDER — LIDOCAINE 2% (20 MG/ML) 5 ML SYRINGE
INTRAMUSCULAR | Status: DC | PRN
  Administered 2016-03-22: 40 mg via INTRAVENOUS

## 2016-03-22 MED ORDER — FENTANYL CITRATE (PF) 100 MCG/2ML IJ SOLN
INTRAMUSCULAR | Status: AC
  Filled 2016-03-22: qty 2

## 2016-03-22 MED ORDER — ONDANSETRON HCL 4 MG/2ML IJ SOLN: 4.0000 mg | Freq: Once | INTRAMUSCULAR | Status: DC | PRN

## 2016-03-22 MED ORDER — PHENAZOPYRIDINE HCL 100 MG PO TABS: 100.0000 mg | ORAL_TABLET | Freq: Three times a day (TID) | ORAL | 0 refills | Status: DC | PRN

## 2016-03-22 MED ORDER — ONDANSETRON HCL 4 MG/2ML IJ SOLN
INTRAMUSCULAR | Status: DC | PRN
Start: 2016-03-22 — End: 2016-03-22
  Administered 2016-03-22: 4 mg via INTRAVENOUS

## 2016-03-22 MED ORDER — PROPOFOL 10 MG/ML IV BOLUS
INTRAVENOUS | Status: DC | PRN
  Administered 2016-03-22: 40 mg via INTRAVENOUS
  Administered 2016-03-22: 50 mg via INTRAVENOUS

## 2016-03-22 MED ORDER — PROPOFOL 10 MG/ML IV BOLUS
INTRAVENOUS | Status: AC
  Filled 2016-03-22: qty 20

## 2016-03-22 MED ORDER — LIDOCAINE 2% (20 MG/ML) 5 ML SYRINGE
INTRAMUSCULAR | Status: AC
  Filled 2016-03-22: qty 5

## 2016-03-22 MED ORDER — PHENYLEPHRINE 40 MCG/ML (10ML) SYRINGE FOR IV PUSH (FOR BLOOD PRESSURE SUPPORT)
PREFILLED_SYRINGE | INTRAVENOUS | Status: DC | PRN
  Administered 2016-03-22 (×3): 80 ug via INTRAVENOUS

## 2016-03-22 SURGICAL SUPPLY — 21 items
BAG URINE DRAINAGE (UROLOGICAL SUPPLIES) IMPLANT
BAG URO CATCHER STRL LF (MISCELLANEOUS) ×3 IMPLANT
CATH INTERMIT  6FR 70CM (CATHETERS) ×3 IMPLANT
CLOTH BEACON ORANGE TIMEOUT ST (SAFETY) ×3 IMPLANT
ELECT REM PT RETURN 9FT ADLT (ELECTROSURGICAL)
ELECTRODE REM PT RTRN 9FT ADLT (ELECTROSURGICAL) ×1 IMPLANT
EVACUATOR MICROVAS BLADDER (UROLOGICAL SUPPLIES) IMPLANT
GLOVE BIOGEL M STRL SZ7.5 (GLOVE) ×3 IMPLANT
GOWN STRL REUS W/TWL LRG LVL3 (GOWN DISPOSABLE) ×6 IMPLANT
GUIDEWIRE STR DUAL SENSOR (WIRE) ×3 IMPLANT
INSTR BIOPSY MAXCORE 18GX20 (NEEDLE) ×2 IMPLANT
LOOP CUT BIPOLAR 24F LRG (ELECTROSURGICAL) IMPLANT
MANIFOLD NEPTUNE II (INSTRUMENTS) ×3 IMPLANT
PACK CYSTO (CUSTOM PROCEDURE TRAY) ×3 IMPLANT
SET ASPIRATION TUBING (TUBING) IMPLANT
SYR CONTROL 10ML LL (SYRINGE) IMPLANT
SYRINGE IRR TOOMEY STRL 70CC (SYRINGE) IMPLANT
TUBING CONNECTING 10 (TUBING) ×2 IMPLANT
TUBING CONNECTING 10' (TUBING) ×1
TUBING TUR DISP (UROLOGICAL SUPPLIES) IMPLANT
UNDERPAD 30X30 INCONTINENT (UNDERPADS AND DIAPERS) ×3 IMPLANT

## 2016-03-22 NOTE — Anesthesia Preprocedure Evaluation (Addendum)
Anesthesia Evaluation  Patient identified by MRN, date of birth, ID band Patient awake    Reviewed: Allergy & Precautions, NPO status , Patient's Chart, lab work & pertinent test results  History of Anesthesia Complications Negative for: history of anesthetic complications  Airway Mallampati: III  TM Distance: >3 FB Neck ROM: Full    Dental  (+) Partial Lower, Partial Upper, Dental Advisory Given   Pulmonary neg shortness of breath, neg sleep apnea, neg COPD, neg recent URI, former smoker,  Non-productive cough   Pulmonary exam normal breath sounds clear to auscultation       Cardiovascular hypertension, Pt. on medications (-) angina(-) Past MI, (-) Cardiac Stents, (-) CABG and (-) Orthopnea  Rhythm:Regular Rate:Normal  HLD   Neuro/Psych neg Seizures Supranuclear palsy    GI/Hepatic negative GI ROS,   Endo/Other  negative endocrine ROS  Renal/GU negative Renal ROS   Bladder cancer    Musculoskeletal  (+) Arthritis ,   Abdominal   Peds  Hematology negative hematology ROS (+)   Anesthesia Other Findings   Reproductive/Obstetrics                            Anesthesia Physical Anesthesia Plan  ASA: III  Anesthesia Plan: General   Post-op Pain Management:    Induction: Intravenous  Airway Management Planned: LMA  Additional Equipment:   Intra-op Plan:   Post-operative Plan: Extubation in OR  Informed Consent: I have reviewed the patients History and Physical, chart, labs and discussed the procedure including the risks, benefits and alternatives for the proposed anesthesia with the patient or authorized representative who has indicated his/her understanding and acceptance.   Dental advisory given  Plan Discussed with: CRNA  Anesthesia Plan Comments: (Risks of general anesthesia discussed including, but not limited to, sore throat, hoarse voice, chipped/damaged teeth, injury  to vocal cords, nausea and vomiting, allergic reactions, lung infection, heart attack, stroke, and death. All questions answered. )       Anesthesia Quick Evaluation

## 2016-03-22 NOTE — Anesthesia Procedure Notes (Signed)
Procedure Name: LMA Insertion Date/Time: 03/22/2016 2:03 PM Performed by: Lajuana Carry E Pre-anesthesia Checklist: Patient identified, Emergency Drugs available, Patient being monitored and Suction available Patient Re-evaluated:Patient Re-evaluated prior to inductionOxygen Delivery Method: Circle system utilized Preoxygenation: Pre-oxygenation with 100% oxygen Intubation Type: IV induction Ventilation: Mask ventilation without difficulty LMA: LMA inserted LMA Size: 4.0 Number of attempts: 1 Placement Confirmation: positive ETCO2 and breath sounds checked- equal and bilateral Dental Injury: Teeth and Oropharynx as per pre-operative assessment

## 2016-03-22 NOTE — Anesthesia Postprocedure Evaluation (Signed)
Anesthesia Post Note  Patient: Brian Montgomery  Procedure(s) Performed: Procedure(s) (LRB): TRANSURETHRAL RESECTION OF BLADDER TUMOR (TURBT) (N/A) CYSTOSCOPY (N/A) BIOPSY TRANSRECTAL ULTRASONIC PROSTATE (TUBP) (N/A)  Patient location during evaluation: PACU Anesthesia Type: General Level of consciousness: awake and alert Pain management: pain level controlled Vital Signs Assessment: post-procedure vital signs reviewed and stable Respiratory status: spontaneous breathing, nonlabored ventilation and respiratory function stable Cardiovascular status: blood pressure returned to baseline and stable Postop Assessment: no signs of nausea or vomiting Anesthetic complications: no    Last Vitals:  Vitals:   03/22/16 1515 03/22/16 1524  BP: 132/90 125/74  Pulse: 78 77  Resp: 18 14  Temp: 36.8 C 36.3 C    Last Pain:  Vitals:   03/22/16 1515  TempSrc:   PainSc: 0-No pain                 Nilda Simmer

## 2016-03-22 NOTE — Op Note (Signed)
Preoperative diagnosis: 1. Bladder tumor (1.5 cm) 2. Elevated PSA/abnormal digital rectal exam  Postoperative diagnosis:  1. Bladder tumor (1.5 cm) 2. Elevated PSA/abnormal digital rectal exam  Procedure:  1. Cystoscopy 2. Transurethral resection of bladder tumor 3. Transrectal ultrasound of the prostate, prostate needle biopsy  Surgeon: Pryor Curia. M.D.  Anesthesia: General  Complications: None  Intraoperative findings:  1. Bladder tumor: There was noted to be a residual area of necrotic tissue just lateral to the right ureteral orifice consistent with patient's prior tumor location.  EBL: Minimal  Specimens: 1.  Right lateral base prostate 2.  Right base prostate 3.  Right lateral mid prostate 4.  Right mid prostate 5.  Right lateral apex prostate 6.  Right apex prostate 7.  Left lateral base prostate 8.  Left base prostate 9.  Left lateral mid prostate 10.  Left mid prostate 11.  Left lateral apex prostate 12.  Left apex prostate 13.  Bladder tumor (residual)  Disposition of specimens: Pathology  Indication: Brian Montgomery is a patient who has a history of muscle invasive bladder cancer. He is scheduled to undergo therapy with radiation and chemotherapy and presents today to ensure complete debulking of his primary tumor.  In addition, he does have an elevated PSA and abnormal digital rectal exam presents for prostate biopsy. After reviewing the management options for treatment, he elected to proceed with the above surgical procedure(s). We have discussed the potential benefits and risks of the procedure, side effects of the proposed treatment, the likelihood of the patient achieving the goals of the procedure, and any potential problems that might occur during the procedure or recuperation. Informed consent has been obtained.  Description of procedure:  The patient was first placed in the left lateral decubitus position and administered IV sedation.  A  preoperative timeout was performed.  He was administered preoperative antibiotics.  A digital rectal exam was performed and demonstrated firmness of the left side of the prostate with concern for possible extraprostatic extension.  The 10 MHz ultrasound probe was then placed into the rectum and the prostate examined.  The prostate was measured at approximately 41 cc.  The prostate tissue is mostly homogeneous with some loss of contour of the left lateral aspect of the prostate.  No discrete hypoechoic lesions were appreciated.  10 cc of 2% lidocaine was then injected into the periprostatic tissues at the junction of the seminal vesicles and prostate for a periprostatic nerve block.  I then proceeded to take 12 core biopsies under direct ultrasound guidance from the lateral and parasagittal regions of the right and left base, mid, and apical prostate gland.  These were sent in formalin for permanent analysis.  The patient tolerated the procedure well.  The probe was removed and a repeat rectal exam revealed no evidence of ongoing bleeding.  The patient was then repositioned in the dorsal lithotomy position and general anesthesia was induced.  He was prepped and draped in the usual sterile fashion, and preoperative antibiotics were administered. A preoperative time-out was performed.   Cystourethroscopy was performed.  The patient did have a distal urethral stricture requiring [employer] sounds or serial dilation from 20 [place] up to 52 [place].  This allowed placement of the 28 French resectoscope under direct vision with the visual obturator.  The urethra was unremarkable.  Inspection of the bladder revealed a residual 1.5 cm area of necrotic tissue just lateral to the right ureteral orifice without other abnormalities noted throughout the bladder  on systematic examination.  Using loop cutting bipolar resection, this area was resected in its entirety leaving no visual residual tumor or abnormal tissue.   Hemostasis was achieved with electrocautery.  The bladder was emptied and reinspected.  No evidence of bleeding.  The bladder was then emptied and the procedure ended.  The patient appeared to tolerate the procedure well and without complications.  The patient was able to be awakened and transferred to the recovery unit in satisfactory condition.    Pryor Curia MD

## 2016-03-22 NOTE — Discharge Instructions (Addendum)
° ° °  General Anesthesia, Adult, Care After Refer to this sheet in the next few weeks. These instructions provide you with information on caring for yourself after your procedure. Your health care provider may also give you more specific instructions. Your treatment has been planned according to current medical practices, but problems sometimes occur. Call your health care provider if you have any problems or questions after your procedure. WHAT TO EXPECT AFTER THE PROCEDURE After the procedure, it is typical to experience: Sleepiness. Nausea and vomiting. HOME CARE INSTRUCTIONS For the first 24 hours after general anesthesia: Have a responsible person with you. Do not drive a car. If you are alone, do not take public transportation. Do not drink alcohol. Do not take medicine that has not been prescribed by your health care provider. Do not sign important papers or make important decisions. You may resume a normal diet and activities as directed by your health care provider. Change bandages (dressings) as directed. If you have questions or problems that seem related to general anesthesia, call the hospital and ask for the anesthetist or anesthesiologist on call. SEEK MEDICAL CARE IF: You have nausea and vomiting that continue the day after anesthesia. You develop a rash. SEEK IMMEDIATE MEDICAL CARE IF:  You have difficulty breathing. You have chest pain. You have any allergic problems.   This information is not intended to replace advice given to you by your health care provider. Make sure you discuss any questions you have with your health care provider.   Document Released: 10/03/2000 Document Revised: 07/18/2014 Document Reviewed: 10/26/2011 Elsevier Interactive Patient Education 2016 Brian Montgomery may see some blood in the urine and may have some burning with urination for 48-72 hours. You also may notice that you have to urinate more frequently or urgently after your  procedure which is normal.  2. You should call should you develop an inability urinate, fever > 101, persistent nausea and vomiting that prevents you from eating or drinking to stay hydrated.

## 2016-03-22 NOTE — Interval H&P Note (Signed)
History and Physical Interval Note:  03/22/2016 1:08 PM  Brian Montgomery  has presented today for surgery, with the diagnosis of BLADDER CANCER  The various methods of treatment have been discussed with the patient and family. After consideration of risks, benefits and other options for treatment, the patient has consented to  Procedure(s): TRANSURETHRAL RESECTION OF BLADDER TUMOR (TURBT) (N/A) CYSTOSCOPY (N/A) BIOPSY TRANSRECTAL ULTRASONIC PROSTATE (TUBP) (N/A) as a surgical intervention .  The patient's history has been reviewed, patient examined, no change in status, stable for surgery.  I have reviewed the patient's chart and labs.  Questions were answered to the patient's satisfaction.     [place],LES

## 2016-03-22 NOTE — Progress Notes (Signed)
  March 22, 2016  Patient: Brian Montgomery  Date of Birth: 02-09-1936  Date of Visit: 03/09/2016    To Whom It May Concern:  Brian Montgomery was seen and treated in our surgical center on 03/22/16. Please excuse Truddie Coco (daughter) and Sherlynn Stalls (grandson) from work.  Sincerely,  [place] [phone]

## 2016-03-22 NOTE — Transfer of Care (Signed)
Immediate Anesthesia Transfer of Care Note  Patient: Brian Montgomery  Procedure(s) Performed: Procedure(s): TRANSURETHRAL RESECTION OF BLADDER TUMOR (TURBT) (N/A) CYSTOSCOPY (N/A) BIOPSY TRANSRECTAL ULTRASONIC PROSTATE (TUBP) (N/A)  Patient Location: PACU  Anesthesia Type:General  Level of Consciousness:  sedated, patient cooperative and responds to stimulation  Airway & Oxygen Therapy:Patient Spontanous Breathing and Patient connected to face mask oxgen  Post-op Assessment:  Report given to PACU RN and Post -op Vital signs reviewed and stable  Post vital signs:  Reviewed and stable  Last Vitals:  Vitals:   03/22/16 1147  BP: 132/88  Pulse: 74  Resp: 20  Temp: [id number] C    Complications: No apparent anesthesia complications

## 2016-03-23 ENCOUNTER — Ambulatory Visit: Payer: Medicare Other | Admitting: Radiation Oncology

## 2016-03-23 ENCOUNTER — Ambulatory Visit: Payer: Medicare Other

## 2016-03-31 ENCOUNTER — Other Ambulatory Visit: Payer: Medicare Other

## 2016-03-31 ENCOUNTER — Ambulatory Visit: Payer: Medicare Other

## 2016-04-06 ENCOUNTER — Other Ambulatory Visit: Payer: Self-pay | Admitting: Radiation Oncology

## 2016-04-06 DIAGNOSIS — C61 Malignant neoplasm of prostate: Secondary | ICD-10-CM | POA: Diagnosis not present

## 2016-04-06 DIAGNOSIS — C678 Malignant neoplasm of overlapping sites of bladder: Secondary | ICD-10-CM | POA: Diagnosis not present

## 2016-04-07 ENCOUNTER — Other Ambulatory Visit: Payer: Medicare Other

## 2016-04-07 ENCOUNTER — Ambulatory Visit: Payer: Medicare Other

## 2016-04-07 ENCOUNTER — Telehealth: Payer: Self-pay | Admitting: *Deleted

## 2016-04-07 NOTE — Telephone Encounter (Signed)
Called patient to inform of appt. For gold seed placement on 04-13-16 @ 2 pm @ Dr. Lynne Logan Office and his sim appt. On 04-15-16 @ 10 am @ Dr. Johny Shears Office, spoke with patient's wife and she is aware of these appts.

## 2016-04-13 DIAGNOSIS — C61 Malignant neoplasm of prostate: Secondary | ICD-10-CM | POA: Diagnosis not present

## 2016-04-14 ENCOUNTER — Ambulatory Visit: Payer: Medicare Other | Admitting: Oncology

## 2016-04-14 ENCOUNTER — Ambulatory Visit: Payer: Medicare Other

## 2016-04-14 ENCOUNTER — Other Ambulatory Visit: Payer: Medicare Other

## 2016-04-15 ENCOUNTER — Ambulatory Visit
Admission: RE | Admit: 2016-04-15 | Discharge: 2016-04-15 | Disposition: A | Discharge status: Home / Self Care | Payer: Medicare Other | Source: Ambulatory Visit | Attending: Radiation Oncology | Admitting: Radiation Oncology

## 2016-04-15 ENCOUNTER — Telehealth: Payer: Self-pay | Admitting: Neurology

## 2016-04-15 DIAGNOSIS — Z510 Encounter for antineoplastic radiation therapy: Secondary | ICD-10-CM | POA: Diagnosis not present

## 2016-04-15 DIAGNOSIS — I10 Essential (primary) hypertension: Secondary | ICD-10-CM | POA: Diagnosis not present

## 2016-04-15 DIAGNOSIS — E785 Hyperlipidemia, unspecified: Secondary | ICD-10-CM | POA: Diagnosis not present

## 2016-04-15 DIAGNOSIS — N529 Male erectile dysfunction, unspecified: Secondary | ICD-10-CM | POA: Diagnosis not present

## 2016-04-15 DIAGNOSIS — M1990 Unspecified osteoarthritis, unspecified site: Secondary | ICD-10-CM | POA: Diagnosis not present

## 2016-04-15 DIAGNOSIS — C672 Malignant neoplasm of lateral wall of bladder: Secondary | ICD-10-CM | POA: Diagnosis not present

## 2016-04-15 DIAGNOSIS — Z87891 Personal history of nicotine dependence: Secondary | ICD-10-CM | POA: Diagnosis not present

## 2016-04-15 DIAGNOSIS — C61 Malignant neoplasm of prostate: Secondary | ICD-10-CM

## 2016-04-15 MED ORDER — DONEPEZIL HCL 5 MG PO TABS: 5.0000 mg | ORAL_TABLET | Freq: Every day | ORAL | 1 refills | Status: DC

## 2016-04-15 NOTE — Progress Notes (Signed)
  Radiation Oncology         [phone]) [phone] ________________________________  Name: Brian Montgomery MRN: [id number]  Date: 04/15/2016  DOB: 03/14/1936  SIMULATION AND TREATMENT PLANNING NOTE    ICD-9-CM ICD-10-CM   1. Malignant neoplasm of lateral wall of urinary bladder (HCC) 188.2 C67.2     DIAGNOSIS:  80 yo man with muscle invasive bladder cancer and low risk T1c Gleason 6 prostate cancer  NARRATIVE:  The patient was brought to the [facility].  Identity was confirmed.  All relevant records and images related to the planned course of therapy were reviewed.  The patient freely provided informed written consent to proceed with treatment after reviewing the details related to the planned course of therapy. The consent form was witnessed and verified by the simulation staff.  Then, the patient was set-up in a stable reproducible  supine position for radiation therapy.  CT images were obtained.  Surface markings were placed.  The CT images were loaded into the planning software.  Then the target and avoidance structures were contoured.  Treatment planning then occurred.  The radiation prescription was entered and confirmed.  Then, I designed and supervised the construction of a total of one medically necessary complex treatment devices.  I have requested : Intensity Modulated Radiotherapy (IMRT) is medically necessary for this case for the following reason:  Rectal sparing.  PLAN:  The patient will receive 75 Gy in 40 fraction with 45 Gy to pelvic nodes, 65 Gy to bladder tumor and 75 Gy to prostate  ________________________________  Sheral Apley. Tammi Klippel, M.D.  This document serves as a record of services personally performed by Tyler Pita, MD. It was created on his behalf by Arlyce Harman, a trained medical scribe. The creation of this record is based on the scribe's personal observations and the provider's statements to them. This document has been checked and approved by the  attending provider.

## 2016-04-15 NOTE — Telephone Encounter (Signed)
Brian Montgomery [birth year]. # [phone] Kim (Daughter) or can call mom at the home #. He will need a refill on  Donepezil 5 Mg at  [facility] on  [address]. Since his last visit with Dr. [name] he has been diagnosed with  Prostate and Bladder Cancer. He is  having to have radiation,  so he did have to reschedule his upcoming appointment for 10/26 due to treatment. He did reschedule for January. His daughter said he will run out before his next appointment. Thank you

## 2016-04-15 NOTE — Telephone Encounter (Signed)
RX sent to pharmacy. Relayed new cancer dx to provider.

## 2016-04-19 ENCOUNTER — Ambulatory Visit: Payer: Medicare Other | Admitting: Radiation Oncology

## 2016-04-21 ENCOUNTER — Other Ambulatory Visit: Payer: Medicare Other

## 2016-04-21 ENCOUNTER — Ambulatory Visit: Payer: Medicare Other

## 2016-04-21 DIAGNOSIS — M1990 Unspecified osteoarthritis, unspecified site: Secondary | ICD-10-CM | POA: Diagnosis not present

## 2016-04-21 DIAGNOSIS — I10 Essential (primary) hypertension: Secondary | ICD-10-CM | POA: Diagnosis not present

## 2016-04-21 DIAGNOSIS — C672 Malignant neoplasm of lateral wall of bladder: Secondary | ICD-10-CM | POA: Diagnosis not present

## 2016-04-21 DIAGNOSIS — N529 Male erectile dysfunction, unspecified: Secondary | ICD-10-CM | POA: Diagnosis not present

## 2016-04-21 DIAGNOSIS — Z510 Encounter for antineoplastic radiation therapy: Secondary | ICD-10-CM | POA: Diagnosis not present

## 2016-04-21 DIAGNOSIS — E785 Hyperlipidemia, unspecified: Secondary | ICD-10-CM | POA: Diagnosis not present

## 2016-04-26 ENCOUNTER — Encounter: Payer: Self-pay | Admitting: Medical Oncology

## 2016-04-26 ENCOUNTER — Ambulatory Visit
Admission: RE | Admit: 2016-04-26 | Discharge: 2016-04-26 | Disposition: A | Discharge status: Home / Self Care | Payer: Medicare Other | Source: Ambulatory Visit | Attending: Radiation Oncology | Admitting: Radiation Oncology

## 2016-04-26 DIAGNOSIS — C672 Malignant neoplasm of lateral wall of bladder: Secondary | ICD-10-CM | POA: Diagnosis not present

## 2016-04-26 DIAGNOSIS — Z510 Encounter for antineoplastic radiation therapy: Secondary | ICD-10-CM | POA: Diagnosis not present

## 2016-04-26 DIAGNOSIS — E785 Hyperlipidemia, unspecified: Secondary | ICD-10-CM | POA: Diagnosis not present

## 2016-04-26 DIAGNOSIS — N529 Male erectile dysfunction, unspecified: Secondary | ICD-10-CM | POA: Diagnosis not present

## 2016-04-26 DIAGNOSIS — I10 Essential (primary) hypertension: Secondary | ICD-10-CM | POA: Diagnosis not present

## 2016-04-26 DIAGNOSIS — M1990 Unspecified osteoarthritis, unspecified site: Secondary | ICD-10-CM | POA: Diagnosis not present

## 2016-04-27 ENCOUNTER — Ambulatory Visit
Admission: RE | Admit: 2016-04-27 | Discharge: 2016-04-27 | Disposition: A | Discharge status: Home / Self Care | Payer: Medicare Other | Source: Ambulatory Visit | Attending: Radiation Oncology | Admitting: Radiation Oncology

## 2016-04-27 DIAGNOSIS — N529 Male erectile dysfunction, unspecified: Secondary | ICD-10-CM | POA: Diagnosis not present

## 2016-04-27 DIAGNOSIS — E785 Hyperlipidemia, unspecified: Secondary | ICD-10-CM | POA: Diagnosis not present

## 2016-04-27 DIAGNOSIS — Z510 Encounter for antineoplastic radiation therapy: Secondary | ICD-10-CM | POA: Diagnosis not present

## 2016-04-27 DIAGNOSIS — I10 Essential (primary) hypertension: Secondary | ICD-10-CM | POA: Diagnosis not present

## 2016-04-27 DIAGNOSIS — C672 Malignant neoplasm of lateral wall of bladder: Secondary | ICD-10-CM | POA: Diagnosis not present

## 2016-04-27 DIAGNOSIS — M1990 Unspecified osteoarthritis, unspecified site: Secondary | ICD-10-CM | POA: Diagnosis not present

## 2016-04-28 ENCOUNTER — Other Ambulatory Visit (HOSPITAL_BASED_OUTPATIENT_CLINIC_OR_DEPARTMENT_OTHER): Payer: Medicare Other

## 2016-04-28 ENCOUNTER — Ambulatory Visit
Admission: RE | Admit: 2016-04-28 | Discharge: 2016-04-28 | Disposition: A | Discharge status: Home / Self Care | Payer: Medicare Other | Source: Ambulatory Visit | Attending: Radiation Oncology | Admitting: Radiation Oncology

## 2016-04-28 ENCOUNTER — Encounter: Payer: Self-pay | Admitting: Radiation Oncology

## 2016-04-28 ENCOUNTER — Ambulatory Visit (HOSPITAL_BASED_OUTPATIENT_CLINIC_OR_DEPARTMENT_OTHER): Payer: Medicare Other

## 2016-04-28 VITALS — BP 133/73 | HR 66 | Temp 98.2°F | Resp 16 | Ht 65.0 in | Wt 175.6 lb

## 2016-04-28 VITALS — BP 120/65 | HR 70 | Temp 97.7°F | Resp 16

## 2016-04-28 DIAGNOSIS — N529 Male erectile dysfunction, unspecified: Secondary | ICD-10-CM | POA: Diagnosis not present

## 2016-04-28 DIAGNOSIS — Z510 Encounter for antineoplastic radiation therapy: Secondary | ICD-10-CM | POA: Diagnosis not present

## 2016-04-28 DIAGNOSIS — C679 Malignant neoplasm of bladder, unspecified: Secondary | ICD-10-CM

## 2016-04-28 DIAGNOSIS — Z5111 Encounter for antineoplastic chemotherapy: Secondary | ICD-10-CM | POA: Diagnosis present

## 2016-04-28 DIAGNOSIS — M1990 Unspecified osteoarthritis, unspecified site: Secondary | ICD-10-CM | POA: Diagnosis not present

## 2016-04-28 DIAGNOSIS — I10 Essential (primary) hypertension: Secondary | ICD-10-CM | POA: Diagnosis not present

## 2016-04-28 DIAGNOSIS — C672 Malignant neoplasm of lateral wall of bladder: Secondary | ICD-10-CM | POA: Diagnosis not present

## 2016-04-28 DIAGNOSIS — E785 Hyperlipidemia, unspecified: Secondary | ICD-10-CM | POA: Diagnosis not present

## 2016-04-28 DIAGNOSIS — C61 Malignant neoplasm of prostate: Secondary | ICD-10-CM

## 2016-04-28 LAB — CBC WITH DIFFERENTIAL/PLATELET
BASO%: 0.3 % (ref 0.0–2.0)
BASOS ABS: 0 10*3/uL (ref 0.0–0.1)
EOS ABS: 0.1 10*3/uL (ref 0.0–0.5)
EOS%: 1.6 % (ref 0.0–7.0)
HEMATOCRIT: 38.1 % — AB (ref 38.4–49.9)
HEMOGLOBIN: 13.1 g/dL (ref 13.0–17.1)
LYMPH#: 1.4 10*3/uL (ref 0.9–3.3)
LYMPH%: 19.8 % (ref 14.0–49.0)
MCH: 29.3 pg (ref 27.2–33.4)
MCHC: 34.4 g/dL (ref 32.0–36.0)
MCV: 85.2 fL (ref 79.3–98.0)
MONO#: 0.6 10*3/uL (ref 0.1–0.9)
MONO%: 7.8 % (ref 0.0–14.0)
NEUT#: 5 10*3/uL (ref 1.5–6.5)
NEUT%: 70.5 % (ref 39.0–75.0)
Platelets: 199 10*3/uL (ref 140–400)
RBC: 4.47 10*6/uL (ref 4.20–5.82)
RDW: 13.6 % (ref 11.0–14.6)
WBC: 7.1 10*3/uL (ref 4.0–10.3)

## 2016-04-28 LAB — COMPREHENSIVE METABOLIC PANEL
ALBUMIN: 3.6 g/dL (ref 3.5–5.0)
ALK PHOS: 83 U/L (ref 40–150)
ALT: <6 U/L (ref 0–55)
ANION GAP: 9 meq/L (ref 3–11)
AST: 16 U/L (ref 5–34)
BUN: 18.3 mg/dL (ref 7.0–26.0)
CALCIUM: 9.7 mg/dL (ref 8.4–10.4)
CHLORIDE: 107 meq/L (ref 98–109)
CO2: 24 mEq/L (ref 22–29)
Creatinine: 1.2 mg/dL (ref 0.7–1.3)
EGFR: 70 mL/min/{1.73_m2} — AB (ref >90)
Glucose: 87 mg/dl (ref 70–140)
POTASSIUM: 3.7 meq/L (ref 3.5–5.1)
Sodium: 139 mEq/L (ref 136–145)
Total Bilirubin: 0.58 mg/dL (ref 0.20–1.20)
Total Protein: 7.6 g/dL (ref 6.4–8.3)

## 2016-04-28 MED ORDER — PALONOSETRON HCL INJECTION 0.25 MG/5ML
INTRAVENOUS | Status: AC
  Filled 2016-04-28: qty 5

## 2016-04-28 MED ORDER — HYDROCODONE-ACETAMINOPHEN 5-325 MG PO TABS: 1.0000 | ORAL_TABLET | Freq: Four times a day (QID) | ORAL | 0 refills | Status: DC | PRN

## 2016-04-28 MED ORDER — DEXAMETHASONE SODIUM PHOSPHATE 10 MG/ML IJ SOLN
10.0000 mg | Freq: Once | INTRAMUSCULAR | Status: AC
  Administered 2016-04-28: 10 mg via INTRAVENOUS

## 2016-04-28 MED ORDER — PALONOSETRON HCL INJECTION 0.25 MG/5ML
0.2500 mg | Freq: Once | INTRAVENOUS | Status: AC
  Administered 2016-04-28: 0.25 mg via INTRAVENOUS

## 2016-04-28 MED ORDER — SODIUM CHLORIDE 0.9 % IV SOLN
160.0000 mg | Freq: Once | INTRAVENOUS | Status: AC
  Administered 2016-04-28: 160 mg via INTRAVENOUS
  Filled 2016-04-28: qty 16

## 2016-04-28 MED ORDER — SODIUM CHLORIDE 0.9 % IV SOLN
Freq: Once | INTRAVENOUS | Status: AC
  Administered 2016-04-28: 12:00:00 via INTRAVENOUS

## 2016-04-28 MED ORDER — DEXAMETHASONE SODIUM PHOSPHATE 10 MG/ML IJ SOLN
INTRAMUSCULAR | Status: AC
  Filled 2016-04-28: qty 1

## 2016-04-28 NOTE — Patient Instructions (Signed)
[facility] Discharge Instructions for Patients Receiving Chemotherapy  Today you received the following chemotherapy agents:  Carboplatin.  To help prevent nausea and vomiting after your treatment, we encourage you to take your nausea medication as directed.   If you develop nausea and vomiting that is not controlled by your nausea medication, call the clinic.   BELOW ARE SYMPTOMS THAT SHOULD BE REPORTED IMMEDIATELY:  *FEVER GREATER THAN 100.5 F  *CHILLS WITH OR WITHOUT FEVER  NAUSEA AND VOMITING THAT IS NOT CONTROLLED WITH YOUR NAUSEA MEDICATION  *UNUSUAL SHORTNESS OF BREATH  *UNUSUAL BRUISING OR BLEEDING  TENDERNESS IN MOUTH AND THROAT WITH OR WITHOUT PRESENCE OF ULCERS  *URINARY PROBLEMS  *BOWEL PROBLEMS  UNUSUAL RASH Items with * indicate a potential emergency and should be followed up as soon as possible.  Feel free to call the clinic you have any questions or concerns. The clinic phone number is [phone].  Please show the [place] at check-in to the Emergency Department and triage nurse.  Carboplatin injection What is this medicine? CARBOPLATIN (KAR boe pla tin) is a chemotherapy drug. It targets fast dividing cells, like cancer cells, and causes these cells to die. This medicine is used to treat ovarian cancer and many other cancers. This medicine may be used for other purposes; ask your health care provider or pharmacist if you have questions. What should I tell my health care provider before I take this medicine? They need to know if you have any of these conditions: -blood disorders -hearing problems -kidney disease -recent or ongoing radiation therapy -an unusual or allergic reaction to carboplatin, cisplatin, other chemotherapy, other medicines, foods, dyes, or preservatives -pregnant or trying to get pregnant -breast-feeding How should I use this medicine? This drug is usually given as an infusion into a vein. It is  administered in a hospital or clinic by a specially trained health care professional. Talk to your pediatrician regarding the use of this medicine in children. Special care may be needed. Overdosage: If you think you have taken too much of this medicine contact a poison control center or emergency room at once. NOTE: This medicine is only for you. Do not share this medicine with others. What if I miss a dose? It is important not to miss a dose. Call your doctor or health care professional if you are unable to keep an appointment. What may interact with this medicine? -medicines for seizures -medicines to increase blood counts like filgrastim, pegfilgrastim, sargramostim -some antibiotics like amikacin, gentamicin, neomycin, streptomycin, tobramycin -vaccines Talk to your doctor or health care professional before taking any of these medicines: -acetaminophen -aspirin -ibuprofen -ketoprofen -naproxen This list may not describe all possible interactions. Give your health care provider a list of all the medicines, herbs, non-prescription drugs, or dietary supplements you use. Also tell them if you smoke, drink alcohol, or use illegal drugs. Some items may interact with your medicine. What should I watch for while using this medicine? Your condition will be monitored carefully while you are receiving this medicine. You will need important blood work done while you are taking this medicine. This drug may make you feel generally unwell. This is not uncommon, as chemotherapy can affect healthy cells as well as cancer cells. Report any side effects. Continue your course of treatment even though you feel ill unless your doctor tells you to stop. In some cases, you may be given additional medicines to help with side effects. Follow all directions for their use.  Call your doctor or health care professional for advice if you get a fever, chills or sore throat, or other symptoms of a cold or flu. Do not  treat yourself. This drug decreases your body's ability to fight infections. Try to avoid being around people who are sick. This medicine may increase your risk to bruise or bleed. Call your doctor or health care professional if you notice any unusual bleeding. Be careful brushing and flossing your teeth or using a toothpick because you may get an infection or bleed more easily. If you have any dental work done, tell your dentist you are receiving this medicine. Avoid taking products that contain aspirin, acetaminophen, ibuprofen, naproxen, or ketoprofen unless instructed by your doctor. These medicines may hide a fever. Do not become pregnant while taking this medicine. Women should inform their doctor if they wish to become pregnant or think they might be pregnant. There is a potential for serious side effects to an unborn child. Talk to your health care professional or pharmacist for more information. Do not breast-feed an infant while taking this medicine. What side effects may I notice from receiving this medicine? Side effects that you should report to your doctor or health care professional as soon as possible: -allergic reactions like skin rash, itching or hives, swelling of the face, lips, or tongue -signs of infection - fever or chills, cough, sore throat, pain or difficulty passing urine -signs of decreased platelets or bleeding - bruising, pinpoint red spots on the skin, black, tarry stools, nosebleeds -signs of decreased red blood cells - unusually weak or tired, fainting spells, lightheadedness -breathing problems -changes in hearing -changes in vision -chest pain -high blood pressure -low blood counts - This drug may decrease the number of white blood cells, red blood cells and platelets. You may be at increased risk for infections and bleeding. -nausea and vomiting -pain, swelling, redness or irritation at the injection site -pain, tingling, numbness in the hands or feet -problems  with balance, talking, walking -trouble passing urine or change in the amount of urine Side effects that usually do not require medical attention (report to your doctor or health care professional if they continue or are bothersome): -hair loss -loss of appetite -metallic taste in the mouth or changes in taste This list may not describe all possible side effects. Call your doctor for medical advice about side effects. You may report side effects to FDA at 1-800-FDA-1088. Where should I keep my medicine? This drug is given in a hospital or clinic and will not be stored at home. NOTE: This sheet is a summary. It may not cover all possible information. If you have questions about this medicine, talk to your doctor, pharmacist, or health care provider.    2016, Elsevier/Gold Standard. (2007-10-02 14:38:05)

## 2016-04-28 NOTE — Progress Notes (Signed)
Weight and vital signs are stable.  No change in bladder pattern, no hematuria or frequent urination.  No diarrhea.  Appetite is good.  Denies fatigue.  Patient teaching done today see education area for documentation. Wt Readings from Last 3 Encounters:  04/28/16 175 lb 9.6 oz (79.7 kg)  03/22/16 174 lb (78.9 kg)  03/17/16 174 lb 6 oz (79.1 kg)  BP 133/73 (BP Location: Left Arm, Patient Position: Sitting, Cuff Size: Normal)   Pulse 66   Temp 98.2 F (36.8 C) (Oral)   Resp 16   Ht 5\' 5"  (1.651 m)   Wt 175 lb 9.6 oz (79.7 kg)   SpO2 100%   BMI 29.22 kg/m

## 2016-04-28 NOTE — Progress Notes (Signed)
  Radiation Oncology         [phone]   Name: Brian Montgomery MRN: [id number]   Date: 04/28/2016  DOB: 06-23-1936     Weekly Radiation Therapy Management    ICD-9-CM ICD-10-CM   1. Malignant neoplasm of prostate ([place]) 185 C61     Current Dose: 5.4 Gy  Planned Dose:  45 Gy  Narrative The patient presents for routine under treatment assessment.  Weight and vitals stable. No change in bladder pattern, no hematuria or frequent urination. No diarrhea. Appetite is good. Denies fatigue. He has chronic pain in his shoulder from a previous injury. His wife has not called in for a refill with his previous physician since he was being seen here. Patient is requesting a refill.  The patient is without complaint. Set-up films were reviewed. The chart was checked.  Physical Findings  height is 5\' 5"  (1.651 m) and weight is 175 lb 9.6 oz (79.7 kg). His oral temperature is 98.2 F (36.8 C). His blood pressure is 133/73 and his pulse is 66. His respiration is 16 and oxygen saturation is 100%. . Weight essentially stable.  No significant changes. Presents in wheelchair.  Impression The patient is tolerating radiation.  Plan Continue treatment as planned. I have refilled his hydrocodone for chronic shoulder pain.         Sheral Apley Tammi Klippel, M.D.  This document serves as a record of services personally performed by Tyler Pita, MD. It was created on his behalf by Arlyce Harman, a trained medical scribe. The creation of this record is based on the scribe's personal observations and the provider's statements to them. This document has been checked and approved by the attending provider.

## 2016-04-29 ENCOUNTER — Ambulatory Visit
Admission: RE | Admit: 2016-04-29 | Discharge: 2016-04-29 | Disposition: A | Discharge status: Home / Self Care | Payer: Medicare Other | Source: Ambulatory Visit | Attending: Radiation Oncology | Admitting: Radiation Oncology

## 2016-04-29 DIAGNOSIS — M1990 Unspecified osteoarthritis, unspecified site: Secondary | ICD-10-CM | POA: Diagnosis not present

## 2016-04-29 DIAGNOSIS — N529 Male erectile dysfunction, unspecified: Secondary | ICD-10-CM | POA: Diagnosis not present

## 2016-04-29 DIAGNOSIS — I10 Essential (primary) hypertension: Secondary | ICD-10-CM | POA: Diagnosis not present

## 2016-04-29 DIAGNOSIS — Z510 Encounter for antineoplastic radiation therapy: Secondary | ICD-10-CM | POA: Diagnosis not present

## 2016-04-29 DIAGNOSIS — E785 Hyperlipidemia, unspecified: Secondary | ICD-10-CM | POA: Diagnosis not present

## 2016-04-29 DIAGNOSIS — C672 Malignant neoplasm of lateral wall of bladder: Secondary | ICD-10-CM | POA: Diagnosis not present

## 2016-05-02 ENCOUNTER — Ambulatory Visit
Admission: RE | Admit: 2016-05-02 | Discharge: 2016-05-02 | Disposition: A | Discharge status: Home / Self Care | Payer: Medicare Other | Source: Ambulatory Visit | Attending: Radiation Oncology | Admitting: Radiation Oncology

## 2016-05-02 DIAGNOSIS — I10 Essential (primary) hypertension: Secondary | ICD-10-CM | POA: Diagnosis not present

## 2016-05-02 DIAGNOSIS — Z510 Encounter for antineoplastic radiation therapy: Secondary | ICD-10-CM | POA: Diagnosis not present

## 2016-05-02 DIAGNOSIS — C672 Malignant neoplasm of lateral wall of bladder: Secondary | ICD-10-CM | POA: Diagnosis not present

## 2016-05-02 DIAGNOSIS — M1990 Unspecified osteoarthritis, unspecified site: Secondary | ICD-10-CM | POA: Diagnosis not present

## 2016-05-02 DIAGNOSIS — E785 Hyperlipidemia, unspecified: Secondary | ICD-10-CM | POA: Diagnosis not present

## 2016-05-02 DIAGNOSIS — N529 Male erectile dysfunction, unspecified: Secondary | ICD-10-CM | POA: Diagnosis not present

## 2016-05-03 ENCOUNTER — Ambulatory Visit
Admission: RE | Admit: 2016-05-03 | Discharge: 2016-05-03 | Disposition: A | Discharge status: Home / Self Care | Payer: Medicare Other | Source: Ambulatory Visit | Attending: Radiation Oncology | Admitting: Radiation Oncology

## 2016-05-03 ENCOUNTER — Other Ambulatory Visit: Payer: Self-pay | Admitting: Oncology

## 2016-05-03 DIAGNOSIS — N529 Male erectile dysfunction, unspecified: Secondary | ICD-10-CM | POA: Diagnosis not present

## 2016-05-03 DIAGNOSIS — M1990 Unspecified osteoarthritis, unspecified site: Secondary | ICD-10-CM | POA: Diagnosis not present

## 2016-05-03 DIAGNOSIS — Z510 Encounter for antineoplastic radiation therapy: Secondary | ICD-10-CM | POA: Diagnosis not present

## 2016-05-03 DIAGNOSIS — E785 Hyperlipidemia, unspecified: Secondary | ICD-10-CM | POA: Diagnosis not present

## 2016-05-03 DIAGNOSIS — C672 Malignant neoplasm of lateral wall of bladder: Secondary | ICD-10-CM | POA: Diagnosis not present

## 2016-05-03 DIAGNOSIS — I10 Essential (primary) hypertension: Secondary | ICD-10-CM | POA: Diagnosis not present

## 2016-05-04 ENCOUNTER — Ambulatory Visit
Admission: RE | Admit: 2016-05-04 | Discharge: 2016-05-04 | Disposition: A | Discharge status: Home / Self Care | Payer: Medicare Other | Source: Ambulatory Visit | Attending: Radiation Oncology | Admitting: Radiation Oncology

## 2016-05-04 ENCOUNTER — Telehealth: Payer: Self-pay | Admitting: *Deleted

## 2016-05-04 DIAGNOSIS — I10 Essential (primary) hypertension: Secondary | ICD-10-CM | POA: Diagnosis not present

## 2016-05-04 DIAGNOSIS — M1990 Unspecified osteoarthritis, unspecified site: Secondary | ICD-10-CM | POA: Diagnosis not present

## 2016-05-04 DIAGNOSIS — E785 Hyperlipidemia, unspecified: Secondary | ICD-10-CM | POA: Diagnosis not present

## 2016-05-04 DIAGNOSIS — C672 Malignant neoplasm of lateral wall of bladder: Secondary | ICD-10-CM | POA: Diagnosis not present

## 2016-05-04 DIAGNOSIS — Z510 Encounter for antineoplastic radiation therapy: Secondary | ICD-10-CM | POA: Diagnosis not present

## 2016-05-04 DIAGNOSIS — N529 Male erectile dysfunction, unspecified: Secondary | ICD-10-CM | POA: Diagnosis not present

## 2016-05-04 NOTE — Telephone Encounter (Signed)
Per LOS I have scheduled appts and notified the scheduler 

## 2016-05-05 ENCOUNTER — Ambulatory Visit (HOSPITAL_BASED_OUTPATIENT_CLINIC_OR_DEPARTMENT_OTHER): Payer: Medicare Other

## 2016-05-05 ENCOUNTER — Ambulatory Visit: Payer: Medicare Other | Admitting: Neurology

## 2016-05-05 ENCOUNTER — Ambulatory Visit
Admission: RE | Admit: 2016-05-05 | Discharge: 2016-05-05 | Disposition: A | Discharge status: Home / Self Care | Payer: Medicare Other | Source: Ambulatory Visit | Attending: Radiation Oncology | Admitting: Radiation Oncology

## 2016-05-05 ENCOUNTER — Other Ambulatory Visit (HOSPITAL_BASED_OUTPATIENT_CLINIC_OR_DEPARTMENT_OTHER): Payer: Medicare Other

## 2016-05-05 VITALS — BP 139/68 | HR 66 | Temp 97.7°F | Resp 17

## 2016-05-05 DIAGNOSIS — Z510 Encounter for antineoplastic radiation therapy: Secondary | ICD-10-CM | POA: Diagnosis not present

## 2016-05-05 DIAGNOSIS — E785 Hyperlipidemia, unspecified: Secondary | ICD-10-CM | POA: Diagnosis not present

## 2016-05-05 DIAGNOSIS — Z5111 Encounter for antineoplastic chemotherapy: Secondary | ICD-10-CM | POA: Diagnosis present

## 2016-05-05 DIAGNOSIS — I10 Essential (primary) hypertension: Secondary | ICD-10-CM | POA: Diagnosis not present

## 2016-05-05 DIAGNOSIS — C679 Malignant neoplasm of bladder, unspecified: Secondary | ICD-10-CM | POA: Diagnosis present

## 2016-05-05 DIAGNOSIS — C672 Malignant neoplasm of lateral wall of bladder: Secondary | ICD-10-CM | POA: Diagnosis not present

## 2016-05-05 DIAGNOSIS — M1990 Unspecified osteoarthritis, unspecified site: Secondary | ICD-10-CM | POA: Diagnosis not present

## 2016-05-05 DIAGNOSIS — N529 Male erectile dysfunction, unspecified: Secondary | ICD-10-CM | POA: Diagnosis not present

## 2016-05-05 LAB — COMPREHENSIVE METABOLIC PANEL
ANION GAP: 10 meq/L (ref 3–11)
AST: 16 U/L (ref 5–34)
Albumin: 3.8 g/dL (ref 3.5–5.0)
Alkaline Phosphatase: 84 U/L (ref 40–150)
BUN: 19.5 mg/dL (ref 7.0–26.0)
CHLORIDE: 108 meq/L (ref 98–109)
CO2: 22 meq/L (ref 22–29)
CREATININE: 1.1 mg/dL (ref 0.7–1.3)
Calcium: 9.7 mg/dL (ref 8.4–10.4)
EGFR: 72 mL/min/{1.73_m2} — ABNORMAL LOW (ref >90)
Glucose: 84 mg/dl (ref 70–140)
Potassium: 3.8 mEq/L (ref 3.5–5.1)
SODIUM: 141 meq/L (ref 136–145)
Total Bilirubin: 0.84 mg/dL (ref 0.20–1.20)
Total Protein: 8 g/dL (ref 6.4–8.3)

## 2016-05-05 LAB — CBC WITH DIFFERENTIAL/PLATELET
BASO%: 0.3 % (ref 0.0–2.0)
Basophils Absolute: 0 10*3/uL (ref 0.0–0.1)
EOS%: 3.1 % (ref 0.0–7.0)
Eosinophils Absolute: 0.2 10*3/uL (ref 0.0–0.5)
HCT: 39.3 % (ref 38.4–49.9)
HGB: 13.4 g/dL (ref 13.0–17.1)
LYMPH%: 12 % — AB (ref 14.0–49.0)
MCH: 29.1 pg (ref 27.2–33.4)
MCHC: 34.1 g/dL (ref 32.0–36.0)
MCV: 85.4 fL (ref 79.3–98.0)
MONO#: 0.4 10*3/uL (ref 0.1–0.9)
MONO%: 7.2 % (ref 0.0–14.0)
NEUT#: 4.5 10*3/uL (ref 1.5–6.5)
NEUT%: 77.4 % — AB (ref 39.0–75.0)
PLATELETS: 195 10*3/uL (ref 140–400)
RBC: 4.6 10*6/uL (ref 4.20–5.82)
RDW: 13.6 % (ref 11.0–14.6)
WBC: 5.9 10*3/uL (ref 4.0–10.3)
lymph#: 0.7 10*3/uL — ABNORMAL LOW (ref 0.9–3.3)

## 2016-05-05 MED ORDER — DEXAMETHASONE SODIUM PHOSPHATE 10 MG/ML IJ SOLN
10.0000 mg | Freq: Once | INTRAMUSCULAR | Status: AC
  Administered 2016-05-05: 10 mg via INTRAVENOUS

## 2016-05-05 MED ORDER — PALONOSETRON HCL INJECTION 0.25 MG/5ML
0.2500 mg | Freq: Once | INTRAVENOUS | Status: AC
  Administered 2016-05-05: 0.25 mg via INTRAVENOUS

## 2016-05-05 MED ORDER — PALONOSETRON HCL INJECTION 0.25 MG/5ML
INTRAVENOUS | Status: AC
  Filled 2016-05-05: qty 5

## 2016-05-05 MED ORDER — SODIUM CHLORIDE 0.9 % IV SOLN
174.4000 mg | Freq: Once | INTRAVENOUS | Status: AC
  Administered 2016-05-05: 170 mg via INTRAVENOUS
  Filled 2016-05-05: qty 17

## 2016-05-05 MED ORDER — DEXAMETHASONE SODIUM PHOSPHATE 10 MG/ML IJ SOLN
INTRAMUSCULAR | Status: AC
  Filled 2016-05-05: qty 1

## 2016-05-05 MED ORDER — SODIUM CHLORIDE 0.9 % IV SOLN
Freq: Once | INTRAVENOUS | Status: AC
  Administered 2016-05-05: 15:00:00 via INTRAVENOUS

## 2016-05-05 NOTE — Patient Instructions (Signed)
[facility] Discharge Instructions for Patients Receiving Chemotherapy  Today you received the following chemotherapy agents: Carboplatin   To help prevent nausea and vomiting after your treatment, we encourage you to take your nausea medication as directed.    If you develop nausea and vomiting that is not controlled by your nausea medication, call the clinic.   BELOW ARE SYMPTOMS THAT SHOULD BE REPORTED IMMEDIATELY:  *FEVER GREATER THAN 100.5 F  *CHILLS WITH OR WITHOUT FEVER  NAUSEA AND VOMITING THAT IS NOT CONTROLLED WITH YOUR NAUSEA MEDICATION  *UNUSUAL SHORTNESS OF BREATH  *UNUSUAL BRUISING OR BLEEDING  TENDERNESS IN MOUTH AND THROAT WITH OR WITHOUT PRESENCE OF ULCERS  *URINARY PROBLEMS  *BOWEL PROBLEMS  UNUSUAL RASH Items with * indicate a potential emergency and should be followed up as soon as possible.  Feel free to call the clinic you have any questions or concerns. The clinic phone number is [phone].  Please show the CHEMO ALERT CARD at check-in to the Emergency Department and triage nurse.   

## 2016-05-06 ENCOUNTER — Ambulatory Visit
Admission: RE | Admit: 2016-05-06 | Discharge: 2016-05-06 | Disposition: A | Discharge status: Home / Self Care | Payer: Medicare Other | Source: Ambulatory Visit | Attending: Radiation Oncology | Admitting: Radiation Oncology

## 2016-05-06 ENCOUNTER — Encounter: Payer: Self-pay | Admitting: Radiation Oncology

## 2016-05-06 VITALS — BP 129/64 | HR 68 | Resp 16 | Wt 165.3 lb

## 2016-05-06 DIAGNOSIS — C61 Malignant neoplasm of prostate: Secondary | ICD-10-CM

## 2016-05-06 DIAGNOSIS — M1990 Unspecified osteoarthritis, unspecified site: Secondary | ICD-10-CM | POA: Diagnosis not present

## 2016-05-06 DIAGNOSIS — I10 Essential (primary) hypertension: Secondary | ICD-10-CM | POA: Diagnosis not present

## 2016-05-06 DIAGNOSIS — E785 Hyperlipidemia, unspecified: Secondary | ICD-10-CM | POA: Diagnosis not present

## 2016-05-06 DIAGNOSIS — C672 Malignant neoplasm of lateral wall of bladder: Secondary | ICD-10-CM | POA: Diagnosis not present

## 2016-05-06 DIAGNOSIS — N529 Male erectile dysfunction, unspecified: Secondary | ICD-10-CM | POA: Diagnosis not present

## 2016-05-06 DIAGNOSIS — Z510 Encounter for antineoplastic radiation therapy: Secondary | ICD-10-CM | POA: Diagnosis not present

## 2016-05-06 NOTE — Progress Notes (Signed)
  Radiation Oncology         [phone]   Name: Brian Montgomery MRN: [id number]   Date: 05/06/2016  DOB: 1936/06/21     Weekly Radiation Therapy Management    ICD-9-CM ICD-10-CM   1. Malignant neoplasm of prostate ([place]) 185 C61     Current Dose: 16.2 Gy  Planned Dose:  45 Gy  Narrative The patient presents for routine under treatment assessment.  Weight loss noted. Wife reports he is eating very well and patient reports his pants remain snug. Vitals stable. Denies pain. Wife confirms he continues to take hydrocodone acetaminophen to relieve left arm pain. Denies hematuria or dysuria. Reports frequent urination. Reports occasional leakage. Reports he changes his incontinence pad approximately twice in a 24 hour period. Reports nocturia x 3. Denies diarrhea. Reports fatigue requiring afternoon naps.  The patient is without complaint. Set-up films were reviewed. The chart was checked.  Physical Findings  weight is 165 lb 4.8 oz (75 kg). His blood pressure is 129/64 and his pulse is 68. His respiration is 16 and oxygen saturation is 100%. . Weight essentially stable.  No significant changes. Presents in wheelchair.  Impression The patient is tolerating radiation.  Plan Continue treatment as planned.       Sheral Apley Tammi Klippel, M.D.  This document serves as a record of services personally performed by Tyler Pita, MD. It was created on his behalf by Maryla Morrow, a trained medical scribe. The creation of this record is based on the scribe's personal observations and the provider's statements to them. This document has been checked and approved by the attending provider.

## 2016-05-06 NOTE — Progress Notes (Signed)
Weight loss noted. Wife reports he is eating very well and patient reports his pants remain snug. Vitals stable. Denies pain. Wife confirms he continues to take hydrocodone acetaminophen to relieve left arm pain. Denies hematuria or dysuria. Reports frequency urination. Reports occasional leakage. Reports he changes his incontinence pad approximately twice in a 24 hour period. Reports nocturia x 3. Denies diarrhea. Reports fatigue requiring afternoon naps.   BP 129/64 (BP Location: Left Arm, Patient Position: Sitting, Cuff Size: Normal)   Pulse 68   Resp 16   Wt 165 lb 4.8 oz (75 kg)   SpO2 100%   BMI 27.51 kg/m  Wt Readings from Last 3 Encounters:  05/06/16 165 lb 4.8 oz (75 kg)  04/28/16 175 lb 9.6 oz (79.7 kg)  03/22/16 174 lb (78.9 kg)

## 2016-05-09 ENCOUNTER — Ambulatory Visit
Admission: RE | Admit: 2016-05-09 | Discharge: 2016-05-09 | Disposition: A | Discharge status: Home / Self Care | Payer: Medicare Other | Source: Ambulatory Visit | Attending: Radiation Oncology | Admitting: Radiation Oncology

## 2016-05-09 DIAGNOSIS — Z510 Encounter for antineoplastic radiation therapy: Secondary | ICD-10-CM | POA: Diagnosis not present

## 2016-05-09 DIAGNOSIS — E785 Hyperlipidemia, unspecified: Secondary | ICD-10-CM | POA: Diagnosis not present

## 2016-05-09 DIAGNOSIS — N529 Male erectile dysfunction, unspecified: Secondary | ICD-10-CM | POA: Diagnosis not present

## 2016-05-09 DIAGNOSIS — C672 Malignant neoplasm of lateral wall of bladder: Secondary | ICD-10-CM | POA: Diagnosis not present

## 2016-05-09 DIAGNOSIS — M1990 Unspecified osteoarthritis, unspecified site: Secondary | ICD-10-CM | POA: Diagnosis not present

## 2016-05-09 DIAGNOSIS — I10 Essential (primary) hypertension: Secondary | ICD-10-CM | POA: Diagnosis not present

## 2016-05-10 ENCOUNTER — Encounter: Payer: Self-pay | Admitting: Radiation Oncology

## 2016-05-10 ENCOUNTER — Ambulatory Visit
Admission: RE | Admit: 2016-05-10 | Discharge: 2016-05-10 | Disposition: A | Discharge status: Home / Self Care | Payer: Medicare Other | Source: Ambulatory Visit | Attending: Radiation Oncology | Admitting: Radiation Oncology

## 2016-05-10 VITALS — BP 125/72 | HR 72 | Temp 97.6°F | Resp 18 | Ht 65.0 in | Wt 172.0 lb

## 2016-05-10 DIAGNOSIS — C672 Malignant neoplasm of lateral wall of bladder: Secondary | ICD-10-CM

## 2016-05-10 DIAGNOSIS — Z510 Encounter for antineoplastic radiation therapy: Secondary | ICD-10-CM | POA: Diagnosis not present

## 2016-05-10 DIAGNOSIS — E785 Hyperlipidemia, unspecified: Secondary | ICD-10-CM | POA: Diagnosis not present

## 2016-05-10 DIAGNOSIS — M1990 Unspecified osteoarthritis, unspecified site: Secondary | ICD-10-CM | POA: Diagnosis not present

## 2016-05-10 DIAGNOSIS — N529 Male erectile dysfunction, unspecified: Secondary | ICD-10-CM | POA: Diagnosis not present

## 2016-05-10 DIAGNOSIS — I10 Essential (primary) hypertension: Secondary | ICD-10-CM | POA: Diagnosis not present

## 2016-05-10 DIAGNOSIS — C61 Malignant neoplasm of prostate: Secondary | ICD-10-CM

## 2016-05-10 NOTE — Progress Notes (Signed)
  Radiation Oncology         [phone]   Name: Brian Montgomery MRN: [id number]   Date: 05/10/2016  DOB: 1935/11/05   Weekly Radiation Therapy Management    ICD-9-CM ICD-10-CM   1. Malignant neoplasm of lateral wall of urinary bladder (HCC) 188.2 C67.2   2. Malignant neoplasm of prostate (HCC) 185 C61     Current Dose: 19.8 Gy  Planned Dose:  75 Gy  Narrative The patient presents for routine under treatment assessment. Wife confirms he continues to take hydrocodone acetaminophen to relieve left arm pain. Denies hematuria or dysuria. Reports frequency urination. Reports occasional leakage. Reports he changes his incontinence pad approximately twice in a 24 hour period. Reports nocturia x 3-4. Denies diarrhea. Reports fatigue requiring afternoon naps.  The patient is without complaint. Set-up films were reviewed. The chart was checked.  Physical Findings  height is 5\' 5"  (1.651 m) and weight is 172 lb (78 kg). His oral temperature is 97.6 F (36.4 C). His blood pressure is 125/72 and his pulse is 72. His respiration is 18 and oxygen saturation is 100%. . Weight essentially stable.  No significant changes.  Impression The patient is tolerating radiation and concurrent chemo  Plan Continue treatment as planned.         Sheral Apley Tammi Klippel, M.D.

## 2016-05-10 NOTE — Progress Notes (Signed)
Vitals stable. Denies pain. Wife confirms he continues to take hydrocodone acetaminophen to relieve left arm pain. Denies hematuria or dysuria. Reports frequency urination. Reports occasional leakage. Reports he changes his incontinence pad approximately twice in a 24 hour period. Reports nocturia x 3-4. Denies diarrhea. Reports fatigue requiring afternoon naps.  Wt Readings from Last 3 Encounters:  05/10/16 172 lb (78 kg)  05/06/16 165 lb 4.8 oz (75 kg)  04/28/16 175 lb 9.6 oz (79.7 kg)  BP 125/72   Pulse 72   Temp 97.6 F (36.4 C) (Oral)   Resp 18   Ht 5\' 5"  (1.651 m)   Wt 172 lb (78 kg)   SpO2 100%   BMI 28.62 kg/m

## 2016-05-11 ENCOUNTER — Ambulatory Visit
Admission: RE | Admit: 2016-05-11 | Discharge: 2016-05-11 | Disposition: A | Discharge status: Home / Self Care | Payer: Medicare Other | Source: Ambulatory Visit | Attending: Radiation Oncology | Admitting: Radiation Oncology

## 2016-05-11 DIAGNOSIS — M1990 Unspecified osteoarthritis, unspecified site: Secondary | ICD-10-CM | POA: Diagnosis not present

## 2016-05-11 DIAGNOSIS — I10 Essential (primary) hypertension: Secondary | ICD-10-CM | POA: Diagnosis not present

## 2016-05-11 DIAGNOSIS — E785 Hyperlipidemia, unspecified: Secondary | ICD-10-CM | POA: Diagnosis not present

## 2016-05-11 DIAGNOSIS — C672 Malignant neoplasm of lateral wall of bladder: Secondary | ICD-10-CM | POA: Diagnosis not present

## 2016-05-11 DIAGNOSIS — N529 Male erectile dysfunction, unspecified: Secondary | ICD-10-CM | POA: Diagnosis not present

## 2016-05-11 DIAGNOSIS — Z510 Encounter for antineoplastic radiation therapy: Secondary | ICD-10-CM | POA: Diagnosis not present

## 2016-05-12 ENCOUNTER — Telehealth: Payer: Self-pay | Admitting: Oncology

## 2016-05-12 ENCOUNTER — Ambulatory Visit
Admission: RE | Admit: 2016-05-12 | Discharge: 2016-05-12 | Disposition: A | Discharge status: Home / Self Care | Payer: Medicare Other | Source: Ambulatory Visit | Attending: Radiation Oncology | Admitting: Radiation Oncology

## 2016-05-12 ENCOUNTER — Ambulatory Visit (HOSPITAL_BASED_OUTPATIENT_CLINIC_OR_DEPARTMENT_OTHER): Payer: Medicare Other | Admitting: Oncology

## 2016-05-12 ENCOUNTER — Other Ambulatory Visit (HOSPITAL_BASED_OUTPATIENT_CLINIC_OR_DEPARTMENT_OTHER): Payer: Medicare Other

## 2016-05-12 ENCOUNTER — Ambulatory Visit (HOSPITAL_BASED_OUTPATIENT_CLINIC_OR_DEPARTMENT_OTHER): Payer: Medicare Other

## 2016-05-12 VITALS — BP 100/60 | HR 73 | Temp 97.8°F | Resp 18 | Ht 65.0 in | Wt 171.4 lb

## 2016-05-12 DIAGNOSIS — C679 Malignant neoplasm of bladder, unspecified: Secondary | ICD-10-CM

## 2016-05-12 DIAGNOSIS — C672 Malignant neoplasm of lateral wall of bladder: Secondary | ICD-10-CM | POA: Diagnosis not present

## 2016-05-12 DIAGNOSIS — Z5111 Encounter for antineoplastic chemotherapy: Secondary | ICD-10-CM | POA: Diagnosis present

## 2016-05-12 DIAGNOSIS — M1990 Unspecified osteoarthritis, unspecified site: Secondary | ICD-10-CM | POA: Diagnosis not present

## 2016-05-12 DIAGNOSIS — I10 Essential (primary) hypertension: Secondary | ICD-10-CM | POA: Diagnosis not present

## 2016-05-12 DIAGNOSIS — N529 Male erectile dysfunction, unspecified: Secondary | ICD-10-CM | POA: Diagnosis not present

## 2016-05-12 DIAGNOSIS — Z510 Encounter for antineoplastic radiation therapy: Secondary | ICD-10-CM | POA: Diagnosis not present

## 2016-05-12 DIAGNOSIS — E785 Hyperlipidemia, unspecified: Secondary | ICD-10-CM | POA: Diagnosis not present

## 2016-05-12 LAB — CBC WITH DIFFERENTIAL/PLATELET
BASO%: 0.2 % (ref 0.0–2.0)
BASOS ABS: 0 10*3/uL (ref 0.0–0.1)
EOS%: 4.9 % (ref 0.0–7.0)
Eosinophils Absolute: 0.3 10*3/uL (ref 0.0–0.5)
HEMATOCRIT: 37 % — AB (ref 38.4–49.9)
HGB: 12.1 g/dL — ABNORMAL LOW (ref 13.0–17.1)
LYMPH#: 0.3 10*3/uL — AB (ref 0.9–3.3)
LYMPH%: 6.1 % — ABNORMAL LOW (ref 14.0–49.0)
MCH: 28.9 pg (ref 27.2–33.4)
MCHC: 32.8 g/dL (ref 32.0–36.0)
MCV: 88 fL (ref 79.3–98.0)
MONO#: 0.4 10*3/uL (ref 0.1–0.9)
MONO%: 7.4 % (ref 0.0–14.0)
NEUT#: 4.4 10*3/uL (ref 1.5–6.5)
NEUT%: 81.4 % — AB (ref 39.0–75.0)
PLATELETS: 145 10*3/uL (ref 140–400)
RBC: 4.2 10*6/uL (ref 4.20–5.82)
RDW: 14.1 % (ref 11.0–14.6)
WBC: 5.4 10*3/uL (ref 4.0–10.3)

## 2016-05-12 LAB — COMPREHENSIVE METABOLIC PANEL
ANION GAP: 7 meq/L (ref 3–11)
AST: 14 U/L (ref 5–34)
Albumin: 3.6 g/dL (ref 3.5–5.0)
Alkaline Phosphatase: 75 U/L (ref 40–150)
BUN: 23.7 mg/dL (ref 7.0–26.0)
CALCIUM: 9.5 mg/dL (ref 8.4–10.4)
CHLORIDE: 110 meq/L — AB (ref 98–109)
CO2: 23 mEq/L (ref 22–29)
CREATININE: 1.2 mg/dL (ref 0.7–1.3)
EGFR: 66 mL/min/{1.73_m2} — AB (ref >90)
Glucose: 108 mg/dl (ref 70–140)
POTASSIUM: 3.9 meq/L (ref 3.5–5.1)
Sodium: 140 mEq/L (ref 136–145)
Total Bilirubin: 0.68 mg/dL (ref 0.20–1.20)
Total Protein: 7.3 g/dL (ref 6.4–8.3)

## 2016-05-12 MED ORDER — DEXAMETHASONE SODIUM PHOSPHATE 10 MG/ML IJ SOLN
INTRAMUSCULAR | Status: AC
  Filled 2016-05-12: qty 1

## 2016-05-12 MED ORDER — DEXAMETHASONE SODIUM PHOSPHATE 10 MG/ML IJ SOLN
10.0000 mg | Freq: Once | INTRAMUSCULAR | Status: AC
  Administered 2016-05-12: 10 mg via INTRAVENOUS

## 2016-05-12 MED ORDER — SODIUM CHLORIDE 0.9 % IV SOLN
Freq: Once | INTRAVENOUS | Status: AC
  Administered 2016-05-12: 11:00:00 via INTRAVENOUS

## 2016-05-12 MED ORDER — CARBOPLATIN CHEMO INJECTION 450 MG/45ML
164.0000 mg | Freq: Once | INTRAVENOUS | Status: AC
  Administered 2016-05-12: 160 mg via INTRAVENOUS
  Filled 2016-05-12: qty 16

## 2016-05-12 MED ORDER — PALONOSETRON HCL INJECTION 0.25 MG/5ML
0.2500 mg | Freq: Once | INTRAVENOUS | Status: AC
  Administered 2016-05-12: 0.25 mg via INTRAVENOUS

## 2016-05-12 MED ORDER — PALONOSETRON HCL INJECTION 0.25 MG/5ML
INTRAVENOUS | Status: AC
  Filled 2016-05-12: qty 5

## 2016-05-12 NOTE — Progress Notes (Signed)
Hematology and Oncology Follow Up Visit  Brian Montgomery [id number] [birth year] 80 y.o. 05/12/2016 9:30 AM Brian Montgomery, MDLalonde, Brian Jarvis, MD   Principle Diagnosis: 80 year old with muscle invasive high-grade papillary urothelial carcinoma diagnosed in July 2017. He had a T2 N0 disease presenting with a 6 cm mass on 01/21/2016.   Prior Therapy: Status post TURBT on 01/21/2016.  Current therapy: Definitive radiation therapy with weekly carboplatin started on 04/28/2016.  Interim History: [name] presents today for a follow-up visit.  Since his last discharge, he started that definitive radiation therapy and received 2 weekly carboplatin chemotherapy. He tolerated it well without any major complications. He denied any nausea, vomiting or abdominal pain. He does report bladder irritation and frequency which has not changed dramatically. His appetite remain excellent and have gained weight. He denied any peripheral neuropathy. He denied any hematuria or dysuria. He denied any flank pain.   He does not report any headaches, blurry vision, double vision, syncope or seizures. He does not report any fevers, chills or sweats. He does not report any cough, wheezing or hemoptysis. He does not report any nausea, vomiting or abdominal pain. He does not report any frequency, hesitancy or urgency. He does not report any skeletal complaints. Remaining review of systems unremarkable.    Medications: I have reviewed the patient's current medications.  Current Outpatient Prescriptions  Medication Sig Dispense Refill  . carbidopa-levodopa (SINEMET IR) 25-100 MG tablet TAKE 1/2 TABLET BY MOUTH TWICE DAILY FOR 3 DAYS, 1/2 TABLET THREE TIMES DAILY FOR 3 DAYS, THEN 1 TABLET THREE TIMES DAILY 90 tablet 3  . donepezil (ARICEPT) 5 MG tablet Take 1 tablet (5 mg total) by mouth at bedtime. 90 tablet 1  . HYDROcodone-acetaminophen (NORCO/VICODIN) 5-325 MG tablet Take 1-2 tablets by mouth every 6 (six) hours as  needed. 60 tablet 0  . lisinopril-hydrochlorothiazide (PRINZIDE,ZESTORETIC) 10-12.5 MG tablet Take 1 tablet by mouth daily. 90 tablet 3  . phenazopyridine (PYRIDIUM) 100 MG tablet Take 1 tablet (100 mg total) by mouth 3 (three) times daily as needed for pain (for burning). 20 tablet 0  . prochlorperazine (COMPAZINE) 10 MG tablet Take 1 tablet (10 mg total) by mouth every 6 (six) hours as needed for nausea or vomiting. 30 tablet 0  . simvastatin (ZOCOR) 20 MG tablet TAKE 1 TABLET BY MOUTH DAILY 90 tablet 0   No current facility-administered medications for this visit.      Allergies: No Known Allergies  Past Medical History, Surgical history, Social history, and Family History were reviewed and updated.   Physical Exam: Blood pressure 100/60, pulse 73, temperature 97.8 F (36.6 C), temperature source Oral, resp. rate 18, height 5\' 5"  (1.651 m), weight 171 lb 6.4 oz (77.7 kg), SpO2 100 %. ECOG: 1 General appearance: Alert, awake gentleman without distress. Head: Normocephalic, without obvious abnormality no oral ulcers or lesions. Neck: no adenopathy Lymph nodes: Cervical, supraclavicular, and axillary nodes normal. Heart:regular rate and rhythm, S1, S2 normal, no murmur, click, rub or gallop Lung:chest clear, no wheezing, rales, normal symmetric air entry.  Abdomin: soft, non-tender, without masses or organomegaly. No shifting dullness or ascites. EXT:no erythema, induration, or nodules   Lab Results: Lab Results  Component Value Date   WBC 5.4 05/12/2016   HGB 12.1 (L) 05/12/2016   HCT 37.0 (L) 05/12/2016   MCV 88.0 05/12/2016   PLT 145 05/12/2016     Chemistry      Component Value Date/Time   NA 141 05/05/2016 1245   K 3.8  05/05/2016 1245   CL 105 03/22/2016 1154   CO2 22 05/05/2016 1245   BUN 19.5 05/05/2016 1245   CREATININE 1.1 05/05/2016 1245      Component Value Date/Time   CALCIUM 9.7 05/05/2016 1245   ALKPHOS 84 05/05/2016 1245   AST 16 05/05/2016 1245    ALT <6 05/05/2016 1245   BILITOT 0.84 05/05/2016 1245       Impression and Plan:  80 year old gentleman with the following issues:  1. Muscle invasive high-grade papillary urothelial carcinoma of the bladder diagnosed in July 2017. He presented with hematuria and imaging studies revealed a bladder mass. He is status post TURBT on 01/21/2016 and the tumor showed it to be 6 cm mass invading into muscle wall with clinical staging at least T2 N0.  He is currently receiving definitive radiation therapy with weekly carboplatin with the first week of treatment was 04/28/2016. He tolerated therapy well and ready to proceed with week 3 of therapy. He is ready to proceed after discussing the risks and benefits.  2. IV access: Peripheral veins will be adequate at this time unless his veins become poor during treatment and Port-A-Cath will be contemplated.  3. Antiemetics: Prescription for Compazine made available to the patient. No nausea has reported.  4. Renal dysfunction: His creatinine remains within normal range.  5. Follow-up: Will be in one week for the next cycle of chemotherapy.    [id number], MD 11/2/20179:30 AM

## 2016-05-12 NOTE — Patient Instructions (Signed)
[facility] Discharge Instructions for Patients Receiving Chemotherapy  Today you received the following chemotherapy agents: Carboplatin   To help prevent nausea and vomiting after your treatment, we encourage you to take your nausea medication as directed.    If you develop nausea and vomiting that is not controlled by your nausea medication, call the clinic.   BELOW ARE SYMPTOMS THAT SHOULD BE REPORTED IMMEDIATELY:  *FEVER GREATER THAN 100.5 F  *CHILLS WITH OR WITHOUT FEVER  NAUSEA AND VOMITING THAT IS NOT CONTROLLED WITH YOUR NAUSEA MEDICATION  *UNUSUAL SHORTNESS OF BREATH  *UNUSUAL BRUISING OR BLEEDING  TENDERNESS IN MOUTH AND THROAT WITH OR WITHOUT PRESENCE OF ULCERS  *URINARY PROBLEMS  *BOWEL PROBLEMS  UNUSUAL RASH Items with * indicate a potential emergency and should be followed up as soon as possible.  Feel free to call the clinic you have any questions or concerns. The clinic phone number is [phone].  Please show the CHEMO ALERT CARD at check-in to the Emergency Department and triage nurse.   

## 2016-05-12 NOTE — Telephone Encounter (Signed)
Appointments scheduled per 05/12/16 los. AVS report and appointment schedule given to patient, per 05/12/16 los. °

## 2016-05-13 ENCOUNTER — Ambulatory Visit
Admission: RE | Admit: 2016-05-13 | Discharge: 2016-05-13 | Disposition: A | Discharge status: Home / Self Care | Payer: Medicare Other | Source: Ambulatory Visit | Attending: Radiation Oncology | Admitting: Radiation Oncology

## 2016-05-13 DIAGNOSIS — C672 Malignant neoplasm of lateral wall of bladder: Secondary | ICD-10-CM | POA: Diagnosis not present

## 2016-05-13 DIAGNOSIS — E785 Hyperlipidemia, unspecified: Secondary | ICD-10-CM | POA: Diagnosis not present

## 2016-05-13 DIAGNOSIS — Z510 Encounter for antineoplastic radiation therapy: Secondary | ICD-10-CM | POA: Diagnosis not present

## 2016-05-13 DIAGNOSIS — M1990 Unspecified osteoarthritis, unspecified site: Secondary | ICD-10-CM | POA: Diagnosis not present

## 2016-05-13 DIAGNOSIS — I10 Essential (primary) hypertension: Secondary | ICD-10-CM | POA: Diagnosis not present

## 2016-05-13 DIAGNOSIS — N529 Male erectile dysfunction, unspecified: Secondary | ICD-10-CM | POA: Diagnosis not present

## 2016-05-16 ENCOUNTER — Ambulatory Visit
Admission: RE | Admit: 2016-05-16 | Discharge: 2016-05-16 | Disposition: A | Discharge status: Home / Self Care | Payer: Medicare Other | Source: Ambulatory Visit | Attending: Radiation Oncology | Admitting: Radiation Oncology

## 2016-05-16 DIAGNOSIS — N529 Male erectile dysfunction, unspecified: Secondary | ICD-10-CM | POA: Diagnosis not present

## 2016-05-16 DIAGNOSIS — M1990 Unspecified osteoarthritis, unspecified site: Secondary | ICD-10-CM | POA: Diagnosis not present

## 2016-05-16 DIAGNOSIS — I10 Essential (primary) hypertension: Secondary | ICD-10-CM | POA: Diagnosis not present

## 2016-05-16 DIAGNOSIS — Z510 Encounter for antineoplastic radiation therapy: Secondary | ICD-10-CM | POA: Diagnosis not present

## 2016-05-16 DIAGNOSIS — C672 Malignant neoplasm of lateral wall of bladder: Secondary | ICD-10-CM | POA: Diagnosis not present

## 2016-05-16 DIAGNOSIS — E785 Hyperlipidemia, unspecified: Secondary | ICD-10-CM | POA: Diagnosis not present

## 2016-05-17 ENCOUNTER — Encounter: Payer: Self-pay | Admitting: Medical Oncology

## 2016-05-17 ENCOUNTER — Ambulatory Visit
Admission: RE | Admit: 2016-05-17 | Discharge: 2016-05-17 | Disposition: A | Discharge status: Home / Self Care | Payer: Medicare Other | Source: Ambulatory Visit | Attending: Radiation Oncology | Admitting: Radiation Oncology

## 2016-05-17 ENCOUNTER — Ambulatory Visit: Payer: Medicare Other | Admitting: Oncology

## 2016-05-17 ENCOUNTER — Other Ambulatory Visit: Payer: Medicare Other

## 2016-05-17 DIAGNOSIS — C672 Malignant neoplasm of lateral wall of bladder: Secondary | ICD-10-CM | POA: Diagnosis not present

## 2016-05-17 DIAGNOSIS — N529 Male erectile dysfunction, unspecified: Secondary | ICD-10-CM | POA: Diagnosis not present

## 2016-05-17 DIAGNOSIS — E785 Hyperlipidemia, unspecified: Secondary | ICD-10-CM | POA: Diagnosis not present

## 2016-05-17 DIAGNOSIS — I10 Essential (primary) hypertension: Secondary | ICD-10-CM | POA: Diagnosis not present

## 2016-05-17 DIAGNOSIS — Z510 Encounter for antineoplastic radiation therapy: Secondary | ICD-10-CM | POA: Diagnosis not present

## 2016-05-17 DIAGNOSIS — M1990 Unspecified osteoarthritis, unspecified site: Secondary | ICD-10-CM | POA: Diagnosis not present

## 2016-05-18 ENCOUNTER — Ambulatory Visit
Admission: RE | Admit: 2016-05-18 | Discharge: 2016-05-18 | Disposition: A | Discharge status: Home / Self Care | Payer: Medicare Other | Source: Ambulatory Visit | Attending: Radiation Oncology | Admitting: Radiation Oncology

## 2016-05-18 DIAGNOSIS — M1990 Unspecified osteoarthritis, unspecified site: Secondary | ICD-10-CM | POA: Diagnosis not present

## 2016-05-18 DIAGNOSIS — C672 Malignant neoplasm of lateral wall of bladder: Secondary | ICD-10-CM | POA: Diagnosis not present

## 2016-05-18 DIAGNOSIS — N529 Male erectile dysfunction, unspecified: Secondary | ICD-10-CM | POA: Diagnosis not present

## 2016-05-18 DIAGNOSIS — I10 Essential (primary) hypertension: Secondary | ICD-10-CM | POA: Diagnosis not present

## 2016-05-18 DIAGNOSIS — Z510 Encounter for antineoplastic radiation therapy: Secondary | ICD-10-CM | POA: Diagnosis not present

## 2016-05-18 DIAGNOSIS — E785 Hyperlipidemia, unspecified: Secondary | ICD-10-CM | POA: Diagnosis not present

## 2016-05-19 ENCOUNTER — Ambulatory Visit
Admission: RE | Admit: 2016-05-19 | Discharge: 2016-05-19 | Disposition: A | Discharge status: Home / Self Care | Payer: Medicare Other | Source: Ambulatory Visit | Attending: Radiation Oncology | Admitting: Radiation Oncology

## 2016-05-19 ENCOUNTER — Encounter: Payer: Self-pay | Admitting: Radiation Oncology

## 2016-05-19 ENCOUNTER — Other Ambulatory Visit (HOSPITAL_BASED_OUTPATIENT_CLINIC_OR_DEPARTMENT_OTHER): Payer: Medicare Other

## 2016-05-19 ENCOUNTER — Ambulatory Visit (HOSPITAL_BASED_OUTPATIENT_CLINIC_OR_DEPARTMENT_OTHER): Payer: Medicare Other

## 2016-05-19 VITALS — BP 124/64 | HR 70 | Temp 97.8°F | Resp 18 | Ht 65.0 in | Wt 170.8 lb

## 2016-05-19 VITALS — BP 126/61 | HR 64 | Temp 97.9°F | Resp 17

## 2016-05-19 DIAGNOSIS — Z5111 Encounter for antineoplastic chemotherapy: Secondary | ICD-10-CM

## 2016-05-19 DIAGNOSIS — N529 Male erectile dysfunction, unspecified: Secondary | ICD-10-CM | POA: Diagnosis not present

## 2016-05-19 DIAGNOSIS — C679 Malignant neoplasm of bladder, unspecified: Secondary | ICD-10-CM

## 2016-05-19 DIAGNOSIS — Z510 Encounter for antineoplastic radiation therapy: Secondary | ICD-10-CM | POA: Diagnosis not present

## 2016-05-19 DIAGNOSIS — C672 Malignant neoplasm of lateral wall of bladder: Secondary | ICD-10-CM | POA: Diagnosis not present

## 2016-05-19 DIAGNOSIS — E785 Hyperlipidemia, unspecified: Secondary | ICD-10-CM | POA: Diagnosis not present

## 2016-05-19 DIAGNOSIS — M1990 Unspecified osteoarthritis, unspecified site: Secondary | ICD-10-CM | POA: Diagnosis not present

## 2016-05-19 DIAGNOSIS — C61 Malignant neoplasm of prostate: Secondary | ICD-10-CM

## 2016-05-19 DIAGNOSIS — I10 Essential (primary) hypertension: Secondary | ICD-10-CM | POA: Diagnosis not present

## 2016-05-19 LAB — CBC WITH DIFFERENTIAL/PLATELET
BASO%: 0.4 % (ref 0.0–2.0)
Basophils Absolute: 0 10*3/uL (ref 0.0–0.1)
EOS%: 8.8 % — ABNORMAL HIGH (ref 0.0–7.0)
Eosinophils Absolute: 0.4 10*3/uL (ref 0.0–0.5)
HCT: 36.8 % — ABNORMAL LOW (ref 38.4–49.9)
HGB: 12.6 g/dL — ABNORMAL LOW (ref 13.0–17.1)
LYMPH#: 0.3 10*3/uL — AB (ref 0.9–3.3)
LYMPH%: 6.6 % — AB (ref 14.0–49.0)
MCH: 29.3 pg (ref 27.2–33.4)
MCHC: 34.2 g/dL (ref 32.0–36.0)
MCV: 85.6 fL (ref 79.3–98.0)
MONO#: 0.3 10*3/uL (ref 0.1–0.9)
MONO%: 5.5 % (ref 0.0–14.0)
NEUT%: 78.7 % — AB (ref 39.0–75.0)
NEUTROS ABS: 3.8 10*3/uL (ref 1.5–6.5)
PLATELETS: 127 10*3/uL — AB (ref 140–400)
RBC: 4.3 10*6/uL (ref 4.20–5.82)
RDW: 14.3 % (ref 11.0–14.6)
WBC: 4.9 10*3/uL (ref 4.0–10.3)

## 2016-05-19 LAB — COMPREHENSIVE METABOLIC PANEL
ANION GAP: 10 meq/L (ref 3–11)
AST: 14 U/L (ref 5–34)
Albumin: 3.5 g/dL (ref 3.5–5.0)
Alkaline Phosphatase: 79 U/L (ref 40–150)
BILIRUBIN TOTAL: 0.67 mg/dL (ref 0.20–1.20)
BUN: 21.3 mg/dL (ref 7.0–26.0)
CHLORIDE: 109 meq/L (ref 98–109)
CO2: 21 meq/L — AB (ref 22–29)
CREATININE: 1.2 mg/dL (ref 0.7–1.3)
Calcium: 9.7 mg/dL (ref 8.4–10.4)
EGFR: 67 mL/min/{1.73_m2} — ABNORMAL LOW (ref >90)
GLUCOSE: 90 mg/dL (ref 70–140)
Potassium: 3.7 mEq/L (ref 3.5–5.1)
SODIUM: 141 meq/L (ref 136–145)
TOTAL PROTEIN: 7.5 g/dL (ref 6.4–8.3)

## 2016-05-19 MED ORDER — DEXAMETHASONE SODIUM PHOSPHATE 10 MG/ML IJ SOLN
10.0000 mg | Freq: Once | INTRAMUSCULAR | Status: AC
  Administered 2016-05-19: 10 mg via INTRAVENOUS

## 2016-05-19 MED ORDER — PALONOSETRON HCL INJECTION 0.25 MG/5ML
INTRAVENOUS | Status: AC
  Filled 2016-05-19: qty 5

## 2016-05-19 MED ORDER — SODIUM CHLORIDE 0.9 % IV SOLN
Freq: Once | INTRAVENOUS | Status: AC
  Administered 2016-05-19: 12:00:00 via INTRAVENOUS

## 2016-05-19 MED ORDER — DEXAMETHASONE SODIUM PHOSPHATE 10 MG/ML IJ SOLN
INTRAMUSCULAR | Status: AC
  Filled 2016-05-19: qty 1

## 2016-05-19 MED ORDER — PALONOSETRON HCL INJECTION 0.25 MG/5ML
0.2500 mg | Freq: Once | INTRAVENOUS | Status: AC
  Administered 2016-05-19: 0.25 mg via INTRAVENOUS

## 2016-05-19 MED ORDER — SODIUM CHLORIDE 0.9 % IV SOLN
164.0000 mg | Freq: Once | INTRAVENOUS | Status: AC
  Administered 2016-05-19: 160 mg via INTRAVENOUS
  Filled 2016-05-19: qty 16

## 2016-05-19 NOTE — Patient Instructions (Signed)
[facility] Discharge Instructions for Patients Receiving Chemotherapy  Today you received the following chemotherapy agents: Carboplatin   To help prevent nausea and vomiting after your treatment, we encourage you to take your nausea medication as directed.    If you develop nausea and vomiting that is not controlled by your nausea medication, call the clinic.   BELOW ARE SYMPTOMS THAT SHOULD BE REPORTED IMMEDIATELY:  *FEVER GREATER THAN 100.5 F  *CHILLS WITH OR WITHOUT FEVER  NAUSEA AND VOMITING THAT IS NOT CONTROLLED WITH YOUR NAUSEA MEDICATION  *UNUSUAL SHORTNESS OF BREATH  *UNUSUAL BRUISING OR BLEEDING  TENDERNESS IN MOUTH AND THROAT WITH OR WITHOUT PRESENCE OF ULCERS  *URINARY PROBLEMS  *BOWEL PROBLEMS  UNUSUAL RASH Items with * indicate a potential emergency and should be followed up as soon as possible.  Feel free to call the clinic you have any questions or concerns. The clinic phone number is [phone].  Please show the CHEMO ALERT CARD at check-in to the Emergency Department and triage nurse.   

## 2016-05-19 NOTE — Progress Notes (Signed)
Weight and vitals stable. Denies pain. Daughter confirms he continues to take hydrocodone acetaminophen to relieve left arm pain. Denies hematuria or dysuria. Reports urinary frequency. Reports occasional urinary leakage. Reports he changes his incontinence pad approximately twice in a 24 hour period. Reports nocturia x 3-4. Denies diarrhea. Reports fatigue requiring afternoon naps.  Wt Readings from Last 3 Encounters:  05/19/16 170 lb 12.8 oz (77.5 kg)  05/12/16 171 lb 6.4 oz (77.7 kg)  05/10/16 172 lb (78 kg)  BP 124/64   Pulse 70   Temp 97.8 F (36.6 C) (Oral)   Resp 18   Ht 5\' 5"  (1.651 m)   Wt 170 lb 12.8 oz (77.5 kg)   SpO2 100%   BMI 28.42 kg/m

## 2016-05-19 NOTE — Progress Notes (Signed)
  Radiation Oncology         [phone]   Name: Brian Montgomery MRN: [id number]   Date: 05/19/2016  DOB: 09-29-1935     Weekly Radiation Therapy Management    ICD-9-CM ICD-10-CM   1. Malignant neoplasm of prostate ([place]) 185 C61     Current Dose: 32.4 Gy  Planned Dose:  45 Gy  Narrative The patient presents for routine under treatment assessment.  Weight and vitals stable. Denies pain. Daughter confirms he continues to take hydrocodone acetaminophen to relieve left arm pain. Denies hematuria or dysuria. Reports urinary frequency. Reports occasional urinary leakage. Reports he changes his incontinence pad approximately twice in a 24 hour period. Reports nocturia x 3-4. Denies diarrhea. Reports fatigue requiring afternoon naps. He will receive chemotherapy later today.   Set-up films were reviewed. The chart was checked.  Physical Findings  height is 5\' 5"  (1.651 m) and weight is 170 lb 12.8 oz (77.5 kg). His oral temperature is 97.8 F (36.6 C). His blood pressure is 124/64 and his pulse is 70. His respiration is 18 and oxygen saturation is 100%. . Weight essentially stable.  No significant changes. Presents in wheelchair.  Impression The patient is tolerating radiation.  Plan Continue treatment as planned.       Sheral Apley Tammi Klippel, M.D.  This document serves as a record of services personally performed by Tyler Pita, MD and Shona Simpson, PA-C. It was created on his behalf by Arlyce Harman, a trained medical scribe. The creation of this record is based on the scribe's personal observations and the provider's statements to them. This document has been checked and approved by the attending provider.

## 2016-05-20 ENCOUNTER — Ambulatory Visit
Admission: RE | Admit: 2016-05-20 | Discharge: 2016-05-20 | Disposition: A | Discharge status: Home / Self Care | Payer: Medicare Other | Source: Ambulatory Visit | Attending: Radiation Oncology | Admitting: Radiation Oncology

## 2016-05-20 DIAGNOSIS — Z510 Encounter for antineoplastic radiation therapy: Secondary | ICD-10-CM | POA: Diagnosis not present

## 2016-05-20 DIAGNOSIS — E785 Hyperlipidemia, unspecified: Secondary | ICD-10-CM | POA: Diagnosis not present

## 2016-05-20 DIAGNOSIS — I10 Essential (primary) hypertension: Secondary | ICD-10-CM | POA: Diagnosis not present

## 2016-05-20 DIAGNOSIS — N529 Male erectile dysfunction, unspecified: Secondary | ICD-10-CM | POA: Diagnosis not present

## 2016-05-20 DIAGNOSIS — M1990 Unspecified osteoarthritis, unspecified site: Secondary | ICD-10-CM | POA: Diagnosis not present

## 2016-05-20 DIAGNOSIS — C672 Malignant neoplasm of lateral wall of bladder: Secondary | ICD-10-CM | POA: Diagnosis not present

## 2016-05-20 NOTE — Addendum Note (Signed)
Encounter addended by: Malena Edman, RN on: 05/20/2016  2:45 PM<BR>    Actions taken: Order Reconciliation Section accessed

## 2016-05-23 ENCOUNTER — Ambulatory Visit
Admission: RE | Admit: 2016-05-23 | Discharge: 2016-05-23 | Disposition: A | Discharge status: Home / Self Care | Payer: Medicare Other | Source: Ambulatory Visit | Attending: Radiation Oncology | Admitting: Radiation Oncology

## 2016-05-23 DIAGNOSIS — C672 Malignant neoplasm of lateral wall of bladder: Secondary | ICD-10-CM | POA: Diagnosis not present

## 2016-05-23 DIAGNOSIS — I10 Essential (primary) hypertension: Secondary | ICD-10-CM | POA: Diagnosis not present

## 2016-05-23 DIAGNOSIS — N529 Male erectile dysfunction, unspecified: Secondary | ICD-10-CM | POA: Diagnosis not present

## 2016-05-23 DIAGNOSIS — E785 Hyperlipidemia, unspecified: Secondary | ICD-10-CM | POA: Diagnosis not present

## 2016-05-23 DIAGNOSIS — M1990 Unspecified osteoarthritis, unspecified site: Secondary | ICD-10-CM | POA: Diagnosis not present

## 2016-05-23 DIAGNOSIS — Z510 Encounter for antineoplastic radiation therapy: Secondary | ICD-10-CM | POA: Diagnosis not present

## 2016-05-24 ENCOUNTER — Ambulatory Visit
Admission: RE | Admit: 2016-05-24 | Discharge: 2016-05-24 | Disposition: A | Discharge status: Home / Self Care | Payer: Medicare Other | Source: Ambulatory Visit | Attending: Radiation Oncology | Admitting: Radiation Oncology

## 2016-05-24 DIAGNOSIS — E785 Hyperlipidemia, unspecified: Secondary | ICD-10-CM | POA: Diagnosis not present

## 2016-05-24 DIAGNOSIS — Z510 Encounter for antineoplastic radiation therapy: Secondary | ICD-10-CM | POA: Diagnosis not present

## 2016-05-24 DIAGNOSIS — C672 Malignant neoplasm of lateral wall of bladder: Secondary | ICD-10-CM | POA: Diagnosis not present

## 2016-05-24 DIAGNOSIS — I10 Essential (primary) hypertension: Secondary | ICD-10-CM | POA: Diagnosis not present

## 2016-05-24 DIAGNOSIS — N529 Male erectile dysfunction, unspecified: Secondary | ICD-10-CM | POA: Diagnosis not present

## 2016-05-24 DIAGNOSIS — M1990 Unspecified osteoarthritis, unspecified site: Secondary | ICD-10-CM | POA: Diagnosis not present

## 2016-05-25 ENCOUNTER — Ambulatory Visit
Admission: RE | Admit: 2016-05-25 | Discharge: 2016-05-25 | Disposition: A | Discharge status: Home / Self Care | Payer: Medicare Other | Source: Ambulatory Visit | Attending: Radiation Oncology | Admitting: Radiation Oncology

## 2016-05-25 DIAGNOSIS — Z510 Encounter for antineoplastic radiation therapy: Secondary | ICD-10-CM | POA: Diagnosis not present

## 2016-05-25 DIAGNOSIS — C672 Malignant neoplasm of lateral wall of bladder: Secondary | ICD-10-CM | POA: Diagnosis not present

## 2016-05-25 DIAGNOSIS — I10 Essential (primary) hypertension: Secondary | ICD-10-CM | POA: Diagnosis not present

## 2016-05-25 DIAGNOSIS — M1990 Unspecified osteoarthritis, unspecified site: Secondary | ICD-10-CM | POA: Diagnosis not present

## 2016-05-25 DIAGNOSIS — E785 Hyperlipidemia, unspecified: Secondary | ICD-10-CM | POA: Diagnosis not present

## 2016-05-25 DIAGNOSIS — N529 Male erectile dysfunction, unspecified: Secondary | ICD-10-CM | POA: Diagnosis not present

## 2016-05-26 ENCOUNTER — Ambulatory Visit
Admission: RE | Admit: 2016-05-26 | Discharge: 2016-05-26 | Disposition: A | Discharge status: Home / Self Care | Payer: Medicare Other | Source: Ambulatory Visit | Attending: Radiation Oncology | Admitting: Radiation Oncology

## 2016-05-26 ENCOUNTER — Other Ambulatory Visit (HOSPITAL_BASED_OUTPATIENT_CLINIC_OR_DEPARTMENT_OTHER): Payer: Medicare Other

## 2016-05-26 ENCOUNTER — Ambulatory Visit (HOSPITAL_BASED_OUTPATIENT_CLINIC_OR_DEPARTMENT_OTHER): Payer: Medicare Other

## 2016-05-26 VITALS — BP 120/59 | HR 75 | Resp 16 | Wt 173.0 lb

## 2016-05-26 DIAGNOSIS — Z5111 Encounter for antineoplastic chemotherapy: Secondary | ICD-10-CM

## 2016-05-26 DIAGNOSIS — I10 Essential (primary) hypertension: Secondary | ICD-10-CM | POA: Diagnosis not present

## 2016-05-26 DIAGNOSIS — C672 Malignant neoplasm of lateral wall of bladder: Secondary | ICD-10-CM | POA: Diagnosis not present

## 2016-05-26 DIAGNOSIS — C679 Malignant neoplasm of bladder, unspecified: Secondary | ICD-10-CM | POA: Diagnosis present

## 2016-05-26 DIAGNOSIS — Z510 Encounter for antineoplastic radiation therapy: Secondary | ICD-10-CM | POA: Diagnosis not present

## 2016-05-26 DIAGNOSIS — M1990 Unspecified osteoarthritis, unspecified site: Secondary | ICD-10-CM | POA: Diagnosis not present

## 2016-05-26 DIAGNOSIS — E785 Hyperlipidemia, unspecified: Secondary | ICD-10-CM | POA: Diagnosis not present

## 2016-05-26 DIAGNOSIS — N529 Male erectile dysfunction, unspecified: Secondary | ICD-10-CM | POA: Diagnosis not present

## 2016-05-26 LAB — CBC WITH DIFFERENTIAL/PLATELET
BASO%: 0.2 % (ref 0.0–2.0)
Basophils Absolute: 0 10*3/uL (ref 0.0–0.1)
EOS%: 5.7 % (ref 0.0–7.0)
Eosinophils Absolute: 0.2 10*3/uL (ref 0.0–0.5)
HCT: 34.9 % — ABNORMAL LOW (ref 38.4–49.9)
HGB: 11.8 g/dL — ABNORMAL LOW (ref 13.0–17.1)
LYMPH%: 6.9 % — AB (ref 14.0–49.0)
MCH: 29 pg (ref 27.2–33.4)
MCHC: 33.8 g/dL (ref 32.0–36.0)
MCV: 85.7 fL (ref 79.3–98.0)
MONO#: 0.2 10*3/uL (ref 0.1–0.9)
MONO%: 4.9 % (ref 0.0–14.0)
NEUT#: 3.3 10*3/uL (ref 1.5–6.5)
NEUT%: 82.3 % — ABNORMAL HIGH (ref 39.0–75.0)
Platelets: 116 10*3/uL — ABNORMAL LOW (ref 140–400)
RBC: 4.07 10*6/uL — AB (ref 4.20–5.82)
RDW: 14.7 % — ABNORMAL HIGH (ref 11.0–14.6)
WBC: 4.1 10*3/uL (ref 4.0–10.3)
lymph#: 0.3 10*3/uL — ABNORMAL LOW (ref 0.9–3.3)

## 2016-05-26 LAB — COMPREHENSIVE METABOLIC PANEL
ALT: 6 U/L (ref 0–55)
AST: 15 U/L (ref 5–34)
Albumin: 3.5 g/dL (ref 3.5–5.0)
Alkaline Phosphatase: 77 U/L (ref 40–150)
Anion Gap: 10 mEq/L (ref 3–11)
BILIRUBIN TOTAL: 0.56 mg/dL (ref 0.20–1.20)
BUN: 22.4 mg/dL (ref 7.0–26.0)
CHLORIDE: 108 meq/L (ref 98–109)
CO2: 22 meq/L (ref 22–29)
Calcium: 9.7 mg/dL (ref 8.4–10.4)
Creatinine: 1.1 mg/dL (ref 0.7–1.3)
EGFR: 71 mL/min/{1.73_m2} — AB (ref >90)
GLUCOSE: 84 mg/dL (ref 70–140)
Potassium: 3.8 mEq/L (ref 3.5–5.1)
SODIUM: 140 meq/L (ref 136–145)
TOTAL PROTEIN: 7.2 g/dL (ref 6.4–8.3)

## 2016-05-26 MED ORDER — DEXAMETHASONE SODIUM PHOSPHATE 10 MG/ML IJ SOLN
INTRAMUSCULAR | Status: AC
  Filled 2016-05-26: qty 1

## 2016-05-26 MED ORDER — SODIUM CHLORIDE 0.9 % IV SOLN
174.4000 mg | Freq: Once | INTRAVENOUS | Status: AC
  Administered 2016-05-26: 170 mg via INTRAVENOUS
  Filled 2016-05-26: qty 17

## 2016-05-26 MED ORDER — PALONOSETRON HCL INJECTION 0.25 MG/5ML
0.2500 mg | Freq: Once | INTRAVENOUS | Status: AC
  Administered 2016-05-26: 0.25 mg via INTRAVENOUS

## 2016-05-26 MED ORDER — DEXAMETHASONE SODIUM PHOSPHATE 10 MG/ML IJ SOLN
10.0000 mg | Freq: Once | INTRAMUSCULAR | Status: AC
  Administered 2016-05-26: 10 mg via INTRAVENOUS

## 2016-05-26 MED ORDER — SODIUM CHLORIDE 0.9 % IV SOLN
Freq: Once | INTRAVENOUS | Status: AC
  Administered 2016-05-26: 12:00:00 via INTRAVENOUS

## 2016-05-26 NOTE — Progress Notes (Signed)
  Radiation Oncology         [phone]   Name: Brian Montgomery MRN: [id number]   Date: 05/26/2016  DOB: 01-21-1936     Weekly Radiation Therapy Management    ICD-9-CM ICD-10-CM   1. Malignant neoplasm of urinary bladder, unspecified site (HCC) 188.9 C67.9     Current Dose: 41.4 Gy  Planned Dose:  45 Gy  Narrative The patient presents for routine under treatment assessment.  Denies pain, hematuria, dysuria, or diarrhea. Reports urinary frequency, occasional urinary leakage, continues to change incontinence pad twice per 24 hour period, and nocturia x 3-4. Reports his energy level is unchanged. Wife states she stops giving him water after 6PM. The patient is on HCTZ.  Set-up films were reviewed. The chart was checked.  Physical Findings  weight is 173 lb (78.5 kg). His blood pressure is 120/59 (abnormal) and his pulse is 75. His respiration is 16 and oxygen saturation is 100%. . Weight essentially stable.  No significant changes. Presents in wheelchair.  Impression The patient is tolerating radiation. I discussed Flomax with the patient, but he and his wife want to hold off on it for now.  Plan Continue treatment as planned.       Sheral Apley Tammi Klippel, M.D.  This document serves as a record of services personally performed by Tyler Pita, MD. It was created on his behalf by Darcus Austin, a trained medical scribe. The creation of this record is based on the scribe's personal observations and the provider's statements to them. This document has been checked and approved by the attending provider.

## 2016-05-26 NOTE — Patient Instructions (Signed)
[facility] Discharge Instructions for Patients Receiving Chemotherapy  Today you received the following chemotherapy agents: Carboplatin   To help prevent nausea and vomiting after your treatment, we encourage you to take your nausea medication as directed.    If you develop nausea and vomiting that is not controlled by your nausea medication, call the clinic.   BELOW ARE SYMPTOMS THAT SHOULD BE REPORTED IMMEDIATELY:  *FEVER GREATER THAN 100.5 F  *CHILLS WITH OR WITHOUT FEVER  NAUSEA AND VOMITING THAT IS NOT CONTROLLED WITH YOUR NAUSEA MEDICATION  *UNUSUAL SHORTNESS OF BREATH  *UNUSUAL BRUISING OR BLEEDING  TENDERNESS IN MOUTH AND THROAT WITH OR WITHOUT PRESENCE OF ULCERS  *URINARY PROBLEMS  *BOWEL PROBLEMS  UNUSUAL RASH Items with * indicate a potential emergency and should be followed up as soon as possible.  Feel free to call the clinic you have any questions or concerns. The clinic phone number is [phone].  Please show the CHEMO ALERT CARD at check-in to the Emergency Department and triage nurse.   

## 2016-05-26 NOTE — Progress Notes (Signed)
Weight and vitals stable. Denies pain. Denies hematuria or dysuria. Reports urinary frequency. Reports occasional urinary leakage. Continues to change incontinence pad twice per 24 hour period. Reports nocturia x 3-4. Denies diarrhea. Reports his energy level is unchanged.  BP (!) 120/59 (BP Location: Right Arm, Patient Position: Sitting, Cuff Size: Normal)   Pulse 75   Resp 16   Wt 173 lb (78.5 kg)   SpO2 100%   BMI 28.79 kg/m  Wt Readings from Last 3 Encounters:  05/26/16 173 lb (78.5 kg)  05/19/16 170 lb 12.8 oz (77.5 kg)  05/12/16 171 lb 6.4 oz (77.7 kg)

## 2016-05-27 ENCOUNTER — Other Ambulatory Visit: Payer: Self-pay | Admitting: Family Medicine

## 2016-05-27 ENCOUNTER — Ambulatory Visit
Admission: RE | Admit: 2016-05-27 | Discharge: 2016-05-27 | Disposition: A | Discharge status: Home / Self Care | Payer: Medicare Other | Source: Ambulatory Visit | Attending: Radiation Oncology | Admitting: Radiation Oncology

## 2016-05-27 DIAGNOSIS — I10 Essential (primary) hypertension: Secondary | ICD-10-CM | POA: Diagnosis not present

## 2016-05-27 DIAGNOSIS — E785 Hyperlipidemia, unspecified: Secondary | ICD-10-CM | POA: Diagnosis not present

## 2016-05-27 DIAGNOSIS — M1990 Unspecified osteoarthritis, unspecified site: Secondary | ICD-10-CM | POA: Diagnosis not present

## 2016-05-27 DIAGNOSIS — N529 Male erectile dysfunction, unspecified: Secondary | ICD-10-CM | POA: Diagnosis not present

## 2016-05-27 DIAGNOSIS — C672 Malignant neoplasm of lateral wall of bladder: Secondary | ICD-10-CM | POA: Diagnosis not present

## 2016-05-27 DIAGNOSIS — Z510 Encounter for antineoplastic radiation therapy: Secondary | ICD-10-CM | POA: Diagnosis not present

## 2016-05-29 ENCOUNTER — Ambulatory Visit
Admission: RE | Admit: 2016-05-29 | Discharge: 2016-05-29 | Disposition: A | Discharge status: Home / Self Care | Payer: Medicare Other | Source: Ambulatory Visit | Attending: Radiation Oncology | Admitting: Radiation Oncology

## 2016-05-29 DIAGNOSIS — N529 Male erectile dysfunction, unspecified: Secondary | ICD-10-CM | POA: Diagnosis not present

## 2016-05-29 DIAGNOSIS — C672 Malignant neoplasm of lateral wall of bladder: Secondary | ICD-10-CM | POA: Diagnosis not present

## 2016-05-29 DIAGNOSIS — M1990 Unspecified osteoarthritis, unspecified site: Secondary | ICD-10-CM | POA: Diagnosis not present

## 2016-05-29 DIAGNOSIS — Z510 Encounter for antineoplastic radiation therapy: Secondary | ICD-10-CM | POA: Diagnosis not present

## 2016-05-29 DIAGNOSIS — E785 Hyperlipidemia, unspecified: Secondary | ICD-10-CM | POA: Diagnosis not present

## 2016-05-29 DIAGNOSIS — I10 Essential (primary) hypertension: Secondary | ICD-10-CM | POA: Diagnosis not present

## 2016-05-30 ENCOUNTER — Ambulatory Visit
Admission: RE | Admit: 2016-05-30 | Discharge: 2016-05-30 | Disposition: A | Discharge status: Home / Self Care | Payer: Medicare Other | Source: Ambulatory Visit | Attending: Radiation Oncology | Admitting: Radiation Oncology

## 2016-05-30 DIAGNOSIS — C672 Malignant neoplasm of lateral wall of bladder: Secondary | ICD-10-CM | POA: Diagnosis not present

## 2016-05-30 DIAGNOSIS — E785 Hyperlipidemia, unspecified: Secondary | ICD-10-CM | POA: Diagnosis not present

## 2016-05-30 DIAGNOSIS — I10 Essential (primary) hypertension: Secondary | ICD-10-CM | POA: Diagnosis not present

## 2016-05-30 DIAGNOSIS — M1990 Unspecified osteoarthritis, unspecified site: Secondary | ICD-10-CM | POA: Diagnosis not present

## 2016-05-30 DIAGNOSIS — N529 Male erectile dysfunction, unspecified: Secondary | ICD-10-CM | POA: Diagnosis not present

## 2016-05-30 DIAGNOSIS — Z510 Encounter for antineoplastic radiation therapy: Secondary | ICD-10-CM | POA: Diagnosis not present

## 2016-05-31 ENCOUNTER — Ambulatory Visit: Payer: Medicare Other

## 2016-05-31 DIAGNOSIS — C672 Malignant neoplasm of lateral wall of bladder: Secondary | ICD-10-CM | POA: Diagnosis not present

## 2016-05-31 DIAGNOSIS — E785 Hyperlipidemia, unspecified: Secondary | ICD-10-CM | POA: Diagnosis not present

## 2016-05-31 DIAGNOSIS — M1990 Unspecified osteoarthritis, unspecified site: Secondary | ICD-10-CM | POA: Diagnosis not present

## 2016-05-31 DIAGNOSIS — Z510 Encounter for antineoplastic radiation therapy: Secondary | ICD-10-CM | POA: Diagnosis not present

## 2016-05-31 DIAGNOSIS — N529 Male erectile dysfunction, unspecified: Secondary | ICD-10-CM | POA: Diagnosis not present

## 2016-05-31 DIAGNOSIS — I10 Essential (primary) hypertension: Secondary | ICD-10-CM | POA: Diagnosis not present

## 2016-06-01 ENCOUNTER — Ambulatory Visit
Admission: RE | Admit: 2016-06-01 | Discharge: 2016-06-01 | Disposition: A | Discharge status: Home / Self Care | Payer: Medicare Other | Source: Ambulatory Visit | Attending: Radiation Oncology | Admitting: Radiation Oncology

## 2016-06-01 ENCOUNTER — Encounter: Payer: Self-pay | Admitting: Medical Oncology

## 2016-06-01 ENCOUNTER — Encounter: Payer: Self-pay | Admitting: Radiation Oncology

## 2016-06-01 VITALS — BP 103/59 | HR 72 | Temp 97.8°F | Resp 18 | Ht 65.0 in | Wt 171.2 lb

## 2016-06-01 DIAGNOSIS — Z510 Encounter for antineoplastic radiation therapy: Secondary | ICD-10-CM | POA: Diagnosis not present

## 2016-06-01 DIAGNOSIS — N529 Male erectile dysfunction, unspecified: Secondary | ICD-10-CM | POA: Diagnosis not present

## 2016-06-01 DIAGNOSIS — I10 Essential (primary) hypertension: Secondary | ICD-10-CM | POA: Diagnosis not present

## 2016-06-01 DIAGNOSIS — C672 Malignant neoplasm of lateral wall of bladder: Secondary | ICD-10-CM | POA: Diagnosis not present

## 2016-06-01 DIAGNOSIS — E785 Hyperlipidemia, unspecified: Secondary | ICD-10-CM | POA: Diagnosis not present

## 2016-06-01 DIAGNOSIS — C61 Malignant neoplasm of prostate: Secondary | ICD-10-CM

## 2016-06-01 DIAGNOSIS — M1990 Unspecified osteoarthritis, unspecified site: Secondary | ICD-10-CM | POA: Diagnosis not present

## 2016-06-01 NOTE — Progress Notes (Signed)
Nav 

## 2016-06-01 NOTE — Progress Notes (Signed)
  Radiation Oncology         [phone]   Name: Brian Montgomery MRN: [id number]   Date: 06/01/2016  DOB: [birth year]     Weekly Radiation Therapy Management    ICD-9-CM ICD-10-CM   1. Malignant neoplasm of prostate (HCC) 185 C61     Current Dose: 51 Gy  Planned Dose:  75 Gy  Narrative The patient presents for routine under treatment assessment.  Weight and vitals stable. Denies pain. Denies hematuria or dysuria. Reports urinary frequency. Reports occasional urinary leakage. Continues to change incontinence pad once to twice per 24 hour period. Reports nocturia x 3-4. Denies diarrhea. Reports his energy level is unchanged taking naps during the day.  Set-up films were reviewed. The chart was checked.  Physical Findings  height is 5\' 5"  (1.651 m) and weight is 171 lb 3.2 oz (77.7 kg). His oral temperature is 97.8 F (36.6 C). His blood pressure is 103/59 (abnormal) and his pulse is 72. His respiration is 18 and oxygen saturation is 100%. . Weight essentially stable.  No significant changes. Presents in wheelchair.  Impression The patient is tolerating radiation.   Plan Continue treatment as planned.       Sheral Apley Tammi Klippel, M.D.  This document serves as a record of services personally performed by Tyler Pita, MD. It was created on his behalf by Arlyce Harman, a trained medical scribe. The creation of this record is based on the scribe's personal observations and the provider's statements to them. This document has been checked and approved by the attending provider.

## 2016-06-01 NOTE — Progress Notes (Signed)
Weight and vitals stable. Denies pain. Denies hematuria or dysuria. Reports urinary frequency. Reports occasional urinary leakage. Continues to change incontinence pad twice per 24 hour period. Reports nocturia x 3-4. Denies diarrhea. Reports his energy level is unchanged taking naps during the day. Wt Readings from Last 3 Encounters:  06/01/16 171 lb 3.2 oz (77.7 kg)  05/26/16 173 lb (78.5 kg)  05/19/16 170 lb 12.8 oz (77.5 kg)  Weight loss 3 lbs. 3.2 oz. BP (!) 103/59   Pulse 72   Temp 97.8 F (36.6 C) (Oral)   Resp 18   Ht 5\' 5"  (1.651 m)   Wt 171 lb 3.2 oz (77.7 kg)   SpO2 100%   BMI 28.49 kg/m

## 2016-06-03 ENCOUNTER — Ambulatory Visit: Payer: Medicare Other

## 2016-06-03 ENCOUNTER — Other Ambulatory Visit (HOSPITAL_BASED_OUTPATIENT_CLINIC_OR_DEPARTMENT_OTHER): Payer: Medicare Other

## 2016-06-03 ENCOUNTER — Ambulatory Visit (HOSPITAL_BASED_OUTPATIENT_CLINIC_OR_DEPARTMENT_OTHER): Payer: Medicare Other

## 2016-06-03 VITALS — BP 116/71 | HR 83 | Temp 98.8°F | Resp 18

## 2016-06-03 DIAGNOSIS — Z5111 Encounter for antineoplastic chemotherapy: Secondary | ICD-10-CM | POA: Diagnosis present

## 2016-06-03 DIAGNOSIS — C679 Malignant neoplasm of bladder, unspecified: Secondary | ICD-10-CM

## 2016-06-03 LAB — COMPREHENSIVE METABOLIC PANEL
ALBUMIN: 3.3 g/dL — AB (ref 3.5–5.0)
ALK PHOS: 68 U/L (ref 40–150)
ALT: <6 U/L (ref 0–55)
AST: 18 U/L (ref 5–34)
Anion Gap: 10 mEq/L (ref 3–11)
BUN: 27.1 mg/dL — AB (ref 7.0–26.0)
CALCIUM: 9.6 mg/dL (ref 8.4–10.4)
CHLORIDE: 108 meq/L (ref 98–109)
CO2: 21 mEq/L — ABNORMAL LOW (ref 22–29)
Creatinine: 1.4 mg/dL — ABNORMAL HIGH (ref 0.7–1.3)
EGFR: 54 mL/min/{1.73_m2} — AB (ref >90)
Glucose: 109 mg/dl (ref 70–140)
POTASSIUM: 3.7 meq/L (ref 3.5–5.1)
Sodium: 139 mEq/L (ref 136–145)
Total Bilirubin: 0.97 mg/dL (ref 0.20–1.20)
Total Protein: 7.4 g/dL (ref 6.4–8.3)

## 2016-06-03 LAB — CBC WITH DIFFERENTIAL/PLATELET
BASO%: 0 % (ref 0.0–2.0)
BASOS ABS: 0 10*3/uL (ref 0.0–0.1)
EOS ABS: 0.1 10*3/uL (ref 0.0–0.5)
EOS%: 2.1 % (ref 0.0–7.0)
HEMATOCRIT: 33.9 % — AB (ref 38.4–49.9)
HGB: 11.7 g/dL — ABNORMAL LOW (ref 13.0–17.1)
LYMPH#: 0.3 10*3/uL — AB (ref 0.9–3.3)
LYMPH%: 6.5 % — ABNORMAL LOW (ref 14.0–49.0)
MCH: 29.8 pg (ref 27.2–33.4)
MCHC: 34.5 g/dL (ref 32.0–36.0)
MCV: 86.5 fL (ref 79.3–98.0)
MONO#: 0.2 10*3/uL (ref 0.1–0.9)
MONO%: 3.5 % (ref 0.0–14.0)
NEUT#: 3.8 10*3/uL (ref 1.5–6.5)
NEUT%: 87.9 % — AB (ref 39.0–75.0)
Platelets: 106 10*3/uL — ABNORMAL LOW (ref 140–400)
RBC: 3.92 10*6/uL — ABNORMAL LOW (ref 4.20–5.82)
RDW: 15.3 % — ABNORMAL HIGH (ref 11.0–14.6)
WBC: 4.3 10*3/uL (ref 4.0–10.3)

## 2016-06-03 MED ORDER — PALONOSETRON HCL INJECTION 0.25 MG/5ML
0.2500 mg | Freq: Once | INTRAVENOUS | Status: AC
  Administered 2016-06-03: 0.25 mg via INTRAVENOUS

## 2016-06-03 MED ORDER — DEXAMETHASONE SODIUM PHOSPHATE 10 MG/ML IJ SOLN
INTRAMUSCULAR | Status: AC
  Filled 2016-06-03: qty 1

## 2016-06-03 MED ORDER — PALONOSETRON HCL INJECTION 0.25 MG/5ML
INTRAVENOUS | Status: AC
  Filled 2016-06-03: qty 5

## 2016-06-03 MED ORDER — CARBOPLATIN CHEMO INJECTION 450 MG/45ML
147.6000 mg | Freq: Once | INTRAVENOUS | Status: AC
  Administered 2016-06-03: 150 mg via INTRAVENOUS
  Filled 2016-06-03: qty 15

## 2016-06-03 MED ORDER — SODIUM CHLORIDE 0.9 % IV SOLN
Freq: Once | INTRAVENOUS | Status: AC
  Administered 2016-06-03: 10:00:00 via INTRAVENOUS

## 2016-06-03 MED ORDER — DEXAMETHASONE SODIUM PHOSPHATE 10 MG/ML IJ SOLN
10.0000 mg | Freq: Once | INTRAMUSCULAR | Status: AC
  Administered 2016-06-03: 10 mg via INTRAVENOUS

## 2016-06-03 NOTE — Patient Instructions (Signed)
[facility] Discharge Instructions for Patients Receiving Chemotherapy  Today you received the following chemotherapy agents :  Carboplatin.  To help prevent nausea and vomiting after your treatment, we encourage you to take your nausea medication as prescribed.   If you develop nausea and vomiting that is not controlled by your nausea medication, call the clinic.   BELOW ARE SYMPTOMS THAT SHOULD BE REPORTED IMMEDIATELY:  *FEVER GREATER THAN 100.5 F  *CHILLS WITH OR WITHOUT FEVER  NAUSEA AND VOMITING THAT IS NOT CONTROLLED WITH YOUR NAUSEA MEDICATION  *UNUSUAL SHORTNESS OF BREATH  *UNUSUAL BRUISING OR BLEEDING  TENDERNESS IN MOUTH AND THROAT WITH OR WITHOUT PRESENCE OF ULCERS  *URINARY PROBLEMS  *BOWEL PROBLEMS  UNUSUAL RASH Items with * indicate a potential emergency and should be followed up as soon as possible.  Feel free to call the clinic you have any questions or concerns. The clinic phone number is [phone].  Please show the CHEMO ALERT CARD at check-in to the Emergency Department and triage nurse.   

## 2016-06-05 ENCOUNTER — Ambulatory Visit: Payer: Medicare Other

## 2016-06-06 ENCOUNTER — Ambulatory Visit
Admission: RE | Admit: 2016-06-06 | Discharge: 2016-06-06 | Disposition: A | Discharge status: Home / Self Care | Payer: Medicare Other | Source: Ambulatory Visit | Attending: Radiation Oncology | Admitting: Radiation Oncology

## 2016-06-06 DIAGNOSIS — Z510 Encounter for antineoplastic radiation therapy: Secondary | ICD-10-CM | POA: Diagnosis not present

## 2016-06-06 DIAGNOSIS — E785 Hyperlipidemia, unspecified: Secondary | ICD-10-CM | POA: Diagnosis not present

## 2016-06-06 DIAGNOSIS — N529 Male erectile dysfunction, unspecified: Secondary | ICD-10-CM | POA: Diagnosis not present

## 2016-06-06 DIAGNOSIS — C672 Malignant neoplasm of lateral wall of bladder: Secondary | ICD-10-CM | POA: Diagnosis not present

## 2016-06-06 DIAGNOSIS — I10 Essential (primary) hypertension: Secondary | ICD-10-CM | POA: Diagnosis not present

## 2016-06-06 DIAGNOSIS — M1990 Unspecified osteoarthritis, unspecified site: Secondary | ICD-10-CM | POA: Diagnosis not present

## 2016-06-07 ENCOUNTER — Ambulatory Visit
Admission: RE | Admit: 2016-06-07 | Discharge: 2016-06-07 | Disposition: A | Discharge status: Home / Self Care | Payer: Medicare Other | Source: Ambulatory Visit | Attending: Radiation Oncology | Admitting: Radiation Oncology

## 2016-06-07 DIAGNOSIS — E785 Hyperlipidemia, unspecified: Secondary | ICD-10-CM | POA: Diagnosis not present

## 2016-06-07 DIAGNOSIS — I10 Essential (primary) hypertension: Secondary | ICD-10-CM | POA: Diagnosis not present

## 2016-06-07 DIAGNOSIS — Z510 Encounter for antineoplastic radiation therapy: Secondary | ICD-10-CM | POA: Diagnosis not present

## 2016-06-07 DIAGNOSIS — N529 Male erectile dysfunction, unspecified: Secondary | ICD-10-CM | POA: Diagnosis not present

## 2016-06-07 DIAGNOSIS — C672 Malignant neoplasm of lateral wall of bladder: Secondary | ICD-10-CM | POA: Diagnosis not present

## 2016-06-07 DIAGNOSIS — M1990 Unspecified osteoarthritis, unspecified site: Secondary | ICD-10-CM | POA: Diagnosis not present

## 2016-06-08 ENCOUNTER — Ambulatory Visit
Admission: RE | Admit: 2016-06-08 | Discharge: 2016-06-08 | Disposition: A | Discharge status: Home / Self Care | Payer: Medicare Other | Source: Ambulatory Visit | Attending: Radiation Oncology | Admitting: Radiation Oncology

## 2016-06-08 DIAGNOSIS — I10 Essential (primary) hypertension: Secondary | ICD-10-CM | POA: Diagnosis not present

## 2016-06-08 DIAGNOSIS — M1990 Unspecified osteoarthritis, unspecified site: Secondary | ICD-10-CM | POA: Diagnosis not present

## 2016-06-08 DIAGNOSIS — N529 Male erectile dysfunction, unspecified: Secondary | ICD-10-CM | POA: Diagnosis not present

## 2016-06-08 DIAGNOSIS — C672 Malignant neoplasm of lateral wall of bladder: Secondary | ICD-10-CM | POA: Diagnosis not present

## 2016-06-08 DIAGNOSIS — Z510 Encounter for antineoplastic radiation therapy: Secondary | ICD-10-CM | POA: Diagnosis not present

## 2016-06-08 DIAGNOSIS — E785 Hyperlipidemia, unspecified: Secondary | ICD-10-CM | POA: Diagnosis not present

## 2016-06-09 ENCOUNTER — Other Ambulatory Visit (HOSPITAL_BASED_OUTPATIENT_CLINIC_OR_DEPARTMENT_OTHER): Payer: Medicare Other

## 2016-06-09 ENCOUNTER — Ambulatory Visit
Admission: RE | Admit: 2016-06-09 | Discharge: 2016-06-09 | Disposition: A | Discharge status: Home / Self Care | Payer: Medicare Other | Source: Ambulatory Visit | Attending: Radiation Oncology | Admitting: Radiation Oncology

## 2016-06-09 ENCOUNTER — Ambulatory Visit (HOSPITAL_BASED_OUTPATIENT_CLINIC_OR_DEPARTMENT_OTHER): Payer: Medicare Other

## 2016-06-09 VITALS — BP 113/58 | HR 92 | Temp 98.2°F | Resp 18

## 2016-06-09 DIAGNOSIS — E785 Hyperlipidemia, unspecified: Secondary | ICD-10-CM | POA: Diagnosis not present

## 2016-06-09 DIAGNOSIS — C672 Malignant neoplasm of lateral wall of bladder: Secondary | ICD-10-CM | POA: Diagnosis not present

## 2016-06-09 DIAGNOSIS — N529 Male erectile dysfunction, unspecified: Secondary | ICD-10-CM | POA: Diagnosis not present

## 2016-06-09 DIAGNOSIS — C679 Malignant neoplasm of bladder, unspecified: Secondary | ICD-10-CM

## 2016-06-09 DIAGNOSIS — Z5111 Encounter for antineoplastic chemotherapy: Secondary | ICD-10-CM

## 2016-06-09 DIAGNOSIS — Z510 Encounter for antineoplastic radiation therapy: Secondary | ICD-10-CM | POA: Diagnosis not present

## 2016-06-09 DIAGNOSIS — M1990 Unspecified osteoarthritis, unspecified site: Secondary | ICD-10-CM | POA: Diagnosis not present

## 2016-06-09 DIAGNOSIS — I10 Essential (primary) hypertension: Secondary | ICD-10-CM | POA: Diagnosis not present

## 2016-06-09 LAB — CBC WITH DIFFERENTIAL/PLATELET
BASO%: 0.3 % (ref 0.0–2.0)
Basophils Absolute: 0 10*3/uL (ref 0.0–0.1)
EOS%: 1 % (ref 0.0–7.0)
Eosinophils Absolute: 0 10*3/uL (ref 0.0–0.5)
HEMATOCRIT: 34 % — AB (ref 38.4–49.9)
HGB: 11.7 g/dL — ABNORMAL LOW (ref 13.0–17.1)
LYMPH%: 6.1 % — ABNORMAL LOW (ref 14.0–49.0)
MCH: 30.1 pg (ref 27.2–33.4)
MCHC: 34.4 g/dL (ref 32.0–36.0)
MCV: 87.4 fL (ref 79.3–98.0)
MONO#: 0.4 10*3/uL (ref 0.1–0.9)
MONO%: 13.3 % (ref 0.0–14.0)
NEUT#: 2.3 10*3/uL (ref 1.5–6.5)
NEUT%: 79.3 % — AB (ref 39.0–75.0)
Platelets: 130 10*3/uL — ABNORMAL LOW (ref 140–400)
RBC: 3.89 10*6/uL — ABNORMAL LOW (ref 4.20–5.82)
RDW: 15.6 % — AB (ref 11.0–14.6)
WBC: 2.9 10*3/uL — ABNORMAL LOW (ref 4.0–10.3)
lymph#: 0.2 10*3/uL — ABNORMAL LOW (ref 0.9–3.3)

## 2016-06-09 LAB — COMPREHENSIVE METABOLIC PANEL
ALT: 6 U/L (ref 0–55)
AST: 13 U/L (ref 5–34)
Albumin: 3.2 g/dL — ABNORMAL LOW (ref 3.5–5.0)
Alkaline Phosphatase: 76 U/L (ref 40–150)
Anion Gap: 10 mEq/L (ref 3–11)
BUN: 30.8 mg/dL — AB (ref 7.0–26.0)
CALCIUM: 9.7 mg/dL (ref 8.4–10.4)
CHLORIDE: 107 meq/L (ref 98–109)
CO2: 23 meq/L (ref 22–29)
CREATININE: 1.4 mg/dL — AB (ref 0.7–1.3)
EGFR: 57 mL/min/{1.73_m2} — ABNORMAL LOW (ref >90)
GLUCOSE: 93 mg/dL (ref 70–140)
Potassium: 3.7 mEq/L (ref 3.5–5.1)
SODIUM: 140 meq/L (ref 136–145)
Total Bilirubin: 0.71 mg/dL (ref 0.20–1.20)
Total Protein: 7.7 g/dL (ref 6.4–8.3)

## 2016-06-09 MED ORDER — SODIUM CHLORIDE 0.9 % IV SOLN: 147.6000 mg | Freq: Once | INTRAVENOUS | Status: DC

## 2016-06-09 MED ORDER — PALONOSETRON HCL INJECTION 0.25 MG/5ML
INTRAVENOUS | Status: AC
  Filled 2016-06-09: qty 5

## 2016-06-09 MED ORDER — DEXAMETHASONE SODIUM PHOSPHATE 10 MG/ML IJ SOLN
INTRAMUSCULAR | Status: AC
  Filled 2016-06-09: qty 1

## 2016-06-09 MED ORDER — DEXAMETHASONE SODIUM PHOSPHATE 10 MG/ML IJ SOLN
10.0000 mg | Freq: Once | INTRAMUSCULAR | Status: AC
  Administered 2016-06-09: 10 mg via INTRAVENOUS

## 2016-06-09 MED ORDER — PALONOSETRON HCL INJECTION 0.25 MG/5ML
0.2500 mg | Freq: Once | INTRAVENOUS | Status: AC
  Administered 2016-06-09: 0.25 mg via INTRAVENOUS

## 2016-06-09 MED ORDER — SODIUM CHLORIDE 0.9 % IV SOLN
150.0000 mg | Freq: Once | INTRAVENOUS | Status: AC
  Administered 2016-06-09: 150 mg via INTRAVENOUS
  Filled 2016-06-09: qty 15

## 2016-06-09 MED ORDER — SODIUM CHLORIDE 0.9 % IV SOLN
Freq: Once | INTRAVENOUS | Status: AC
  Administered 2016-06-09: 12:00:00 via INTRAVENOUS

## 2016-06-09 NOTE — Progress Notes (Signed)
Per dr Alen Blew okay to treat despite counts 06/09/16

## 2016-06-09 NOTE — Patient Instructions (Signed)
[facility] Discharge Instructions for Patients Receiving Chemotherapy  Today you received the following chemotherapy agents :  Carboplatin.  To help prevent nausea and vomiting after your treatment, we encourage you to take your nausea medication as prescribed.   If you develop nausea and vomiting that is not controlled by your nausea medication, call the clinic.   BELOW ARE SYMPTOMS THAT SHOULD BE REPORTED IMMEDIATELY:  *FEVER GREATER THAN 100.5 F  *CHILLS WITH OR WITHOUT FEVER  NAUSEA AND VOMITING THAT IS NOT CONTROLLED WITH YOUR NAUSEA MEDICATION  *UNUSUAL SHORTNESS OF BREATH  *UNUSUAL BRUISING OR BLEEDING  TENDERNESS IN MOUTH AND THROAT WITH OR WITHOUT PRESENCE OF ULCERS  *URINARY PROBLEMS  *BOWEL PROBLEMS  UNUSUAL RASH Items with * indicate a potential emergency and should be followed up as soon as possible.  Feel free to call the clinic you have any questions or concerns. The clinic phone number is [phone].  Please show the CHEMO ALERT CARD at check-in to the Emergency Department and triage nurse.   

## 2016-06-10 ENCOUNTER — Encounter: Payer: Self-pay | Admitting: Radiation Oncology

## 2016-06-10 ENCOUNTER — Ambulatory Visit
Admission: RE | Admit: 2016-06-10 | Discharge: 2016-06-10 | Disposition: A | Discharge status: Home / Self Care | Payer: Medicare Other | Source: Ambulatory Visit | Attending: Radiation Oncology | Admitting: Radiation Oncology

## 2016-06-10 VITALS — BP 114/66 | HR 67 | Temp 97.8°F | Ht 65.0 in | Wt 169.6 lb

## 2016-06-10 DIAGNOSIS — C672 Malignant neoplasm of lateral wall of bladder: Secondary | ICD-10-CM | POA: Diagnosis not present

## 2016-06-10 DIAGNOSIS — C61 Malignant neoplasm of prostate: Secondary | ICD-10-CM

## 2016-06-10 DIAGNOSIS — Z510 Encounter for antineoplastic radiation therapy: Secondary | ICD-10-CM | POA: Diagnosis not present

## 2016-06-10 DIAGNOSIS — I10 Essential (primary) hypertension: Secondary | ICD-10-CM | POA: Diagnosis not present

## 2016-06-10 DIAGNOSIS — E785 Hyperlipidemia, unspecified: Secondary | ICD-10-CM | POA: Diagnosis not present

## 2016-06-10 DIAGNOSIS — M1990 Unspecified osteoarthritis, unspecified site: Secondary | ICD-10-CM | POA: Diagnosis not present

## 2016-06-10 DIAGNOSIS — N529 Male erectile dysfunction, unspecified: Secondary | ICD-10-CM | POA: Diagnosis not present

## 2016-06-10 MED ORDER — HYDROCODONE-ACETAMINOPHEN 5-325 MG PO TABS: 1.0000 | ORAL_TABLET | Freq: Four times a day (QID) | ORAL | 0 refills | Status: DC | PRN

## 2016-06-10 NOTE — Progress Notes (Signed)
  Radiation Oncology         [phone]   Name: Brian Montgomery MRN: [id number]   Date: 06/10/2016  DOB: 1936/06/18     Weekly Radiation Therapy Management    ICD-9-CM ICD-10-CM   1. Malignant neoplasm of prostate (HCC) 185 C61     Current Dose: 61 Gy  Planned Dose:  75 Gy  Narrative The patient presents for routine under treatment assessment.  Brian Montgomery has completed 33 fractions to his prostate.  He continues to report having bilateral shoulder pain from transferring from his wheelchair. He is taking 1-2 tablets of hydrocodone/acetaminophen per day and needs a refill.  His family also mentioned he has fallen twice this week.  He reports having nocturia 2-3 times per night.  He denies hematuria, dysuria, diarrhea, or fatigue.  He said his urinary stream is about the same.  Set-up films were reviewed. The chart was checked.  Physical Findings  height is 5\' 5"  (1.651 m) and weight is 169 lb 9.6 oz (76.9 kg). His oral temperature is 97.8 F (36.6 C). His blood pressure is 114/66 and his pulse is 67. His oxygen saturation is 100%. . Weight essentially stable.  No significant changes. Presents in wheelchair.  Impression The patient is tolerating radiation.   Plan Continue treatment as planned. I refilled the patient's Norco       Crescencio Jozwiak A. Tammi Klippel, M.D.  This document serves as a record of services personally performed by Tyler Pita, MD. It was created on his behalf by Darcus Austin, a trained medical scribe. The creation of this record is based on the scribe's personal observations and the provider's statements to them. This document has been checked and approved by the attending provider.

## 2016-06-10 NOTE — Progress Notes (Signed)
Brian Montgomery has completed 33 fractions to his prostate.  He continues to report having bilateral shoulder pain from transferring from his wheelchair.  He is taking 1-2 tablets of hydrocodone/acetaminophen per day and needs a refill.  His family also mentioned he has fallen twice this week.  He reports having nocturia 2-3 times per night.  He denies having hematuria and dysuria.  He said his urinary stream is about the same.  He denies having any diarrhea.  He denies having fatigue.  BP 114/66 (BP Location: Right Arm, Patient Position: Sitting)   Pulse 67   Temp 97.8 F (36.6 C) (Oral)   Ht 5\' 5"  (1.651 m)   Wt 169 lb 9.6 oz (76.9 kg)   SpO2 100%   BMI 28.22 kg/m    Wt Readings from Last 3 Encounters:  06/10/16 169 lb 9.6 oz (76.9 kg)  06/01/16 171 lb 3.2 oz (77.7 kg)  05/26/16 173 lb (78.5 kg)

## 2016-06-13 ENCOUNTER — Inpatient Hospital Stay (HOSPITAL_COMMUNITY)
Admission: EM | Admit: 2016-06-13 | Discharge: 2016-06-17 | DRG: 871 | Disposition: A | Discharge status: Skilled Nursing Facility | Payer: Medicare Other | Attending: Internal Medicine | Admitting: Internal Medicine

## 2016-06-13 ENCOUNTER — Emergency Department (HOSPITAL_COMMUNITY): Payer: Medicare Other

## 2016-06-13 ENCOUNTER — Ambulatory Visit
Admission: RE | Admit: 2016-06-13 | Discharge: 2016-06-13 | Disposition: A | Discharge status: Home / Self Care | Payer: Medicare Other | Source: Ambulatory Visit | Attending: Radiation Oncology | Admitting: Radiation Oncology

## 2016-06-13 ENCOUNTER — Encounter (HOSPITAL_COMMUNITY): Payer: Self-pay | Admitting: Emergency Medicine

## 2016-06-13 ENCOUNTER — Other Ambulatory Visit: Payer: Self-pay

## 2016-06-13 ENCOUNTER — Encounter: Payer: Self-pay | Admitting: Radiation Oncology

## 2016-06-13 VITALS — BP 121/66 | HR 116 | Temp 98.6°F | Resp 18

## 2016-06-13 DIAGNOSIS — C61 Malignant neoplasm of prostate: Secondary | ICD-10-CM

## 2016-06-13 DIAGNOSIS — Z87891 Personal history of nicotine dependence: Secondary | ICD-10-CM

## 2016-06-13 DIAGNOSIS — M545 Low back pain: Secondary | ICD-10-CM | POA: Diagnosis not present

## 2016-06-13 DIAGNOSIS — Z79899 Other long term (current) drug therapy: Secondary | ICD-10-CM

## 2016-06-13 DIAGNOSIS — R05 Cough: Secondary | ICD-10-CM | POA: Diagnosis not present

## 2016-06-13 DIAGNOSIS — R319 Hematuria, unspecified: Secondary | ICD-10-CM | POA: Diagnosis not present

## 2016-06-13 DIAGNOSIS — R000 Tachycardia, unspecified: Secondary | ICD-10-CM | POA: Diagnosis not present

## 2016-06-13 DIAGNOSIS — R262 Difficulty in walking, not elsewhere classified: Secondary | ICD-10-CM | POA: Diagnosis present

## 2016-06-13 DIAGNOSIS — R748 Abnormal levels of other serum enzymes: Secondary | ICD-10-CM | POA: Diagnosis not present

## 2016-06-13 DIAGNOSIS — E860 Dehydration: Secondary | ICD-10-CM | POA: Diagnosis not present

## 2016-06-13 DIAGNOSIS — R531 Weakness: Secondary | ICD-10-CM | POA: Diagnosis not present

## 2016-06-13 DIAGNOSIS — C679 Malignant neoplasm of bladder, unspecified: Secondary | ICD-10-CM | POA: Diagnosis present

## 2016-06-13 DIAGNOSIS — S199XXA Unspecified injury of neck, initial encounter: Secondary | ICD-10-CM | POA: Diagnosis not present

## 2016-06-13 DIAGNOSIS — N390 Urinary tract infection, site not specified: Secondary | ICD-10-CM | POA: Diagnosis not present

## 2016-06-13 DIAGNOSIS — M6281 Muscle weakness (generalized): Secondary | ICD-10-CM | POA: Diagnosis not present

## 2016-06-13 DIAGNOSIS — Z8551 Personal history of malignant neoplasm of bladder: Secondary | ICD-10-CM | POA: Diagnosis not present

## 2016-06-13 DIAGNOSIS — E785 Hyperlipidemia, unspecified: Secondary | ICD-10-CM | POA: Diagnosis present

## 2016-06-13 DIAGNOSIS — N179 Acute kidney failure, unspecified: Secondary | ICD-10-CM | POA: Diagnosis present

## 2016-06-13 DIAGNOSIS — R6520 Severe sepsis without septic shock: Secondary | ICD-10-CM | POA: Diagnosis not present

## 2016-06-13 DIAGNOSIS — I10 Essential (primary) hypertension: Secondary | ICD-10-CM | POA: Diagnosis not present

## 2016-06-13 DIAGNOSIS — R41841 Cognitive communication deficit: Secondary | ICD-10-CM | POA: Diagnosis not present

## 2016-06-13 DIAGNOSIS — R4182 Altered mental status, unspecified: Secondary | ICD-10-CM | POA: Diagnosis not present

## 2016-06-13 DIAGNOSIS — Z923 Personal history of irradiation: Secondary | ICD-10-CM

## 2016-06-13 DIAGNOSIS — Z8249 Family history of ischemic heart disease and other diseases of the circulatory system: Secondary | ICD-10-CM | POA: Diagnosis not present

## 2016-06-13 DIAGNOSIS — Z510 Encounter for antineoplastic radiation therapy: Secondary | ICD-10-CM | POA: Diagnosis not present

## 2016-06-13 DIAGNOSIS — C672 Malignant neoplasm of lateral wall of bladder: Secondary | ICD-10-CM | POA: Diagnosis not present

## 2016-06-13 DIAGNOSIS — J189 Pneumonia, unspecified organism: Secondary | ICD-10-CM | POA: Diagnosis present

## 2016-06-13 DIAGNOSIS — F0390 Unspecified dementia without behavioral disturbance: Secondary | ICD-10-CM | POA: Diagnosis present

## 2016-06-13 DIAGNOSIS — G8389 Other specified paralytic syndromes: Secondary | ICD-10-CM | POA: Diagnosis present

## 2016-06-13 DIAGNOSIS — G609 Hereditary and idiopathic neuropathy, unspecified: Secondary | ICD-10-CM | POA: Diagnosis present

## 2016-06-13 DIAGNOSIS — R778 Other specified abnormalities of plasma proteins: Secondary | ICD-10-CM

## 2016-06-13 DIAGNOSIS — R2681 Unsteadiness on feet: Secondary | ICD-10-CM | POA: Diagnosis present

## 2016-06-13 DIAGNOSIS — A419 Sepsis, unspecified organism: Secondary | ICD-10-CM | POA: Diagnosis not present

## 2016-06-13 DIAGNOSIS — R260 Ataxic gait: Secondary | ICD-10-CM | POA: Diagnosis not present

## 2016-06-13 DIAGNOSIS — N189 Chronic kidney disease, unspecified: Secondary | ICD-10-CM | POA: Diagnosis not present

## 2016-06-13 DIAGNOSIS — R7989 Other specified abnormal findings of blood chemistry: Secondary | ICD-10-CM | POA: Diagnosis not present

## 2016-06-13 LAB — COMPREHENSIVE METABOLIC PANEL
ALBUMIN: 3.8 g/dL (ref 3.5–5.0)
ALK PHOS: 77 U/L (ref 38–126)
ALT: 24 U/L (ref 17–63)
AST: 57 U/L — AB (ref 15–41)
Anion gap: 12 (ref 5–15)
BUN: 43 mg/dL — AB (ref 6–20)
CALCIUM: 9.3 mg/dL (ref 8.9–10.3)
CO2: 23 mmol/L (ref 22–32)
CREATININE: 2.17 mg/dL — AB (ref 0.61–1.24)
Chloride: 104 mmol/L (ref 101–111)
GFR calc non Af Amer: 27 mL/min — ABNORMAL LOW (ref >60)
GFR, EST AFRICAN AMERICAN: 32 mL/min — AB (ref >60)
GLUCOSE: 129 mg/dL — AB (ref 65–99)
Potassium: 3.7 mmol/L (ref 3.5–5.1)
SODIUM: 139 mmol/L (ref 135–145)
Total Bilirubin: 1.2 mg/dL (ref 0.3–1.2)
Total Protein: 8.6 g/dL — ABNORMAL HIGH (ref 6.5–8.1)

## 2016-06-13 LAB — CBC WITH DIFFERENTIAL/PLATELET
BASOS ABS: 0 10*3/uL (ref 0.0–0.1)
Basophils Relative: 0 %
EOS ABS: 0 10*3/uL (ref 0.0–0.7)
Eosinophils Relative: 0 %
HCT: 36.2 % — ABNORMAL LOW (ref 39.0–52.0)
HEMOGLOBIN: 12.3 g/dL — AB (ref 13.0–17.0)
LYMPHS ABS: 0.5 10*3/uL — AB (ref 0.7–4.0)
Lymphocytes Relative: 7 %
MCH: 30.1 pg (ref 26.0–34.0)
MCHC: 34 g/dL (ref 30.0–36.0)
MCV: 88.7 fL (ref 78.0–100.0)
Monocytes Absolute: 0.6 10*3/uL (ref 0.1–1.0)
Monocytes Relative: 8 %
NEUTROS PCT: 85 %
Neutro Abs: 6.9 10*3/uL (ref 1.7–7.7)
Platelets: 174 10*3/uL (ref 150–400)
RBC: 4.08 MIL/uL — AB (ref 4.22–5.81)
RDW: 16.2 % — ABNORMAL HIGH (ref 11.5–15.5)
WBC: 8.1 10*3/uL (ref 4.0–10.5)

## 2016-06-13 LAB — PROCALCITONIN: Procalcitonin: 3.23 ng/mL

## 2016-06-13 LAB — APTT: aPTT: 42 seconds — ABNORMAL HIGH (ref 24–36)

## 2016-06-13 LAB — POC OCCULT BLOOD, ED: Fecal Occult Bld: NEGATIVE

## 2016-06-13 LAB — I-STAT CG4 LACTIC ACID, ED: Lactic Acid, Venous: 2.82 mmol/L (ref 0.5–1.9)

## 2016-06-13 LAB — URINALYSIS, ROUTINE W REFLEX MICROSCOPIC
GLUCOSE, UA: NEGATIVE mg/dL
Ketones, ur: NEGATIVE mg/dL
Nitrite: NEGATIVE
Protein, ur: 100 mg/dL — AB
SPECIFIC GRAVITY, URINE: 1.02 (ref 1.005–1.030)
pH: 5 (ref 5.0–8.0)

## 2016-06-13 LAB — URINE MICROSCOPIC-ADD ON

## 2016-06-13 LAB — TROPONIN I
Troponin I: 0.03 ng/mL (ref <0.03)
Troponin I: 0.03 ng/mL (ref <0.03)

## 2016-06-13 LAB — AMMONIA: Ammonia: 9 umol/L (ref 9–35)

## 2016-06-13 LAB — CBG MONITORING, ED: GLUCOSE-CAPILLARY: 119 mg/dL — AB (ref 65–99)

## 2016-06-13 LAB — LACTIC ACID, PLASMA
LACTIC ACID, VENOUS: 1.2 mmol/L (ref 0.5–1.9)
Lactic Acid, Venous: 1.3 mmol/L (ref 0.5–1.9)

## 2016-06-13 LAB — CREATININE, URINE, RANDOM: Creatinine, Urine: 101.66 mg/dL

## 2016-06-13 LAB — SODIUM, URINE, RANDOM: SODIUM UR: 95 mmol/L

## 2016-06-13 LAB — TSH: TSH: 0.856 u[IU]/mL (ref 0.350–4.500)

## 2016-06-13 MED ORDER — HYDROCODONE-ACETAMINOPHEN 5-325 MG PO TABS: 1.0000 | ORAL_TABLET | ORAL | Status: DC | PRN

## 2016-06-13 MED ORDER — VANCOMYCIN HCL IN DEXTROSE 1-5 GM/200ML-% IV SOLN
1000.0000 mg | Freq: Once | INTRAVENOUS | Status: AC
  Administered 2016-06-13: 1000 mg via INTRAVENOUS
  Filled 2016-06-13: qty 200

## 2016-06-13 MED ORDER — SODIUM CHLORIDE 0.9 % IV BOLUS (SEPSIS)
500.0000 mL | Freq: Once | INTRAVENOUS | Status: AC
  Administered 2016-06-13: 500 mL via INTRAVENOUS

## 2016-06-13 MED ORDER — ONDANSETRON HCL 4 MG PO TABS: 4.0000 mg | ORAL_TABLET | Freq: Four times a day (QID) | ORAL | Status: DC | PRN

## 2016-06-13 MED ORDER — SODIUM CHLORIDE 0.9 % IV SOLN
INTRAVENOUS | Status: AC
  Administered 2016-06-13 – 2016-06-15 (×4): via INTRAVENOUS
  Administered 2016-06-15: 100 mL/h via INTRAVENOUS
  Administered 2016-06-16 (×2): via INTRAVENOUS

## 2016-06-13 MED ORDER — ASPIRIN 81 MG PO CHEW
324.0000 mg | CHEWABLE_TABLET | Freq: Once | ORAL | Status: AC
Start: 2016-06-13 — End: 2016-06-13
  Administered 2016-06-13: 324 mg via ORAL
  Filled 2016-06-13: qty 4

## 2016-06-13 MED ORDER — ENOXAPARIN SODIUM 30 MG/0.3ML ~~LOC~~ SOLN
30.0000 mg | SUBCUTANEOUS | Status: DC
  Administered 2016-06-13 – 2016-06-16 (×4): 30 mg via SUBCUTANEOUS
  Filled 2016-06-13 (×4): qty 0.3

## 2016-06-13 MED ORDER — PIPERACILLIN-TAZOBACTAM 3.375 G IVPB 30 MIN
3.3750 g | Freq: Once | INTRAVENOUS | Status: AC
  Administered 2016-06-13: 3.375 g via INTRAVENOUS
  Filled 2016-06-13: qty 50

## 2016-06-13 MED ORDER — SENNOSIDES-DOCUSATE SODIUM 8.6-50 MG PO TABS: 1.0000 | ORAL_TABLET | Freq: Every evening | ORAL | Status: DC | PRN

## 2016-06-13 MED ORDER — ONDANSETRON HCL 4 MG/2ML IJ SOLN: 4.0000 mg | Freq: Four times a day (QID) | INTRAMUSCULAR | Status: DC | PRN

## 2016-06-13 MED ORDER — SORBITOL 70 % SOLN: 30.0000 mL | Freq: Every day | Status: DC | PRN

## 2016-06-13 MED ORDER — SODIUM CHLORIDE 0.9 % IV BOLUS (SEPSIS): 250.0000 mL | Freq: Once | INTRAVENOUS | Status: DC

## 2016-06-13 MED ORDER — SODIUM CHLORIDE 0.9 % IV BOLUS (SEPSIS)
1000.0000 mL | Freq: Once | INTRAVENOUS | Status: AC
  Administered 2016-06-13: 1000 mL via INTRAVENOUS

## 2016-06-13 MED ORDER — VANCOMYCIN HCL IN DEXTROSE 750-5 MG/150ML-% IV SOLN: 750.0000 mg | INTRAVENOUS | Status: DC

## 2016-06-13 MED ORDER — LEVALBUTEROL HCL 0.63 MG/3ML IN NEBU
0.6300 mg | INHALATION_SOLUTION | RESPIRATORY_TRACT | Status: DC | PRN
Start: 2016-06-13 — End: 2016-06-17

## 2016-06-13 MED ORDER — SODIUM CHLORIDE 0.9 % IV BOLUS (SEPSIS): 1000.0000 mL | Freq: Once | INTRAVENOUS | Status: DC

## 2016-06-13 MED ORDER — DONEPEZIL HCL 5 MG PO TABS
5.0000 mg | ORAL_TABLET | Freq: Every day | ORAL | Status: DC
Start: 2016-06-13 — End: 2016-06-17
  Administered 2016-06-13 – 2016-06-16 (×4): 5 mg via ORAL
  Filled 2016-06-13 (×4): qty 1

## 2016-06-13 MED ORDER — VANCOMYCIN HCL IN DEXTROSE 750-5 MG/150ML-% IV SOLN
750.0000 mg | INTRAVENOUS | Status: DC
  Administered 2016-06-14 – 2016-06-15 (×2): 750 mg via INTRAVENOUS
  Filled 2016-06-13 (×2): qty 150

## 2016-06-13 MED ORDER — HYDROCODONE-ACETAMINOPHEN 5-325 MG PO TABS: 1.0000 | ORAL_TABLET | Freq: Four times a day (QID) | ORAL | Status: DC | PRN

## 2016-06-13 MED ORDER — ACETAMINOPHEN 650 MG RE SUPP: 650.0000 mg | Freq: Four times a day (QID) | RECTAL | Status: DC | PRN

## 2016-06-13 MED ORDER — PIPERACILLIN-TAZOBACTAM 3.375 G IVPB
3.3750 g | Freq: Three times a day (TID) | INTRAVENOUS | Status: DC
  Administered 2016-06-13 – 2016-06-16 (×8): 3.375 g via INTRAVENOUS
  Filled 2016-06-13 (×9): qty 50

## 2016-06-13 MED ORDER — SODIUM CHLORIDE 0.9 % IV BOLUS (SEPSIS)
1000.0000 mL | Freq: Once | INTRAVENOUS | Status: AC
  Administered 2016-06-13: 19:00:00 via INTRAVENOUS

## 2016-06-13 MED ORDER — CARBIDOPA-LEVODOPA 25-100 MG PO TABS
1.0000 | ORAL_TABLET | Freq: Three times a day (TID) | ORAL | Status: DC
  Administered 2016-06-13 – 2016-06-14 (×2): 1 via ORAL
  Filled 2016-06-13 (×2): qty 1

## 2016-06-13 MED ORDER — ACETAMINOPHEN 325 MG PO TABS: 650.0000 mg | ORAL_TABLET | Freq: Four times a day (QID) | ORAL | Status: DC | PRN

## 2016-06-13 MED ORDER — PHENAZOPYRIDINE HCL 100 MG PO TABS
100.0000 mg | ORAL_TABLET | Freq: Three times a day (TID) | ORAL | Status: DC | PRN
  Filled 2016-06-13: qty 1

## 2016-06-13 MED ORDER — SODIUM CHLORIDE 0.9% FLUSH: 3.0000 mL | Freq: Two times a day (BID) | INTRAVENOUS | Status: DC

## 2016-06-13 NOTE — ED Triage Notes (Addendum)
Per Aldona Bar from [facility] pt just received radiation treatment for prostate cancer and requesting evaluation related to continued tachycardia, weakness, dizziness and unsteady gait since Saturday. Hx of palsy.

## 2016-06-13 NOTE — ED Provider Notes (Signed)
[facility] DEPT Provider Note   CSN: [id number] Arrival date & time: 06/13/16  1212     History   Chief Complaint Chief Complaint  Patient presents with  . Weakness     HPI  Blood pressure 130/86, pulse (!) 129, temperature 98.8 F (37.1 C), resp. rate (!) 30, height 5\' 5"  (1.651 m), weight 76.7 kg, SpO2 99 %.  Brian Montgomery is a 80 y.o. male past medical history significant for prostate and bladder cancer sent from cancer center for new onset inability to walk and somnolence, states he is unable to walk onset 4 days ago and normally he ambulates with a walker he's had 2 falls with no head trauma, loss of consciousness, headache, dysarthria. He reports a generalized weakness in lower extremities and no focal weakness. As per his daughter she also supplies most of the history he's been more somnolent than normal. They deny active fever but states he had a tactile fever at the onset of the weakness 3 days ago. He denies any chest pain, change in his vision, nausea, vomiting, change in bowel or bladder habits. He normally wears depends and this is largely because she is unable to make it to the bathroom in time enough to urinate but he's had multiple episodes of bowel movement into the depends which is atypical for him. He denies any active back pain and states that he does register the sensation that he has to defecate.   Past Medical History:  Diagnosis Date  . Arthritis   . Bladder cancer ([place])    infiltrative high grade papillary urothelial carcinoma   . Dyslipidemia   . ED (erectile dysfunction)   . Hypertension   . Palsy ([place])    super nuclear palsy managed by [facility]    Patient Active Problem List   Diagnosis Date Noted  . Dehydration 06/13/2016  . Malignant neoplasm of prostate ([place]) 03/18/2016  . Bladder cancer ([place]) 01/21/2016  . Appendicular ataxia 09/24/2015  . Pruritus 09/24/2015  . Hereditary and idiopathic peripheral neuropathy 01/14/2015  . Gait instability  10/13/2014  . Hyperlipidemia with target LDL less than 100 08/06/2013  . Essential hypertension, benign 09/17/2012    Past Surgical History:  Procedure Laterality Date  . APPENDECTOMY    . BACK SURGERY    . COLONOSCOPY  2007   Dr.kaplan  . CYSTOSCOPY N/A 03/22/2016   Procedure: CYSTOSCOPY;  Surgeon: Raynelle Bring, MD;  Location: [facility] ORS;  Service: Urology;  Laterality: N/A;  . CYSTOSCOPY W/ RETROGRADES N/A 01/21/2016   Procedure: CYSTOSCOPY WITH TURBT WITH  CLOT EVACUATION;  Surgeon: Raynelle Bring, MD;  Location: [facility];  Service: Urology;  Laterality: N/A;  . left total knee  2011  . PROSTATE BIOPSY N/A 03/22/2016   Procedure: BIOPSY TRANSRECTAL ULTRASONIC PROSTATE (TUBP);  Surgeon: Raynelle Bring, MD;  Location: [facility];  Service: Urology;  Laterality: N/A;  . TRANSURETHRAL RESECTION OF BLADDER TUMOR N/A 03/22/2016   Procedure: TRANSURETHRAL RESECTION OF BLADDER TUMOR (TURBT);  Surgeon: Raynelle Bring, MD;  Location: [facility];  Service: Urology;  Laterality: N/A;       Home Medications    Prior to Admission medications   Medication Sig Start Date End Date Taking? Authorizing Provider  carbidopa-levodopa (SINEMET IR) 25-100 MG tablet TAKE 1/2 TABLET BY MOUTH TWICE DAILY FOR 3 DAYS, 1/2 TABLET THREE TIMES DAILY FOR 3 DAYS, THEN 1 TABLET THREE TIMES DAILY 03/09/16  Yes Pieter Partridge, DO  donepezil (ARICEPT) 5 MG tablet Take 1 tablet (5 mg  total) by mouth at bedtime. 04/15/16  Yes Pieter Partridge, DO  HYDROcodone-acetaminophen (NORCO/VICODIN) 5-325 MG tablet Take 1-2 tablets by mouth every 6 (six) hours as needed. Patient taking differently: Take 1-2 tablets by mouth every 6 (six) hours as needed. One to two, po every 4-6 hours prn pain. Qty 60. No refills. Handwritten script provided to patient by Dr. Tammi Klippel on 06/10/16. 06/10/16  Yes Tyler Pita, MD  lisinopril-hydrochlorothiazide (PRINZIDE,ZESTORETIC) 10-12.5 MG tablet Take 1 tablet by mouth daily. 05/28/15  Yes Denita Lung, MD  simvastatin  (ZOCOR) 20 MG tablet TAKE 1 TABLET BY MOUTH DAILY 05/27/16  Yes Denita Lung, MD  phenazopyridine (PYRIDIUM) 100 MG tablet Take 1 tablet (100 mg total) by mouth 3 (three) times daily as needed for pain (for burning). Patient not taking: Reported on 06/13/2016 03/22/16   Raynelle Bring, MD  prochlorperazine (COMPAZINE) 10 MG tablet Take 1 tablet (10 mg total) by mouth every 6 (six) hours as needed for nausea or vomiting. Patient not taking: Reported on 06/13/2016 03/16/16   Wyatt Portela, MD    Family History Family History  Problem Relation Age of Onset  . Heart failure Mother   . Hypertension Mother   . Heart failure Father   . Hypertension Father   . Renal Disease Sister   . Diabetes Brother     Social History Social History  Substance Use Topics  . Smoking status: Former Smoker    Packs/day: 0.25    Years: 2.00    Types: Cigarettes    Quit date: 07/12/1967  . Smokeless tobacco: Never Used  . Alcohol use 0.0 oz/week     Comment: occasional beer or wine      Allergies   Patient has no known allergies.   Review of Systems Review of Systems  10 systems reviewed and found to be negative, except as noted in the HPI.   Physical Exam Updated Vital Signs BP 107/58   Pulse 90   Temp 98.8 F (37.1 C)   Resp 24   Ht 5\' 5"  (1.651 m)   Wt 76.7 kg   SpO2 97%   BMI 28.12 kg/m   Physical Exam  Constitutional: He is oriented to person, place, and time. He appears well-developed and well-nourished. No distress.  HENT:  Head: Normocephalic and atraumatic.  Mouth/Throat: Oropharynx is clear and moist.  Eyes: Conjunctivae and EOM are normal. Pupils are equal, round, and reactive to light.  Neck: Normal range of motion.  Cardiovascular: Normal rate, regular rhythm and intact distal pulses.   Pulmonary/Chest: Effort normal and breath sounds normal.  Abdominal: Soft. There is no tenderness.  Genitourinary:  Genitourinary Comments: General rectal exam a chaperoned by nurse: No  rashes or lesions, normal rectal tone, normal stool color.  Musculoskeletal: Normal range of motion.  Neurological: He is alert and oriented to person, place, and time.  Left upper arm 3 out of 5 strength, daughters and patient states this is chronic from remote trauma.  Extensor hallux longus is 3/5 bilaterally, patient can lift both legs up off the gurney.  II-Visual fields grossly intact. III/IV/VI-Extraocular movements intact.  Pupils reactive bilaterally. V/VII-Smile symmetric, equal eyebrow raise,  facial sensation intact VIII- Hearing grossly intact IX/X-Normal gag XI-bilateral shoulder shrug XII-midline tongue extension    Skin: He is not diaphoretic.  Psychiatric: He has a normal mood and affect.  Nursing note and vitals reviewed.    ED Treatments / Results  Labs (all labs ordered are listed, but only  abnormal results are displayed) Labs Reviewed  URINALYSIS, ROUTINE W REFLEX MICROSCOPIC (NOT AT [facility]) - Abnormal; Notable for the following:       Result Value   Color, Urine AMBER (*)    APPearance TURBID (*)    Hgb urine dipstick MODERATE (*)    Bilirubin Urine SMALL (*)    Protein, ur 100 (*)    Leukocytes, UA SMALL (*)    All other components within normal limits  CBC WITH DIFFERENTIAL/PLATELET - Abnormal; Notable for the following:    RBC 4.08 (*)    Hemoglobin 12.3 (*)    HCT 36.2 (*)    RDW 16.2 (*)    Lymphs Abs 0.5 (*)    All other components within normal limits  COMPREHENSIVE METABOLIC PANEL - Abnormal; Notable for the following:    Glucose, Bld 129 (*)    BUN 43 (*)    Creatinine, Ser 2.17 (*)    Total Protein 8.6 (*)    AST 57 (*)    GFR calc non Af Amer 27 (*)    GFR calc Af Amer 32 (*)    All other components within normal limits  TROPONIN I - Abnormal; Notable for the following:    Troponin I 0.03 (*)    All other components within normal limits  URINE MICROSCOPIC-ADD ON - Abnormal; Notable for the following:    Squamous Epithelial / LPF  0-5 (*)    Bacteria, UA MANY (*)    All other components within normal limits  CBG MONITORING, ED - Abnormal; Notable for the following:    Glucose-Capillary 119 (*)    All other components within normal limits  I-STAT CG4 LACTIC ACID, ED - Abnormal; Notable for the following:    Lactic Acid, Venous 2.82 (*)    All other components within normal limits  CULTURE, BLOOD (ROUTINE X 2)  CULTURE, BLOOD (ROUTINE X 2)  URINE CULTURE  AMMONIA  POC OCCULT BLOOD, ED  I-STAT CG4 LACTIC ACID, ED    EKG  EKG Interpretation  Date/Time:  Monday June 13 2016 14:15:47 EST Ventricular Rate:  112 PR Interval:    QRS Duration: 88 QT Interval:  314 QTC Calculation: 429 R Axis:   41 Text Interpretation:  Sinus tachycardia Non-specific St-t changes since last tracing, otherwise no significant change since last tracing Confirmed by ISAACS MD, Lysbeth Galas [phone]) on 06/13/2016 3:50:25 PM       Radiology Dg Chest 2 View  Result Date: 06/13/2016 CLINICAL DATA:  Tachycardia, weakness and dizziness. EXAM: CHEST  2 VIEW COMPARISON:  None. FINDINGS: The heart size and mediastinal contours are within normal limits. Both lungs are clear. The visualized skeletal structures are unremarkable. IMPRESSION: No active cardiopulmonary disease. Electronically Signed   By: Fidela Salisbury M.D.   On: 06/13/2016 14:29   Dg Lumbar Spine Complete  Result Date: 06/13/2016 CLINICAL DATA:  Bilateral lower extremity weakness. Unsteady gait. Altered mental status. Recent radiation treatment for prostate cancer. EXAM: LUMBAR SPINE - COMPLETE 4+ VIEW COMPARISON:  CT abdomen and pelvis 01/29/2016 FINDINGS: There 5 non rib-bearing lumbar type vertebral bodies. Vertebral body heights are preserved without evidence of fracture. Moderate disc space narrowing is present at L4-5 and L5-S1, with mild disc space narrowing more proximally in the lumbar spine. Vacuum disc is apparent at L2-3, L3-4, and likely L1-2. Mild-to-moderate  marginal endplate osteophyte formation is present throughout the lumbar spine. No pars defects are identified. There is mild-to-moderate mid to lower lumbar spine facet arthrosis. Fiducials  overlie the prostate. Aortic atherosclerosis is noted. IMPRESSION: 1. No evidence of acute osseous abnormality. 2. Moderate lumbar disc degeneration. 3. Aortic atherosclerosis. Electronically Signed   By: Logan Bores M.D.   On: 06/13/2016 14:31   Ct Head Wo Contrast  Result Date: 06/13/2016 CLINICAL DATA:  Altered mental status.  Bilateral leg weakness EXAM: CT HEAD WITHOUT CONTRAST CT CERVICAL SPINE WITHOUT CONTRAST TECHNIQUE: Multidetector CT imaging of the head and cervical spine was performed following the standard protocol without intravenous contrast. Multiplanar CT image reconstructions of the cervical spine were also generated. COMPARISON:  MRI head 10/27/2014.  MRI cervical spine 09/01/2015 FINDINGS: CT HEAD FINDINGS Brain: Generalized atrophy. Chronic microvascular ischemic changes in the white matter. Negative for acute infarct. Negative for hemorrhage or mass. Vascular: Negative for dense MCA. Skull: Negative for fracture Sinuses/Orbits: With negative Other: None CT CERVICAL SPINE FINDINGS Alignment: 3 mm anterior listhesis at C7-T1. Remaining alignment normal. Straightening of the cervical lordosis Skull base and vertebrae: Negative for fracture. Soft tissues and spinal canal: Atherosclerotic calcification. No soft tissue mass or swelling Disc levels: Disc degeneration and spurring at C3-4, C4-5, C5-6, and C6-7 of a moderate degree. Degenerative anterolisthesis C7-T1 due to advanced facet degeneration. Upper chest: Negative Other: None IMPRESSION: Atrophy and chronic microvascular ischemic change. No acute intracranial abnormality Moderate cervical degenerative change. Negative for cervical spine fracture. Electronically Signed   By: Franchot Gallo M.D.   On: 06/13/2016 14:06   Ct Cervical Spine Wo  Contrast  Result Date: 06/13/2016 CLINICAL DATA:  Altered mental status.  Bilateral leg weakness EXAM: CT HEAD WITHOUT CONTRAST CT CERVICAL SPINE WITHOUT CONTRAST TECHNIQUE: Multidetector CT imaging of the head and cervical spine was performed following the standard protocol without intravenous contrast. Multiplanar CT image reconstructions of the cervical spine were also generated. COMPARISON:  MRI head 10/27/2014.  MRI cervical spine 09/01/2015 FINDINGS: CT HEAD FINDINGS Brain: Generalized atrophy. Chronic microvascular ischemic changes in the white matter. Negative for acute infarct. Negative for hemorrhage or mass. Vascular: Negative for dense MCA. Skull: Negative for fracture Sinuses/Orbits: With negative Other: None CT CERVICAL SPINE FINDINGS Alignment: 3 mm anterior listhesis at C7-T1. Remaining alignment normal. Straightening of the cervical lordosis Skull base and vertebrae: Negative for fracture. Soft tissues and spinal canal: Atherosclerotic calcification. No soft tissue mass or swelling Disc levels: Disc degeneration and spurring at C3-4, C4-5, C5-6, and C6-7 of a moderate degree. Degenerative anterolisthesis C7-T1 due to advanced facet degeneration. Upper chest: Negative Other: None IMPRESSION: Atrophy and chronic microvascular ischemic change. No acute intracranial abnormality Moderate cervical degenerative change. Negative for cervical spine fracture. Electronically Signed   By: Franchot Gallo M.D.   On: 06/13/2016 14:06    Procedures Procedures (including critical care time)  CRITICAL CARE Performed by: Monico Blitz   Total critical care time: 35 minutes  Critical care time was exclusive of separately billable procedures and treating other patients.  Critical care was necessary to treat or prevent imminent or life-threatening deterioration.  Critical care was time spent personally by me on the following activities: development of treatment plan with patient and/or surrogate as  well as nursing, discussions with consultants, evaluation of patient's response to treatment, examination of patient, obtaining history from patient or surrogate, ordering and performing treatments and interventions, ordering and review of laboratory studies, ordering and review of radiographic studies, pulse oximetry and re-evaluation of patient's condition.   Medications Ordered in ED Medications  sodium chloride 0.9 % bolus 1,000 mL (1,000 mLs Intravenous New Bag/Given 06/13/16  1613)    And  sodium chloride 0.9 % bolus 1,000 mL (not administered)    And  sodium chloride 0.9 % bolus 500 mL (not administered)  piperacillin-tazobactam (ZOSYN) IVPB 3.375 g (not administered)  vancomycin (VANCOCIN) IVPB 750 mg/150 ml premix (not administered)  sodium chloride 0.9 % bolus 500 mL (0 mLs Intravenous Stopped 06/13/16 1641)  aspirin chewable tablet 324 mg (324 mg Oral Given 06/13/16 1416)  piperacillin-tazobactam (ZOSYN) IVPB 3.375 g (0 g Intravenous Stopped 06/13/16 1620)  vancomycin (VANCOCIN) IVPB 1000 mg/200 mL premix (1,000 mg Intravenous New Bag/Given 06/13/16 1612)   Initial Impression / Assessment and Plan / ED Course  I have reviewed the triage vital signs and the nursing notes.  Pertinent labs & imaging results that were available during my care of the patient were reviewed by me and considered in my medical decision making (see chart for details).  Clinical Course as of Jun 13 1721  Mon Jun 13, 2016  1454 DG Chest 2 View [NP]  [id number] EKG 12-Lead [CI]  1548 ED EKG [CI]    Clinical Course User Index [CI] Duffy Bruce, MD [NP] Monico Blitz, PA-C    Vitals:   06/13/16 1430 06/13/16 1557 06/13/16 1600 06/13/16 1630  BP: 133/86 108/68 106/67 107/58  Pulse: 114 106 99 90  Resp: (!) 33 (!) 28 24 24   Temp:      SpO2: 95% 96% 97% 97%  Weight:      Height:        Medications  sodium chloride 0.9 % bolus 1,000 mL (1,000 mLs Intravenous New Bag/Given 06/13/16 1613)    And  sodium  chloride 0.9 % bolus 1,000 mL (not administered)    And  sodium chloride 0.9 % bolus 500 mL (not administered)  piperacillin-tazobactam (ZOSYN) IVPB 3.375 g (not administered)  vancomycin (VANCOCIN) IVPB 750 mg/150 ml premix (not administered)  sodium chloride 0.9 % bolus 500 mL (0 mLs Intravenous Stopped 06/13/16 1641)  aspirin chewable tablet 324 mg (324 mg Oral Given 06/13/16 1416)  piperacillin-tazobactam (ZOSYN) IVPB 3.375 g (0 g Intravenous Stopped 06/13/16 1620)  vancomycin (VANCOCIN) IVPB 1000 mg/200 mL premix (1,000 mg Intravenous New Bag/Given 06/13/16 1612)    Brian Montgomery is 80 y.o. male presenting with generalized weakness, somnolence and inability to ambulate onset 2 days ago, neurologic exam is nonfocal, patient is tachycardic, afebrile. Elevated lactic acid 2.82, blood culture pending, UA pending. No leukocytosis. Out of an abundance of caution patient will be started on Vanco and Zosyn and aggressively hydrated. No history of CHF, no prior cardiac history.  Patient with no saddle anesthesia, incontinent at his baseline, denies back pain, normal plain film of lumbar spine. Doubt this is a cauda equina.  I-STAT troponin is above normal at 0.03. Full dose aspirin given, no cardiac history. Likely demand ischemia.  No neutropenia.  Visit no infiltrate, CT head negative. This is likely failure to thrive with dehydration. Patient will be admitted to [facility] hospitalist  Discussed case with attending physician who agrees with care plan and disposition.   Creatinine elevated from 1.4-2.7, BUN is also elevated at 40. GuaiacIs negative, BUN is likely elevated simply from dehydration.  Case discussed with Dr. Grandville Silos, except admission.    Final Clinical Impressions(s) / ED Diagnoses   Final diagnoses:  Sepsis, due to unspecified organism [facility])  Urinary tract infection with hematuria, site unspecified  Elevated troponin    New Prescriptions New Prescriptions   No medications on  file  Monico Blitz, PA-C 06/13/16 1722    Duffy Bruce, MD 06/14/16 [phone]

## 2016-06-13 NOTE — Progress Notes (Signed)
  Radiation Oncology         [phone]   Name: Brian Montgomery MRN: [id number]   Date: 06/13/2016  DOB: 1936/02/14     Weekly Radiation Therapy Management    ICD-9-CM ICD-10-CM   1. Malignant neoplasm of urinary bladder, unspecified site (HCC) 188.9 C67.9   2. Malignant neoplasm of prostate (HCC) 185 C61     Current Dose: 63 Gy  Planned Dose:  75 Gy  Narrative The patient presents for a work in visit due to loss of control of his lower extremities. The patient has a history of super neural palsy managed by Dr. [name]. He has not been seen by neurology since he began radiotherapy for his bladder and prostate cancer. He apparently was walking with the assistance of a walker until he could not stand Saturday this weekend. He apparently has had several falls leading up to this over the past few weeks as well. He has some uri more frequency, as well as fecal incontinence over the weekend. He denies any shortness of breath or chest pain, increasing dysuria, fevers or chills. He does take pain medication regularly for shoulder pain, and his family has noted several episodes of hematuria being warm to the touch but not documented fevers. He denies any abdominal pain, nausea or vomiting. He denies any new musculoskeletal aches or pains. He denies any headaches, blurred vision or dizziness. No other complaints or verbalized.   Physical Findings  oral temperature is 98.6 F (37 C). His blood pressure is 121/66 and his pulse is 116 (abnormal). His respiration is 18 and oxygen saturation is 100%.   In general this is a chronically ill appearing African-American male in no acute distress. He's alert and oriented x4 and appropriate throughout the examination. Cardiopulmonary assessment is negative for acute distress and he exhibits normal effort. He has intact sensation to soft touch of the lower extremities bilaterally from the dorsal aspect of his feet to the mid thigh. Motor strength is 3 out of 5 in the left  lower extremitiy, and 4 out of 5 in the right lower extremity. He is unable to plantar flex or dorsiflex more so on the left.   Impression Weakness with decreased motor function In the setting of super neural palsy who is being treated for bladder and prostate cancer.    Plan  I'm concerned about patient's symptoms, and whether or not they have something to do with progressive all of the versus possible concern with spinal cord compromise. His imaging studies of the abdomen and pelvis from the summer did not reveal any indication of vertebral body involvement of soft tissue around the spine, and I have discussed this case with Dr. Bennett Scrape in the emergency department. The patient's family is interested in maintaining her care here at [facility] we will proceed to the emergency room for further evaluation. We will continue to proceed with radiotherapy as he is able and he has about 6 treatments to complete his course.         Carola Rhine, PAC

## 2016-06-13 NOTE — H&P (Signed)
History and Physical    Brian Montgomery [id number] DOB: 1935-08-17 DOA: 06/13/2016  PCP: Wyatt Haste, MD  Oncologist: Dr. Alen Blew Patient coming from: ED via [facility]/radiation  Chief Complaint: Generalized weakness  HPI: Brian Montgomery is a 80 y.o. male with medical history significant of muscle invasive high-grade papillary urothelial carcinoma diagnosed in July 2017. Patient had T2 N0 disease presented with a 6 cm mass on 01/21/2016. Patient status post TURBT on 01/21/2016 with definitive radiation therapy with weekly carboplatin started 04/28/2016 who presents from radiation treatment with generalized weakness to the emergency room. On presentation to the ED patient was afebrile initially tachycardic however improved with IV fluids. Patient and daughter give the history. Patient daughters state that patient had been confused with generalized weakness to the point he was unable to ambulate over the past 2-3 days with increasing urinary frequency, subjective fevers, soft stools. They deny any nausea, no vomiting, no chest pain, no shortness of breath, no abdominal pain, no constipation, no melena, no hematemesis, no hematochezia, no cough. During the interview it was noted that patient was actively shivering in the room while eating his dinner.   ED Course: In the ED patient was noted to be afebrile with a tachycardia that responded to IV fluids. Urinalysis which was done had small leukocytes negative nitrite 6030 WBCs and was turbid. Comprehensive metabolic profile obtained at a BUN of 43 creatinine of 2.17 AST of 57 protein of 8.6 otherwise was within normal limits. Ammonia level of 9. Troponin of 0.03. Initial lactic acid was 2.8 to however after IV fluids repeat lactic acid level was 1.3. CBC had a hemoglobin of 12.3 otherwise was within normal limits. Chest x-ray obtained was unremarkable. Plain films of the L-spine obtained was unremarkable. Patient was given a dose of IV vancomycin and  IV Zosyn in the emergency room.[facility] were called to admit the patient for further evaluation and management.   Review of Systems: As per HPI otherwise 10 point review of systems negative.  Past Medical History:  Diagnosis Date  . Arthritis   . Bladder cancer ([place])    infiltrative high grade papillary urothelial carcinoma   . Dyslipidemia   . ED (erectile dysfunction)   . Hypertension   . 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 APPENDECTOMY    . BACK SURGERY    . COLONOSCOPY  2007   Dr.[email or web address]  . CYSTOSCOPY N/A 03/22/2016   Procedure: CYSTOSCOPY;  Surgeon: Raynelle Bring, MD;  Location: [facility];  Service: Urology;  Laterality: N/A;  . CYSTOSCOPY W/ RETROGRADES N/A 01/21/2016   Procedure: CYSTOSCOPY WITH TURBT WITH  CLOT EVACUATION;  Surgeon: Raynelle Bring, MD;  Location: [facility];  Service: Urology;  Laterality: N/A;  . left total knee  2011  . PROSTATE BIOPSY N/A 03/22/2016   Procedure: BIOPSY TRANSRECTAL ULTRASONIC PROSTATE (TUBP);  Surgeon: Raynelle Bring, MD;  Location: [facility];  Service: Urology;  Laterality: N/A;  . TRANSURETHRAL RESECTION OF BLADDER TUMOR N/A 03/22/2016   Procedure: TRANSURETHRAL RESECTION OF BLADDER TUMOR (TURBT);  Surgeon: Raynelle Bring, MD;  Location: [facility];  Service: Urology;  Laterality: N/A;     reports that he quit smoking about 48 years ago. His smoking use included Cigarettes. He has a 0.50 pack-year smoking history. He has never used smokeless tobacco. He reports that he drinks alcohol. He reports that he does not use drugs.  No Known Allergies  Family History  Problem Relation Age of Onset  . Heart failure Mother   . Hypertension Mother   . Heart failure Father   . Hypertension Father   . Renal Disease Sister   . Diabetes Brother    Father deceased from an acute MI. Mother deceased cause unknown possibly an MI per family.   Prior to Admission medications     Medication Sig Start Date End Date Taking? Authorizing Provider  carbidopa-levodopa (SINEMET IR) 25-100 MG tablet TAKE 1/2 TABLET BY MOUTH TWICE DAILY FOR 3 DAYS, 1/2 TABLET THREE TIMES DAILY FOR 3 DAYS, THEN 1 TABLET THREE TIMES DAILY 03/09/16  Yes Pieter Partridge, DO  donepezil (ARICEPT) 5 MG tablet Take 1 tablet (5 mg total) by mouth at bedtime. 04/15/16  Yes Pieter Partridge, DO  HYDROcodone-acetaminophen (NORCO/VICODIN) 5-325 MG tablet Take 1-2 tablets by mouth every 6 (six) hours as needed. Patient taking differently: Take 1-2 tablets by mouth every 6 (six) hours as needed. One to two, po every 4-6 hours prn pain. Qty 60. No refills. Handwritten script provided to patient by Dr. Tammi Klippel on 06/10/16. 06/10/16  Yes Tyler Pita, MD  lisinopril-hydrochlorothiazide (PRINZIDE,ZESTORETIC) 10-12.5 MG tablet Take 1 tablet by mouth daily. 05/28/15  Yes Denita Lung, MD  simvastatin (ZOCOR) 20 MG tablet TAKE 1 TABLET BY MOUTH DAILY 05/27/16  Yes Denita Lung, MD  phenazopyridine (PYRIDIUM) 100 MG tablet Take 1 tablet (100 mg total) by mouth 3 (three) times daily as needed for pain (for burning). Patient not taking: Reported on 06/13/2016 03/22/16   Raynelle Bring, MD  prochlorperazine (COMPAZINE) 10 MG tablet Take 1 tablet (10 mg total) by mouth every 6 (six) hours as needed for nausea or vomiting. Patient not taking: Reported on 06/13/2016 03/16/16   Wyatt Portela, MD    Physical Exam: Vitals:   06/13/16 1630 06/13/16 1700 06/13/16 1730 06/13/16 1811  BP: 107/58 119/72 124/93 125/81  Pulse: 90 93 86 (!) 101  Resp: 24 22 24 20   Temp:    99.1 F (37.3 C)  TempSrc:    Oral  SpO2: 97% 98% 99% 100%  Weight:    74 kg (163 lb 2.3 oz)  Height:    5\' 4"  (1.626 m)      Constitutional: Laying in bed. Shivering. Alert to self place and time. Does not know who the president is thinks is Maudie Flakes.  Vitals:   06/13/16 1630 06/13/16 1700 06/13/16 1730 06/13/16 1811  BP: 107/58 119/72 124/93 125/81  Pulse:  90 93 86 (!) 101  Resp: 24 22 24 20   Temp:    99.1 F (37.3 C)  TempSrc:    Oral  SpO2: 97% 98% 99% 100%  Weight:    74 kg (163 lb 2.3 oz)  Height:    5\' 4"  (1.626 m)   Eyes: PERRLA, lids and conjunctivae normal ENMT: Mucous membranes are dry. Posterior pharynx clear of any exudate or lesions.Normal dentition.  Neck: normal, supple, no masses, no thyromegaly Respiratory: clear to auscultation bilaterally, no wheezing, no crackles. Normal respiratory effort. No accessory muscle use.  Cardiovascular: Regular rate and rhythm, no murmurs / rubs / gallops. No extremity edema. 2+ pedal pulses. No carotid bruits.  Abdomen: no tenderness, no masses palpated. No hepatosplenomegaly. Bowel sounds positive.  Musculoskeletal: no clubbing / cyanosis. No joint deformity upper and lower extremities. Good ROM, no contractures. Normal muscle tone.  Skin: no rashes, lesions, ulcers. No induration Neurologic: CN 2-12 grossly intact. Sensation intact, DTR  normal. Strength 5/5 in all 4.  Psychiatric: Normal judgment and insight. Alert and oriented x 3. Normal mood.    Labs on Admission: I have personally reviewed following labs and imaging studies  CBC:  Recent Labs Lab 06/09/16 0952 06/13/16 1253  WBC 2.9* 8.1  NEUTROABS 2.3 6.9  HGB 11.7* 12.3*  HCT 34.0* 36.2*  MCV 87.4 88.7  PLT 130* [id number]   Basic Metabolic Panel:  Recent Labs Lab 06/09/16 0952 06/13/16 1253  NA 140 139  K 3.7 3.7  CL  --  104  CO2 23 23  GLUCOSE 93 129*  BUN 30.8* 43*  CREATININE 1.4* 2.17*  CALCIUM 9.7 9.3   GFR: Estimated Creatinine Clearance: 25.8 mL/min (by C-G formula based on SCr of 2.17 mg/dL (H)). Liver Function Tests:  Recent Labs Lab 06/09/16 0952 06/13/16 1253  AST 13 57*  ALT 6 24  ALKPHOS 76 77  BILITOT 0.71 1.2  PROT 7.7 8.6*  ALBUMIN 3.2* 3.8   No results for input(s): LIPASE, AMYLASE in the last 168 hours.  Recent Labs Lab 06/13/16 1253  AMMONIA 9   Coagulation Profile: No  results for input(s): INR, PROTIME in the last 168 hours. Cardiac Enzymes:  Recent Labs Lab 06/13/16 1253  TROPONINI 0.03*   BNP (last 3 results) No results for input(s): PROBNP in the last 8760 hours. HbA1C: No results for input(s): HGBA1C in the last 72 hours. CBG:  Recent Labs Lab 06/13/16 1307  GLUCAP 119*   Lipid Profile: No results for input(s): CHOL, HDL, LDLCALC, TRIG, CHOLHDL, LDLDIRECT in the last 72 hours. Thyroid Function Tests: No results for input(s): TSH, T4TOTAL, FREET4, T3FREE, THYROIDAB in the last 72 hours. Anemia Panel: No results for input(s): VITAMINB12, FOLATE, FERRITIN, TIBC, IRON, RETICCTPCT in the last 72 hours. Urine analysis:    Component Value Date/Time   COLORURINE AMBER (A) 06/13/2016 1212   APPEARANCEUR TURBID (A) 06/13/2016 1212   LABSPEC 1.020 06/13/2016 1212   PHURINE 5.0 06/13/2016 1212   GLUCOSEU NEGATIVE 06/13/2016 1212   HGBUR MODERATE (A) 06/13/2016 1212   BILIRUBINUR SMALL (A) 06/13/2016 1212   BILIRUBINUR n 12/02/2015 1535   KETONESUR NEGATIVE 06/13/2016 1212   PROTEINUR 100 (A) 06/13/2016 1212   UROBILINOGEN negative 12/02/2015 1535   UROBILINOGEN 0.2 02/17/2010 1259   NITRITE NEGATIVE 06/13/2016 1212   LEUKOCYTESUR SMALL (A) 06/13/2016 1212   Sepsis Labs: !!!!!!!!!!!!!!!!!!!!!!!!!!!!!!!!!!!!!!!!!!!! @LABRCNTIP (procalcitonin:4,lacticidven:4) )No results found for this or any previous visit (from the past 240 hour(s)).   Radiological Exams on Admission: Dg Chest 2 View  Result Date: 06/13/2016 CLINICAL DATA:  Tachycardia, weakness and dizziness. EXAM: CHEST  2 VIEW COMPARISON:  None. FINDINGS: The heart size and mediastinal contours are within normal limits. Both lungs are clear. The visualized skeletal structures are unremarkable. IMPRESSION: No active cardiopulmonary disease. Electronically Signed   By: Fidela Salisbury M.D.   On: 06/13/2016 14:29   Dg Lumbar Spine Complete  Result Date: 06/13/2016 CLINICAL DATA:   Bilateral lower extremity weakness. Unsteady gait. Altered mental status. Recent radiation treatment for prostate cancer. EXAM: LUMBAR SPINE - COMPLETE 4+ VIEW COMPARISON:  CT abdomen and pelvis 01/29/2016 FINDINGS: There 5 non rib-bearing lumbar type vertebral bodies. Vertebral body heights are preserved without evidence of fracture. Moderate disc space narrowing is present at L4-5 and L5-S1, with mild disc space narrowing more proximally in the lumbar spine. Vacuum disc is apparent at L2-3, L3-4, and likely L1-2. Mild-to-moderate marginal endplate osteophyte formation is present throughout the lumbar spine. No pars defects  are identified. There is mild-to-moderate mid to lower lumbar spine facet arthrosis. Fiducials overlie the prostate. Aortic atherosclerosis is noted. IMPRESSION: 1. No evidence of acute osseous abnormality. 2. Moderate lumbar disc degeneration. 3. Aortic atherosclerosis. Electronically Signed   By: Logan Bores M.D.   On: 06/13/2016 14:31   Ct Head Wo Contrast  Result Date: 06/13/2016 CLINICAL DATA:  Altered mental status.  Bilateral leg weakness EXAM: CT HEAD WITHOUT CONTRAST CT CERVICAL SPINE WITHOUT CONTRAST TECHNIQUE: Multidetector CT imaging of the head and cervical spine was performed following the standard protocol without intravenous contrast. Multiplanar CT image reconstructions of the cervical spine were also generated. COMPARISON:  MRI head 10/27/2014.  MRI cervical spine 09/01/2015 FINDINGS: CT HEAD FINDINGS Brain: Generalized atrophy. Chronic microvascular ischemic changes in the white matter. Negative for acute infarct. Negative for hemorrhage or mass. Vascular: Negative for dense MCA. Skull: Negative for fracture Sinuses/Orbits: With negative Other: None CT CERVICAL SPINE FINDINGS Alignment: 3 mm anterior listhesis at C7-T1. Remaining alignment normal. Straightening of the cervical lordosis Skull base and vertebrae: Negative for fracture. Soft tissues and spinal canal:  Atherosclerotic calcification. No soft tissue mass or swelling Disc levels: Disc degeneration and spurring at C3-4, C4-5, C5-6, and C6-7 of a moderate degree. Degenerative anterolisthesis C7-T1 due to advanced facet degeneration. Upper chest: Negative Other: None IMPRESSION: Atrophy and chronic microvascular ischemic change. No acute intracranial abnormality Moderate cervical degenerative change. Negative for cervical spine fracture. Electronically Signed   By: Franchot Gallo M.D.   On: 06/13/2016 14:06   Ct Cervical Spine Wo Contrast  Result Date: 06/13/2016 CLINICAL DATA:  Altered mental status.  Bilateral leg weakness EXAM: CT HEAD WITHOUT CONTRAST CT CERVICAL SPINE WITHOUT CONTRAST TECHNIQUE: Multidetector CT imaging of the head and cervical spine was performed following the standard protocol without intravenous contrast. Multiplanar CT image reconstructions of the cervical spine were also generated. COMPARISON:  MRI head 10/27/2014.  MRI cervical spine 09/01/2015 FINDINGS: CT HEAD FINDINGS Brain: Generalized atrophy. Chronic microvascular ischemic changes in the white matter. Negative for acute infarct. Negative for hemorrhage or mass. Vascular: Negative for dense MCA. Skull: Negative for fracture Sinuses/Orbits: With negative Other: None CT CERVICAL SPINE FINDINGS Alignment: 3 mm anterior listhesis at C7-T1. Remaining alignment normal. Straightening of the cervical lordosis Skull base and vertebrae: Negative for fracture. Soft tissues and spinal canal: Atherosclerotic calcification. No soft tissue mass or swelling Disc levels: Disc degeneration and spurring at C3-4, C4-5, C5-6, and C6-7 of a moderate degree. Degenerative anterolisthesis C7-T1 due to advanced facet degeneration. Upper chest: Negative Other: None IMPRESSION: Atrophy and chronic microvascular ischemic change. No acute intracranial abnormality Moderate cervical degenerative change. Negative for cervical spine fracture. Electronically Signed    By: Franchot Gallo M.D.   On: 06/13/2016 14:06    EKG: Independently reviewed. Sinus tachycardia. Heart rate 112. No ischemic changes noted.   Assessment/Plan Principal Problem:   Weakness generalized Active Problems:   Essential hypertension, benign   Hyperlipidemia with target LDL less than 100   Gait instability   Bladder cancer ([facility])   Malignant neoplasm of prostate ([facility])   Dehydration   Elevated troponin   ARF (acute renal failure) ([place])   #1 generalized weakness Concern for infectious etiology as patient is actively shivering in the room. Patient afebrile on admission. Patient had a tachycardia that responded to IV fluids. Urinalysis turbid however nitrite negative small leukocytes 6-30 WBCs. Check blood cultures 2. Check urine cultures. Chest x-ray obtained in the ED was unremarkable however will repeat  chest x-ray in the morning after hydration. Due to concerns for active infection will place patient empirically on IV Zosyn and IV vancomycin pending culture results. PT/OT Follow.  #2 hypertension Blood pressure seems stable. Not elevated. Patient seems dehydrated on examination and in acute renal failure and a such will hold patient's ACE inhibitor and diuretic. Follow for now.  #3 dehydration IV fluids.  #4 acute renal failure Patient noted to have a BUN of 43 and a creatinine of 2.14. Creatinine was 1.4 on 06/09/2016. Likely secondary to a prerenal azotemia. Check a urine sodium and a urine creatinine. Check a renal ultrasound. Hydrate with IV fluids. Hold ACE inhibitor and diuretics for now. Follow. If worsening renal function with no etiology may need a nephrology consultation.  #5 elevated troponin Troponins are barely elevated patient with no chest pain. EKG with tachycardia with no ischemic changes. Likely secondary to demand. Will cycle enzymes and follow.  #6 gait instability Continue Sinemet and Aricept. PT/OT.  #7 muscle invasive high-grade papillary  urothelial carcinoma status post TURBT(01/21/3016)  patient currently undergoing definitive radiation therapy with weekly carboplatin started 04/28/2016. Will inform oncology and radiation oncology of patient's admission. Follow for now.    DVT prophylaxis: Lovenox Code Status: Full Family Communication: Updated daughters at bedside. Disposition Plan: Home versus skilled nursing facility pending hospitalization and PT evaluation. Consults called: None Admission status: Admit to inpatient   [facility] MD [facility] Pager [phone]  If 7PM-7AM, please contact night-coverage [email or web address] Password [email or web address]  06/13/2016, 7:38 PM

## 2016-06-13 NOTE — Progress Notes (Signed)
Patient presented to the clinic with his daughter and son in law requesting to be evaluated. Patient unable to stand or walk independently since Saturday. Daughters report the patient was picked up and put in the car to come in for treatment today by his son in law. Patient confirms he can feel this RN touch the back of his legs. Patient confirms he can feel the urge to void and defecate. Family reports episodes of incontinence of bowel and bladder because the patient is unable to transfer to the restroom. Patient denies headache, nausea or vomiting. Patient reports dizziness. Reports taking hydrocodone/acetaminophen for bilateral shoulder pain. It was noted on Friday during PUT visit the patient had fallen several time during the week. Heart rate elevated. Patient appears very drowsy.   BP 121/66 (BP Location: Right Arm, Patient Position: Sitting, Cuff Size: Normal)   Pulse (!) 116   Temp 98.6 F (37 C) (Oral)   Resp 18   SpO2 100%  Wt Readings from Last 3 Encounters:  06/10/16 169 lb 9.6 oz (76.9 kg)  06/01/16 171 lb 3.2 oz (77.7 kg)  05/26/16 173 lb (78.5 kg)

## 2016-06-13 NOTE — ED Notes (Signed)
RN/PA notified of abnormal lab

## 2016-06-13 NOTE — ED Notes (Signed)
Bed: WA22 Expected date:  Expected time:  Means of arrival:  Comments: Cancer center 

## 2016-06-13 NOTE — ED Notes (Signed)
Per RN, states history of prostate and bladder cancer, states receiving radiation-states unable to walk-new onset

## 2016-06-13 NOTE — Progress Notes (Signed)
Transported patient to [facility] emergency room, bed 22 via stretcher. Before transport transferred patient on stretcher, removed soiled clothing, and dressed patient in gown. Provided report to Lilia Pro, Pension scheme manager for [employer], Therapist, sports. Daughter at bedside. Patient alert and in no distress when I left.

## 2016-06-13 NOTE — Progress Notes (Addendum)
Pharmacy Antibiotic Note  Brian Montgomery is a 80 y.o. male admitted on 06/13/2016 with possible sepsis. PMH significant for prostate and bladder cancer sent from [name] for new onset inability to walk and somnolence. Pharmacy has been consulted for Vancomycin and Zosyn dosing. Initial doses ordered by ED provider.  Plan: Zosyn 3.375g IV x 1 over 30 minutes per ED provider, then Zosyn 3.375g IV q8h (infuse over 4 hours). Vancomycin 1g IV x 1 as ordered per ED provider, then 750mg  IV q24h. Plan for Vancomycin trough level at steady state. Goal trough level 15-20 mcg/mL. Monitor renal function, cultures, clinical course.  Height: 5\' 5"  (165.1 cm) Weight: 169 lb (76.7 kg) IBW/kg (Calculated) : 61.5  Temp (24hrs), Avg:98.7 F (37.1 C), Min:98.6 F (37 C), Max:98.8 F (37.1 C)   Recent Labs Lab 06/09/16 0952 06/09/16 0952 06/13/16 1253 06/13/16 1310  WBC 2.9*  --  8.1  --   CREATININE  --  1.4* 2.17*  --   LATICACIDVEN  --   --   --  2.82*    Estimated Creatinine Clearance: 26.8 mL/min (by C-G formula based on SCr of 2.17 mg/dL (H)).    No Known Allergies  Antimicrobials this admission: 12/4 >> Vancomycin >> 12/4 >> Zosyn >>  Dose adjustments this admission: --  Microbiology results: 12/4 BCx: sent 12/4 UCx: sent    Thank you for allowing pharmacy to be a part of this patient's care.   Lindell Spar, PharmD, BCPS Pager: [phone] 06/13/2016 3:28 PM

## 2016-06-13 NOTE — Progress Notes (Signed)
Attempted to orthostatic VS- pt unable to hold himself in an upright position when sitting at side of bed.  Temp 103.1 presently.

## 2016-06-14 ENCOUNTER — Inpatient Hospital Stay (HOSPITAL_COMMUNITY): Payer: Medicare Other

## 2016-06-14 ENCOUNTER — Ambulatory Visit
Admission: RE | Admit: 2016-06-14 | Discharge: 2016-06-14 | Disposition: A | Discharge status: Home / Self Care | Payer: Medicare Other | Source: Ambulatory Visit | Attending: Radiation Oncology | Admitting: Radiation Oncology

## 2016-06-14 DIAGNOSIS — A419 Sepsis, unspecified organism: Principal | ICD-10-CM

## 2016-06-14 DIAGNOSIS — C672 Malignant neoplasm of lateral wall of bladder: Secondary | ICD-10-CM | POA: Diagnosis not present

## 2016-06-14 LAB — COMPREHENSIVE METABOLIC PANEL
ALBUMIN: 3.1 g/dL — AB (ref 3.5–5.0)
ALK PHOS: 66 U/L (ref 38–126)
ALT: 13 U/L — ABNORMAL LOW (ref 17–63)
ANION GAP: 10 (ref 5–15)
AST: 68 U/L — ABNORMAL HIGH (ref 15–41)
BILIRUBIN TOTAL: 1 mg/dL (ref 0.3–1.2)
BUN: 41 mg/dL — ABNORMAL HIGH (ref 6–20)
CALCIUM: 8.3 mg/dL — AB (ref 8.9–10.3)
CO2: 19 mmol/L — ABNORMAL LOW (ref 22–32)
Chloride: 109 mmol/L (ref 101–111)
Creatinine, Ser: 2.06 mg/dL — ABNORMAL HIGH (ref 0.61–1.24)
GFR calc non Af Amer: 29 mL/min — ABNORMAL LOW (ref >60)
GFR, EST AFRICAN AMERICAN: 34 mL/min — AB (ref >60)
GLUCOSE: 104 mg/dL — AB (ref 65–99)
POTASSIUM: 3.4 mmol/L — AB (ref 3.5–5.1)
Sodium: 138 mmol/L (ref 135–145)
TOTAL PROTEIN: 7 g/dL (ref 6.5–8.1)

## 2016-06-14 LAB — MAGNESIUM: MAGNESIUM: 1.7 mg/dL (ref 1.7–2.4)

## 2016-06-14 LAB — GLUCOSE, CAPILLARY: GLUCOSE-CAPILLARY: 91 mg/dL (ref 65–99)

## 2016-06-14 LAB — CBC
HEMATOCRIT: 31.9 % — AB (ref 39.0–52.0)
HEMOGLOBIN: 10.8 g/dL — AB (ref 13.0–17.0)
MCH: 29.9 pg (ref 26.0–34.0)
MCHC: 33.9 g/dL (ref 30.0–36.0)
MCV: 88.4 fL (ref 78.0–100.0)
Platelets: 163 10*3/uL (ref 150–400)
RBC: 3.61 MIL/uL — AB (ref 4.22–5.81)
RDW: 16.3 % — ABNORMAL HIGH (ref 11.5–15.5)
WBC: 6.8 10*3/uL (ref 4.0–10.5)

## 2016-06-14 LAB — TROPONIN I
TROPONIN I: 0.04 ng/mL — AB (ref <0.03)
Troponin I: 0.03 ng/mL (ref <0.03)

## 2016-06-14 LAB — LACTIC ACID, PLASMA: LACTIC ACID, VENOUS: 1.1 mmol/L (ref 0.5–1.9)

## 2016-06-14 LAB — PROTIME-INR
INR: 1.14
PROTHROMBIN TIME: 14.6 s (ref 11.4–15.2)

## 2016-06-14 MED ORDER — POTASSIUM CHLORIDE CRYS ER 20 MEQ PO TBCR
40.0000 meq | EXTENDED_RELEASE_TABLET | Freq: Once | ORAL | Status: AC
  Administered 2016-06-14: 40 meq via ORAL
  Filled 2016-06-14: qty 2

## 2016-06-14 MED ORDER — CARBIDOPA-LEVODOPA 25-100 MG PO TABS
1.0000 | ORAL_TABLET | Freq: Three times a day (TID) | ORAL | Status: DC
  Administered 2016-06-14 – 2016-06-17 (×9): 1 via ORAL
  Filled 2016-06-14 (×9): qty 1

## 2016-06-14 MED ORDER — MAGNESIUM SULFATE 4 GM/100ML IV SOLN
4.0000 g | Freq: Once | INTRAVENOUS | Status: AC
  Administered 2016-06-14: 4 g via INTRAVENOUS
  Filled 2016-06-14: qty 100

## 2016-06-14 NOTE — Progress Notes (Signed)
PROGRESS NOTE    Brian Montgomery  [id number] DOB: 1936-05-15 DOA: 06/13/2016 PCP: Wyatt Haste, MD    Brief Narrative:  Brian Montgomery is a 80 year old gentleman history of muscle invasive high-grade papillary urothelial carcinoma diagnosed in July 2017 status post TURBT on 01/21/2016 receiving definitive radiation therapy with weekly carboplatin presented from radiation treatment with generalized weakness. Patient also noted to be septic.   Assessment & Plan:   Principal Problem:   Sepsis ([place]) Active Problems:   Essential hypertension, benign   Hyperlipidemia with target LDL less than 100   Gait instability   Bladder cancer ([facility])   Malignant neoplasm of prostate ([facility])   Dehydration   Weakness generalized   Elevated troponin   ARF (acute renal failure) ([place])  #1 sepsis Patient was admitted with generalized weakness however after being placed in the hospital bed it was noted that patient spiked a temperature of 103.1, tachycardia with heart rate of 129, respiratory rate of 36 meeting criteria for sepsis. Etiology unknown. Patient has been pancultured and results pending. Repeat chest x-ray pending for this morning. Clinical improvement. Afebrile. Continue empiric IV vancomycin and IV Zosyn. Follow.  #2 generalized weakness Likely secondary to problem #1. Patient has been pancultured. Repeat chest x-ray pending. Continue IV fluids. PT/OT.  #3 acute renal failure Likely secondary to prerenal azotemia in the setting of ACE inhibitor and diuretic. Urine sodium of 95. Urine creatinine of 101.66. Renal ultrasound pending. Creatinine trending down. IV fluids. Supportive care.  #4 elevated troponin Minimally elevated troponin. Likely secondary to problem #1. EKG with no ischemic changes. Follow.  #5 dehydration IV fluids.  #6 hypertension Blood pressure stable. ACE inhibitor and diuretic on hold secondary to acute renal failure and dehydration. Follow for now.  #7 gait  instability Continue Sinemet and Aricept. PT/OT.  #8 muscle invasive high-grade papillary urothelial carcinoma status post TURBT (01/21/2016) Patient currently undergoing definitive radiation therapy with weekly carboplatin started 04/28/2016. Patient received radiation today. Oncology has been informed of admission via Epic.   DVT prophylaxis: Lovenox Code Status: Full Family Communication: Updated patient, wife, daughters at bedside. Disposition Plan: Home versus skilled nursing facility when medically stable pending PT evaluation.   Consultants:   None  Procedures:   CT head/ CT c spine 06/13/2016  Chest x-ray 06/13/2016  Plain films of the L-spine 06/13/2016  Antimicrobials:   IV vancomycin 06/13/2016    IV Zosyn 06/13/2016   Subjective: Patient laying in bed has just returned from radiation treatment. Denies any chest pain. No shortness of breath. Feeling better. Patient noted to have a temperature of 103 overnight with tachycardia with heart rates in the 120s to the 130s and respiratory rate in the 30s.  Objective: Vitals:   06/13/16 1811 06/13/16 2046 06/14/16 0126 06/14/16 0522  BP: 125/81 130/68 111/65 137/72  Pulse: (!) 101 (!) 124 (!) 101 (!) 102  Resp: 20 (!) 36 18 16  Temp: 99.1 F (37.3 C) (!) 103.1 F (39.5 C) 98.4 F (36.9 C) 98.8 F (37.1 C)  TempSrc: Oral Oral Oral Oral  SpO2: 100% 98% 100% 99%  Weight: 74 kg (163 lb 2.3 oz)   74.8 kg (164 lb 14.5 oz)  Height: 5\' 4"  (1.626 m)       Intake/Output Summary (Last 24 hours) at 06/14/16 1106 Last data filed at 06/14/16 0915  Gross per 24 hour  Intake          6418.33 ml  Output  650 ml  Net          5768.33 ml   Filed Weights   06/13/16 1213 06/13/16 1811 06/14/16 0522  Weight: 76.7 kg (169 lb) 74 kg (163 lb 2.3 oz) 74.8 kg (164 lb 14.5 oz)    Examination:  General exam: Appears calm and comfortable  Respiratory system: Clear to auscultation anterior lung fields. Respiratory  effort normal. Cardiovascular system: S1 & S2 heard, RRR. No JVD, murmurs, rubs, gallops or clicks. No pedal edema. Gastrointestinal system: Abdomen is nondistended, soft and nontender. No organomegaly or masses felt. Normal bowel sounds heard. Central nervous system: Alert and oriented. No focal neurological deficits. Extremities: Symmetric 5 x 5 power. Skin: No rashes, lesions or ulcers Psychiatry: Judgement and insight appear normal. Mood & affect appropriate.     Data Reviewed: I have personally reviewed following labs and imaging studies  CBC:  Recent Labs Lab 06/09/16 0952 06/13/16 1253 06/14/16 0159  WBC 2.9* 8.1 6.8  NEUTROABS 2.3 6.9  --   HGB 11.7* 12.3* 10.8*  HCT 34.0* 36.2* 31.9*  MCV 87.4 88.7 88.4  PLT 130* 174 [id number]   Basic Metabolic Panel:  Recent Labs Lab 06/09/16 0952 06/13/16 1253 06/14/16 0159  NA 140 139 138  K 3.7 3.7 3.4*  CL  --  104 109  CO2 23 23 19*  GLUCOSE 93 129* 104*  BUN 30.8* 43* 41*  CREATININE 1.4* 2.17* 2.06*  CALCIUM 9.7 9.3 8.3*  MG  --   --  1.7   GFR: Estimated Creatinine Clearance: 27.3 mL/min (by C-G formula based on SCr of 2.06 mg/dL (H)). Liver Function Tests:  Recent Labs Lab 06/09/16 [id number] 06/13/16 1253 06/14/16 0159  AST 13 57* 68*  ALT 6 24 13*  ALKPHOS 76 77 66  BILITOT 0.71 1.2 1.0  PROT 7.7 8.6* 7.0  ALBUMIN 3.2* 3.8 3.1*   No results for input(s): LIPASE, AMYLASE in the last 168 hours.  Recent Labs Lab 06/13/16 1253  AMMONIA 9   Coagulation Profile:  Recent Labs Lab 06/14/16 0159  INR 1.14   Cardiac Enzymes:  Recent Labs Lab 06/13/16 1253 06/13/16 2016 06/14/16 0159 06/14/16 0822  TROPONINI 0.03* 0.03* 0.04* 0.03*   BNP (last 3 results) No results for input(s): PROBNP in the last 8760 hours. HbA1C: No results for input(s): HGBA1C in the last 72 hours. CBG:  Recent Labs Lab 06/13/16 1307 06/14/16 0741  GLUCAP 119* 91   Lipid Profile: No results for input(s): CHOL, HDL,  LDLCALC, TRIG, CHOLHDL, LDLDIRECT in the last 72 hours. Thyroid Function Tests:  Recent Labs  06/13/16 2016  TSH 0.856   Anemia Panel: No results for input(s): VITAMINB12, FOLATE, FERRITIN, TIBC, IRON, RETICCTPCT in the last 72 hours. Sepsis Labs:  Recent Labs Lab 06/13/16 1310 06/13/16 1844 06/13/16 2016 06/13/16 2246 06/14/16 0159  PROCALCITON  --   --   --  3.23  --   LATICACIDVEN 2.82* 1.3 1.2  --  1.1    No results found for this or any previous visit (from the past 240 hour(s)).       Radiology Studies: Dg Chest 2 View  Result Date: 06/13/2016 CLINICAL DATA:  Tachycardia, weakness and dizziness. EXAM: CHEST  2 VIEW COMPARISON:  None. FINDINGS: The heart size and mediastinal contours are within normal limits. Both lungs are clear. The visualized skeletal structures are unremarkable. IMPRESSION: No active cardiopulmonary disease. Electronically Signed   By: Fidela Salisbury M.D.   On: 06/13/2016 14:29   Dg  Lumbar Spine Complete  Result Date: 06/13/2016 CLINICAL DATA:  Bilateral lower extremity weakness. Unsteady gait. Altered mental status. Recent radiation treatment for prostate cancer. EXAM: LUMBAR SPINE - COMPLETE 4+ VIEW COMPARISON:  CT abdomen and pelvis 01/29/2016 FINDINGS: There 5 non rib-bearing lumbar type vertebral bodies. Vertebral body heights are preserved without evidence of fracture. Moderate disc space narrowing is present at L4-5 and L5-S1, with mild disc space narrowing more proximally in the lumbar spine. Vacuum disc is apparent at L2-3, L3-4, and likely L1-2. Mild-to-moderate marginal endplate osteophyte formation is present throughout the lumbar spine. No pars defects are identified. There is mild-to-moderate mid to lower lumbar spine facet arthrosis. Fiducials overlie the prostate. Aortic atherosclerosis is noted. IMPRESSION: 1. No evidence of acute osseous abnormality. 2. Moderate lumbar disc degeneration. 3. Aortic atherosclerosis. Electronically  Signed   By: Logan Bores M.D.   On: 06/13/2016 14:31   Ct Head Wo Contrast  Result Date: 06/13/2016 CLINICAL DATA:  Altered mental status.  Bilateral leg weakness EXAM: CT HEAD WITHOUT CONTRAST CT CERVICAL SPINE WITHOUT CONTRAST TECHNIQUE: Multidetector CT imaging of the head and cervical spine was performed following the standard protocol without intravenous contrast. Multiplanar CT image reconstructions of the cervical spine were also generated. COMPARISON:  MRI head 10/27/2014.  MRI cervical spine 09/01/2015 FINDINGS: CT HEAD FINDINGS Brain: Generalized atrophy. Chronic microvascular ischemic changes in the white matter. Negative for acute infarct. Negative for hemorrhage or mass. Vascular: Negative for dense MCA. Skull: Negative for fracture Sinuses/Orbits: With negative Other: None CT CERVICAL SPINE FINDINGS Alignment: 3 mm anterior listhesis at C7-T1. Remaining alignment normal. Straightening of the cervical lordosis Skull base and vertebrae: Negative for fracture. Soft tissues and spinal canal: Atherosclerotic calcification. No soft tissue mass or swelling Disc levels: Disc degeneration and spurring at C3-4, C4-5, C5-6, and C6-7 of a moderate degree. Degenerative anterolisthesis C7-T1 due to advanced facet degeneration. Upper chest: Negative Other: None IMPRESSION: Atrophy and chronic microvascular ischemic change. No acute intracranial abnormality Moderate cervical degenerative change. Negative for cervical spine fracture. Electronically Signed   By: Franchot Gallo M.D.   On: 06/13/2016 14:06   Ct Cervical Spine Wo Contrast  Result Date: 06/13/2016 CLINICAL DATA:  Altered mental status.  Bilateral leg weakness EXAM: CT HEAD WITHOUT CONTRAST CT CERVICAL SPINE WITHOUT CONTRAST TECHNIQUE: Multidetector CT imaging of the head and cervical spine was performed following the standard protocol without intravenous contrast. Multiplanar CT image reconstructions of the cervical spine were also generated.  COMPARISON:  MRI head 10/27/2014.  MRI cervical spine 09/01/2015 FINDINGS: CT HEAD FINDINGS Brain: Generalized atrophy. Chronic microvascular ischemic changes in the white matter. Negative for acute infarct. Negative for hemorrhage or mass. Vascular: Negative for dense MCA. Skull: Negative for fracture Sinuses/Orbits: With negative Other: None CT CERVICAL SPINE FINDINGS Alignment: 3 mm anterior listhesis at C7-T1. Remaining alignment normal. Straightening of the cervical lordosis Skull base and vertebrae: Negative for fracture. Soft tissues and spinal canal: Atherosclerotic calcification. No soft tissue mass or swelling Disc levels: Disc degeneration and spurring at C3-4, C4-5, C5-6, and C6-7 of a moderate degree. Degenerative anterolisthesis C7-T1 due to advanced facet degeneration. Upper chest: Negative Other: None IMPRESSION: Atrophy and chronic microvascular ischemic change. No acute intracranial abnormality Moderate cervical degenerative change. Negative for cervical spine fracture. Electronically Signed   By: Franchot Gallo M.D.   On: 06/13/2016 14:06        Scheduled Meds: . carbidopa-levodopa  1 tablet Oral TID  . donepezil  5 mg Oral QHS  .  enoxaparin (LOVENOX) injection  30 mg Subcutaneous Q24H  . magnesium sulfate 1 - 4 g bolus IVPB  4 g Intravenous Once  . piperacillin-tazobactam (ZOSYN)  IV  3.375 g Intravenous Q8H  . sodium chloride  1,000 mL Intravenous Once   And  . sodium chloride  1,000 mL Intravenous Once   And  . sodium chloride  250 mL Intravenous Once  . sodium chloride flush  3 mL Intravenous Q12H  . vancomycin  750 mg Intravenous Q24H   Continuous Infusions: . sodium chloride 100 mL/hr at 06/14/16 0524     LOS: 1 day    Time spent: 5 mins    Alter Moss, MD [facility] Pager [phone]  If 7PM-7AM, please contact night-coverage [email or web address] Password [email or web address] 06/14/2016, 11:06 AM

## 2016-06-14 NOTE — Progress Notes (Signed)
Spoke with Shirlee Limerick, RN caring for patient on [address]. She confirms the patient is stable and willing to receive radiation therapy today. She understands transporters will present at 0945 to bring him down for xrt.

## 2016-06-14 NOTE — Evaluation (Signed)
Physical Therapy Evaluation Patient Details Name: Brian Montgomery MRN: [id number] DOB: [birth year] Today's Date: 06/14/2016   History of Present Illness  Brian Montgomery is a 80 year old gentleman history of muscle invasive high-grade papillary urothelial carcinoma diagnosed in July 2017 status post TURBT on 01/21/2016 receiving definitive radiation therapy with weekly carboplatin presented from radiation treatment with generalized weakness. Patient also noted to be septic.  Clinical Impression  Pt admitted with above diagnosis. Pt currently with functional limitations due to the deficits listed below (see PT Problem List).  Pt will benefit from skilled PT to increase their independence and safety with mobility to allow discharge to the venue listed below.   Recommend SNF, discussed with wife--pt has been needing increasingly more assist at home these last few wks;    Follow Up Recommendations SNF;Supervision/Assistance - 24 hour    Equipment Recommendations  None recommended by PT    Recommendations for Other Services       Precautions / Restrictions Precautions Precautions: Fall Restrictions Weight Bearing Restrictions: No      Mobility  Bed Mobility Overal bed mobility: Needs Assistance Bed Mobility: Supine to Sit     Supine to sit: Mod assist     General bed mobility comments: assist to elevate trunk, incr time  Transfers Overall transfer level: Needs assistance   Transfers: Sit to/from Stand Sit to Stand: Mod assist;Max assist         General transfer comment: multi-modal cues for hand placement, anterior wt shift  Ambulation/Gait Ambulation/Gait assistance: Mod assist;Max assist Ambulation Distance (Feet): 10 Feet (x2) Assistive device: Rolling walker (2 wheeled) Gait Pattern/deviations: Step-to pattern;Shuffle;Trunk flexed;Decreased step length - right;Decreased step length - left Gait velocity: amb to bathroom, pt incontinent of stool in bed   General Gait  Details: multi-modal cues for RW position, posture, step length, safety; assist throughout for wt shift, RW position, and balance  Stairs            Wheelchair Mobility    Modified Rankin (Stroke Patients Only)       Balance Overall balance assessment: Needs assistance   Sitting balance-Leahy Scale: Poor   Postural control: Posterior lean   Standing balance-Leahy Scale: Zero                               Pertinent Vitals/Pain Pain Assessment: No/denies pain    Home Living Family/patient expects to be discharged to:: Private residence Living Arrangements: Spouse/significant other Available Help at Discharge: Family Type of Home: House Home Access: Stairs to enter   Technical brewer of Steps: 2 Home Layout: One level Home Equipment: Environmental consultant - 2 wheels;Shower seat      Prior Function Level of Independence: Independent;Needs assistance      ADL's / Homemaking Assistance Needed: assist with showers  for ~19mos        Hand Dominance        Extremity/Trunk Assessment   Upper Extremity Assessment: Generalized weakness           Lower Extremity Assessment: Generalized weakness         Communication   Communication: No difficulties  Cognition Arousal/Alertness: Awake/alert Behavior During Therapy: WFL for tasks assessed/performed Overall Cognitive Status: Impaired/Different from baseline Area of Impairment: Following commands;Memory;Problem solving     Memory: Decreased short-term memory Following Commands: Follows one step commands with increased time     Problem Solving: Slow processing;Decreased initiation;Difficulty sequencing;Requires verbal cues;Requires tactile cues General Comments: wife answers  most questions, pt has difficulty processing/recalling PLOF    General Comments      Exercises     Assessment/Plan    PT Assessment Patient needs continued PT services  PT Problem List Decreased strength;Decreased range  of motion;Decreased activity tolerance;Decreased mobility;Decreased knowledge of use of DME          PT Treatment Interventions DME instruction;Gait training;Functional mobility training;Therapeutic activities;Therapeutic exercise;Patient/family education    PT Goals (Current goals can be found in the Care Plan section)  Acute Rehab PT Goals Patient Stated Goal: none stated PT Goal Formulation: With patient/family Time For Goal Achievement: 06/28/16 Potential to Achieve Goals: Fair    Frequency Min 3X/week   Barriers to discharge        Co-evaluation               End of Session Equipment Utilized During Treatment: Gait belt Activity Tolerance: Patient limited by fatigue;Patient tolerated treatment well Patient left: with call bell/phone within reach;in bed;with bed alarm set           Time: 1410-1438 PT Time Calculation (min) (ACUTE ONLY): 28 min   Charges:   PT Evaluation $PT Eval Low Complexity: 1 Procedure PT Treatments $Gait Training: 8-22 mins   PT G Codes:        Brian Montgomery 2016-07-04, 2:59 PM

## 2016-06-15 ENCOUNTER — Other Ambulatory Visit: Payer: Medicare Other

## 2016-06-15 ENCOUNTER — Inpatient Hospital Stay (HOSPITAL_COMMUNITY): Payer: Medicare Other

## 2016-06-15 ENCOUNTER — Ambulatory Visit
Admission: RE | Admit: 2016-06-15 | Discharge: 2016-06-15 | Disposition: A | Discharge status: Home / Self Care | Payer: Medicare Other | Source: Ambulatory Visit | Attending: Radiation Oncology | Admitting: Radiation Oncology

## 2016-06-15 ENCOUNTER — Ambulatory Visit: Payer: Medicare Other | Admitting: Oncology

## 2016-06-15 DIAGNOSIS — C679 Malignant neoplasm of bladder, unspecified: Secondary | ICD-10-CM

## 2016-06-15 DIAGNOSIS — Z510 Encounter for antineoplastic radiation therapy: Secondary | ICD-10-CM | POA: Insufficient documentation

## 2016-06-15 DIAGNOSIS — I10 Essential (primary) hypertension: Secondary | ICD-10-CM

## 2016-06-15 DIAGNOSIS — C672 Malignant neoplasm of lateral wall of bladder: Secondary | ICD-10-CM | POA: Diagnosis not present

## 2016-06-15 DIAGNOSIS — R7989 Other specified abnormal findings of blood chemistry: Secondary | ICD-10-CM

## 2016-06-15 DIAGNOSIS — C61 Malignant neoplasm of prostate: Secondary | ICD-10-CM | POA: Insufficient documentation

## 2016-06-15 DIAGNOSIS — R748 Abnormal levels of other serum enzymes: Secondary | ICD-10-CM

## 2016-06-15 LAB — BASIC METABOLIC PANEL
ANION GAP: 6 (ref 5–15)
BUN: 32 mg/dL — ABNORMAL HIGH (ref 6–20)
CALCIUM: 8 mg/dL — AB (ref 8.9–10.3)
CO2: 20 mmol/L — ABNORMAL LOW (ref 22–32)
CREATININE: 1.75 mg/dL — AB (ref 0.61–1.24)
Chloride: 109 mmol/L (ref 101–111)
GFR, EST AFRICAN AMERICAN: 41 mL/min — AB (ref >60)
GFR, EST NON AFRICAN AMERICAN: 36 mL/min — AB (ref >60)
Glucose, Bld: 100 mg/dL — ABNORMAL HIGH (ref 65–99)
Potassium: 3.5 mmol/L (ref 3.5–5.1)
SODIUM: 135 mmol/L (ref 135–145)

## 2016-06-15 LAB — GLUCOSE, CAPILLARY: GLUCOSE-CAPILLARY: 102 mg/dL — AB (ref 65–99)

## 2016-06-15 LAB — CBC
HCT: 28.3 % — ABNORMAL LOW (ref 39.0–52.0)
Hemoglobin: 9.9 g/dL — ABNORMAL LOW (ref 13.0–17.0)
MCH: 30.4 pg (ref 26.0–34.0)
MCHC: 35 g/dL (ref 30.0–36.0)
MCV: 86.8 fL (ref 78.0–100.0)
Platelets: 145 10*3/uL — ABNORMAL LOW (ref 150–400)
RBC: 3.26 MIL/uL — ABNORMAL LOW (ref 4.22–5.81)
RDW: 16.2 % — AB (ref 11.5–15.5)
WBC: 4.9 10*3/uL (ref 4.0–10.5)

## 2016-06-15 LAB — MRSA PCR SCREENING: MRSA BY PCR: NEGATIVE

## 2016-06-15 LAB — URINE CULTURE: CULTURE: NO GROWTH

## 2016-06-15 LAB — MAGNESIUM: MAGNESIUM: 2.2 mg/dL (ref 1.7–2.4)

## 2016-06-15 LAB — ECHOCARDIOGRAM COMPLETE
Height: 64 in
WEIGHTICAEL: 2638.47 [oz_av]

## 2016-06-15 NOTE — Progress Notes (Signed)
PROGRESS NOTE    Brian Montgomery  [id number] DOB: October 20, 1935 DOA: 06/13/2016 PCP: Wyatt Haste, MD    Brief Narrative:  80 year old gentleman history of muscle invasive high-grade papillary urothelial carcinoma diagnosed in July 2017 status post TURBT on 01/21/2016 receiving definitive radiation therapy with weekly carboplatin presented from radiation treatment with generalized weakness. Patient also noted to be septic.  Assessment & Plan:   Principal Problem:   Sepsis ([place]) Active Problems:   Essential hypertension, benign   Hyperlipidemia with target LDL less than 100   Gait instability   Bladder cancer (HCC)   Malignant neoplasm of prostate (HCC)   Dehydration   Weakness generalized   Elevated troponin   ARF (acute renal failure) (HCC)  #1 Pneumonia with sepsis present on admission - Patient initially presented with generalized weakness, presented with fevers of 103F, tachycardia. - CXR reviewed with findings of possible bibasilar infiltrates - Blood cx neg, urine cx neg - Will focus on treating possible PNA  #2 generalized weakness - PT/OT consulted - Pan cultures thus far neg - Per above, focus tx on possible PNA  #3 acute renal failure - Labs reviewed. Cr is improving with IVF and holding ACEI - Repeat bmet in AM  #4 elevated troponin - Suspect troponin leak secondary to presenting sepsis - EKG with no ischemic changes.  #5 dehydration - Continued on basal IV fluids.  #6 hypertension - BP stable and controlled at the moment - ACEI and diuretic remain on hold secondary to ARF and dehydration - Repeat bmet in AM  #7 gait instability - For now, continue Sinemet and Aricept. PT/OT.  #8 muscle invasive high-grade papillary urothelial carcinoma status post TURBT (01/21/2016) - Patient continues with radiation tx with weekly carboplatin that was started on 10/19 - Receiving radiation tx today - Appears stable at the moment  DVT prophylaxis:  Lovenox Code Status: Full Family Communication: Pt in room, family at bedside Disposition Plan: Uncertain at this time  Consultants:   Radiation oncology  Procedures:     Antimicrobials: Anti-infectives    Start     Dose/Rate Route Frequency Ordered Stop   06/14/16 1600  vancomycin (VANCOCIN) IVPB 750 mg/150 ml premix  Status:  Discontinued     750 mg 150 mL/hr over 60 Minutes Intravenous Every 24 hours 06/13/16 1529 06/13/16 1934   06/14/16 1600  vancomycin (VANCOCIN) IVPB 750 mg/150 ml premix     750 mg 150 mL/hr over 60 Minutes Intravenous Every 24 hours 06/13/16 2216     06/13/16 2100  piperacillin-tazobactam (ZOSYN) IVPB 3.375 g     3.375 g 12.5 mL/hr over 240 Minutes Intravenous Every 8 hours 06/13/16 1436     06/13/16 1430  piperacillin-tazobactam (ZOSYN) IVPB 3.375 g     3.375 g 100 mL/hr over 30 Minutes Intravenous  Once 06/13/16 1424 06/13/16 1620   06/13/16 1430  vancomycin (VANCOCIN) IVPB 1000 mg/200 mL premix     1,000 mg 200 mL/hr over 60 Minutes Intravenous  Once 06/13/16 1424 06/13/16 1730       Subjective: No complaints today  Objective: Vitals:   06/14/16 2318 06/15/16 0151 06/15/16 0400 06/15/16 1300  BP: 122/77  121/65 127/63  Pulse: (!) 54  65 80  Resp: 18  18 (!) 21  Temp: 100.2 F (37.9 C) 99.9 F (37.7 C) 99.7 F (37.6 C) 98.4 F (36.9 C)  TempSrc: Oral Oral Oral Oral  SpO2: 100%  100% 100%  Weight:      Height:  Intake/Output Summary (Last 24 hours) at 06/15/16 1535 Last data filed at 06/15/16 1516  Gross per 24 hour  Intake          3661.67 ml  Output             1500 ml  Net          2161.67 ml   Filed Weights   06/13/16 1213 06/13/16 1811 06/14/16 0522  Weight: 76.7 kg (169 lb) 74 kg (163 lb 2.3 oz) 74.8 kg (164 lb 14.5 oz)    Examination:  General exam: Appears calm and comfortable  Respiratory system: Clear to auscultation. Respiratory effort normal. Cardiovascular system: S1 & S2 heard, RRR. Gastrointestinal  system: Abdomen is nondistended, soft and nontender. No organomegaly or masses felt. Normal bowel sounds heard. Central nervous system: Alert and oriented. No focal neurological deficits. Extremities: Symmetric 5 x 5 power. Skin: No rashes, lesions Psychiatry: Judgement and insight appear normal. Mood & affect appropriate.   Data Reviewed: I have personally reviewed following labs and imaging studies  CBC:  Recent Labs Lab 06/09/16 0952 06/13/16 1253 06/14/16 0159 06/15/16 0346  WBC 2.9* 8.1 6.8 4.9  NEUTROABS 2.3 6.9  --   --   HGB 11.7* 12.3* 10.8* 9.9*  HCT 34.0* 36.2* 31.9* 28.3*  MCV 87.4 88.7 88.4 86.8  PLT 130* 174 163 [id number]*   Basic Metabolic Panel:  Recent Labs Lab 06/09/16 0952 06/13/16 1253 06/14/16 0159 06/15/16 0346  NA 140 139 138 135  K 3.7 3.7 3.4* 3.5  CL  --  104 109 109  CO2 23 23 19* 20*  GLUCOSE 93 129* 104* 100*  BUN 30.8* 43* 41* 32*  CREATININE 1.4* 2.17* 2.06* 1.75*  CALCIUM 9.7 9.3 8.3* 8.0*  MG  --   --  1.7 2.2   GFR: Estimated Creatinine Clearance: 32.2 mL/min (by C-G formula based on SCr of 1.75 mg/dL (H)). Liver Function Tests:  Recent Labs Lab 06/09/16 [id number] 06/13/16 1253 06/14/16 0159  AST 13 57* 68*  ALT 6 24 13*  ALKPHOS 76 77 66  BILITOT 0.71 1.2 1.0  PROT 7.7 8.6* 7.0  ALBUMIN 3.2* 3.8 3.1*   No results for input(s): LIPASE, AMYLASE in the last 168 hours.  Recent Labs Lab 06/13/16 1253  AMMONIA 9   Coagulation Profile:  Recent Labs Lab 06/14/16 0159  INR 1.14   Cardiac Enzymes:  Recent Labs Lab 06/13/16 1253 06/13/16 2016 06/14/16 0159 06/14/16 0822  TROPONINI 0.03* 0.03* 0.04* 0.03*   BNP (last 3 results) No results for input(s): PROBNP in the last 8760 hours. HbA1C: No results for input(s): HGBA1C in the last 72 hours. CBG:  Recent Labs Lab 06/13/16 1307 06/14/16 0741 06/15/16 0746  GLUCAP 119* 91 102*   Lipid Profile: No results for input(s): CHOL, HDL, LDLCALC, TRIG, CHOLHDL, LDLDIRECT  in the last 72 hours. Thyroid Function Tests:  Recent Labs  06/13/16 2016  TSH 0.856   Anemia Panel: No results for input(s): VITAMINB12, FOLATE, FERRITIN, TIBC, IRON, RETICCTPCT in the last 72 hours. Sepsis Labs:  Recent Labs Lab 06/13/16 1310 06/13/16 1844 06/13/16 2016 06/13/16 2246 06/14/16 0159  PROCALCITON  --   --   --  3.23  --   LATICACIDVEN 2.82* 1.3 1.2  --  1.1    Recent Results (from the past 240 hour(s))  Urine culture     Status: None   Collection Time: 06/13/16 12:12 PM  Result Value Ref Range Status   Specimen Description URINE, CLEAN  CATCH  Final   Special Requests NONE  Final   Culture NO GROWTH Performed at [facility]   Final   Report Status 06/15/2016 FINAL  Final  Blood culture (routine x 2)     Status: None (Preliminary result)   Collection Time: 06/13/16 12:53 PM  Result Value Ref Range Status   Specimen Description BLOOD LEFT ANTECUBITAL  Final   Special Requests BOTTLES DRAWN AEROBIC AND ANAEROBIC 5CC  Final   Culture   Final    NO GROWTH 1 DAY Performed at [facility]    Report Status PENDING  Incomplete  Blood culture (routine x 2)     Status: None (Preliminary result)   Collection Time: 06/13/16  6:30 PM  Result Value Ref Range Status   Specimen Description   Final    BLOOD RIGHT ANTECUBITAL Performed at [place] Requests   Final    [place] Performed at [place]   Final    NO GROWTH < 24 HOURS Performed at [facility]    Report Status PENDING  Incomplete     Radiology Studies: X-ray Chest Pa And Lateral  Result Date: 06/14/2016 CLINICAL DATA:  Generalized weakness EXAM: CHEST  2 VIEW COMPARISON:  06/13/2016 FINDINGS: Progression of bibasilar airspace disease since the prior study. Negative for heart failure. Negative for pleural effusion. IMPRESSION: Progression of bibasilar atelectasis/infiltrate.  Electronically Signed   By: Franchot Gallo M.D.   On: 06/14/2016 11:51   US Renal  Result Date: 06/14/2016 CLINICAL DATA:  Weakness. Increased creatinine. Acute renal insufficiency. Bladder cancer. EXAM: RENAL / URINARY TRACT ULTRASOUND COMPLETE COMPARISON:  None. FINDINGS: Right Kidney: Length: 11.9 cm, within normal limits. Echogenicity within normal limits. No mass or hydronephrosis visualized. Left Kidney: Length: 11.7 cm, within normal limits. Echogenicity within normal limits. No mass or hydronephrosis visualized. Bladder: Appears normal for degree of bladder distention. IMPRESSION: Negative bilateral renal ultrasound. Electronically Signed   By: San Morelle M.D.   On: 06/14/2016 13:01    Scheduled Meds: . carbidopa-levodopa  1 tablet Oral TID WC  . donepezil  5 mg Oral QHS  . enoxaparin (LOVENOX) injection  30 mg Subcutaneous Q24H  . piperacillin-tazobactam (ZOSYN)  IV  3.375 g Intravenous Q8H  . sodium chloride  1,000 mL Intravenous Once   And  . sodium chloride  1,000 mL Intravenous Once   And  . sodium chloride  250 mL Intravenous Once  . sodium chloride flush  3 mL Intravenous Q12H  . vancomycin  750 mg Intravenous Q24H   Continuous Infusions: . sodium chloride 100 mL/hr at 06/15/16 0713     LOS: 2 days   CHIU, Orpah Melter, MD [facility] Pager [phone]  If 7PM-7AM, please contact night-coverage [email or web address] Password [email or web address] 06/15/2016, 3:35 PM

## 2016-06-15 NOTE — Clinical Social Work Note (Signed)
Clinical Social Work Assessment  Patient Details  Name: Brian Montgomery MRN: [id number] Date of Birth: 06/25/1936  Date of referral:  06/15/16               Reason for consult:  Facility Placement                Permission sought to share information with:  Chartered certified accountant granted to share information::  Yes, Verbal Permission Granted  Name::        Agency::     Relationship::     Contact Information:     Housing/Transportation Living arrangements for the past 2 months:  Single Family Home Source of Information:  Spouse, Adult Children Patient Interpreter Needed:  None Criminal Activity/Legal Involvement Pertinent to Current Situation/Hospitalization:  No - Comment as needed Significant Relationships:  Adult Children, Spouse Lives with:  Spouse Do you feel safe going back to the place where you live?  No Need for family participation in patient care:  Yes (Comment)  Care giving concerns:  CSW received consult for SNF placement.    Social Worker assessment / plan:  CSW spoke with patient's wife & daughters [name] & Tammy at bedside re: discharge planning, they are all in agreement with plan for SNF.   Employment status:  Retired Forensic scientist:  Medicare PT Recommendations:  [place] / Referral to community resources:  [place]  Patient/Family's Response to care:  Patient's family states that [facility] would be their first choice, [facility] second choice. CSW confirmed with Narda Rutherford at [facility] that they would have a bed available for patient at discharge. Family plans to tour around 2pm today and possibly sign admission paperwork.   Patient/Family's Understanding of and Emotional Response to Diagnosis, Current Treatment, and Prognosis:  Patient's daughters are concerned with patient getting discharged from the hospital too soon, concerned about chest xray showing pneumonia.   Emotional  Assessment Appearance:  Appears stated age Attitude/Demeanor/Rapport:    Affect (typically observed):    Orientation:  Oriented to Self, Oriented to Place, Oriented to  Time, Oriented to Situation Alcohol / Substance use:    Psych involvement (Current and /or in the community):     Discharge Needs  Concerns to be addressed:    Readmission within the last 30 days:    Current discharge risk:    Barriers to Discharge:      Standley Brooking, LCSW 06/15/2016, 10:32 AM

## 2016-06-15 NOTE — Clinical Social Work Placement (Signed)
Patient has a bed at [facility]. CSW has completed FL2 & will continue to follow and assist with discharge when ready.    Raynaldo Opitz, [name] Clinical Social Worker cell #: [phone]     CLINICAL SOCIAL WORK PLACEMENT  NOTE  Date:  06/15/2016  Patient Details  Name: Brian Montgomery MRN: [id number] Date of Birth: 12-26-1935  Clinical Social Work is seeking post-discharge placement for this patient at the [place] level of care (*CSW will initial, date and re-position this form in  chart as items are completed):  Yes   Patient/family provided with [place] Work Department's list of facilities offering this level of care within the geographic area requested by the patient (or if unable, by the patient's family).  Yes   Patient/family informed of their freedom to choose among providers that offer the needed level of care, that participate in Medicare, Medicaid or managed care program needed by the patient, have an available bed and are willing to accept the patient.  Yes   Patient/family informed of [facility]'s ownership interest in [facility] and [facility], as well as of the fact that they are under no obligation to receive care at these facilities.  PASRR submitted to EDS on 06/15/16     PASRR number received on 06/15/16     Existing PASRR number confirmed on       FL2 transmitted to all facilities in geographic area requested by pt/family on 06/15/16     FL2 transmitted to all facilities within larger geographic area on       Patient informed that his/her managed care company has contracts with or will negotiate with certain facilities, including the following:        Yes   Patient/family informed of bed offers received.  Patient chooses bed at [facility]     Physician recommends and patient chooses bed at      Patient to be transferred to [facility] on   .  Patient to be transferred to facility by       Patient family notified on   of transfer.  Name of family member notified:        PHYSICIAN       Additional Comment:    _______________________________________________ Standley Brooking, LCSW 06/15/2016, 10:35 AM

## 2016-06-15 NOTE — Progress Notes (Signed)
OT Cancellation Note  Patient Details Name: Brian Montgomery MRN: [id number] DOB: [birth year]   Cancelled Treatment:     Note plan for for SNF this day- will defer OT eval to SNF   [place], Thereasa Parkin 06/15/2016, 10:58 AM

## 2016-06-15 NOTE — Progress Notes (Signed)
  Echocardiogram 2D Echocardiogram has been performed.  Darlina Sicilian M 06/15/2016, 2:57 PM

## 2016-06-15 NOTE — NC FL2 (Signed)
[place] LEVEL OF CARE SCREENING TOOL     IDENTIFICATION  Patient Name: Brian Montgomery Birthdate: May 26, 1936 Sex: male Admission Date (Current Location): 06/13/2016  [facility] and [place] Number:  Herbalist and Address:  [facility],  [address] [place], [place]      Provider Number: [id number]  Attending Physician Name and Address:  Donne Hazel, MD  Relative Name and Phone Number:       Current Level of Care: Hospital Recommended Level of Care: [place] Prior Approval Number:    Date Approved/Denied:   PASRR Number: [id number]  Discharge Plan: SNF    Current Diagnoses: Patient Active Problem List   Diagnosis Date Noted  . Dehydration 06/13/2016  . Weakness generalized 06/13/2016  . Elevated troponin 06/13/2016  . ARF (acute renal failure) ([place]) 06/13/2016  . Sepsis ([place]) 06/13/2016  . Malignant neoplasm of prostate ([place]) 03/18/2016  . Bladder cancer ([place]) 01/21/2016  . Appendicular ataxia 09/24/2015  . Pruritus 09/24/2015  . Hereditary and idiopathic peripheral neuropathy 01/14/2015  . Gait instability 10/13/2014  . Hyperlipidemia with target LDL less than 100 08/06/2013  . Essential hypertension, benign 09/17/2012    Orientation RESPIRATION BLADDER Height & Weight     Self, Time, Situation, Place  Normal Continent Weight: 164 lb 14.5 oz (74.8 kg) Height:  5\' 4"  (162.6 cm)  BEHAVIORAL SYMPTOMS/MOOD NEUROLOGICAL BOWEL NUTRITION STATUS      Continent Diet (Regular)  AMBULATORY STATUS COMMUNICATION OF NEEDS Skin   Extensive Assist Verbally Normal                       Personal Care Assistance Level of Assistance  Bathing, Dressing Bathing Assistance: Limited assistance   Dressing Assistance: Limited assistance     Functional Limitations Info             SPECIAL CARE FACTORS FREQUENCY  PT (By licensed PT), OT (By licensed OT)     PT Frequency: 5 OT Frequency: 5             Contractures      Additional Factors Info  Code Status, Allergies Code Status Info: Fullcode Allergies Info: NKDA           Current Medications (06/15/2016):  This is the current hospital active medication list Current Facility-Administered Medications  Medication Dose Route Frequency Provider Last Rate Last Dose  . 0.9 %  sodium chloride infusion   Intravenous Continuous Eugenie Filler, MD 100 mL/hr at 06/15/16 [phone]    . acetaminophen (TYLENOL) tablet 650 mg  650 mg Oral Q6H PRN Eugenie Filler, MD       Or  . acetaminophen (TYLENOL) suppository 650 mg  650 mg Rectal Q6H PRN Eugenie Filler, MD      . carbidopa-levodopa (SINEMET IR) 25-100 MG per tablet immediate release 1 tablet  1 tablet Oral TID WC Eugenie Filler, MD   1 tablet at 06/15/16 0746  . donepezil (ARICEPT) tablet 5 mg  5 mg Oral QHS Eugenie Filler, MD   5 mg at 06/14/16 2117  . enoxaparin (LOVENOX) injection 30 mg  30 mg Subcutaneous Q24H Eugenie Filler, MD   30 mg at 06/14/16 2117  . HYDROcodone-acetaminophen (NORCO/VICODIN) 5-325 MG per tablet 1-2 tablet  1-2 tablet Oral Q4H PRN Eugenie Filler, MD      . levalbuterol [facility]) nebulizer solution 0.63 mg  0.63 mg Nebulization Q2H PRN Eugenie Filler,  MD      . ondansetron (ZOFRAN) tablet 4 mg  4 mg Oral Q6H PRN Eugenie Filler, MD       Or  . ondansetron [facility]) injection 4 mg  4 mg Intravenous Q6H PRN Eugenie Filler, MD      . phenazopyridine (PYRIDIUM) tablet 100 mg  100 mg Oral TID PRN Eugenie Filler, MD      . piperacillin-tazobactam (ZOSYN) IVPB 3.375 g  3.375 g Intravenous Q8H Luiz Ochoa, RPH   3.375 g at 06/15/16 0502  . senna-docusate (Senokot-S) tablet 1 tablet  1 tablet Oral QHS PRN Eugenie Filler, MD      . sodium chloride 0.9 % bolus 1,000 mL  1,000 mL Intravenous Once Eugenie Filler, MD       And  . sodium chloride 0.9 % bolus 1,000 mL  1,000 mL Intravenous Once Eugenie Filler, MD       And  . sodium  chloride 0.9 % bolus 250 mL  250 mL Intravenous Once Eugenie Filler, MD      . sodium chloride flush (NS) 0.9 % injection 3 mL  3 mL Intravenous Q12H Eugenie Filler, MD      . sorbitol 70 % solution 30 mL  30 mL Oral Daily PRN Eugenie Filler, MD      . vancomycin (VANCOCIN) IVPB 750 mg/150 ml premix  750 mg Intravenous Q24H Luiz Ochoa, RPH   750 mg at 06/14/16 1558     Discharge Medications: Please see discharge summary for a list of discharge medications.  Relevant Imaging Results:  Relevant Lab Results:   Additional Information SSN: [id number]  Standley Brooking, LCSW

## 2016-06-16 ENCOUNTER — Ambulatory Visit
Admission: RE | Admit: 2016-06-16 | Discharge: 2016-06-16 | Disposition: A | Discharge status: Home / Self Care | Payer: Medicare Other | Source: Ambulatory Visit | Attending: Radiation Oncology | Admitting: Radiation Oncology

## 2016-06-16 ENCOUNTER — Ambulatory Visit
Admit: 2016-06-16 | Discharge: 2016-06-16 | Disposition: A | Discharge status: Home / Self Care | Payer: Medicare Other | Attending: Radiation Oncology | Admitting: Radiation Oncology

## 2016-06-16 ENCOUNTER — Other Ambulatory Visit: Payer: Medicare Other

## 2016-06-16 ENCOUNTER — Ambulatory Visit: Payer: Medicare Other | Admitting: Oncology

## 2016-06-16 VITALS — BP 132/71 | HR 78 | Temp 99.1°F | Resp 18

## 2016-06-16 DIAGNOSIS — E860 Dehydration: Secondary | ICD-10-CM

## 2016-06-16 DIAGNOSIS — C672 Malignant neoplasm of lateral wall of bladder: Secondary | ICD-10-CM | POA: Diagnosis not present

## 2016-06-16 DIAGNOSIS — C679 Malignant neoplasm of bladder, unspecified: Secondary | ICD-10-CM

## 2016-06-16 DIAGNOSIS — C61 Malignant neoplasm of prostate: Secondary | ICD-10-CM

## 2016-06-16 LAB — GLUCOSE, CAPILLARY: GLUCOSE-CAPILLARY: 86 mg/dL (ref 65–99)

## 2016-06-16 LAB — BASIC METABOLIC PANEL
Anion gap: 6 (ref 5–15)
BUN: 26 mg/dL — ABNORMAL HIGH (ref 6–20)
CALCIUM: 8.1 mg/dL — AB (ref 8.9–10.3)
CO2: 22 mmol/L (ref 22–32)
CREATININE: 1.62 mg/dL — AB (ref 0.61–1.24)
Chloride: 110 mmol/L (ref 101–111)
GFR calc Af Amer: 45 mL/min — ABNORMAL LOW (ref >60)
GFR calc non Af Amer: 39 mL/min — ABNORMAL LOW (ref >60)
GLUCOSE: 96 mg/dL (ref 65–99)
Potassium: 3.4 mmol/L — ABNORMAL LOW (ref 3.5–5.1)
Sodium: 138 mmol/L (ref 135–145)

## 2016-06-16 MED ORDER — POTASSIUM CHLORIDE CRYS ER 20 MEQ PO TBCR
40.0000 meq | EXTENDED_RELEASE_TABLET | Freq: Once | ORAL | Status: AC
  Administered 2016-06-16: 40 meq via ORAL
  Filled 2016-06-16: qty 2

## 2016-06-16 MED ORDER — AMOXICILLIN-POT CLAVULANATE 875-125 MG PO TABS
1.0000 | ORAL_TABLET | Freq: Two times a day (BID) | ORAL | Status: DC
  Administered 2016-06-16 – 2016-06-17 (×3): 1 via ORAL
  Filled 2016-06-16 (×3): qty 1

## 2016-06-16 MED ORDER — BENZONATATE 100 MG PO CAPS: 100.0000 mg | ORAL_CAPSULE | Freq: Three times a day (TID) | ORAL | Status: DC | PRN

## 2016-06-16 MED ORDER — SACCHAROMYCES BOULARDII 250 MG PO CAPS
250.0000 mg | ORAL_CAPSULE | Freq: Two times a day (BID) | ORAL | Status: DC
  Administered 2016-06-16 – 2016-06-17 (×3): 250 mg via ORAL
  Filled 2016-06-16 (×3): qty 1

## 2016-06-16 NOTE — Progress Notes (Signed)
Events noted in the last few days. Patient well-known to me with bladder cancer. He is scheduled to have a follow-up at the [facility] today which will be canceled. We will arrange follow-up for him in the next 3-4 weeks after recovery from this current episode. No further chemotherapy will be scheduled and he is to complete radiation therapy in the immediate futu

## 2016-06-16 NOTE — Progress Notes (Signed)
Patient hospitalized. Resting supine in hospital bed thus, no weight obtained. Daughters at bedside. Vitals stable. Denies pain. Reports he was seen by [facility] yesterday and final chemotherapy cancelled. Daughter understand patient will possibly be transferred to [facility] over the weekend for strengthening. Daughters questions if final two radiation treatments could be cancelled such that private transportation doesn't have to be arranged. Foley catheter noted with clear yellow urine. No hemoptysis noted.  BP 132/71   Pulse 78   Temp 99.1 F (37.3 C) (Oral)   Resp 18   SpO2 100%  Wt Readings from Last 3 Encounters:  06/16/16 175 lb 14.8 oz (79.8 kg)  06/10/16 169 lb 9.6 oz (76.9 kg)  06/01/16 171 lb 3.2 oz (77.7 kg)

## 2016-06-16 NOTE — Progress Notes (Addendum)
PROGRESS NOTE    Brian Montgomery  [id number] DOB: 10-15-1935 DOA: 06/13/2016 PCP: Wyatt Haste, MD    Brief Narrative:  80 year old gentleman history of muscle invasive high-grade papillary urothelial carcinoma diagnosed in July 2017 status post TURBT on 01/21/2016 receiving definitive radiation therapy with weekly carboplatin presented from radiation treatment with generalized weakness. Patient also noted to be septic.  Assessment & Plan:   Principal Problem:   Sepsis ([place]) Active Problems:   Essential hypertension, benign   Hyperlipidemia with target LDL less than 100   Gait instability   Bladder cancer (HCC)   Malignant neoplasm of prostate (HCC)   Dehydration   Weakness generalized   Elevated troponin   ARF (acute renal failure) (HCC)  #1 Pneumonia with sepsis present on admission - Patient initially presented with generalized weakness, presented with fevers of 103F, tachycardia. - CXR reviewed with findings of possible bibasilar infiltrates - Blood cx neg, urine cx remain neg - Transition to augmentin today  #2 generalized weakness - PT/OT consulted - Plans for SNF at discharge  #3 acute renal failure - Cr continues to improve with hydration - Will recheck bmet in AM  #4 elevated troponin - most recent trop down to 0.03 on 12/5 - Suspect trop leak in the setting of sepsis  #5 dehydration - Improved with IVF hydration - decrease ivf to 75cc/hr  #6 hypertension - bp remain stable this AM - ACEI and diuretic remain on hold secondary to ARF and dehydration - recheck bmet in AM  #7 gait instability - For now, continue Sinemet and Aricept.  - PT recommends SNF at discharge  #8 muscle invasive high-grade papillary urothelial carcinoma status post TURBT (01/21/2016) - Patient continues with radiation tx with weekly carboplatin that was started on 10/19 - Receiving radiation tx  - Remains stable  DVT prophylaxis: Lovenox Code Status:  Full Family Communication: Pt in room, family at bedside Disposition Plan: Uncertain at this time  Consultants:   Radiation oncology  Procedures:     Antimicrobials: Anti-infectives    Start     Dose/Rate Route Frequency Ordered Stop   06/16/16 1200  amoxicillin-clavulanate (AUGMENTIN) 875-125 MG per tablet 1 tablet     1 tablet Oral Every 12 hours 06/16/16 0933     06/14/16 1600  vancomycin (VANCOCIN) IVPB 750 mg/150 ml premix  Status:  Discontinued     750 mg 150 mL/hr over 60 Minutes Intravenous Every 24 hours 06/13/16 1529 06/13/16 1934   06/14/16 1600  vancomycin (VANCOCIN) IVPB 750 mg/150 ml premix  Status:  Discontinued     750 mg 150 mL/hr over 60 Minutes Intravenous Every 24 hours 06/13/16 2216 06/16/16 0932   06/13/16 2100  piperacillin-tazobactam (ZOSYN) IVPB 3.375 g  Status:  Discontinued     3.375 g 12.5 mL/hr over 240 Minutes Intravenous Every 8 hours 06/13/16 1436 06/16/16 0932   06/13/16 1430  piperacillin-tazobactam (ZOSYN) IVPB 3.375 g     3.375 g 100 mL/hr over 30 Minutes Intravenous  Once 06/13/16 1424 06/13/16 1620   06/13/16 1430  vancomycin (VANCOCIN) IVPB 1000 mg/200 mL premix     1,000 mg 200 mL/hr over 60 Minutes Intravenous  Once 06/13/16 1424 06/13/16 1730      Subjective: Patient without complaints  Objective: Vitals:   06/15/16 0151 06/15/16 0400 06/15/16 1300 06/16/16 0656  BP:  121/65 127/63 138/74  Pulse:  65 80 81  Resp:  18 (!) 21 20  Temp: 99.9 F (37.7 C) 99.7 F (37.6 C) 98.4  F (36.9 C) 98.1 F (36.7 C)  TempSrc: Oral Oral Oral Oral  SpO2:  100% 100% 98%  Weight:    79.8 kg (175 lb 14.8 oz)  Height:        Intake/Output Summary (Last 24 hours) at 06/16/16 1518 Last data filed at 06/16/16 0933  Gross per 24 hour  Intake             1680 ml  Output             2225 ml  Net             -545 ml   Filed Weights   06/13/16 1811 06/14/16 0522 06/16/16 0656  Weight: 74 kg (163 lb 2.3 oz) 74.8 kg (164 lb 14.5 oz) 79.8 kg  (175 lb 14.8 oz)    Examination:  General exam: Laying in bed, in nad  Respiratory system: Normal resp effort, no wheezing Cardiovascular system: Regular rate, s1, s2 Gastrointestinal system: Pos BS, nondistended Central nervous system: CN2-12 grossly intact, strength intact Extremities: perfused, no clubbing Skin: normal skin turgor, no notable skin lesions seen Psychiatry: mood normal// no visual hallucinations   Data Reviewed: I have personally reviewed following labs and imaging studies  CBC:  Recent Labs Lab 06/13/16 1253 06/14/16 0159 06/15/16 0346  WBC 8.1 6.8 4.9  NEUTROABS 6.9  --   --   HGB 12.3* 10.8* 9.9*  HCT 36.2* 31.9* 28.3*  MCV 88.7 88.4 86.8  PLT 174 163 [id number]*   Basic Metabolic Panel:  Recent Labs Lab 06/13/16 1253 06/14/16 0159 06/15/16 0346 06/16/16 0407  NA 139 138 135 138  K 3.7 3.4* 3.5 3.4*  CL 104 109 109 110  CO2 23 19* 20* 22  GLUCOSE 129* 104* 100* 96  BUN 43* 41* 32* 26*  CREATININE 2.17* 2.06* 1.75* 1.62*  CALCIUM 9.3 8.3* 8.0* 8.1*  MG  --  1.7 2.2  --    GFR: Estimated Creatinine Clearance: 35.8 mL/min (by C-G formula based on SCr of 1.62 mg/dL (H)). Liver Function Tests:  Recent Labs Lab 06/13/16 1253 06/14/16 0159  AST 57* 68*  ALT 24 13*  ALKPHOS 77 66  BILITOT 1.2 1.0  PROT 8.6* 7.0  ALBUMIN 3.8 3.1*   No results for input(s): LIPASE, AMYLASE in the last 168 hours.  Recent Labs Lab 06/13/16 1253  AMMONIA 9   Coagulation Profile:  Recent Labs Lab 06/14/16 0159  INR 1.14   Cardiac Enzymes:  Recent Labs Lab 06/13/16 1253 06/13/16 2016 06/14/16 0159 06/14/16 0822  TROPONINI 0.03* 0.03* 0.04* 0.03*   BNP (last 3 results) No results for input(s): PROBNP in the last 8760 hours. HbA1C: No results for input(s): HGBA1C in the last 72 hours. CBG:  Recent Labs Lab 06/13/16 1307 06/14/16 0741 06/15/16 0746 06/16/16 0744  GLUCAP 119* 91 102* 86   Lipid Profile: No results for input(s): CHOL,  HDL, LDLCALC, TRIG, CHOLHDL, LDLDIRECT in the last 72 hours. Thyroid Function Tests:  Recent Labs  06/13/16 2016  TSH 0.856   Anemia Panel: No results for input(s): VITAMINB12, FOLATE, FERRITIN, TIBC, IRON, RETICCTPCT in the last 72 hours. Sepsis Labs:  Recent Labs Lab 06/13/16 1310 06/13/16 1844 06/13/16 2016 06/13/16 2246 06/14/16 0159  PROCALCITON  --   --   --  3.23  --   LATICACIDVEN 2.82* 1.3 1.2  --  1.1    Recent Results (from the past 240 hour(s))  Urine culture     Status: None   Collection  Time: 06/13/16 12:12 PM  Result Value Ref Range Status   Specimen Description URINE, CLEAN CATCH  Final   Special Requests NONE  Final   Culture NO GROWTH Performed at [facility]   Final   Report Status 06/15/2016 FINAL  Final  Blood culture (routine x 2)     Status: None (Preliminary result)   Collection Time: 06/13/16 12:53 PM  Result Value Ref Range Status   Specimen Description BLOOD LEFT ANTECUBITAL  Final   Special Requests BOTTLES DRAWN AEROBIC AND ANAEROBIC 5CC  Final   Culture   Final    NO GROWTH 3 DAYS Performed at [facility]    Report Status PENDING  Incomplete  Blood culture (routine x 2)     Status: None (Preliminary result)   Collection Time: 06/13/16  6:30 PM  Result Value Ref Range Status   Specimen Description   Final    BLOOD RIGHT ANTECUBITAL Performed at [place] Requests   Final    [place] Performed at [place]   Final    NO GROWTH 3 DAYS Performed at [facility]    Report Status PENDING  Incomplete  MRSA PCR Screening     Status: None   Collection Time: 06/15/16 10:32 AM  Result Value Ref Range Status   MRSA by PCR NEGATIVE NEGATIVE Final    Comment:        The GeneXpert MRSA Assay (FDA approved for NASAL specimens only), is one component of a comprehensive MRSA colonization surveillance program. It is  not intended to diagnose MRSA infection nor to guide or monitor treatment for MRSA infections.      Radiology Studies: No results found.  Scheduled Meds: . amoxicillin-clavulanate  1 tablet Oral Q12H  . carbidopa-levodopa  1 tablet Oral TID WC  . donepezil  5 mg Oral QHS  . enoxaparin (LOVENOX) injection  30 mg Subcutaneous Q24H  . saccharomyces boulardii  250 mg Oral BID  . sodium chloride  1,000 mL Intravenous Once   And  . sodium chloride  1,000 mL Intravenous Once   And  . sodium chloride  250 mL Intravenous Once  . sodium chloride flush  3 mL Intravenous Q12H   Continuous Infusions: . sodium chloride 100 mL/hr at 06/16/16 0529     LOS: 3 days   Sona Nations, Orpah Melter, MD [facility] Pager [phone]  If 7PM-7AM, please contact night-coverage [email or web address] Password [email or web address] 06/16/2016, 3:18 PM

## 2016-06-16 NOTE — Progress Notes (Signed)
Physical Therapy Treatment Patient Details Name: Brian Montgomery MRN: [id number] DOB: 06/02/1936 Today's Date: 06/16/2016    History of Present Illness Brian Montgomery is a 80 year old gentleman history of muscle invasive high-grade papillary urothelial carcinoma diagnosed in July 2017 status post TURBT on 01/21/2016 receiving definitive radiation therapy with weekly carboplatin presented from radiation treatment with generalized weakness. Patient also noted to be septic.    PT Comments    Assisted pt OOB to amb required + 2 assist for safety.  Avg RA was 99%. Very ataxic, unsteady gait with very short shuffled steps.  Difficulty adjusting balance and 2/4 dyspnea.  #rd assist family member following with recliner. Pt would benefit from ST Rehab at SNF  Follow Up Recommendations  SNF ([facility])     Equipment Recommendations       Recommendations for Other Services       Precautions / Restrictions Precautions Precautions: Fall Precaution Comments: monitor O2 Restrictions Weight Bearing Restrictions: No    Mobility  Bed Mobility Overal bed mobility: Needs Assistance Bed Mobility: Supine to Sit     Supine to sit: Mod assist;Max assist     General bed mobility comments: assist to elevate trunk, incr time.  Difficulty performing self righting to midline.    Transfers Overall transfer level: Needs assistance Equipment used: Rolling walker (2 wheeled) Transfers: Sit to/from Stand Sit to Stand: Mod assist;Max assist;+2 safety/equipment         General transfer comment: multi-modal cues for hand placement, anterior wt shift + 2 assist for safety.  Noted decreased grip with L hand.  Ambulation/Gait Ambulation/Gait assistance: Mod assist;+2 safety/equipment Ambulation Distance (Feet): 48 Feet Assistive device: Rolling walker (2 wheeled) Gait Pattern/deviations: Step-to pattern;Step-through pattern;Decreased step length - right;Decreased step length -  left;Shuffle;Festinating;Ataxic Gait velocity: decreased   General Gait Details: ataxic, unsteady gait with 75% VC's for proper walker to self distance and upright posture.  Avg RA 99% and HR 92   Stairs            Wheelchair Mobility    Modified Rankin (Stroke Patients Only)       Balance                                    Cognition Arousal/Alertness: Awake/alert (slightly groggy/slow)                          Exercises      General Comments        Pertinent Vitals/Pain Pain Assessment: No/denies pain    Home Living                      Prior Function            PT Goals (current goals can now be found in the care plan section)      Frequency    Min 3X/week      PT Plan Current plan remains appropriate    Co-evaluation             End of Session Equipment Utilized During Treatment: Gait belt Activity Tolerance: Patient tolerated treatment well Patient left: in chair;with call bell/phone within reach (notified NT to place chair alarm in recliner)     Time: [id number] PT Time Calculation (min) (ACUTE ONLY): 27 min  Charges:  $Gait Training: 8-22 mins $Therapeutic Activity: 8-22 mins  G Codes:      Rica Koyanagi  PTA [facility]  Acute  Rehab Pager      [phone]

## 2016-06-16 NOTE — Clinical Social Work Note (Signed)
Patient has bed at [facility] once medically stable for discharge.   Spouse and dtr, Tammy to complete paperwork at facility on Friday, 12/8 at 1PM.   MSW remains available as needed.   Glendon Axe, MSW [phone] 06/16/2016 1:06 PM

## 2016-06-16 NOTE — Progress Notes (Signed)
  Radiation Oncology         [phone]   INPATIENT  Name: Brian Montgomery MRN: [id number]   Date: 06/16/2016  DOB: May 07, 1936     Weekly Radiation Therapy Management    ICD-9-CM ICD-10-CM   1. Malignant neoplasm of prostate ([place]) 185 C61   2. Malignant neoplasm of urinary bladder, unspecified site (HCC) 188.9 C67.9     Current Dose: 69 Gy  Planned Dose:  71 Gy  Narrative The patient presents for routine under treatment assessment.  Patient hospitalized with weakness,sepsis, low potassium, and pneumonia. He is in physical therapy. He may be discharged tomorrow or Saturday. Resting supine in hospital bed thus, no weight obtained. Daughters at bedside. Vitals stable. Denies pain. Reports he was seen by Dr. Alen Blew yesterday and final chemotherapy cancelled. Daughter understands patient will possibly be transferred to [facility] over the weekend for strengthening. Daughters question if final two radiation treatments could be cancelled such that private transportation doesn't have to be arranged. Foley catheter noted with clear yellow urine. No hemoptysis noted. He is no longer able to walk. He was walking with the aid of a walker two weeks ago.  Set-up films were reviewed. The chart was checked.  Physical Findings  oral temperature is 99.1 F (37.3 C). His blood pressure is 132/71 and his pulse is 78. His respiration is 18 and oxygen saturation is 100%. . Weight essentially stable.  No significant changes. Presents in hospital bed.  Impression The patient is tolerating radiation.   Plan His treatment plan will be adjusted so that he completes radiation tomorrow. His total dose has been revised to reflect this change.       Sheral Apley Tammi Klippel, M.D.  This document serves as a record of services personally performed by Tyler Pita, MD. It was created on his behalf by Arlyce Harman, a trained medical scribe. The creation of this record is based on the scribe's personal observations and  the provider's statements to them. This document has been checked and approved by the attending provider.

## 2016-06-17 ENCOUNTER — Encounter: Payer: Self-pay | Admitting: Radiation Oncology

## 2016-06-17 ENCOUNTER — Inpatient Hospital Stay (HOSPITAL_COMMUNITY): Payer: Medicare Other

## 2016-06-17 ENCOUNTER — Ambulatory Visit
Admission: RE | Admit: 2016-06-17 | Discharge: 2016-06-17 | Disposition: A | Discharge status: Home / Self Care | Payer: Medicare Other | Source: Ambulatory Visit | Attending: Radiation Oncology | Admitting: Radiation Oncology

## 2016-06-17 ENCOUNTER — Encounter: Payer: Self-pay | Admitting: Oncology

## 2016-06-17 DIAGNOSIS — M25552 Pain in left hip: Secondary | ICD-10-CM | POA: Diagnosis not present

## 2016-06-17 DIAGNOSIS — C679 Malignant neoplasm of bladder, unspecified: Secondary | ICD-10-CM | POA: Diagnosis present

## 2016-06-17 DIAGNOSIS — A419 Sepsis, unspecified organism: Secondary | ICD-10-CM | POA: Diagnosis not present

## 2016-06-17 DIAGNOSIS — R2681 Unsteadiness on feet: Secondary | ICD-10-CM

## 2016-06-17 DIAGNOSIS — E46 Unspecified protein-calorie malnutrition: Secondary | ICD-10-CM | POA: Diagnosis not present

## 2016-06-17 DIAGNOSIS — S72002A Fracture of unspecified part of neck of left femur, initial encounter for closed fracture: Secondary | ICD-10-CM | POA: Diagnosis not present

## 2016-06-17 DIAGNOSIS — N183 Chronic kidney disease, stage 3 (moderate): Secondary | ICD-10-CM | POA: Diagnosis present

## 2016-06-17 DIAGNOSIS — S0990XA Unspecified injury of head, initial encounter: Secondary | ICD-10-CM | POA: Diagnosis not present

## 2016-06-17 DIAGNOSIS — E785 Hyperlipidemia, unspecified: Secondary | ICD-10-CM | POA: Diagnosis present

## 2016-06-17 DIAGNOSIS — R259 Unspecified abnormal involuntary movements: Secondary | ICD-10-CM | POA: Diagnosis not present

## 2016-06-17 DIAGNOSIS — R079 Chest pain, unspecified: Secondary | ICD-10-CM | POA: Diagnosis not present

## 2016-06-17 DIAGNOSIS — S72142A Displaced intertrochanteric fracture of left femur, initial encounter for closed fracture: Secondary | ICD-10-CM | POA: Diagnosis present

## 2016-06-17 DIAGNOSIS — N390 Urinary tract infection, site not specified: Secondary | ICD-10-CM | POA: Diagnosis not present

## 2016-06-17 DIAGNOSIS — Z510 Encounter for antineoplastic radiation therapy: Secondary | ICD-10-CM | POA: Diagnosis not present

## 2016-06-17 DIAGNOSIS — S72009A Fracture of unspecified part of neck of unspecified femur, initial encounter for closed fracture: Secondary | ICD-10-CM | POA: Diagnosis not present

## 2016-06-17 DIAGNOSIS — E860 Dehydration: Secondary | ICD-10-CM | POA: Diagnosis not present

## 2016-06-17 DIAGNOSIS — W19XXXA Unspecified fall, initial encounter: Secondary | ICD-10-CM | POA: Diagnosis not present

## 2016-06-17 DIAGNOSIS — R6520 Severe sepsis without septic shock: Secondary | ICD-10-CM | POA: Diagnosis not present

## 2016-06-17 DIAGNOSIS — R2689 Other abnormalities of gait and mobility: Secondary | ICD-10-CM | POA: Diagnosis not present

## 2016-06-17 DIAGNOSIS — I10 Essential (primary) hypertension: Secondary | ICD-10-CM | POA: Diagnosis not present

## 2016-06-17 DIAGNOSIS — C672 Malignant neoplasm of lateral wall of bladder: Secondary | ICD-10-CM | POA: Diagnosis not present

## 2016-06-17 DIAGNOSIS — W010XXA Fall on same level from slipping, tripping and stumbling without subsequent striking against object, initial encounter: Secondary | ICD-10-CM | POA: Diagnosis present

## 2016-06-17 DIAGNOSIS — R260 Ataxic gait: Secondary | ICD-10-CM | POA: Diagnosis not present

## 2016-06-17 DIAGNOSIS — S299XXA Unspecified injury of thorax, initial encounter: Secondary | ICD-10-CM | POA: Diagnosis not present

## 2016-06-17 DIAGNOSIS — M6281 Muscle weakness (generalized): Secondary | ICD-10-CM | POA: Diagnosis not present

## 2016-06-17 DIAGNOSIS — E039 Hypothyroidism, unspecified: Secondary | ICD-10-CM | POA: Diagnosis not present

## 2016-06-17 DIAGNOSIS — G609 Hereditary and idiopathic neuropathy, unspecified: Secondary | ICD-10-CM | POA: Diagnosis not present

## 2016-06-17 DIAGNOSIS — Z79899 Other long term (current) drug therapy: Secondary | ICD-10-CM | POA: Diagnosis not present

## 2016-06-17 DIAGNOSIS — S8992XA Unspecified injury of left lower leg, initial encounter: Secondary | ICD-10-CM | POA: Diagnosis not present

## 2016-06-17 DIAGNOSIS — F0390 Unspecified dementia without behavioral disturbance: Secondary | ICD-10-CM | POA: Diagnosis present

## 2016-06-17 DIAGNOSIS — Y92129 Unspecified place in nursing home as the place of occurrence of the external cause: Secondary | ICD-10-CM | POA: Diagnosis not present

## 2016-06-17 DIAGNOSIS — C61 Malignant neoplasm of prostate: Secondary | ICD-10-CM | POA: Diagnosis not present

## 2016-06-17 DIAGNOSIS — J189 Pneumonia, unspecified organism: Secondary | ICD-10-CM | POA: Diagnosis not present

## 2016-06-17 DIAGNOSIS — E559 Vitamin D deficiency, unspecified: Secondary | ICD-10-CM | POA: Diagnosis not present

## 2016-06-17 DIAGNOSIS — M79652 Pain in left thigh: Secondary | ICD-10-CM | POA: Diagnosis not present

## 2016-06-17 DIAGNOSIS — I129 Hypertensive chronic kidney disease with stage 1 through stage 4 chronic kidney disease, or unspecified chronic kidney disease: Secondary | ICD-10-CM | POA: Diagnosis present

## 2016-06-17 DIAGNOSIS — R748 Abnormal levels of other serum enzymes: Secondary | ICD-10-CM | POA: Diagnosis not present

## 2016-06-17 DIAGNOSIS — R41841 Cognitive communication deficit: Secondary | ICD-10-CM | POA: Diagnosis not present

## 2016-06-17 DIAGNOSIS — N179 Acute kidney failure, unspecified: Secondary | ICD-10-CM | POA: Diagnosis not present

## 2016-06-17 DIAGNOSIS — M25562 Pain in left knee: Secondary | ICD-10-CM | POA: Diagnosis not present

## 2016-06-17 DIAGNOSIS — N189 Chronic kidney disease, unspecified: Secondary | ICD-10-CM | POA: Diagnosis not present

## 2016-06-17 DIAGNOSIS — Z87891 Personal history of nicotine dependence: Secondary | ICD-10-CM | POA: Diagnosis not present

## 2016-06-17 DIAGNOSIS — D649 Anemia, unspecified: Secondary | ICD-10-CM | POA: Diagnosis not present

## 2016-06-17 DIAGNOSIS — R05 Cough: Secondary | ICD-10-CM | POA: Diagnosis not present

## 2016-06-17 DIAGNOSIS — R000 Tachycardia, unspecified: Secondary | ICD-10-CM | POA: Diagnosis not present

## 2016-06-17 DIAGNOSIS — G20 Parkinson's disease: Secondary | ICD-10-CM | POA: Diagnosis present

## 2016-06-17 DIAGNOSIS — R634 Abnormal weight loss: Secondary | ICD-10-CM | POA: Diagnosis not present

## 2016-06-17 LAB — BASIC METABOLIC PANEL
ANION GAP: 6 (ref 5–15)
BUN: 22 mg/dL — ABNORMAL HIGH (ref 6–20)
CALCIUM: 8.2 mg/dL — AB (ref 8.9–10.3)
CHLORIDE: 111 mmol/L (ref 101–111)
CO2: 22 mmol/L (ref 22–32)
Creatinine, Ser: 1.43 mg/dL — ABNORMAL HIGH (ref 0.61–1.24)
GFR calc non Af Amer: 45 mL/min — ABNORMAL LOW (ref >60)
GFR, EST AFRICAN AMERICAN: 53 mL/min — AB (ref >60)
GLUCOSE: 95 mg/dL (ref 65–99)
POTASSIUM: 3.4 mmol/L — AB (ref 3.5–5.1)
Sodium: 139 mmol/L (ref 135–145)

## 2016-06-17 LAB — GLUCOSE, CAPILLARY: Glucose-Capillary: 86 mg/dL (ref 65–99)

## 2016-06-17 MED ORDER — SACCHAROMYCES BOULARDII 250 MG PO CAPS: 250.0000 mg | ORAL_CAPSULE | Freq: Two times a day (BID) | ORAL | 0 refills | Status: DC

## 2016-06-17 MED ORDER — LOPERAMIDE HCL 2 MG PO CAPS
2.0000 mg | ORAL_CAPSULE | ORAL | 0 refills | Status: DC | PRN
Start: 2016-06-17 — End: 2017-06-26

## 2016-06-17 MED ORDER — POTASSIUM CHLORIDE CRYS ER 20 MEQ PO TBCR
60.0000 meq | EXTENDED_RELEASE_TABLET | Freq: Once | ORAL | Status: AC
  Administered 2016-06-17: 60 meq via ORAL
  Filled 2016-06-17: qty 3

## 2016-06-17 MED ORDER — BENZONATATE 100 MG PO CAPS: 100.0000 mg | ORAL_CAPSULE | Freq: Three times a day (TID) | ORAL | 0 refills | Status: DC | PRN

## 2016-06-17 MED ORDER — AMOXICILLIN-POT CLAVULANATE 875-125 MG PO TABS: 1.0000 | ORAL_TABLET | Freq: Two times a day (BID) | ORAL | 0 refills | Status: DC

## 2016-06-17 MED ORDER — ENOXAPARIN SODIUM 40 MG/0.4ML ~~LOC~~ SOLN: 40.0000 mg | SUBCUTANEOUS | Status: DC

## 2016-06-17 MED ORDER — LOPERAMIDE HCL 2 MG PO CAPS
2.0000 mg | ORAL_CAPSULE | ORAL | Status: DC | PRN
  Administered 2016-06-17: 2 mg via ORAL
  Filled 2016-06-17: qty 1

## 2016-06-17 NOTE — Progress Notes (Signed)
Physical Therapy Treatment Patient Details Name: Brian Montgomery MRN: [id number] DOB: 03-30-1936 Today's Date: 06/17/2016    History of Present Illness Millan Otts is a 80 year old gentleman history of muscle invasive high-grade papillary urothelial carcinoma diagnosed in July 2017 status post TURBT on 01/21/2016 receiving definitive radiation therapy with weekly carboplatin presented from radiation treatment with generalized weakness. Patient also noted to be septic.    PT Comments    Assisted OOB to amb to bathroom then in hallway.  Very unsteady gait with poor initiation and short shuffled steps.  Pt plans to D/C to SNF  Follow Up Recommendations  SNF     Equipment Recommendations       Recommendations for Other Services       Precautions / Restrictions Precautions Precautions: Fall Precaution Comments: monitor O2 Restrictions Weight Bearing Restrictions: No    Mobility  Bed Mobility Overal bed mobility: Needs Assistance Bed Mobility: Supine to Sit     Supine to sit: Mod assist;Max assist     General bed mobility comments: assist to elevate trunk, incr time.  Difficulty performing self righting to midline.    Transfers Overall transfer level: Needs assistance Equipment used: Rolling walker (2 wheeled) Transfers: Sit to/from Stand Sit to Stand: Mod assist;Max assist;+2 safety/equipment         General transfer comment: multi-modal cues for hand placement, anterior wt shift + 2 assist for safety.  Ambulation/Gait Ambulation/Gait assistance: Mod assist;+2 safety/equipment Ambulation Distance (Feet): 75 Feet Assistive device: Rolling walker (2 wheeled) Gait Pattern/deviations: Step-to pattern;Step-through pattern;Decreased step length - right;Decreased step length - left;Shuffle;Festinating;Ataxic Gait velocity: decreased   General Gait Details: ataxic, unsteady gait with 75% VC's for proper walker to self distance and upright posture.  Avg RA 99% and HR  92   Stairs            Wheelchair Mobility    Modified Rankin (Stroke Patients Only)       Balance                                    Cognition Arousal/Alertness: Awake/alert Behavior During Therapy: WFL for tasks assessed/performed                        Exercises      General Comments        Pertinent Vitals/Pain Pain Assessment: No/denies pain    Home Living                      Prior Function            PT Goals (current goals can now be found in the care plan section) Progress towards PT goals: Progressing toward goals    Frequency    Min 3X/week      PT Plan Current plan remains appropriate    Co-evaluation             End of Session Equipment Utilized During Treatment: Gait belt Activity Tolerance: Patient tolerated treatment well Patient left: in chair;with call bell/phone within reach     Time: 1038-1105 PT Time Calculation (min) (ACUTE ONLY): 27 min  Charges:  $Gait Training: 8-22 mins $Therapeutic Activity: 8-22 mins                    G Codes:      Rica Koyanagi  PTA [facility]  Acute  Rehab Pager      [phone]

## 2016-06-17 NOTE — Clinical Social Work Placement (Signed)
Patient is set to discharge to [facility] today. Patient, wife & daughters made aware. Discharge packet given to RN, Shirlee Limerick. [facility] called for transport.     Raynaldo Opitz, [facility] Clinical Social Worker cell #: [phone]    CLINICAL SOCIAL WORK PLACEMENT  NOTE  Date:  06/17/2016  Patient Details  Name: Brian Montgomery MRN: [id number] Date of Birth: November 04, 1935  Clinical Social Work is seeking post-discharge placement for this patient at the [place] level of care (*CSW will initial, date and re-position this form in  chart as items are completed):  Yes   Patient/family provided with [place] Work Department's list of facilities offering this level of care within the geographic area requested by the patient (or if unable, by the patient's family).  Yes   Patient/family informed of their freedom to choose among providers that offer the needed level of care, that participate in Medicare, Medicaid or managed care program needed by the patient, have an available bed and are willing to accept the patient.  Yes   Patient/family informed of [facility]'s ownership interest in [facility] and [facility], as well as of the fact that they are under no obligation to receive care at these facilities.  PASRR submitted to EDS on 06/15/16     PASRR number received on 06/15/16     Existing PASRR number confirmed on       FL2 transmitted to all facilities in geographic area requested by pt/family on 06/15/16     FL2 transmitted to all facilities within larger geographic area on       Patient informed that his/her managed care company has contracts with or will negotiate with certain facilities, including the following:        Yes   Patient/family informed of bed offers received.  Patient chooses bed at [facility]     Physician recommends and patient chooses bed at      Patient to be transferred to  [facility] on 06/17/16.  Patient to be transferred to facility by [facility]     Patient family notified on 06/17/16 of transfer.  Name of family member notified:  patient's wife & daughters     PHYSICIAN       Additional Comment:    _______________________________________________ Standley Brooking, LCSW 06/17/2016, 1:32 PM

## 2016-06-17 NOTE — Discharge Summary (Signed)
Physician Discharge Summary  Brian Montgomery [id number] DOB: [birth year] DOA: 06/13/2016  PCP: Wyatt Haste, MD  Admit date: 06/13/2016 Discharge date: 06/17/2016  Admitted From: Home Disposition:  SNF  Recommendations for Outpatient Follow-up:  1. Follow up with PCP in 1-2 weeks 2. Please obtain BMP/CBC in one week 3. Recommend repeat CXR in 2-3 weeks to ensure resolution of L sided infiltrate 4. Five more days of antibiotics after discharge  Discharge Condition:Improved CODE STATUS:Full Diet recommendation: Regular, thin liquids   Brief/Interim Summary: 80 year old gentleman history of muscle invasive high-grade papillary urothelial carcinoma diagnosed in July 2017 status post TURBT on 01/21/2016 receiving definitive radiation therapy with weekly carboplatin presented from radiation treatment with generalized weakness. Patient also noted to be septic.  #1 Pneumonia with sepsis present on admission - Patient initially presented with generalized weakness, presented with fevers of 103F, tachycardia, tachypnea. - CXR reviewed with findings of possible bibasilar infiltrates - Blood cx neg, urine cx remain neg - Transitioned to augmentin PO - Patient remained afebrile. No leukocytosis at time of discharge - Recommend repeat CXR in 2-3 weeks to ensure resolution  #2 generalized weakness - PT/OT consulted - Plans for SNF at discharge  #3 acute renal failure - Cr continues to improve with hydration - By day of discharge, Cr is at/near baseline  #4 elevated troponin - most recent trop down to 0.03 on 12/5 - Suspect trop leak in the setting of sepsis  #5 dehydration - Improved with IVF hydration - decrease ivf to 75cc/hr  #6 hypertension - bp remain stable this AM - Initially held ACEI and diuretic remain secondary to ARF and dehydration - Would resume home bp meds on discharge  #7 gait instability - For now, continue Sinemet and Aricept.  - PT recommends SNF at  discharge  #8 muscle invasive high-grade papillary urothelial carcinoma status post TURBT(01/21/2016) - Patient continues with radiation tx with weekly carboplatin that was started on 10/19 - Receiving radiation tx   #9 transient confusion - worse over night, improved later in day - family reports hx of known dementia that is followed by Neurology  Discharge Diagnoses:  Principal Problem:   Sepsis ([place]) Active Problems:   Essential hypertension, benign   Hyperlipidemia with target LDL less than 100   Gait instability   Bladder cancer ([place])   Malignant neoplasm of prostate ([place])   Dehydration   Weakness generalized   Elevated troponin   ARF (acute renal failure) ([place])    Discharge Instructions     Medication List    STOP taking these medications   prochlorperazine 10 MG tablet Commonly known as:  COMPAZINE     TAKE these medications   amoxicillin-clavulanate 875-125 MG tablet Commonly known as:  AUGMENTIN Take 1 tablet by mouth every 12 (twelve) hours.   benzonatate 100 MG capsule Commonly known as:  TESSALON Take 1 capsule (100 mg total) by mouth 3 (three) times daily as needed for cough.   carbidopa-levodopa 25-100 MG tablet Commonly known as:  SINEMET IR TAKE 1/2 TABLET BY MOUTH TWICE DAILY FOR 3 DAYS, 1/2 TABLET THREE TIMES DAILY FOR 3 DAYS, THEN 1 TABLET THREE TIMES DAILY   donepezil 5 MG tablet Commonly known as:  ARICEPT Take 1 tablet (5 mg total) by mouth at bedtime.   HYDROcodone-acetaminophen 5-325 MG tablet Commonly known as:  NORCO/VICODIN Take 1-2 tablets by mouth every 6 (six) hours as needed. What changed:  additional instructions   lisinopril-hydrochlorothiazide 10-12.5 MG tablet Commonly known as:  PRINZIDE,ZESTORETIC Take  1 tablet by mouth daily.   loperamide 2 MG capsule Commonly known as:  IMODIUM Take 1 capsule (2 mg total) by mouth as needed for diarrhea or loose stools.   phenazopyridine 100 MG tablet Commonly known as:   PYRIDIUM Take 1 tablet (100 mg total) by mouth 3 (three) times daily as needed for pain (for burning).   saccharomyces boulardii 250 MG capsule Commonly known as:  FLORASTOR Take 1 capsule (250 mg total) by mouth 2 (two) times daily.   simvastatin 20 MG tablet Commonly known as:  ZOCOR TAKE 1 TABLET BY MOUTH DAILY      Follow-up Information    Wyatt Haste, MD. Schedule an appointment as soon as possible for a visit in 1 week(s).   Specialty:  Family Medicine Contact information: [place] [address] [phone]          No Known Allergies  Consultations:  Radiation Oncology  Procedures/Studies: X-ray Chest Pa And Lateral  Result Date: 06/14/2016 CLINICAL DATA:  Generalized weakness EXAM: CHEST  2 VIEW COMPARISON:  06/13/2016 FINDINGS: Progression of bibasilar airspace disease since the prior study. Negative for heart failure. Negative for pleural effusion. IMPRESSION: Progression of bibasilar atelectasis/infiltrate. Electronically Signed   By: Franchot Gallo M.D.   On: 06/14/2016 11:51   Dg Chest 2 View  Result Date: 06/13/2016 CLINICAL DATA:  Tachycardia, weakness and dizziness. EXAM: CHEST  2 VIEW COMPARISON:  None. FINDINGS: The heart size and mediastinal contours are within normal limits. Both lungs are clear. The visualized skeletal structures are unremarkable. IMPRESSION: No active cardiopulmonary disease. Electronically Signed   By: Fidela Salisbury M.D.   On: 06/13/2016 14:29   Dg Lumbar Spine Complete  Result Date: 06/13/2016 CLINICAL DATA:  Bilateral lower extremity weakness. Unsteady gait. Altered mental status. Recent radiation treatment for prostate cancer. EXAM: LUMBAR SPINE - COMPLETE 4+ VIEW COMPARISON:  CT abdomen and pelvis 01/29/2016 FINDINGS: There 5 non rib-bearing lumbar type vertebral bodies. Vertebral body heights are preserved without evidence of fracture. Moderate disc space narrowing is present at L4-5 and  L5-S1, with mild disc space narrowing more proximally in the lumbar spine. Vacuum disc is apparent at L2-3, L3-4, and likely L1-2. Mild-to-moderate marginal endplate osteophyte formation is present throughout the lumbar spine. No pars defects are identified. There is mild-to-moderate mid to lower lumbar spine facet arthrosis. Fiducials overlie the prostate. Aortic atherosclerosis is noted. IMPRESSION: 1. No evidence of acute osseous abnormality. 2. Moderate lumbar disc degeneration. 3. Aortic atherosclerosis. Electronically Signed   By: Logan Bores M.D.   On: 06/13/2016 14:31   Ct Head Wo Contrast  Result Date: 06/13/2016 CLINICAL DATA:  Altered mental status.  Bilateral leg weakness EXAM: CT HEAD WITHOUT CONTRAST CT CERVICAL SPINE WITHOUT CONTRAST TECHNIQUE: Multidetector CT imaging of the head and cervical spine was performed following the standard protocol without intravenous contrast. Multiplanar CT image reconstructions of the cervical spine were also generated. COMPARISON:  MRI head 10/27/2014.  MRI cervical spine 09/01/2015 FINDINGS: CT HEAD FINDINGS Brain: Generalized atrophy. Chronic microvascular ischemic changes in the white matter. Negative for acute infarct. Negative for hemorrhage or mass. Vascular: Negative for dense MCA. Skull: Negative for fracture Sinuses/Orbits: With negative Other: None CT CERVICAL SPINE FINDINGS Alignment: 3 mm anterior listhesis at C7-T1. Remaining alignment normal. Straightening of the cervical lordosis Skull base and vertebrae: Negative for fracture. Soft tissues and spinal canal: Atherosclerotic calcification. No soft tissue mass or swelling Disc levels: Disc degeneration and spurring at C3-4, C4-5, C5-6, and  C6-7 of a moderate degree. Degenerative anterolisthesis C7-T1 due to advanced facet degeneration. Upper chest: Negative Other: None IMPRESSION: Atrophy and chronic microvascular ischemic change. No acute intracranial abnormality Moderate cervical degenerative  change. Negative for cervical spine fracture. Electronically Signed   By: Franchot Gallo M.D.   On: 06/13/2016 14:06   Ct Cervical Spine Wo Contrast  Result Date: 06/13/2016 CLINICAL DATA:  Altered mental status.  Bilateral leg weakness EXAM: CT HEAD WITHOUT CONTRAST CT CERVICAL SPINE WITHOUT CONTRAST TECHNIQUE: Multidetector CT imaging of the head and cervical spine was performed following the standard protocol without intravenous contrast. Multiplanar CT image reconstructions of the cervical spine were also generated. COMPARISON:  MRI head 10/27/2014.  MRI cervical spine 09/01/2015 FINDINGS: CT HEAD FINDINGS Brain: Generalized atrophy. Chronic microvascular ischemic changes in the white matter. Negative for acute infarct. Negative for hemorrhage or mass. Vascular: Negative for dense MCA. Skull: Negative for fracture Sinuses/Orbits: With negative Other: None CT CERVICAL SPINE FINDINGS Alignment: 3 mm anterior listhesis at C7-T1. Remaining alignment normal. Straightening of the cervical lordosis Skull base and vertebrae: Negative for fracture. Soft tissues and spinal canal: Atherosclerotic calcification. No soft tissue mass or swelling Disc levels: Disc degeneration and spurring at C3-4, C4-5, C5-6, and C6-7 of a moderate degree. Degenerative anterolisthesis C7-T1 due to advanced facet degeneration. Upper chest: Negative Other: None IMPRESSION: Atrophy and chronic microvascular ischemic change. No acute intracranial abnormality Moderate cervical degenerative change. Negative for cervical spine fracture. Electronically Signed   By: Franchot Gallo M.D.   On: 06/13/2016 14:06   US Renal  Result Date: 06/14/2016 CLINICAL DATA:  Weakness. Increased creatinine. Acute renal insufficiency. Bladder cancer. EXAM: RENAL / URINARY TRACT ULTRASOUND COMPLETE COMPARISON:  None. FINDINGS: Right Kidney: Length: 11.9 cm, within normal limits. Echogenicity within normal limits. No mass or hydronephrosis visualized. Left  Kidney: Length: 11.7 cm, within normal limits. Echogenicity within normal limits. No mass or hydronephrosis visualized. Bladder: Appears normal for degree of bladder distention. IMPRESSION: Negative bilateral renal ultrasound. Electronically Signed   By: San Morelle M.D.   On: 06/14/2016 13:01   Dg Chest Port 1 View  Result Date: 06/17/2016 CLINICAL DATA:  Cough.  History of pneumonia. EXAM: PORTABLE CHEST 1 VIEW COMPARISON:  PA and lateral chest 06/13/2016 and 06/14/2016. FINDINGS: Right basilar airspace disease does not appear notably changed since the most recent exam. Minimal atelectasis is present in the left lung base. No pneumothorax or pleural effusion. Heart size is normal. IMPRESSION: No change in right basilar airspace disease since the most recent exam. Electronically Signed   By: Inge Rise M.D.   On: 06/17/2016 10:51    Subjective: No complaints  Discharge Exam: Vitals:   06/16/16 2112 06/17/16 1226  BP: 134/75 130/89  Pulse: 95 88  Resp: 18 16  Temp: 99.8 F (37.7 C) 97.9 F (36.6 C)   Vitals:   06/15/16 1300 06/16/16 0656 06/16/16 2112 06/17/16 1226  BP: 127/63 138/74 134/75 130/89  Pulse: 80 81 95 88  Resp: (!) 21 20 18 16   Temp: 98.4 F (36.9 C) 98.1 F (36.7 C) 99.8 F (37.7 C) 97.9 F (36.6 C)  TempSrc: Oral Oral Oral Oral  SpO2: 100% 98% 99% 99%  Weight:  79.8 kg (175 lb 14.8 oz) 78.6 kg (173 lb 4.5 oz)   Height:        General: Pt is alert, awake, not in acute distress Cardiovascular: RRR, S1/S2 +, no rubs, no gallops Respiratory: CTA bilaterally, no wheezing, no rhonchi Abdominal: Soft,  NT, ND, bowel sounds + Extremities: no edema, no cyanosis   The results of significant diagnostics from this hospitalization (including imaging, microbiology, ancillary and laboratory) are listed below for reference.     Microbiology: Recent Results (from the past 240 hour(s))  Urine culture     Status: None   Collection Time: 06/13/16 12:12 PM   Result Value Ref Range Status   Specimen Description URINE, CLEAN CATCH  Final   Special Requests NONE  Final   Culture NO GROWTH Performed at [facility]   Final   Report Status 06/15/2016 FINAL  Final  Blood culture (routine x 2)     Status: None (Preliminary result)   Collection Time: 06/13/16 12:53 PM  Result Value Ref Range Status   Specimen Description BLOOD LEFT ANTECUBITAL  Final   Special Requests BOTTLES DRAWN AEROBIC AND ANAEROBIC 5CC  Final   Culture   Final    NO GROWTH 4 DAYS Performed at [facility]    Report Status PENDING  Incomplete  Blood culture (routine x 2)     Status: None (Preliminary result)   Collection Time: 06/13/16  6:30 PM  Result Value Ref Range Status   Specimen Description   Final    BLOOD RIGHT ANTECUBITAL Performed at [place] Requests   Final    [place] Performed at [place]   Final    NO GROWTH 4 DAYS Performed at [facility]    Report Status PENDING  Incomplete  MRSA PCR Screening     Status: None   Collection Time: 06/15/16 10:32 AM  Result Value Ref Range Status   MRSA by PCR NEGATIVE NEGATIVE Final    Comment:        The GeneXpert MRSA Assay (FDA approved for NASAL specimens only), is one component of a comprehensive MRSA colonization surveillance program. It is not intended to diagnose MRSA infection nor to guide or monitor treatment for MRSA infections.      Labs: BNP (last 3 results) No results for input(s): BNP in the last 8760 hours. Basic Metabolic Panel:  Recent Labs Lab 06/13/16 1253 06/14/16 0159 06/15/16 0346 06/16/16 0407 06/17/16 0505  NA 139 138 135 138 139  K 3.7 3.4* 3.5 3.4* 3.4*  CL 104 109 109 110 111  CO2 23 19* 20* 22 22  GLUCOSE 129* 104* 100* 96 95  BUN 43* 41* 32* 26* 22*  CREATININE 2.17* 2.06* 1.75* 1.62* 1.43*  CALCIUM 9.3 8.3* 8.0* 8.1* 8.2*  MG  --  1.7 2.2   --   --    Liver Function Tests:  Recent Labs Lab 06/13/16 1253 06/14/16 0159  AST 57* 68*  ALT 24 13*  ALKPHOS 77 66  BILITOT 1.2 1.0  PROT 8.6* 7.0  ALBUMIN 3.8 3.1*   No results for input(s): LIPASE, AMYLASE in the last 168 hours.  Recent Labs Lab 06/13/16 1253  AMMONIA 9   CBC:  Recent Labs Lab 06/13/16 1253 06/14/16 0159 06/15/16 0346  WBC 8.1 6.8 4.9  NEUTROABS 6.9  --   --   HGB 12.3* 10.8* 9.9*  HCT 36.2* 31.9* 28.3*  MCV 88.7 88.4 86.8  PLT 174 163 145*   Cardiac Enzymes:  Recent Labs Lab 06/13/16 1253 06/13/16 2016 06/14/16 0159 06/14/16 0822  TROPONINI 0.03* 0.03* 0.04* 0.03*   BNP: Invalid input(s): POCBNP CBG:  Recent Labs Lab 06/13/16  1307 06/14/16 0741 06/15/16 0746 06/16/16 0744 06/17/16 0730  GLUCAP 119* 91 102* 86 86   D-Dimer No results for input(s): DDIMER in the last 72 hours. Hgb A1c No results for input(s): HGBA1C in the last 72 hours. Lipid Profile No results for input(s): CHOL, HDL, LDLCALC, TRIG, CHOLHDL, LDLDIRECT in the last 72 hours. Thyroid function studies No results for input(s): TSH, T4TOTAL, T3FREE, THYROIDAB in the last 72 hours.  Invalid input(s): FREET3 Anemia work up No results for input(s): VITAMINB12, FOLATE, FERRITIN, TIBC, IRON, RETICCTPCT in the last 72 hours. Urinalysis    Component Value Date/Time   COLORURINE AMBER (A) 06/13/2016 1212   APPEARANCEUR TURBID (A) 06/13/2016 1212   LABSPEC 1.020 06/13/2016 1212   PHURINE 5.0 06/13/2016 1212   GLUCOSEU NEGATIVE 06/13/2016 1212   HGBUR MODERATE (A) 06/13/2016 1212   BILIRUBINUR SMALL (A) 06/13/2016 1212   BILIRUBINUR n 12/02/2015 1535   KETONESUR NEGATIVE 06/13/2016 1212   PROTEINUR 100 (A) 06/13/2016 1212   UROBILINOGEN negative 12/02/2015 1535   UROBILINOGEN 0.2 02/17/2010 1259   NITRITE NEGATIVE 06/13/2016 1212   LEUKOCYTESUR SMALL (A) 06/13/2016 1212   Sepsis Labs Invalid input(s): PROCALCITONIN,  WBC,   LACTICIDVEN Microbiology Recent Results (from the past 240 hour(s))  Urine culture     Status: None   Collection Time: 06/13/16 12:12 PM  Result Value Ref Range Status   Specimen Description URINE, CLEAN CATCH  Final   Special Requests NONE  Final   Culture NO GROWTH Performed at [facility]   Final   Report Status 06/15/2016 FINAL  Final  Blood culture (routine x 2)     Status: None (Preliminary result)   Collection Time: 06/13/16 12:53 PM  Result Value Ref Range Status   Specimen Description BLOOD LEFT ANTECUBITAL  Final   Special Requests BOTTLES DRAWN AEROBIC AND ANAEROBIC 5CC  Final   Culture   Final    NO GROWTH 4 DAYS Performed at [facility]    Report Status PENDING  Incomplete  Blood culture (routine x 2)     Status: None (Preliminary result)   Collection Time: 06/13/16  6:30 PM  Result Value Ref Range Status   Specimen Description   Final    BLOOD RIGHT ANTECUBITAL Performed at [place] Requests   Final    [place] Performed at [place]   Final    NO GROWTH 4 DAYS Performed at [facility]    Report Status PENDING  Incomplete  MRSA PCR Screening     Status: None   Collection Time: 06/15/16 10:32 AM  Result Value Ref Range Status   MRSA by PCR NEGATIVE NEGATIVE Final    Comment:        The GeneXpert MRSA Assay (FDA approved for NASAL specimens only), is one component of a comprehensive MRSA colonization surveillance program. It is not intended to diagnose MRSA infection nor to guide or monitor treatment for MRSA infections.      SIGNED:   Donne Hazel, MD  [facility] 06/17/2016, 1:23 PM  If 7PM-7AM, please contact night-coverage [email or web address] Password [email or web address]

## 2016-06-17 NOTE — Progress Notes (Signed)
Faxed FMLA papers for patient's daughters: [name]- to [place]- to [place]

## 2016-06-17 NOTE — Care Management Important Message (Signed)
Important Message  Patient Details  Name: Brian Montgomery MRN: [id number] Date of Birth: 09/25/1935   Medicare Important Message Given:  Yes    Camillo Flaming 06/17/2016, 12:59 [place] Message  Patient Details  Name: Brian Montgomery MRN: [id number] Date of Birth: 1935-12-15   Medicare Important Message Given:  Yes    Camillo Flaming 06/17/2016, 12:59 PM

## 2016-06-17 NOTE — Progress Notes (Signed)
  Radiation Oncology         [phone]) [phone] ________________________________  Name: Brian Montgomery MRN: [id number]  Date: 06/17/2016  DOB: 1936-05-21  End of Treatment Note  Diagnosis: T2N0 infiltrative high grade papillary urothelial carcinoma invading the muscularis propria (detrusor muscle) and T2a adenocarcinoma of the prostate Gleason's 3+3=6  Indication for treatment:  Curative, definitive radiation therapy with weekly carboplatin. Status post TURBT on 01/21/16  Radiation treatment dates:   04/26/16 - 06/17/16  Site/dose:  1) Pelvic Nodes were treated to 45 Gy in 25 fractions. 2) Bladder/Prostate Boost was 20 Gy in 10 fractions. 3) Final Prostate Boost was 6 Gy in 3 fractions out of 10 Gy in 5 fractions.     Total Dose 71 Gy  Beams/energy:   The patient was treated with IMRT using volumetric arc therapy delivering 6 MV X-rays to circumferential arcs.  Image guidance was performed with daily cone beam CT prior to each fraction to align to gold markers in the prostate and assure proper bladder and rectal fill volumes.  Immobilization was achieved with BodyFix custom mold.  Narrative: The patient experienced nocturia x3-4, occasional urinary leakage, and fatigue. On 06/13/16, the patient presented to the ED for a new onset of the inability to walk, generalized weakness in the lower extremities, and somnolence. He has fallen a couple of times as well. CT of the cervical spin and head were negative for metastatic disease or fractures. X ray of the lumbar spine showed no evidence of acute osseous abnormalities. The patient was admitted for sepsis, dehydration, and acute renal failure. Repeat chest X ray on 06/14/16 showed pneumonia. Dr. Alen Blew decided to cancel the patient's final chemotherapy and the patient's radiation was cut by 2 fractions.  Plan: The patient has completed radiation treatment. He will return to radiation oncology clinic for routine followup in one month. I advised him to call or  return sooner if he has any questions or concerns related to his recovery or treatment. ________________________________  Sheral Apley. Tammi Klippel, M.D.  This document serves as a record of services personally performed by Tyler Pita, MD. It was created on his behalf by Darcus Austin, a trained medical scribe. The creation of this record is based on the scribe's personal observations and the provider's statements to them. This document has been checked and approved by the attending provider.

## 2016-06-18 LAB — CULTURE, BLOOD (ROUTINE X 2)
CULTURE: NO GROWTH
CULTURE: NO GROWTH

## 2016-06-20 ENCOUNTER — Ambulatory Visit: Payer: Medicare Other

## 2016-06-20 DIAGNOSIS — F0390 Unspecified dementia without behavioral disturbance: Secondary | ICD-10-CM | POA: Diagnosis not present

## 2016-06-20 DIAGNOSIS — C679 Malignant neoplasm of bladder, unspecified: Secondary | ICD-10-CM | POA: Diagnosis not present

## 2016-06-20 DIAGNOSIS — I10 Essential (primary) hypertension: Secondary | ICD-10-CM | POA: Diagnosis not present

## 2016-06-20 DIAGNOSIS — J189 Pneumonia, unspecified organism: Secondary | ICD-10-CM | POA: Diagnosis not present

## 2016-06-21 ENCOUNTER — Ambulatory Visit: Payer: Medicare Other

## 2016-06-24 DIAGNOSIS — C679 Malignant neoplasm of bladder, unspecified: Secondary | ICD-10-CM | POA: Diagnosis not present

## 2016-06-24 DIAGNOSIS — F0390 Unspecified dementia without behavioral disturbance: Secondary | ICD-10-CM | POA: Diagnosis not present

## 2016-06-24 DIAGNOSIS — N390 Urinary tract infection, site not specified: Secondary | ICD-10-CM | POA: Diagnosis not present

## 2016-06-24 DIAGNOSIS — I10 Essential (primary) hypertension: Secondary | ICD-10-CM | POA: Diagnosis not present

## 2016-06-24 DIAGNOSIS — A419 Sepsis, unspecified organism: Secondary | ICD-10-CM | POA: Diagnosis not present

## 2016-06-24 DIAGNOSIS — E46 Unspecified protein-calorie malnutrition: Secondary | ICD-10-CM | POA: Diagnosis not present

## 2016-06-24 DIAGNOSIS — N179 Acute kidney failure, unspecified: Secondary | ICD-10-CM | POA: Diagnosis not present

## 2016-06-24 DIAGNOSIS — G20 Parkinson's disease: Secondary | ICD-10-CM | POA: Diagnosis not present

## 2016-06-24 DIAGNOSIS — R2689 Other abnormalities of gait and mobility: Secondary | ICD-10-CM | POA: Diagnosis not present

## 2016-06-29 DIAGNOSIS — C679 Malignant neoplasm of bladder, unspecified: Secondary | ICD-10-CM | POA: Diagnosis not present

## 2016-06-29 DIAGNOSIS — R634 Abnormal weight loss: Secondary | ICD-10-CM | POA: Diagnosis not present

## 2016-06-29 DIAGNOSIS — I10 Essential (primary) hypertension: Secondary | ICD-10-CM | POA: Diagnosis not present

## 2016-06-29 DIAGNOSIS — J189 Pneumonia, unspecified organism: Secondary | ICD-10-CM | POA: Diagnosis not present

## 2016-06-30 ENCOUNTER — Inpatient Hospital Stay (HOSPITAL_COMMUNITY)
Admission: EM | Admit: 2016-06-30 | Discharge: 2016-07-03 | DRG: 482 | Disposition: A | Discharge status: Skilled Nursing Facility | Payer: Medicare Other | Attending: Orthopedic Surgery | Admitting: Orthopedic Surgery

## 2016-06-30 ENCOUNTER — Inpatient Hospital Stay (HOSPITAL_COMMUNITY): Payer: Medicare Other

## 2016-06-30 ENCOUNTER — Emergency Department (HOSPITAL_COMMUNITY): Payer: Medicare Other

## 2016-06-30 ENCOUNTER — Encounter (HOSPITAL_COMMUNITY): Payer: Self-pay | Admitting: Emergency Medicine

## 2016-06-30 DIAGNOSIS — R2689 Other abnormalities of gait and mobility: Secondary | ICD-10-CM | POA: Diagnosis not present

## 2016-06-30 DIAGNOSIS — N183 Chronic kidney disease, stage 3 unspecified: Secondary | ICD-10-CM | POA: Diagnosis present

## 2016-06-30 DIAGNOSIS — R079 Chest pain, unspecified: Secondary | ICD-10-CM | POA: Diagnosis not present

## 2016-06-30 DIAGNOSIS — W19XXXA Unspecified fall, initial encounter: Secondary | ICD-10-CM

## 2016-06-30 DIAGNOSIS — R000 Tachycardia, unspecified: Secondary | ICD-10-CM | POA: Diagnosis not present

## 2016-06-30 DIAGNOSIS — Z4789 Encounter for other orthopedic aftercare: Secondary | ICD-10-CM | POA: Diagnosis not present

## 2016-06-30 DIAGNOSIS — E785 Hyperlipidemia, unspecified: Secondary | ICD-10-CM | POA: Diagnosis present

## 2016-06-30 DIAGNOSIS — G20 Parkinson's disease: Secondary | ICD-10-CM | POA: Diagnosis present

## 2016-06-30 DIAGNOSIS — Z87891 Personal history of nicotine dependence: Secondary | ICD-10-CM

## 2016-06-30 DIAGNOSIS — I129 Hypertensive chronic kidney disease with stage 1 through stage 4 chronic kidney disease, or unspecified chronic kidney disease: Secondary | ICD-10-CM | POA: Diagnosis present

## 2016-06-30 DIAGNOSIS — C61 Malignant neoplasm of prostate: Secondary | ICD-10-CM | POA: Diagnosis not present

## 2016-06-30 DIAGNOSIS — F0390 Unspecified dementia without behavioral disturbance: Secondary | ICD-10-CM | POA: Diagnosis present

## 2016-06-30 DIAGNOSIS — S299XXA Unspecified injury of thorax, initial encounter: Secondary | ICD-10-CM | POA: Diagnosis not present

## 2016-06-30 DIAGNOSIS — J189 Pneumonia, unspecified organism: Secondary | ICD-10-CM | POA: Diagnosis not present

## 2016-06-30 DIAGNOSIS — I10 Essential (primary) hypertension: Secondary | ICD-10-CM | POA: Diagnosis not present

## 2016-06-30 DIAGNOSIS — Z79899 Other long term (current) drug therapy: Secondary | ICD-10-CM | POA: Diagnosis not present

## 2016-06-30 DIAGNOSIS — S8992XA Unspecified injury of left lower leg, initial encounter: Secondary | ICD-10-CM | POA: Diagnosis not present

## 2016-06-30 DIAGNOSIS — S72142A Displaced intertrochanteric fracture of left femur, initial encounter for closed fracture: Secondary | ICD-10-CM | POA: Diagnosis present

## 2016-06-30 DIAGNOSIS — S72002A Fracture of unspecified part of neck of left femur, initial encounter for closed fracture: Secondary | ICD-10-CM | POA: Diagnosis not present

## 2016-06-30 DIAGNOSIS — C679 Malignant neoplasm of bladder, unspecified: Secondary | ICD-10-CM | POA: Diagnosis present

## 2016-06-30 DIAGNOSIS — N179 Acute kidney failure, unspecified: Secondary | ICD-10-CM | POA: Diagnosis not present

## 2016-06-30 DIAGNOSIS — S72009A Fracture of unspecified part of neck of unspecified femur, initial encounter for closed fracture: Secondary | ICD-10-CM | POA: Diagnosis present

## 2016-06-30 DIAGNOSIS — R41841 Cognitive communication deficit: Secondary | ICD-10-CM | POA: Diagnosis not present

## 2016-06-30 DIAGNOSIS — Z419 Encounter for procedure for purposes other than remedying health state, unspecified: Secondary | ICD-10-CM

## 2016-06-30 DIAGNOSIS — Y92129 Unspecified place in nursing home as the place of occurrence of the external cause: Secondary | ICD-10-CM

## 2016-06-30 DIAGNOSIS — M25562 Pain in left knee: Secondary | ICD-10-CM | POA: Diagnosis not present

## 2016-06-30 DIAGNOSIS — W010XXA Fall on same level from slipping, tripping and stumbling without subsequent striking against object, initial encounter: Secondary | ICD-10-CM | POA: Diagnosis present

## 2016-06-30 DIAGNOSIS — R259 Unspecified abnormal involuntary movements: Secondary | ICD-10-CM | POA: Diagnosis not present

## 2016-06-30 DIAGNOSIS — M79652 Pain in left thigh: Secondary | ICD-10-CM | POA: Diagnosis not present

## 2016-06-30 DIAGNOSIS — R278 Other lack of coordination: Secondary | ICD-10-CM | POA: Diagnosis not present

## 2016-06-30 DIAGNOSIS — M25552 Pain in left hip: Secondary | ICD-10-CM | POA: Diagnosis present

## 2016-06-30 DIAGNOSIS — M1990 Unspecified osteoarthritis, unspecified site: Secondary | ICD-10-CM | POA: Diagnosis not present

## 2016-06-30 DIAGNOSIS — G609 Hereditary and idiopathic neuropathy, unspecified: Secondary | ICD-10-CM | POA: Diagnosis not present

## 2016-06-30 DIAGNOSIS — R262 Difficulty in walking, not elsewhere classified: Secondary | ICD-10-CM

## 2016-06-30 DIAGNOSIS — S0990XA Unspecified injury of head, initial encounter: Secondary | ICD-10-CM | POA: Diagnosis not present

## 2016-06-30 HISTORY — DX: Parkinson's disease: G20

## 2016-06-30 HISTORY — DX: Parkinson's disease without dyskinesia, without mention of fluctuations: G20.A1

## 2016-06-30 LAB — BASIC METABOLIC PANEL
Anion gap: 10 (ref 5–15)
BUN: 26 mg/dL — ABNORMAL HIGH (ref 6–20)
CHLORIDE: 103 mmol/L (ref 101–111)
CO2: 24 mmol/L (ref 22–32)
CREATININE: 1.74 mg/dL — AB (ref 0.61–1.24)
Calcium: 9 mg/dL (ref 8.9–10.3)
GFR calc non Af Amer: 36 mL/min — ABNORMAL LOW (ref >60)
GFR, EST AFRICAN AMERICAN: 41 mL/min — AB (ref >60)
Glucose, Bld: 113 mg/dL — ABNORMAL HIGH (ref 65–99)
POTASSIUM: 3.9 mmol/L (ref 3.5–5.1)
SODIUM: 137 mmol/L (ref 135–145)

## 2016-06-30 LAB — CBC
HEMATOCRIT: 29.6 % — AB (ref 39.0–52.0)
HEMOGLOBIN: 9.9 g/dL — AB (ref 13.0–17.0)
MCH: 30.1 pg (ref 26.0–34.0)
MCHC: 33.4 g/dL (ref 30.0–36.0)
MCV: 90 fL (ref 78.0–100.0)
Platelets: 157 10*3/uL (ref 150–400)
RBC: 3.29 MIL/uL — ABNORMAL LOW (ref 4.22–5.81)
RDW: 17 % — ABNORMAL HIGH (ref 11.5–15.5)
WBC: 8.6 10*3/uL (ref 4.0–10.5)

## 2016-06-30 LAB — CBC WITH DIFFERENTIAL/PLATELET
BASOS ABS: 0 10*3/uL (ref 0.0–0.1)
Basophils Relative: 0 %
EOS ABS: 0.1 10*3/uL (ref 0.0–0.7)
Eosinophils Relative: 1 %
HCT: 29 % — ABNORMAL LOW (ref 39.0–52.0)
HEMOGLOBIN: 9.9 g/dL — AB (ref 13.0–17.0)
LYMPHS ABS: 0.4 10*3/uL — AB (ref 0.7–4.0)
LYMPHS PCT: 6 %
MCH: 30.7 pg (ref 26.0–34.0)
MCHC: 34.1 g/dL (ref 30.0–36.0)
MCV: 89.8 fL (ref 78.0–100.0)
Monocytes Absolute: 0.8 10*3/uL (ref 0.1–1.0)
Monocytes Relative: 12 %
NEUTROS PCT: 81 %
Neutro Abs: 5.2 10*3/uL (ref 1.7–7.7)
Platelets: 173 10*3/uL (ref 150–400)
RBC: 3.23 MIL/uL — AB (ref 4.22–5.81)
RDW: 17 % — ABNORMAL HIGH (ref 11.5–15.5)
WBC: 6.5 10*3/uL (ref 4.0–10.5)

## 2016-06-30 LAB — TYPE AND SCREEN
ABO/RH(D): A POS
ANTIBODY SCREEN: NEGATIVE

## 2016-06-30 LAB — PROTIME-INR
INR: 1.08
PROTHROMBIN TIME: 14.1 s (ref 11.4–15.2)

## 2016-06-30 LAB — CREATININE, SERUM
Creatinine, Ser: 1.77 mg/dL — ABNORMAL HIGH (ref 0.61–1.24)
GFR calc Af Amer: 41 mL/min — ABNORMAL LOW (ref >60)
GFR, EST NON AFRICAN AMERICAN: 35 mL/min — AB (ref >60)

## 2016-06-30 LAB — ABO/RH: ABO/RH(D): A POS

## 2016-06-30 MED ORDER — MORPHINE SULFATE (PF) 4 MG/ML IV SOLN
4.0000 mg | INTRAVENOUS | Status: AC | PRN
Start: 2016-06-30 — End: 2016-06-30
  Administered 2016-06-30: 4 mg via INTRAVENOUS
  Filled 2016-06-30: qty 1

## 2016-06-30 MED ORDER — POVIDONE-IODINE 10 % EX SWAB
2.0000 "application " | Freq: Once | CUTANEOUS | Status: AC
  Administered 2016-06-30: 2 via TOPICAL

## 2016-06-30 MED ORDER — ONDANSETRON HCL 4 MG/2ML IJ SOLN: 4.0000 mg | Freq: Three times a day (TID) | INTRAMUSCULAR | Status: DC | PRN

## 2016-06-30 MED ORDER — KCL IN DEXTROSE-NACL 20-5-0.45 MEQ/L-%-% IV SOLN
INTRAVENOUS | Status: DC
  Administered 2016-06-30 – 2016-07-02 (×4): via INTRAVENOUS
  Filled 2016-06-30 (×3): qty 1000

## 2016-06-30 MED ORDER — HYDRALAZINE HCL 20 MG/ML IJ SOLN: 10.0000 mg | Freq: Three times a day (TID) | INTRAMUSCULAR | Status: DC | PRN

## 2016-06-30 MED ORDER — CEFAZOLIN SODIUM-DEXTROSE 2-4 GM/100ML-% IV SOLN
2.0000 g | INTRAVENOUS | Status: DC
  Filled 2016-06-30 (×2): qty 100

## 2016-06-30 MED ORDER — LISINOPRIL-HYDROCHLOROTHIAZIDE 10-12.5 MG PO TABS: 1.0000 | ORAL_TABLET | Freq: Every day | ORAL | Status: DC

## 2016-06-30 MED ORDER — MORPHINE SULFATE (PF) 4 MG/ML IV SOLN
4.0000 mg | INTRAVENOUS | Status: DC | PRN
  Administered 2016-06-30 (×2): 4 mg via INTRAVENOUS
  Filled 2016-06-30 (×2): qty 1

## 2016-06-30 MED ORDER — ONDANSETRON HCL 4 MG PO TABS: 4.0000 mg | ORAL_TABLET | Freq: Four times a day (QID) | ORAL | Status: DC | PRN

## 2016-06-30 MED ORDER — CHLORHEXIDINE GLUCONATE 4 % EX LIQD
60.0000 mL | Freq: Once | CUTANEOUS | Status: AC
  Administered 2016-06-30: 4 via TOPICAL
  Filled 2016-06-30: qty 60

## 2016-06-30 MED ORDER — CARBIDOPA-LEVODOPA 25-100 MG PO TABS
1.0000 | ORAL_TABLET | Freq: Three times a day (TID) | ORAL | Status: DC
  Administered 2016-06-30 – 2016-07-03 (×7): 1 via ORAL
  Filled 2016-06-30 (×7): qty 1

## 2016-06-30 MED ORDER — SIMVASTATIN 20 MG PO TABS
20.0000 mg | ORAL_TABLET | Freq: Every day | ORAL | Status: DC
  Administered 2016-07-02 – 2016-07-03 (×2): 20 mg via ORAL
  Filled 2016-06-30 (×2): qty 1

## 2016-06-30 MED ORDER — POLYETHYLENE GLYCOL 3350 17 G PO PACK: 17.0000 g | PACK | Freq: Every day | ORAL | Status: DC | PRN

## 2016-06-30 MED ORDER — ACETAMINOPHEN 325 MG PO TABS: 650.0000 mg | ORAL_TABLET | Freq: Four times a day (QID) | ORAL | Status: DC | PRN

## 2016-06-30 MED ORDER — ENOXAPARIN SODIUM 30 MG/0.3ML ~~LOC~~ SOLN
30.0000 mg | SUBCUTANEOUS | Status: DC
  Administered 2016-06-30 – 2016-07-02 (×3): 30 mg via SUBCUTANEOUS
  Filled 2016-06-30 (×3): qty 0.3

## 2016-06-30 MED ORDER — LISINOPRIL 10 MG PO TABS
10.0000 mg | ORAL_TABLET | Freq: Every day | ORAL | Status: DC
  Administered 2016-07-01 – 2016-07-03 (×3): 10 mg via ORAL
  Filled 2016-06-30 (×3): qty 1

## 2016-06-30 MED ORDER — HYDROCHLOROTHIAZIDE 12.5 MG PO CAPS
12.5000 mg | ORAL_CAPSULE | Freq: Every day | ORAL | Status: DC
  Administered 2016-07-01 – 2016-07-03 (×3): 12.5 mg via ORAL
  Filled 2016-06-30 (×3): qty 1

## 2016-06-30 MED ORDER — ONDANSETRON HCL 4 MG/2ML IJ SOLN: 4.0000 mg | Freq: Four times a day (QID) | INTRAMUSCULAR | Status: DC | PRN

## 2016-06-30 MED ORDER — DONEPEZIL HCL 5 MG PO TABS
5.0000 mg | ORAL_TABLET | Freq: Every day | ORAL | Status: DC
  Administered 2016-07-01 – 2016-07-03 (×3): 5 mg via ORAL
  Filled 2016-06-30 (×3): qty 1

## 2016-06-30 MED ORDER — ACETAMINOPHEN 650 MG RE SUPP: 650.0000 mg | Freq: Four times a day (QID) | RECTAL | Status: DC | PRN

## 2016-06-30 MED ORDER — ONDANSETRON HCL 4 MG/2ML IJ SOLN
4.0000 mg | Freq: Once | INTRAMUSCULAR | Status: AC | PRN
  Administered 2016-06-30: 4 mg via INTRAVENOUS
  Filled 2016-06-30: qty 2

## 2016-06-30 NOTE — ED Notes (Signed)
RN getting labs at this moment

## 2016-06-30 NOTE — ED Notes (Signed)
Family at bedside. 

## 2016-06-30 NOTE — ED Provider Notes (Signed)
[facility] DEPT Provider Note   CSN: [id number] Arrival date & time: 06/30/16  1359     History   Chief Complaint Chief Complaint  Patient presents with  . Hip Pain    HPI Ormand Swango is a 80 y.o. male.  Patient with history of Parkinson's disease, bladder cancer s/p transurethral resection, recent admission secondary to sepsis, discharged on 06/17/2016 to skilled nursing facility ([facility]), L total knee replacement in past by Dr. Mayer Camel -- presents with confirmed left intertrochanteric hip fracture after a fall last night. Patient apparently tried to transfer from bed to the toilet without assistance and fell. He was assisted back into bed. Patient had x-rays performed this morning due to persistent pain and inability to ambulate. This showed hip fracture. Patient was sent to the emergency department for management. Patient's pain is currently controlled unless he is moving. He denies other complaints. He has a history of dementia with intermittent confusion but is conversant at baseline.  Prior to being admitted for sepsis, patient could stand, ambulate slowly with a shuffling gait. He has declined since his illness and has been unable to walk. He is in SNF currently for this.       Past Medical History:  Diagnosis Date  . Arthritis   . Bladder cancer ([place])    infiltrative high grade papillary urothelial carcinoma   . Dyslipidemia   . ED (erectile dysfunction)   . Hypertension   . Palsy ([place])    super nuclear palsy managed by [facility]    Patient Active Problem List   Diagnosis Date Noted  . Dehydration 06/13/2016  . Weakness generalized 06/13/2016  . Elevated troponin 06/13/2016  . ARF (acute renal failure) ([place]) 06/13/2016  . Sepsis ([place]) 06/13/2016  . Malignant neoplasm of prostate ([place]) 03/18/2016  . Bladder cancer ([place]) 01/21/2016  . Appendicular ataxia 09/24/2015  . Pruritus 09/24/2015  . Hereditary and idiopathic peripheral neuropathy 01/14/2015  . Gait  instability 10/13/2014  . Hyperlipidemia with target LDL less than 100 08/06/2013  . Essential hypertension, benign 09/17/2012    Past Surgical History:  Procedure Laterality Date  . APPENDECTOMY    . BACK SURGERY    . COLONOSCOPY  2007   Dr.kaplan  . CYSTOSCOPY N/A 03/22/2016   Procedure: CYSTOSCOPY;  Surgeon: Raynelle Bring, MD;  Location: [facility];  Service: Urology;  Laterality: N/A;  . CYSTOSCOPY W/ RETROGRADES N/A 01/21/2016   Procedure: CYSTOSCOPY WITH TURBT WITH  CLOT EVACUATION;  Surgeon: Raynelle Bring, MD;  Location: [facility];  Service: Urology;  Laterality: N/A;  . left total knee  2011  . PROSTATE BIOPSY N/A 03/22/2016   Procedure: BIOPSY TRANSRECTAL ULTRASONIC PROSTATE (TUBP);  Surgeon: Raynelle Bring, MD;  [facility];  Service: Urology;  Laterality: N/A;  . TRANSURETHRAL RESECTION OF BLADDER TUMOR N/A 03/22/2016   Procedure: TRANSURETHRAL RESECTION OF BLADDER TUMOR (TURBT);  Surgeon: Raynelle Bring, MD;  Location[facility];  Service: Urology;  Laterality: N/A;       Home Medications    Prior to Admission medications   Medication Sig Start Date End Date Taking? Authorizing Provider  amoxicillin-clavulanate (AUGMENTIN) 875-125 MG tablet Take 1 tablet by mouth every 12 (twelve) hours. 06/17/16   Donne Hazel, MD  benzonatate (TESSALON) 100 MG capsule Take 1 capsule (100 mg total) by mouth 3 (three) times daily as needed for cough. 06/17/16   Donne Hazel, MD  carbidopa-levodopa (SINEMET IR) 25-100 MG tablet TAKE 1/2 TABLET BY MOUTH TWICE DAILY FOR 3 DAYS, 1/2  TABLET THREE TIMES DAILY FOR 3 DAYS, THEN 1 TABLET THREE TIMES DAILY 03/09/16   Pieter Partridge, DO  donepezil (ARICEPT) 5 MG tablet Take 1 tablet (5 mg total) by mouth at bedtime. 04/15/16   Pieter Partridge, DO  HYDROcodone-acetaminophen (NORCO/VICODIN) 5-325 MG tablet Take 1-2 tablets by mouth every 6 (six) hours as needed. Patient taking differently: Take 1-2 tablets by mouth every 6 (six) hours as needed. One to two, po every  4-6 hours prn pain. Qty 60. No refills. Handwritten script provided to patient by Dr. Tammi Klippel on 06/10/16. 06/10/16   Tyler Pita, MD  lisinopril-hydrochlorothiazide (PRINZIDE,ZESTORETIC) 10-12.5 MG tablet Take 1 tablet by mouth daily. 05/28/15   Denita Lung, MD  loperamide (IMODIUM) 2 MG capsule Take 1 capsule (2 mg total) by mouth as needed for diarrhea or loose stools. 06/17/16   Donne Hazel, MD  phenazopyridine (PYRIDIUM) 100 MG tablet Take 1 tablet (100 mg total) by mouth 3 (three) times daily as needed for pain (for burning). Patient not taking: Reported on 06/13/2016 03/22/16   Raynelle Bring, MD  saccharomyces boulardii (FLORASTOR) 250 MG capsule Take 1 capsule (250 mg total) by mouth 2 (two) times daily. 06/17/16   Donne Hazel, MD  simvastatin (ZOCOR) 20 MG tablet TAKE 1 TABLET BY MOUTH DAILY 05/27/16   Denita Lung, MD    Family History Family History  Problem Relation Age of Onset  . Heart failure Mother   . Hypertension Mother   . Heart failure Father   . Hypertension Father   . Renal Disease Sister   . Diabetes Brother     Social History Social History  Substance Use Topics  . Smoking status: Former Smoker    Packs/day: 0.25    Years: 2.00    Types: Cigarettes    Quit date: 07/12/1967  . Smokeless tobacco: Never Used  . Alcohol use 0.0 oz/week     Comment: occasional beer or wine      Allergies   Patient has no known allergies.   Review of Systems Review of Systems  Constitutional: Negative for fever.  HENT: Negative for rhinorrhea and sore throat.   Eyes: Negative for redness.  Respiratory: Negative for cough.   Cardiovascular: Negative for chest pain.  Gastrointestinal: Negative for abdominal pain, diarrhea, nausea and vomiting.  Genitourinary: Negative for dysuria.  Musculoskeletal: Positive for arthralgias. Negative for myalgias.  Skin: Positive for wound. Negative for rash.  Neurological: Negative for headaches.     Physical Exam Updated  Vital Signs BP 124/66 (BP Location: Right Arm)   Pulse 110   Temp 98.4 F (36.9 C) (Oral)   Resp 18   SpO2 96%   Physical Exam  Constitutional: He appears well-developed and well-nourished.  HENT:  Head: Normocephalic and atraumatic.  Eyes: Conjunctivae are normal. Right eye exhibits no discharge. Left eye exhibits no discharge.  Neck: Normal range of motion. Neck supple.  Cardiovascular: Regular rhythm and normal heart sounds.  Tachycardia present.   Pulmonary/Chest: Effort normal and breath sounds normal.  Abdominal: Soft. There is no tenderness.  Musculoskeletal:       Right shoulder: Normal.       Left shoulder: Normal.       Right elbow: Normal.      Left elbow: Tenderness found.       Left hip: He exhibits decreased range of motion, decreased strength and tenderness.       Left knee: Normal.  Left ankle: Normal.       Lumbar back: Normal.       Arms:      Left upper leg: Normal.  Neurological: He is alert.  Skin: Skin is warm and dry.  Psychiatric: He has a normal mood and affect.  Nursing note and vitals reviewed.    ED Treatments / Results  Labs (all labs ordered are listed, but only abnormal results are displayed) Labs Reviewed  BASIC METABOLIC PANEL - Abnormal; Notable for the following:       Result Value   Glucose, Bld 113 (*)    BUN 26 (*)    Creatinine, Ser 1.74 (*)    GFR calc non Af Amer 36 (*)    GFR calc Af Amer 41 (*)    All other components within normal limits  CBC WITH DIFFERENTIAL/PLATELET - Abnormal; Notable for the following:    RBC 3.23 (*)    Hemoglobin 9.9 (*)    HCT 29.0 (*)    RDW 17.0 (*)    Lymphs Abs 0.4 (*)    All other components within normal limits  PROTIME-INR  TYPE AND SCREEN  ABO/RH    EKG  EKG Interpretation  Date/Time:  Thursday June 30 2016 14:19:10 EST Ventricular Rate:  103 PR Interval:    QRS Duration: 86 QT Interval:  332 QTC Calculation: 435 R Axis:   55 Text Interpretation:  Sinus  tachycardia Probable anteroseptal infarct, old No significant change since last tracing Confirmed by [email or web address]  MD, [place] [phone]) on 06/30/2016 2:21:50 PM       Radiology Dg Chest 1 View  Result Date: 06/30/2016 CLINICAL DATA:  Pain following fall EXAM: CHEST 1 VIEW COMPARISON:  June 17, 2016 FINDINGS: There is no edema or consolidation. Heart size and pulmonary vascularity are normal. No adenopathy. No bone lesions. No pneumothorax. IMPRESSION: No edema or consolidation. Electronically Signed   By: Lowella Grip III M.D.   On: 06/30/2016 14:41   Dg Hip Unilat With Pelvis 2-3 Views Left  Result Date: 06/30/2016 CLINICAL DATA:  Fall, diffuse left hip pain EXAM: DG HIP (WITH OR WITHOUT PELVIS) 2-3V LEFT COMPARISON:  None available FINDINGS: Bones are osteopenic. There is an acute displaced and angulated left hip intertrochanteric fracture. No associated hip dislocation. Bony pelvis appears intact. Degenerative changes noted of the spine and SI joints. Right hip unremarkable. IMPRESSION: Acute displaced and angulated left hip intertrochanteric fracture. Degenerative changes and osteopenia Electronically Signed   By: Jerilynn Mages.  Shick M.D.   On: 06/30/2016 14:42    Procedures Procedures (including critical care time)  Medications Ordered in ED Medications  morphine 4 MG/ML injection 4 mg (not administered)  ondansetron (ZOFRAN) injection 4 mg (not administered)     Initial Impression / Assessment and Plan / ED Course  I have reviewed the triage vital signs and the nursing notes.  Pertinent labs & imaging results that were available during my care of the patient were reviewed by me and considered in my medical decision making (see chart for details).  Clinical Course    Patient seen and examined. Work-up initiated. Medications ordered. Discussed with Dr. Rogene Houston.   Vital signs reviewed and are as follows: BP 124/66 (BP Location: Right Arm)   Pulse 110   Temp 98.4 F (36.9 C)  (Oral)   Resp 18   SpO2 96%   Discussed case with Dr. Tamera Punt who accepts patient. Requests x-rays to knee. Requests transfer to [facility] for possible surgery tomorrow. Family updated.  Patient appears comfortable. Will speak with hospitalist regarding admission.   4:08 PM Spoke with Dr. Aggie Moats who will see. Dr. Marily Memos is on at [facility]. Bed request for [facility].   Final Clinical Impressions(s) / ED Diagnoses   Final diagnoses:  Closed fracture of left hip, initial encounter ([place])   Admit.  New Prescriptions New Prescriptions   No medications on file     Carlisle Cater, PA-C 06/30/16 [place], MD 06/30/16 1626

## 2016-06-30 NOTE — ED Notes (Addendum)
[facility] called for transport. 

## 2016-06-30 NOTE — ED Notes (Signed)
Patient transported to X-ray 

## 2016-06-30 NOTE — ED Notes (Signed)
Bed: [id number] Expected date:  Expected time:  Means of arrival:  Comments: m fall, hip fracture

## 2016-06-30 NOTE — H&P (Signed)
[facility] History and Physical  Barth Repp [id number] DOB: 08/02/1935 DOA: 06/30/2016  Referring physician:  PCP: Wyatt Haste, MD   Chief Complaint: "I was trying to get something and I fell."  HPI: Brian Montgomery is a 80 y.o. male  with past mental history negative for bladder cancer, hypertension and dementia presents to the emergency room after fall. Patient states that over last few days he has been well and family verifies this. Patient states that earlier he tried to stand from a chair to pick up something he dropped and he tipped over. He states when this happened he broke his hip. Patient denies any hitting of his head. Patient denies chest pain shortness breath. Patient denies fevers and chills.  Emergency room: Patient was given morphine and Zofran for his symptoms of pain and nausea. [facility] was consult it and they asked the patient be transferred to [facility] for surgery. X-ray confirmed fracture.   Review of Systems:  As per HPI otherwise 10 point review of systems negative.    Past Medical History:  Diagnosis Date  . Arthritis   . Bladder cancer ([place])    infiltrative high grade papillary urothelial carcinoma   . Dyslipidemia   . ED (erectile dysfunction)   . Hypertension   . Palsy ([place])    super nuclear palsy managed by [name]   Past Surgical History:  Procedure Laterality Date  . APPENDECTOMY    . BACK SURGERY    . COLONOSCOPY  2007   Dr.kaplan  . CYSTOSCOPY N/A 03/22/2016   Procedure: CYSTOSCOPY;  Surgeon: Raynelle Bring, MD;  Location[facility];  Service: Urology;  Laterality: N/A;  . CYSTOSCOPY W/ RETROGRADES N/A 01/21/2016   Procedure: CYSTOSCOPY WITH TURBT WITH  CLOT EVACUATION;  Surgeon: Raynelle Bring, MD;  [facility];  Service: Urology;  Laterality: N/A;  . left total knee  2011  . PROSTATE BIOPSY N/A 03/22/2016   Procedure: BIOPSY TRANSRECTAL ULTRASONIC PROSTATE (TUBP);  Surgeon: Raynelle Bring, MD;  [facility];   Service: Urology;  Laterality: N/A;  . TRANSURETHRAL RESECTION OF BLADDER TUMOR N/A 03/22/2016   Procedure: TRANSURETHRAL RESECTION OF BLADDER TUMOR (TURBT);  Surgeon: Raynelle Bring, MD;  [facility];  Service: Urology;  Laterality: N/A;   Social History:  reports that he quit smoking about 49 years ago. His smoking use included Cigarettes. He has a 0.50 pack-year smoking history. He has never used smokeless tobacco. He reports that he drinks alcohol. He reports that he does not use drugs.  No Known Allergies  Family History  Problem Relation Age of Onset  . Heart failure Mother   . Hypertension Mother   . Heart failure Father   . Hypertension Father   . Renal Disease Sister   . Diabetes Brother      Prior to Admission medications   Medication Sig Start Date End Date Taking? Authorizing Provider  acetaminophen (TYLENOL) 325 MG tablet Take 650 mg by mouth every 6 (six) hours as needed for moderate pain.   Yes Historical Provider, MD  benzonatate (TESSALON) 100 MG capsule Take 1 capsule (100 mg total) by mouth 3 (three) times daily as needed for cough. 06/17/16  Yes Donne Hazel, MD  carbidopa-levodopa (SINEMET IR) 25-100 MG tablet TAKE 1/2 TABLET BY MOUTH TWICE DAILY FOR 3 DAYS, 1/2 TABLET THREE TIMES DAILY FOR 3 DAYS, THEN 1 TABLET THREE TIMES DAILY Patient taking differently: TAKE 1 TABLET BY MOUTH Three times DAILY 03/09/16  Yes Pieter Partridge,  DO  donepezil (ARICEPT) 5 MG tablet Take 1 tablet (5 mg total) by mouth at bedtime. Patient taking differently: Take 5 mg by mouth daily.  04/15/16  Yes Pieter Partridge, DO  HYDROcodone-acetaminophen (NORCO/VICODIN) 5-325 MG tablet Take 1-2 tablets by mouth every 6 (six) hours as needed. Patient taking differently: Take 1-2 tablets by mouth every 6 (six) hours as needed for moderate pain.  06/10/16  Yes Tyler Pita, MD  lisinopril-hydrochlorothiazide (PRINZIDE,ZESTORETIC) 10-12.5 MG tablet Take 1 tablet by mouth daily. 05/28/15  Yes Denita Lung, MD  loperamide (IMODIUM) 2 MG capsule Take 1 capsule (2 mg total) by mouth as needed for diarrhea or loose stools. Patient taking differently: Take 2 mg by mouth 3 (three) times daily as needed for diarrhea or loose stools.  06/17/16  Yes Donne Hazel, MD  phenazopyridine (PYRIDIUM) 100 MG tablet Take 1 tablet (100 mg total) by mouth 3 (three) times daily as needed for pain (for burning). 03/22/16  Yes Raynelle Bring, MD  saccharomyces boulardii (FLORASTOR) 250 MG capsule Take 1 capsule (250 mg total) by mouth 2 (two) times daily. 06/17/16  Yes Donne Hazel, MD  simvastatin (ZOCOR) 20 MG tablet TAKE 1 TABLET BY MOUTH DAILY Patient taking differently: TAKE 20mg  TABLET BY MOUTH in the evening at 6pm 05/27/16  Yes Denita Lung, MD   Physical Exam: Vitals:   06/30/16 1411 06/30/16 1558  BP: 124/66 107/64  Pulse: 110 103  Resp: 18 22  Temp: 98.4 F (36.9 C)   TempSrc: Oral   SpO2: 96% 97%    Wt Readings from Last 3 Encounters:  06/16/16 78.6 kg (173 lb 4.5 oz)  06/10/16 76.9 kg (169 lb 9.6 oz)  06/01/16 77.7 kg (171 lb 3.2 oz)    General:  Appears calm and comfortable, Alert and oriented to person and place Eyes:  PERRL, EOMI, normal lids, iris ENT:  grossly normal hearing, lips & tongue Neck:  no LAD, masses or thyromegaly Cardiovascular:  RRR, no m/r/g. No LE edema.  Respiratory:  CTA bilaterally, no w/r/r. Normal respiratory effort. Abdomen:  soft, ntnd Skin:  no rash or induration seen on limited exam Musculoskeletal:  grossly normal tone BUE, left lower extremity shorter than right lower extremity. Pain with manipulation of left hip. Psychiatric:  grossly normal mood and affect, speech fluent and appropriate Neurologic:  CN 2-12 grossly intact, moves all extremities in coordinated fashion.          Labs on Admission:  Basic Metabolic Panel:  Recent Labs Lab 06/30/16 1441  NA 137  K 3.9  CL 103  CO2 24  GLUCOSE 113*  BUN 26*  CREATININE 1.74*  CALCIUM  9.0   Liver Function Tests: No results for input(s): AST, ALT, ALKPHOS, BILITOT, PROT, ALBUMIN in the last 168 hours. No results for input(s): LIPASE, AMYLASE in the last 168 hours. No results for input(s): AMMONIA in the last 168 hours. CBC:  Recent Labs Lab 06/30/16 1441  WBC 6.5  NEUTROABS 5.2  HGB 9.9*  HCT 29.0*  MCV 89.8  PLT 173   Cardiac Enzymes: No results for input(s): CKTOTAL, CKMB, CKMBINDEX, TROPONINI in the last 168 hours.  BNP (last 3 results) No results for input(s): BNP in the last 8760 hours.  ProBNP (last 3 results) No results for input(s): PROBNP in the last 8760 hours.   Serum creatinine: 1.74 mg/dL High 06/30/16 1441 Estimated creatinine clearance: 33.2 mL/min  CBG: No results for input(s): GLUCAP in the last 168  hours.  Radiological Exams on Admission: Dg Chest 1 View  Result Date: 06/30/2016 CLINICAL DATA:  Pain following fall EXAM: CHEST 1 VIEW COMPARISON:  June 17, 2016 FINDINGS: There is no edema or consolidation. Heart size and pulmonary vascularity are normal. No adenopathy. No bone lesions. No pneumothorax. IMPRESSION: No edema or consolidation. Electronically Signed   By: Lowella Grip III M.D.   On: 06/30/2016 14:41   Dg Hip Unilat With Pelvis 2-3 Views Left  Result Date: 06/30/2016 CLINICAL DATA:  Fall, diffuse left hip pain EXAM: DG HIP (WITH OR WITHOUT PELVIS) 2-3V LEFT COMPARISON:  None available FINDINGS: Bones are osteopenic. There is an acute displaced and angulated left hip intertrochanteric fracture. No associated hip dislocation. Bony pelvis appears intact. Degenerative changes noted of the spine and SI joints. Right hip unremarkable. IMPRESSION: Acute displaced and angulated left hip intertrochanteric fracture. Degenerative changes and osteopenia Electronically Signed   By: Jerilynn Mages.  Shick M.D.   On: 06/30/2016 14:42    EKG: Independently reviewed. Ventricular rate 103 PR interval 120, QRS duration 86 QTC 435; sinus  tachycardia-06/24/2016 EKG compared shows no significant ST changes   Assessment/Plan Principal Problem:   Hip fracture (HCC) Active Problems:   Essential hypertension, benign   Hyperlipidemia with target LDL less than 100   Bladder cancer (HCC)  Hip fracture Morphine when necessary Nothing by mouth after midnight Preop chest x-ray Patient be transferred to [facility] to have surgery by Dr. Tamera Punt ([place]) tomorrow.  Fall Patient's fall was unwitnessed and he is elderly so we'll check head CT. I feel this is further warranted and that no physician that has seen him today will see him tomorrow. Patient is also a horrible historian.   Hypertension When necessary hydralazine 10 mg IV as needed for severe blood pressure Continue lisinopril and hydrochlorothiazide combo pill  Pneumonia Resolved based on ED x-ray  Bladder cancer Urology follow-up after discharge  Hyperlipidemia Continue statin  CKD3 Appears to be new since his hospitalization earlier in the month. It is stable. Ultrasound done on last admission. Patient improving with fluids. Will hydrate and monitor. Monitor Cr daily Cr at baseline currently,  1.74 vs 1.4  Dementia/Parkinson's Continue Aricept & minute  Code Status: Full code  DVT Prophylaxis: Lovenox Family Communication: Wife and daughter at bedside Disposition Plan: Pending Improvement  Status: Inpatient MedSurg  Elwin Mocha, MD Family Medicine [facility] [email or web address] Password [email or web address]

## 2016-06-30 NOTE — ED Provider Notes (Signed)
Medical screening examination/treatment/procedure(s) were conducted as a shared visit with non-physician practitioner(s) and myself.  I personally evaluated the patient during the encounter.   EKG Interpretation  Date/Time:  Thursday June 30 2016 14:19:10 EST Ventricular Rate:  103 PR Interval:    QRS Duration: 86 QT Interval:  332 QTC Calculation: 435 R Axis:   55 Text Interpretation:  Sinus tachycardia Probable anteroseptal infarct, old No significant change since last tracing Confirmed by Miyeko Mahlum  MD, [place] [phone]) on 06/30/2016 2:21:50 PM      Results for orders placed or performed during the hospital encounter of 06/13/16  Blood culture (routine x 2)  Result Value Ref Range   Specimen Description BLOOD LEFT ANTECUBITAL    Special Requests BOTTLES DRAWN AEROBIC AND ANAEROBIC 5CC    Culture      NO GROWTH 5 DAYS Performed at [facility]    Report Status 06/18/2016 FINAL   Blood culture (routine x 2)  Result Value Ref Range   Specimen Description      BLOOD RIGHT ANTECUBITAL Performed at [place] Performed at [place]    Culture      NO GROWTH 5 DAYS Performed at [facility]    Report Status 06/18/2016 FINAL   Urine culture  Result Value Ref Range   Specimen Description URINE, CLEAN CATCH    Special Requests NONE    Culture NO GROWTH Performed at [facility]     Report Status 06/15/2016 FINAL   MRSA PCR Screening  Result Value Ref Range   MRSA by PCR NEGATIVE NEGATIVE  Urinalysis, Routine w reflex microscopic  Result Value Ref Range   Color, Urine AMBER (A) YELLOW   APPearance TURBID (A) CLEAR   Specific Gravity, Urine 1.020 1.005 - 1.030   pH 5.0 5.0 - 8.0   Glucose, UA NEGATIVE NEGATIVE mg/dL   Hgb urine dipstick MODERATE (A) NEGATIVE   Bilirubin Urine SMALL (A) NEGATIVE   Ketones, ur NEGATIVE NEGATIVE mg/dL    Protein, ur 100 (A) NEGATIVE mg/dL   Nitrite NEGATIVE NEGATIVE   Leukocytes, UA SMALL (A) NEGATIVE  CBC with Differential  Result Value Ref Range   WBC 8.1 4.0 - 10.5 K/uL   RBC 4.08 (L) 4.22 - 5.81 MIL/uL   Hemoglobin 12.3 (L) 13.0 - 17.0 g/dL   HCT 36.2 (L) 39.0 - 52.0 %   MCV 88.7 78.0 - 100.0 fL   MCH 30.1 26.0 - 34.0 pg   MCHC 34.0 30.0 - 36.0 g/dL   RDW 16.2 (H) 11.5 - 15.5 %   Platelets 174 150 - 400 K/uL   Neutrophils Relative % 85 %   Neutro Abs 6.9 1.7 - 7.7 K/uL   Lymphocytes Relative 7 %   Lymphs Abs 0.5 (L) 0.7 - 4.0 K/uL   Monocytes Relative 8 %   Monocytes Absolute 0.6 0.1 - 1.0 K/uL   Eosinophils Relative 0 %   Eosinophils Absolute 0.0 0.0 - 0.7 K/uL   Basophils Relative 0 %   Basophils Absolute 0.0 0.0 - 0.1 K/uL  Comprehensive metabolic panel  Result Value Ref Range   Sodium 139 135 - 145 mmol/L   Potassium 3.7 3.5 - 5.1 mmol/L   Chloride 104 101 - 111 mmol/L   CO2 23 22 - 32 mmol/L   Glucose, Bld 129 (H) 65 - 99 mg/dL   BUN 43 (H)  6 - 20 mg/dL   Creatinine, Ser 2.17 (H) 0.61 - 1.24 mg/dL   Calcium 9.3 8.9 - 10.3 mg/dL   Total Protein 8.6 (H) 6.5 - 8.1 g/dL   Albumin 3.8 3.5 - 5.0 g/dL   AST 57 (H) 15 - 41 U/L   ALT 24 17 - 63 U/L   Alkaline Phosphatase 77 38 - 126 U/L   Total Bilirubin 1.2 0.3 - 1.2 mg/dL   GFR calc non Af Amer 27 (L) >60 mL/min   GFR calc Af Amer 32 (L) >60 mL/min   Anion gap 12 5 - 15  Ammonia  Result Value Ref Range   Ammonia 9 9 - 35 umol/L  Troponin I  Result Value Ref Range   Troponin I 0.03 (HH) <0.03 ng/mL  Urine microscopic-add on  Result Value Ref Range   Squamous Epithelial / LPF 0-5 (A) NONE SEEN   WBC, UA 6-30 0 - 5 WBC/hpf   RBC / HPF 6-30 0 - 5 RBC/hpf   Bacteria, UA MANY (A) NONE SEEN  Lactic acid, plasma  Result Value Ref Range   Lactic Acid, Venous 1.3 0.5 - 1.9 mmol/L  Lactic acid, plasma  Result Value Ref Range   Lactic Acid, Venous 1.2 0.5 - 1.9 mmol/L  Magnesium  Result Value Ref Range   Magnesium  1.7 1.7 - 2.4 mg/dL  TSH  Result Value Ref Range   TSH 0.856 0.350 - 4.500 uIU/mL  Troponin I  Result Value Ref Range   Troponin I 0.03 (HH) <0.03 ng/mL  Troponin I  Result Value Ref Range   Troponin I 0.04 (HH) <0.03 ng/mL  Troponin I  Result Value Ref Range   Troponin I 0.03 (HH) <0.03 ng/mL  Comprehensive metabolic panel  Result Value Ref Range   Sodium 138 135 - 145 mmol/L   Potassium 3.4 (L) 3.5 - 5.1 mmol/L   Chloride 109 101 - 111 mmol/L   CO2 19 (L) 22 - 32 mmol/L   Glucose, Bld 104 (H) 65 - 99 mg/dL   BUN 41 (H) 6 - 20 mg/dL   Creatinine, Ser 2.06 (H) 0.61 - 1.24 mg/dL   Calcium 8.3 (L) 8.9 - 10.3 mg/dL   Total Protein 7.0 6.5 - 8.1 g/dL   Albumin 3.1 (L) 3.5 - 5.0 g/dL   AST 68 (H) 15 - 41 U/L   ALT 13 (L) 17 - 63 U/L   Alkaline Phosphatase 66 38 - 126 U/L   Total Bilirubin 1.0 0.3 - 1.2 mg/dL   GFR calc non Af Amer 29 (L) >60 mL/min   GFR calc Af Amer 34 (L) >60 mL/min   Anion gap 10 5 - 15  CBC  Result Value Ref Range   WBC 6.8 4.0 - 10.5 K/uL   RBC 3.61 (L) 4.22 - 5.81 MIL/uL   Hemoglobin 10.8 (L) 13.0 - 17.0 g/dL   HCT 31.9 (L) 39.0 - 52.0 %   MCV 88.4 78.0 - 100.0 fL   MCH 29.9 26.0 - 34.0 pg   MCHC 33.9 30.0 - 36.0 g/dL   RDW 16.3 (H) 11.5 - 15.5 %   Platelets 163 150 - 400 K/uL  Protime-INR  Result Value Ref Range   Prothrombin Time 14.6 11.4 - 15.2 seconds   INR 1.14   Sodium, urine, random  Result Value Ref Range   Sodium, Ur 95 mmol/L  Creatinine, urine, random  Result Value Ref Range   Creatinine, Urine 101.66 mg/dL  Procalcitonin  Result  Value Ref Range   Procalcitonin 3.23 ng/mL  APTT  Result Value Ref Range   aPTT 42 (H) 24 - 36 seconds  Lactic acid, plasma  Result Value Ref Range   Lactic Acid, Venous 1.1 0.5 - 1.9 mmol/L  Glucose, capillary  Result Value Ref Range   Glucose-Capillary 91 65 - 99 mg/dL  Magnesium  Result Value Ref Range   Magnesium 2.2 1.7 - 2.4 mg/dL  CBC  Result Value Ref Range   WBC 4.9 4.0 - 10.5 K/uL    RBC 3.26 (L) 4.22 - 5.81 MIL/uL   Hemoglobin 9.9 (L) 13.0 - 17.0 g/dL   HCT 28.3 (L) 39.0 - 52.0 %   MCV 86.8 78.0 - 100.0 fL   MCH 30.4 26.0 - 34.0 pg   MCHC 35.0 30.0 - 36.0 g/dL   RDW 16.2 (H) 11.5 - 15.5 %   Platelets 145 (L) 150 - 400 K/uL  Basic metabolic panel  Result Value Ref Range   Sodium 135 135 - 145 mmol/L   Potassium 3.5 3.5 - 5.1 mmol/L   Chloride 109 101 - 111 mmol/L   CO2 20 (L) 22 - 32 mmol/L   Glucose, Bld 100 (H) 65 - 99 mg/dL   BUN 32 (H) 6 - 20 mg/dL   Creatinine, Ser 1.75 (H) 0.61 - 1.24 mg/dL   Calcium 8.0 (L) 8.9 - 10.3 mg/dL   GFR calc non Af Amer 36 (L) >60 mL/min   GFR calc Af Amer 41 (L) >60 mL/min   Anion gap 6 5 - 15  Glucose, capillary  Result Value Ref Range   Glucose-Capillary 102 (H) 65 - 99 mg/dL  Basic metabolic panel  Result Value Ref Range   Sodium 138 135 - 145 mmol/L   Potassium 3.4 (L) 3.5 - 5.1 mmol/L   Chloride 110 101 - 111 mmol/L   CO2 22 22 - 32 mmol/L   Glucose, Bld 96 65 - 99 mg/dL   BUN 26 (H) 6 - 20 mg/dL   Creatinine, Ser 1.62 (H) 0.61 - 1.24 mg/dL   Calcium 8.1 (L) 8.9 - 10.3 mg/dL   GFR calc non Af Amer 39 (L) >60 mL/min   GFR calc Af Amer 45 (L) >60 mL/min   Anion gap 6 5 - 15  Glucose, capillary  Result Value Ref Range   Glucose-Capillary 86 65 - 99 mg/dL  Basic metabolic panel  Result Value Ref Range   Sodium 139 135 - 145 mmol/L   Potassium 3.4 (L) 3.5 - 5.1 mmol/L   Chloride 111 101 - 111 mmol/L   CO2 22 22 - 32 mmol/L   Glucose, Bld 95 65 - 99 mg/dL   BUN 22 (H) 6 - 20 mg/dL   Creatinine, Ser 1.43 (H) 0.61 - 1.24 mg/dL   Calcium 8.2 (L) 8.9 - 10.3 mg/dL   GFR calc non Af Amer 45 (L) >60 mL/min   GFR calc Af Amer 53 (L) >60 mL/min   Anion gap 6 5 - 15  Glucose, capillary  Result Value Ref Range   Glucose-Capillary 86 65 - 99 mg/dL  CBG monitoring, ED  Result Value Ref Range   Glucose-Capillary 119 (H) 65 - 99 mg/dL  I-Stat CG4 Lactic Acid, ED  Result Value Ref Range   Lactic Acid, Venous 2.82  (HH) 0.5 - 1.9 mmol/L   Comment NOTIFIED PHYSICIAN   POC occult blood, ED Provider will collect  Result Value Ref Range   Fecal Occult Bld NEGATIVE NEGATIVE  Echocardiogram  Result Value Ref Range   Weight 2,638.47 oz   Height 64 in   BP 127/63 mmHg   Dg Chest 1 View  Result Date: 06/30/2016 CLINICAL DATA:  Pain following fall EXAM: CHEST 1 VIEW COMPARISON:  June 17, 2016 FINDINGS: There is no edema or consolidation. Heart size and pulmonary vascularity are normal. No adenopathy. No bone lesions. No pneumothorax. IMPRESSION: No edema or consolidation. Electronically Signed   By: Lowella Grip III M.D.   On: 06/30/2016 14:41   X-ray Chest Pa And Lateral  Result Date: 06/14/2016 CLINICAL DATA:  Generalized weakness EXAM: CHEST  2 VIEW COMPARISON:  06/13/2016 FINDINGS: Progression of bibasilar airspace disease since the prior study. Negative for heart failure. Negative for pleural effusion. IMPRESSION: Progression of bibasilar atelectasis/infiltrate. Electronically Signed   By: Franchot Gallo M.D.   On: 06/14/2016 11:51   Dg Chest 2 View  Result Date: 06/13/2016 CLINICAL DATA:  Tachycardia, weakness and dizziness. EXAM: CHEST  2 VIEW COMPARISON:  None. FINDINGS: The heart size and mediastinal contours are within normal limits. Both lungs are clear. The visualized skeletal structures are unremarkable. IMPRESSION: No active cardiopulmonary disease. Electronically Signed   By: Fidela Salisbury M.D.   On: 06/13/2016 14:29   Dg Lumbar Spine Complete  Result Date: 06/13/2016 CLINICAL DATA:  Bilateral lower extremity weakness. Unsteady gait. Altered mental status. Recent radiation treatment for prostate cancer. EXAM: LUMBAR SPINE - COMPLETE 4+ VIEW COMPARISON:  CT abdomen and pelvis 01/29/2016 FINDINGS: There 5 non rib-bearing lumbar type vertebral bodies. Vertebral body heights are preserved without evidence of fracture. Moderate disc space narrowing is present at L4-5 and L5-S1, with mild  disc space narrowing more proximally in the lumbar spine. Vacuum disc is apparent at L2-3, L3-4, and likely L1-2. Mild-to-moderate marginal endplate osteophyte formation is present throughout the lumbar spine. No pars defects are identified. There is mild-to-moderate mid to lower lumbar spine facet arthrosis. Fiducials overlie the prostate. Aortic atherosclerosis is noted. IMPRESSION: 1. No evidence of acute osseous abnormality. 2. Moderate lumbar disc degeneration. 3. Aortic atherosclerosis. Electronically Signed   By: Logan Bores M.D.   On: 06/13/2016 14:31   Ct Head Wo Contrast  Result Date: 06/13/2016 CLINICAL DATA:  Altered mental status.  Bilateral leg weakness EXAM: CT HEAD WITHOUT CONTRAST CT CERVICAL SPINE WITHOUT CONTRAST TECHNIQUE: Multidetector CT imaging of the head and cervical spine was performed following the standard protocol without intravenous contrast. Multiplanar CT image reconstructions of the cervical spine were also generated. COMPARISON:  MRI head 10/27/2014.  MRI cervical spine 09/01/2015 FINDINGS: CT HEAD FINDINGS Brain: Generalized atrophy. Chronic microvascular ischemic changes in the white matter. Negative for acute infarct. Negative for hemorrhage or mass. Vascular: Negative for dense MCA. Skull: Negative for fracture Sinuses/Orbits: With negative Other: None CT CERVICAL SPINE FINDINGS Alignment: 3 mm anterior listhesis at C7-T1. Remaining alignment normal. Straightening of the cervical lordosis Skull base and vertebrae: Negative for fracture. Soft tissues and spinal canal: Atherosclerotic calcification. No soft tissue mass or swelling Disc levels: Disc degeneration and spurring at C3-4, C4-5, C5-6, and C6-7 of a moderate degree. Degenerative anterolisthesis C7-T1 due to advanced facet degeneration. Upper chest: Negative Other: None IMPRESSION: Atrophy and chronic microvascular ischemic change. No acute intracranial abnormality Moderate cervical degenerative change. Negative for  cervical spine fracture. Electronically Signed   By: Franchot Gallo M.D.   On: 06/13/2016 14:06   Ct Cervical Spine Wo Contrast  Result Date: 06/13/2016 CLINICAL DATA:  Altered mental status.  Bilateral leg  weakness EXAM: CT HEAD WITHOUT CONTRAST CT CERVICAL SPINE WITHOUT CONTRAST TECHNIQUE: Multidetector CT imaging of the head and cervical spine was performed following the standard protocol without intravenous contrast. Multiplanar CT image reconstructions of the cervical spine were also generated. COMPARISON:  MRI head 10/27/2014.  MRI cervical spine 09/01/2015 FINDINGS: CT HEAD FINDINGS Brain: Generalized atrophy. Chronic microvascular ischemic changes in the white matter. Negative for acute infarct. Negative for hemorrhage or mass. Vascular: Negative for dense MCA. Skull: Negative for fracture Sinuses/Orbits: With negative Other: None CT CERVICAL SPINE FINDINGS Alignment: 3 mm anterior listhesis at C7-T1. Remaining alignment normal. Straightening of the cervical lordosis Skull base and vertebrae: Negative for fracture. Soft tissues and spinal canal: Atherosclerotic calcification. No soft tissue mass or swelling Disc levels: Disc degeneration and spurring at C3-4, C4-5, C5-6, and C6-7 of a moderate degree. Degenerative anterolisthesis C7-T1 due to advanced facet degeneration. Upper chest: Negative Other: None IMPRESSION: Atrophy and chronic microvascular ischemic change. No acute intracranial abnormality Moderate cervical degenerative change. Negative for cervical spine fracture. Electronically Signed   By: Franchot Gallo M.D.   On: 06/13/2016 14:06   US Renal  Result Date: 06/14/2016 CLINICAL DATA:  Weakness. Increased creatinine. Acute renal insufficiency. Bladder cancer. EXAM: RENAL / URINARY TRACT ULTRASOUND COMPLETE COMPARISON:  None. FINDINGS: Right Kidney: Length: 11.9 cm, within normal limits. Echogenicity within normal limits. No mass or hydronephrosis visualized. Left Kidney: Length: 11.7 cm,  within normal limits. Echogenicity within normal limits. No mass or hydronephrosis visualized. Bladder: Appears normal for degree of bladder distention. IMPRESSION: Negative bilateral renal ultrasound. Electronically Signed   By: San Morelle M.D.   On: 06/14/2016 13:01   Dg Chest Port 1 View  Result Date: 06/17/2016 CLINICAL DATA:  Cough.  History of pneumonia. EXAM: PORTABLE CHEST 1 VIEW COMPARISON:  PA and lateral chest 06/13/2016 and 06/14/2016. FINDINGS: Right basilar airspace disease does not appear notably changed since the most recent exam. Minimal atelectasis is present in the left lung base. No pneumothorax or pleural effusion. Heart size is normal. IMPRESSION: No change in right basilar airspace disease since the most recent exam. Electronically Signed   By: Inge Rise M.D.   On: 06/17/2016 10:51     Patient seen by me along with physician assistant. Patient nursing home patient from [facility]. Had a fall with the portable x-ray there consistent with left hip fracture. X-rays repeated here obvious left intertrochanteric fracture on the x-rays. Also patient clinically with a shortened leg and now rotated out laterally. Patient will require orthopedic consultation and medicine admission.  Patient is alert heart regular rate and rhythm lungs are clear bilaterally. Oxygen saturations are around 96% on room air. Patient nontoxic no acute distress. Patient will follow commands.   Fredia Sorrow, MD 06/30/16 [phone]

## 2016-06-30 NOTE — Consult Note (Signed)
Reason for Consult:L hip fracture Referring Physician: Byrne Capek is an 80 y.o. male.  HPI: S/p fall from wheelchair last night with c/o L hip pain.  Found to have intertroch fx.  H/o parkinson's ds., recent sepsis, has been mostly bed bound for several weeks.  Previously walked with shuffling slow gait. Current pain mild, worse with movement.  Denies other injuries with the fall.  Past Medical History:  Diagnosis Date  . Arthritis   . Bladder cancer ([place])    infiltrative high grade papillary urothelial carcinoma   . Dyslipidemia   . ED (erectile dysfunction)   . Hypertension   . 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 Parkinson disease [facility])     Past Surgical History:  Procedure Laterality Date  . APPENDECTOMY    . BACK SURGERY    . COLONOSCOPY  2007   Dr.kaplan  . CYSTOSCOPY N/A 03/22/2016   Procedure: CYSTOSCOPY;  Surgeon: Raynelle Bring, MD;  Location: [facility];  Service: Urology;  Laterality: N/A;  . CYSTOSCOPY W/ RETROGRADES N/A 01/21/2016   Procedure: CYSTOSCOPY WITH TURBT WITH  CLOT EVACUATION;  Surgeon: Raynelle Bring, MD;  Location: [facility];  Service: Urology;  Laterality: N/A;  . left total knee  2011  . PROSTATE BIOPSY N/A 03/22/2016   Procedure: BIOPSY TRANSRECTAL ULTRASONIC PROSTATE (TUBP);  Surgeon: Raynelle Bring, MD;  Location: [facility];  Service: Urology;  Laterality: N/A;  . TRANSURETHRAL RESECTION OF BLADDER TUMOR N/A 03/22/2016   Procedure: TRANSURETHRAL RESECTION OF BLADDER TUMOR (TURBT);  Surgeon: Raynelle Bring, MD;  Location: [facility];  Service: Urology;  Laterality: N/A;    Family History  Problem Relation Age of Onset  . Heart failure Mother   . Hypertension Mother   . Heart failure Father   . Hypertension Father   . Renal Disease Sister   . Diabetes Brother     Social History:  reports that he quit smoking about 49 years ago. His smoking use included Cigarettes. He has a 0.50 pack-year smoking history. He has never used smokeless  tobacco. He reports that he drinks alcohol. He reports that he does not use drugs.  Allergies: No Known Allergies  Medications: I have reviewed the patient's current medications.  Results for orders placed or performed during the hospital encounter of 06/30/16 (from the past 48 hour(s))  Basic metabolic panel     Status: Abnormal   Collection Time: 06/30/16  2:41 PM  Result Value Ref Range   Sodium 137 135 - 145 mmol/L   Potassium 3.9 3.5 - 5.1 mmol/L   Chloride 103 101 - 111 mmol/L   CO2 24 22 - 32 mmol/L   Glucose, Bld 113 (H) 65 - 99 mg/dL   BUN 26 (H) 6 - 20 mg/dL   Creatinine, Ser 1.74 (H) 0.61 - 1.24 mg/dL   Calcium 9.0 8.9 - 10.3 mg/dL   GFR calc non Af Amer 36 (L) >60 mL/min   GFR calc Af Amer 41 (L) >60 mL/min    Comment: (NOTE) The eGFR has been calculated using the CKD EPI equation. This calculation has not been validated in all clinical situations. eGFR's persistently <60 mL/min signify possible Chronic Kidney Disease.    Anion gap 10 5 - 15  CBC WITH DIFFERENTIAL     Status: Abnormal   Collection Time: 06/30/16  2:41 PM  Result Value Ref Range   WBC 6.5 4.0 - 10.5 K/uL   RBC 3.23 (L) 4.22 -  5.81 MIL/uL   Hemoglobin 9.9 (L) 13.0 - 17.0 g/dL   HCT 29.0 (L) 39.0 - 52.0 %   MCV 89.8 78.0 - 100.0 fL   MCH 30.7 26.0 - 34.0 pg   MCHC 34.1 30.0 - 36.0 g/dL   RDW 17.0 (H) 11.5 - 15.5 %   Platelets 173 150 - 400 K/uL   Neutrophils Relative % 81 %   Neutro Abs 5.2 1.7 - 7.7 K/uL   Lymphocytes Relative 6 %   Lymphs Abs 0.4 (L) 0.7 - 4.0 K/uL   Monocytes Relative 12 %   Monocytes Absolute 0.8 0.1 - 1.0 K/uL   Eosinophils Relative 1 %   Eosinophils Absolute 0.1 0.0 - 0.7 K/uL   Basophils Relative 0 %   Basophils Absolute 0.0 0.0 - 0.1 K/uL  Protime-INR     Status: None   Collection Time: 06/30/16  2:41 PM  Result Value Ref Range   Prothrombin Time 14.1 11.4 - 15.2 seconds   INR 1.08   Type and screen [place]     Status: None   Collection  Time: 06/30/16  2:41 PM  Result Value Ref Range   ABO/RH(D) A POS    Antibody Screen NEG    Sample Expiration 07/03/2016     Dg Chest 1 View  Result Date: 06/30/2016 CLINICAL DATA:  Pain following fall EXAM: CHEST 1 VIEW COMPARISON:  June 17, 2016 FINDINGS: There is no edema or consolidation. Heart size and pulmonary vascularity are normal. No adenopathy. No bone lesions. No pneumothorax. IMPRESSION: No edema or consolidation. Electronically Signed   By: Lowella Grip III M.D.   On: 06/30/2016 14:41   Dg Hip Unilat With Pelvis 2-3 Views Left  Result Date: 06/30/2016 CLINICAL DATA:  Fall, diffuse left hip pain EXAM: DG HIP (WITH OR WITHOUT PELVIS) 2-3V LEFT COMPARISON:  None available FINDINGS: Bones are osteopenic. There is an acute displaced and angulated left hip intertrochanteric fracture. No associated hip dislocation. Bony pelvis appears intact. Degenerative changes noted of the spine and SI joints. Right hip unremarkable. IMPRESSION: Acute displaced and angulated left hip intertrochanteric fracture. Degenerative changes and osteopenia Electronically Signed   By: Jerilynn Mages.  Shick M.D.   On: 06/30/2016 14:42    Review of Systems  Unable to perform ROS: Dementia   Blood pressure 107/64, pulse 103, temperature 98.4 F (36.9 C), temperature source Oral, resp. rate 22, SpO2 97 %. Physical Exam  Constitutional: He appears well-developed.  HENT:  Head: Atraumatic.  Eyes: EOM are normal.  Cardiovascular: Intact distal pulses.   Respiratory: Effort normal.  Musculoskeletal:  LLE shortened and externally rotated.  NVID.  L Hip TTP.  Denies other musculoskeletal tenderness.  Neurological: He is alert.  Skin: Skin is warm and dry.  Psychiatric: He has a normal mood and affect.    Assessment/Plan: L intertroch hip fracture Plan IMN tomorrow for pain control, and maximize chance of ambulating again Risks/ benefits discusses with pt and family Transfer to [facility] for surgery tomorrow.  NPO  p MN  Brian Montgomery WILLIAM 06/30/2016, 5:07 PM

## 2016-06-30 NOTE — ED Triage Notes (Signed)
Per [facility], patient from [facility], per staff patient fell last night while transferring from wheel chair to bed. Per staff, confirmed left hip fracture. XR done at facility. Denies pain at this time.

## 2016-06-30 NOTE — ED Notes (Signed)
ED Provider at bedside. 

## 2016-07-01 ENCOUNTER — Inpatient Hospital Stay (HOSPITAL_COMMUNITY): Payer: Medicare Other | Admitting: Anesthesiology

## 2016-07-01 ENCOUNTER — Encounter (HOSPITAL_COMMUNITY)
Admission: EM | Disposition: A | Discharge status: Skilled Nursing Facility | Payer: Self-pay | Source: Home / Self Care | Attending: Orthopedic Surgery

## 2016-07-01 DIAGNOSIS — S72002A Fracture of unspecified part of neck of left femur, initial encounter for closed fracture: Secondary | ICD-10-CM

## 2016-07-01 DIAGNOSIS — S72142A Displaced intertrochanteric fracture of left femur, initial encounter for closed fracture: Secondary | ICD-10-CM | POA: Diagnosis present

## 2016-07-01 DIAGNOSIS — N183 Chronic kidney disease, stage 3 unspecified: Secondary | ICD-10-CM | POA: Diagnosis present

## 2016-07-01 HISTORY — PX: INTRAMEDULLARY (IM) NAIL INTERTROCHANTERIC: SHX5875

## 2016-07-01 LAB — CREATININE, SERUM
Creatinine, Ser: 1.43 mg/dL — ABNORMAL HIGH (ref 0.61–1.24)
GFR calc Af Amer: 53 mL/min — ABNORMAL LOW (ref >60)
GFR calc non Af Amer: 45 mL/min — ABNORMAL LOW (ref >60)

## 2016-07-01 LAB — CBC
HCT: 29.7 % — ABNORMAL LOW (ref 39.0–52.0)
HEMATOCRIT: 31.6 % — AB (ref 39.0–52.0)
HEMOGLOBIN: 9.7 g/dL — AB (ref 13.0–17.0)
Hemoglobin: 10.3 g/dL — ABNORMAL LOW (ref 13.0–17.0)
MCH: 30.1 pg (ref 26.0–34.0)
MCH: 30 pg (ref 26.0–34.0)
MCHC: 32.7 g/dL (ref 30.0–36.0)
MCHC: 32.6 g/dL (ref 30.0–36.0)
MCV: 92.2 fL (ref 78.0–100.0)
MCV: 92.1 fL (ref 78.0–100.0)
PLATELETS: 141 10*3/uL — AB (ref 150–400)
Platelets: 136 10*3/uL — ABNORMAL LOW (ref 150–400)
RBC: 3.22 MIL/uL — AB (ref 4.22–5.81)
RBC: 3.43 MIL/uL — ABNORMAL LOW (ref 4.22–5.81)
RDW: 17.5 % — ABNORMAL HIGH (ref 11.5–15.5)
RDW: 17.5 % — ABNORMAL HIGH (ref 11.5–15.5)
WBC: 6.5 10*3/uL (ref 4.0–10.5)
WBC: 8.1 10*3/uL (ref 4.0–10.5)

## 2016-07-01 LAB — BASIC METABOLIC PANEL
ANION GAP: 9 (ref 5–15)
BUN: 24 mg/dL — AB (ref 6–20)
CHLORIDE: 102 mmol/L (ref 101–111)
CO2: 27 mmol/L (ref 22–32)
Calcium: 9.4 mg/dL (ref 8.9–10.3)
Creatinine, Ser: 1.78 mg/dL — ABNORMAL HIGH (ref 0.61–1.24)
GFR, EST AFRICAN AMERICAN: 40 mL/min — AB (ref >60)
GFR, EST NON AFRICAN AMERICAN: 35 mL/min — AB (ref >60)
Glucose, Bld: 113 mg/dL — ABNORMAL HIGH (ref 65–99)
POTASSIUM: 4.5 mmol/L (ref 3.5–5.1)
SODIUM: 138 mmol/L (ref 135–145)

## 2016-07-01 LAB — PROTIME-INR
INR: 1.12
PROTHROMBIN TIME: 14.5 s (ref 11.4–15.2)

## 2016-07-01 LAB — SURGICAL PCR SCREEN
MRSA, PCR: NEGATIVE
Staphylococcus aureus: NEGATIVE

## 2016-07-01 LAB — PREPARE RBC (CROSSMATCH)

## 2016-07-01 SURGERY — FIXATION, FRACTURE, INTERTROCHANTERIC, WITH INTRAMEDULLARY ROD
Anesthesia: General | Site: Hip | Laterality: Left

## 2016-07-01 MED ORDER — METOCLOPRAMIDE HCL 5 MG PO TABS: 5.0000 mg | ORAL_TABLET | Freq: Three times a day (TID) | ORAL | Status: DC | PRN

## 2016-07-01 MED ORDER — HYDROMORPHONE HCL 1 MG/ML IJ SOLN
INTRAMUSCULAR | Status: AC
  Filled 2016-07-01: qty 1

## 2016-07-01 MED ORDER — SUGAMMADEX SODIUM 200 MG/2ML IV SOLN
INTRAVENOUS | Status: AC
  Filled 2016-07-01: qty 2

## 2016-07-01 MED ORDER — FENTANYL CITRATE (PF) 100 MCG/2ML IJ SOLN
INTRAMUSCULAR | Status: DC | PRN
  Administered 2016-07-01: 100 ug via INTRAVENOUS
  Administered 2016-07-01 (×2): 50 ug via INTRAVENOUS

## 2016-07-01 MED ORDER — SODIUM CHLORIDE 0.9 % IV SOLN: Freq: Once | INTRAVENOUS | Status: DC

## 2016-07-01 MED ORDER — MENTHOL 3 MG MT LOZG
1.0000 | LOZENGE | OROMUCOSAL | Status: DC | PRN
Start: 2016-07-01 — End: 2016-07-03

## 2016-07-01 MED ORDER — MORPHINE SULFATE (PF) 2 MG/ML IV SOLN
0.5000 mg | INTRAVENOUS | Status: DC | PRN
  Administered 2016-07-01: 0.5 mg via INTRAVENOUS
  Filled 2016-07-01: qty 1

## 2016-07-01 MED ORDER — HYDROCODONE-ACETAMINOPHEN 5-325 MG PO TABS
1.0000 | ORAL_TABLET | Freq: Four times a day (QID) | ORAL | Status: DC | PRN
  Administered 2016-07-01 – 2016-07-03 (×4): 2 via ORAL
  Filled 2016-07-01 (×3): qty 2

## 2016-07-01 MED ORDER — CEFAZOLIN SODIUM-DEXTROSE 2-4 GM/100ML-% IV SOLN
2.0000 g | Freq: Four times a day (QID) | INTRAVENOUS | Status: AC
  Administered 2016-07-01 – 2016-07-02 (×2): 2 g via INTRAVENOUS
  Filled 2016-07-01 (×2): qty 100

## 2016-07-01 MED ORDER — LIDOCAINE HCL (CARDIAC) 20 MG/ML IV SOLN
INTRAVENOUS | Status: DC | PRN
  Administered 2016-07-01: 100 mg via INTRATRACHEAL

## 2016-07-01 MED ORDER — HYDROCODONE-ACETAMINOPHEN 5-325 MG PO TABS
ORAL_TABLET | ORAL | Status: AC
  Filled 2016-07-01: qty 2

## 2016-07-01 MED ORDER — FENTANYL CITRATE (PF) 100 MCG/2ML IJ SOLN
INTRAMUSCULAR | Status: AC
  Filled 2016-07-01: qty 2

## 2016-07-01 MED ORDER — FENTANYL CITRATE (PF) 100 MCG/2ML IJ SOLN
25.0000 ug | INTRAMUSCULAR | Status: DC | PRN
  Administered 2016-07-01: 25 ug via INTRAVENOUS

## 2016-07-01 MED ORDER — CEFAZOLIN SODIUM-DEXTROSE 2-3 GM-% IV SOLR
INTRAVENOUS | Status: DC | PRN
  Administered 2016-07-01: 2 g via INTRAVENOUS

## 2016-07-01 MED ORDER — LACTATED RINGERS IV SOLN
INTRAVENOUS | Status: DC
  Administered 2016-07-01 (×2): via INTRAVENOUS

## 2016-07-01 MED ORDER — DEXTROSE 5 % IV SOLN
INTRAVENOUS | Status: DC | PRN
  Administered 2016-07-01: 65 ug/min via INTRAVENOUS

## 2016-07-01 MED ORDER — PROPOFOL 10 MG/ML IV BOLUS
INTRAVENOUS | Status: DC | PRN
  Administered 2016-07-01: 70 mg via INTRAVENOUS

## 2016-07-01 MED ORDER — METOCLOPRAMIDE HCL 5 MG/ML IJ SOLN: 5.0000 mg | Freq: Three times a day (TID) | INTRAMUSCULAR | Status: DC | PRN

## 2016-07-01 MED ORDER — SUGAMMADEX SODIUM 200 MG/2ML IV SOLN
INTRAVENOUS | Status: DC | PRN
  Administered 2016-07-01: 160 mg via INTRAVENOUS

## 2016-07-01 MED ORDER — FENTANYL CITRATE (PF) 100 MCG/2ML IJ SOLN
INTRAMUSCULAR | Status: AC
  Filled 2016-07-01: qty 4

## 2016-07-01 MED ORDER — ROCURONIUM BROMIDE 100 MG/10ML IV SOLN
INTRAVENOUS | Status: DC | PRN
  Administered 2016-07-01: 10 mg via INTRAVENOUS
  Administered 2016-07-01: 30 mg via INTRAVENOUS

## 2016-07-01 MED ORDER — PHENOL 1.4 % MT LIQD: 1.0000 | OROMUCOSAL | Status: DC | PRN

## 2016-07-01 MED ORDER — ONDANSETRON HCL 4 MG/2ML IJ SOLN
INTRAMUSCULAR | Status: AC
  Filled 2016-07-01: qty 2

## 2016-07-01 MED ORDER — ONDANSETRON HCL 4 MG/2ML IJ SOLN
INTRAMUSCULAR | Status: DC | PRN
  Administered 2016-07-01: 4 mg via INTRAVENOUS

## 2016-07-01 SURGICAL SUPPLY — 34 items
CHLORAPREP W/TINT 26ML (MISCELLANEOUS) ×3 IMPLANT
COVER PERINEAL POST (MISCELLANEOUS) ×3 IMPLANT
COVER SURGICAL LIGHT HANDLE (MISCELLANEOUS) ×3 IMPLANT
DRAPE STERI IOBAN 125X83 (DRAPES) ×3 IMPLANT
DRAPE SURG 17X23 STRL (DRAPES) ×12 IMPLANT
DRSG MEPILEX BORDER 4X4 (GAUZE/BANDAGES/DRESSINGS) ×4 IMPLANT
ELECT REM PT RETURN 9FT ADLT (ELECTROSURGICAL) ×3
ELECTRODE REM PT RTRN 9FT ADLT (ELECTROSURGICAL) ×1 IMPLANT
GLOVE BIO SURGEON STRL SZ7 (GLOVE) ×3 IMPLANT
GLOVE BIO SURGEON STRL SZ7.5 (GLOVE) ×3 IMPLANT
GLOVE BIOGEL PI IND STRL 8 (GLOVE) ×1 IMPLANT
GLOVE BIOGEL PI INDICATOR 8 (GLOVE) ×2
GOWN STRL REUS W/ TWL LRG LVL3 (GOWN DISPOSABLE) ×2 IMPLANT
GOWN STRL REUS W/TWL LRG LVL3 (GOWN DISPOSABLE) ×6
GUIDEPIN VERSANAIL DSP 3.2X444 ×2 IMPLANT
GUIDEWIRE BALL NOSE 100CM (WIRE) ×2 IMPLANT
HIP FRAC NAIL LAG SCR 10.5X100 (Orthopedic Implant) ×2 IMPLANT
HIP FRAC NAIL LEFT 11X360MM (Orthopedic Implant) ×3 IMPLANT
KIT ROOM TURNOVER OR (KITS) ×3 IMPLANT
LINER BOOT UNIVERSAL DISP (MISCELLANEOUS) ×3 IMPLANT
MANIFOLD NEPTUNE II (INSTRUMENTS) ×3 IMPLANT
NAIL HIP FRAC LEFT 11X360MM (Orthopedic Implant) IMPLANT
NS IRRIG 1000ML POUR BTL (IV SOLUTION) ×3 IMPLANT
PACK GENERAL/GYN (CUSTOM PROCEDURE TRAY) ×3 IMPLANT
PAD ARMBOARD 7.5X6 YLW CONV (MISCELLANEOUS) ×6 IMPLANT
SCREW CANN THRD AFF 10.5X100 (Orthopedic Implant) IMPLANT
STAPLER VISISTAT 35W (STAPLE) ×3 IMPLANT
SUT VIC AB 1 CT1 27 (SUTURE) ×3
SUT VIC AB 1 CT1 27XBRD ANBCTR (SUTURE) ×1 IMPLANT
SUT VIC AB 2-0 CT1 27 (SUTURE) ×3
SUT VIC AB 2-0 CT1 TAPERPNT 27 (SUTURE) ×1 IMPLANT
TOWEL OR 17X24 6PK STRL BLUE (TOWEL DISPOSABLE) ×3 IMPLANT
TOWEL OR 17X26 10 PK STRL BLUE (TOWEL DISPOSABLE) ×3 IMPLANT
WATER STERILE IRR 1000ML POUR (IV SOLUTION) ×3 IMPLANT

## 2016-07-01 NOTE — Op Note (Signed)
Procedure(s): INTRAMEDULLARY (IM) NAIL INTERTROCHANTERIC Procedure Note  Brian Montgomery male 80 y.o. 07/01/2016  Procedure(s) and Anesthesia Type:    * LEFT INTRAMEDULLARY (IM) NAIL INTERTROCHANTERIC - General  Surgeon(s) and Role:    * Tania Ade, MD - Primary   Indications:  80 y.o. male s/p fall with left hip fracture. Indicated for surgery to promote early ambulation, pain control and prevent complications of bed rest.     Surgeon: Nita Sells   Assistants: none  Anesthesia: General endotracheal anesthesia    Procedure Detail  INTRAMEDULLARY (IM) NAIL INTERTROCHANTERIC  Findings: Biomet affixus nail 11x 360  Estimated Blood Loss:  200 mL         Drains: none  Blood Given: none          Specimens: none        Complications:  * No complications entered in OR log *         Disposition: PACU - hemodynamically stable.         Condition: stable    Procedure:  The patient was identified in the preoperative holding area  where I personally marked the operative site after verifying site, side,  and procedure with the patient. She was taken back to the operating  room where general anesthesia was induced without complication. She was  placed on the fracture table with the left lower extremity in traction,  and opposite lower extremity in a flexed abducted position. The arms were well  padded. Fluoroscopic imaging was used to verify reduction with gentle traction  and internal rotation. The left hip was then prepped and draped in the standard sterile fashion. An approximately 3 cm incision was made proximal  to the palpable greater trochanter tip. Dissection was carried down to  the tip and the short guidewire was placed under fluoroscopic imaging.  The proximal entry reamer was used to open the canal and the ball-tipped  guidewire was then placed and advanced down the femoral canal. This was  used to obtain a measurement for the implant and then  was subsequently  over reamed up to 12.5 mm by 0.5 mm increments. The 11x360 mm nail was then  advanced over the guidewire without difficulty and the proximal jig was  then placed and a small 1.5 cm incision was made on the lateral thigh to  advance the lag screw guide against the lateral aspect of the femur.  The guide pin was advanced and its position was verified in AP and  lateral planes to be centered in the head.  The guidewire was over  reamed and the appropriate size lag screw was advanced. The  proximal set screw was then advanced, backed off a quarter turn to allow  sliding. AP and lateral imaging demonstrated appropriate position of  the screw and reduction of the fracture. The proximal jig was then  removed.  Final fluoroscopic imaging in AP and lateral planes at the hip and the  knee demonstrated near anatomic reduction with appropriate length and  position of the hardware. All wounds were then copiously irrigated with  normal saline and subsequently closed in layers with #1 Vicryl in a deep  fascia layer, 2-0 Vicryl in a deep dermal layer, and staples for skin  closure. Sterile dressings were then applied including 4x4s and Mepilex  dressings. The patient was then taken off the fracture table,  transferred to the stretcher, and taken to the recovery room in stable  condition after she was extubated.   POSTOPERATIVE PLAN:  He will be weightbearing as tolerated on the operative Extremity. He will have DVT prophylaxis of lovenox x 3 wks

## 2016-07-01 NOTE — Progress Notes (Signed)
   Responded to Spiritual Care Consult for Advanced Directive (AD).  Patient unavailable (in surgery)  Will follow, as needed.   - Rev. Chaplain Carnella [place]

## 2016-07-01 NOTE — Clinical Social Work Placement (Signed)
   CLINICAL SOCIAL WORK PLACEMENT  NOTE  Date:  07/01/2016  Patient Details  Name: Brian Montgomery MRN: [id number] Date of Birth: 05-15-1936  Clinical Social Work is seeking post-discharge placement for this patient at the [place] level of care (*CSW will initial, date and re-position this form in  chart as items are completed):      Patient/family provided with [place] Work Department's list of facilities offering this level of care within the geographic area requested by the patient (or if unable, by the patient's family).      Patient/family informed of their freedom to choose among providers that offer the needed level of care, that participate in Medicare, Medicaid or managed care program needed by the patient, have an available bed and are willing to accept the patient.      Patient/family informed of [facility]'s ownership interest in [facility] and [facility], as well as of the fact that they are under no obligation to receive care at these facilities.  PASRR submitted to EDS on       PASRR number received on       Existing PASRR number confirmed on 07/01/16     FL2 transmitted to all facilities in geographic area requested by pt/family on 07/01/16     FL2 transmitted to all facilities within larger geographic area on       Patient informed that his/her managed care company has contracts with or will negotiate with certain facilities, including the following:            Patient/family informed of bed offers received.  Patient chooses bed at       Physician recommends and patient chooses bed at      Patient to be transferred to   on  .  Patient to be transferred to facility by       Patient family notified on   of transfer.  Name of family member notified:        PHYSICIAN Please prepare priority discharge summary, including medications, Please prepare prescriptions, Please sign FL2     Additional Comment:     _______________________________________________ Alla German, LCSW 07/01/2016, 2:04 PM

## 2016-07-01 NOTE — Progress Notes (Signed)
   PATIENT ID: Brian Montgomery   Day of Surgery Procedure(s) (LRB): INTRAMEDULLARY (IM) NAIL INTERTROCHANTRIC (Left)  Subjective: Doing well, mod pain L hip.  Ready for surgery  Objective:  Vitals:   07/01/16 0625 07/01/16 1341  BP: 112/62 121/68  Pulse: 94 86  Resp: 18 18  Temp: 98.4 F (36.9 C) 98.1 F (36.7 C)     L hip mild swelling.  LLE shortened and ER. NVID   Labs:   Recent Labs  06/30/16 1441 06/30/16 2032 07/01/16 0645  HGB 9.9* 9.9* 9.7*   Recent Labs  06/30/16 2032 07/01/16 0645  WBC 8.6 6.5  RBC 3.29* 3.22*  HCT 29.6* 29.7*  PLT 157 141*   Recent Labs  06/30/16 1441 06/30/16 2032 07/01/16 0645  NA 137  --  138  K 3.9  --  4.5  CL 103  --  102  CO2 24  --  27  BUN 26*  --  24*  CREATININE 1.74* 1.77* 1.78*  GLUCOSE 113*  --  113*  CALCIUM 9.0  --  9.4    Assessment and Plan:L hip intertroch fx Plan L hip troch nail Risks / benefits of surgery discussed Consent on chart  NPO for OR Preop antibiotics

## 2016-07-01 NOTE — NC FL2 (Signed)
MEDICAID FL2 LEVEL OF CARE SCREENING TOOL     IDENTIFICATION  Patient Name: Colsen Eastman Birthdate: [birth year] Sex: male Admission Date (Current Location): 06/30/2016  [facility] and [place] Number:  Herbalist and Address:  [facility] [place] [address] [place], [place] [address]      Provider Number: [id number]  Attending Physician Name and Address:  Shon Millet*  Relative Name and Phone Number:       Current Level of Care: Hospital Recommended Level of Care: [place]  Prior Approval Number:    Date Approved/Denied:   PASRR Number: [id number]  Discharge Plan: SNF    Current Diagnoses: Patient Active Problem List   Diagnosis Date Noted  . CKD (chronic kidney disease) stage 3, GFR 30-59 ml/min 07/01/2016  . Hip fracture ([place]) 06/30/2016  . Weakness generalized 06/13/2016  . Malignant neoplasm of prostate ([place]) 03/18/2016  . Bladder cancer ([place]) 01/21/2016  . Appendicular ataxia 09/24/2015  . Pruritus 09/24/2015  . Hereditary and idiopathic peripheral neuropathy 01/14/2015  . Gait instability 10/13/2014  . Hyperlipidemia with target LDL less than 100 08/06/2013  . Essential hypertension, benign 09/17/2012    Orientation RESPIRATION BLADDER Height & Weight     Self, Time, Situation, Place  Normal Incontinent Weight:   Height:     BEHAVIORAL SYMPTOMS/MOOD NEUROLOGICAL BOWEL NUTRITION STATUS      Continent    AMBULATORY STATUS COMMUNICATION OF NEEDS Skin   Extensive Assist Verbally Surgical wounds                       Personal Care Assistance Level of Assistance  Bathing, Feeding, Dressing Bathing Assistance: Maximum assistance Feeding assistance: Limited assistance Dressing Assistance: Maximum assistance     Functional Limitations Info  Sight, Hearing, Speech Sight Info: Adequate Hearing Info: Adequate Speech Info: Adequate    SPECIAL CARE FACTORS FREQUENCY  PT (By licensed  PT), OT (By licensed OT)                    Contractures Contractures Info: Not present    Additional Factors Info  Code Status, Allergies Code Status Info: Full Allergies Info: No known allergies           Current Medications (07/01/2016):  This is the current hospital active medication list Current Facility-Administered Medications  Medication Dose Route Frequency Provider Last Rate Last Dose  . acetaminophen (TYLENOL) tablet 650 mg  650 mg Oral Q6H PRN Elwin Mocha, MD       Or  . acetaminophen (TYLENOL) suppository 650 mg  650 mg Rectal Q6H PRN Elwin Mocha, MD      . carbidopa-levodopa (SINEMET IR) 25-100 MG per tablet immediate release 1 tablet  1 tablet Oral TID Elwin Mocha, MD   1 tablet at 07/01/16 1020  . ceFAZolin (ANCEF) IVPB 2g/100 mL premix  2 g Intravenous On Call to OR Tania Ade, MD      . dextrose 5 % and 0.45 % NaCl with KCl 20 mEq/L infusion   Intravenous Continuous Elwin Mocha, MD 125 mL/hr at 07/01/16 0820    . donepezil (ARICEPT) tablet 5 mg  5 mg Oral Daily Elwin Mocha, MD   5 mg at 07/01/16 1020  . enoxaparin (LOVENOX) injection 30 mg  30 mg Subcutaneous Q24H Elwin Mocha, MD   30 mg at 06/30/16 2100  . hydrALAZINE (APRESOLINE) injection 10 mg  10 mg Intravenous  Q8H PRN Elwin Mocha, MD      . hydrochlorothiazide (MICROZIDE) capsule 12.5 mg  12.5 mg Oral Daily Elwin Mocha, MD   12.5 mg at 07/01/16 1020  . lisinopril (PRINIVIL,ZESTRIL) tablet 10 mg  10 mg Oral Daily Elwin Mocha, MD   10 mg at 07/01/16 1020  . ondansetron (ZOFRAN) tablet 4 mg  4 mg Oral Q6H PRN Elwin Mocha, MD       Or  . ondansetron [facility]) injection 4 mg  4 mg Intravenous Q6H PRN Elwin Mocha, MD      . polyethylene glycol [facility] / GLYCOLAX) packet 17 g  17 g Oral Daily PRN Elwin Mocha, MD      . simvastatin (ZOCOR) tablet 20 mg  20 mg Oral Daily Elwin Mocha, MD         Discharge Medications: Please see discharge summary for a  list of discharge medications.  Relevant Imaging Results:  Relevant Lab Results:   Additional Information SSN: [id number]  Alla German, LCSW

## 2016-07-01 NOTE — Clinical Social Work Note (Signed)
Clinical Social Work Assessment  Patient Details  Name: Brian Montgomery MRN: [id number] Date of Birth: 28-Jan-1936  Date of referral:  07/01/16               Reason for consult:  Facility Placement                Permission sought to share information with:  Family Supports Permission granted to share information::  Yes, Verbal Permission Granted  Name::     [place]::     Relationship::  Spouse  Contact Information:  [phone]  Housing/Transportation Living arrangements for the past 2 months:  Single Family Home, [place] of Information:  Patient, Spouse, Adult Children Patient Interpreter Needed:  None Criminal Activity/Legal Involvement Pertinent to Current Situation/Hospitalization:  No - Comment as needed Significant Relationships:  Adult Children, Spouse, Other Family Members Lives with:  Spouse Do you feel safe going back to the place where you live?  Yes Need for family participation in patient care:  Yes (Comment)  Care giving concerns:  Pt's spouse and two adult children were at the bedside during initial assessment. Pt's spouse nor adult children had any concerns at this time. Pt's family verbalized understanding of CSW role.    Social Worker assessment / plan:  CSW spoke with pt and pt's family at bedside. Pt lives at home with his wife however, pt was at [facility], fell and went to [facility] and was admitted to [facility] for surgery. Pt and family agreeable to SNF placement at this time, if recommended post surgery. Pt and pt's family reports as of right now they believe they want him to go back to [facility]. CSW will reach out to [facility] and follow up after surgery.   Employment status:  Retired Forensic scientist:  Medicare PT Recommendations:  [employer] / Referral to community resources:  [place]  Patient/Family's Response to care:  Pt and pt's verbalized understanding of CSW role and  appreciation of support.   Patient/Family's Understanding of and Emotional Response to Diagnosis, Current Treatment, and Prognosis:  Pt and pt's family understanding and realistic regarding pt physical limitations. Pt and pt's family agreeable to SNF placement at d/c. Pt and pt's family denies any concern regarding pt treatment plan at this time.   Emotional Assessment Appearance:  Appears stated age Attitude/Demeanor/Rapport:   (Patient was appropriate.) Affect (typically observed):  Accepting, Appropriate, Calm Orientation:  Oriented to Self, Oriented to Place, Oriented to  Time, Oriented to Situation Alcohol / Substance use:  Not Applicable Psych involvement (Current and /or in the community):  No (Comment)  Discharge Needs  Concerns to be addressed:  No discharge needs identified Readmission within the last 30 days:  Yes Current discharge risk:  Dependent with Mobility Barriers to Discharge:  Continued Medical Work up   [employer], LCSW 07/01/2016, 1:22 PM

## 2016-07-01 NOTE — Anesthesia Procedure Notes (Addendum)
Procedure Name: Intubation Date/Time: 07/01/2016 3:22 PM Performed by: Trixie Deis A Pre-anesthesia Checklist: Patient identified, Emergency Drugs available, Suction available and Patient being monitored Patient Re-evaluated:Patient Re-evaluated prior to inductionOxygen Delivery Method: Circle System Utilized Preoxygenation: Pre-oxygenation with 100% oxygen Intubation Type: IV induction Ventilation: Mask ventilation without difficulty Tube type: Oral Tube size: 7.5 mm Number of attempts: 1 Airway Equipment and Method: Stylet and Oral airway Placement Confirmation: ETT inserted through vocal cords under direct vision,  positive ETCO2 and breath sounds checked- equal and bilateral Secured at: 23 cm Tube secured with: Tape Dental Injury: Teeth and Oropharynx as per pre-operative assessment

## 2016-07-01 NOTE — Progress Notes (Signed)
PROGRESS NOTE    Brian Montgomery  [id number] DOB: 1935/08/21 DOA: 06/30/2016 PCP: Wyatt Haste, MD     Brief Narrative:  Brian Montgomery is a 80 y.o. male  with past mental history negative for bladder cancer, hypertension and dementia presents to the emergency room after fall. Patient states that over last few days he has been well. Patient states that earlier he tried to stand from a chair to pick up something he dropped and he tipped over. Patient denies any hitting of his head. He resides at [facility]. Workup revealed hip fracture and orthopedic surgery was consulted.   Assessment & Plan:   Principal Problem:   Hip fracture ([place]) Active Problems:   Essential hypertension, benign   Hyperlipidemia with target LDL less than 100   Bladder cancer (H[facility])   CKD (chronic kidney disease) stage 3, GFR 30-59 ml/min   Left hip fracture after fall  -CT head unremarkable  -Orthopedic surgery consulted, plan for surgical intervention 12/22  [facility] Safe pre-op risk calculation 0.37% for estimated risk probability for periop MI or cardiac arrest  Essential hypertension -Continue home lisinopril and hydrochlorothiazide -Stable   HLD -Continue zocor   CKD stage 3 -Baseline Cr 1.4-1.7  -Stable   Dementia/Parkinsons -Continue aricept and sinemet     DVT prophylaxis: lovenox Code Status: Full Family Communication: No family at bedside Disposition Plan: Pending operative course and recovery   Consultants:   Orthopedic surgery   Procedures:   None  Antimicrobials:   None     Subjective: Patient states that he was trying to get out of chair, lost his balance and fell on his left side. He states that staff from [facility] helped him up. Currently, pain is well controlled. He has no complaints. Denies any chest pain, shortness of breath, nausea this morning.  Objective: Vitals:   06/30/16 1759 06/30/16 1838 06/30/16 2136 07/01/16 0625  BP: 121/63 (!) 115/57 (!) 111/59  112/62  Pulse: 96 91 84 94  Resp: 18 18 16 18   Temp:  99.3 F (37.4 C) 98.1 F (36.7 C) 98.4 F (36.9 C)  TempSrc:   Oral Oral  SpO2: 100% 100% 97% 100%   No intake or output data in the 24 hours ending 07/01/16 0823 There were no vitals filed for this visit.  Examination:  General exam: Appears calm and comfortable  Respiratory system: Clear to auscultation. Respiratory effort normal. Cardiovascular system: S1 & S2 heard, RRR. No JVD, murmurs, rubs, gallops or clicks. No pedal edema. Gastrointestinal system: Abdomen is nondistended, soft and nontender. No organomegaly or masses felt. Normal bowel sounds heard. Central nervous system: Alert and oriented. No focal neurological deficits. Skin: No rashes, lesions or ulcers Psychiatry: Judgement and insight appear normal. Mood & affect appropriate.   Data Reviewed: I have personally reviewed following labs and imaging studies  CBC:  Recent Labs Lab 06/30/16 1441 06/30/16 2032 07/01/16 0645  WBC 6.5 8.6 6.5  NEUTROABS 5.2  --   --   HGB 9.9* 9.9* 9.7*  HCT 29.0* 29.6* 29.7*  MCV 89.8 90.0 92.2  PLT 173 157 [id number]*   Basic Metabolic Panel:  Recent Labs Lab 06/30/16 1441 06/30/16 2032 07/01/16 0645  NA 137  --  138  K 3.9  --  4.5  CL 103  --  102  CO2 24  --  27  GLUCOSE 113*  --  113*  BUN 26*  --  24*  CREATININE 1.74* 1.77* 1.78*  CALCIUM 9.0  --  9.4  GFR: CrCl cannot be calculated (Unknown ideal weight.). Liver Function Tests: No results for input(s): AST, ALT, ALKPHOS, BILITOT, PROT, ALBUMIN in the last 168 hours. No results for input(s): LIPASE, AMYLASE in the last 168 hours. No results for input(s): AMMONIA in the last 168 hours. Coagulation Profile:  Recent Labs Lab 06/30/16 1441 07/01/16 0645  INR 1.08 1.12   Cardiac Enzymes: No results for input(s): CKTOTAL, CKMB, CKMBINDEX, TROPONINI in the last 168 hours. BNP (last 3 results) No results for input(s): PROBNP in the last 8760  hours. HbA1C: No results for input(s): HGBA1C in the last 72 hours. CBG: No results for input(s): GLUCAP in the last 168 hours. Lipid Profile: No results for input(s): CHOL, HDL, LDLCALC, TRIG, CHOLHDL, LDLDIRECT in the last 72 hours. Thyroid Function Tests: No results for input(s): TSH, T4TOTAL, FREET4, T3FREE, THYROIDAB in the last 72 hours. Anemia Panel: No results for input(s): VITAMINB12, FOLATE, FERRITIN, TIBC, IRON, RETICCTPCT in the last 72 hours. Sepsis Labs: No results for input(s): PROCALCITON, LATICACIDVEN in the last 168 hours.  Recent Results (from the past 240 hour(s))  Surgical PCR screen     Status: None   Collection Time: 06/30/16  9:42 PM  Result Value Ref Range Status   MRSA, PCR NEGATIVE NEGATIVE Final   Staphylococcus aureus NEGATIVE NEGATIVE Final    Comment:        The Xpert SA Assay (FDA approved for NASAL specimens in patients over 30 years of age), is one component of a comprehensive surveillance program.  Test performance has been validated by [facility] for patients greater than or equal to 34 year old. It is not intended to diagnose infection nor to guide or monitor treatment.        Radiology Studies: Dg Chest 1 View  Result Date: 06/30/2016 CLINICAL DATA:  Pain following fall EXAM: CHEST 1 VIEW COMPARISON:  June 17, 2016 FINDINGS: There is no edema or consolidation. Heart size and pulmonary vascularity are normal. No adenopathy. No bone lesions. No pneumothorax. IMPRESSION: No edema or consolidation. Electronically Signed   By: Lowella Grip III M.D.   On: 06/30/2016 14:41   Dg Knee 1-2 Views Left  Result Date: 06/30/2016 CLINICAL DATA:  Fall last night out of wheelchair. Left knee pain. Initial encounter. EXAM: LEFT KNEE - 1-2 VIEW COMPARISON:  None. FINDINGS: No evidence of fracture, dislocation, or joint effusion. Total knee arthroplasty seen in expected position. No evidence of hardware loosening or other bone lesions. Mild  peripheral vascular calcification noted. IMPRESSION: No acute findings. Electronically Signed   By: Earle Gell M.D.   On: 06/30/2016 17:32   Ct Head Wo Contrast  Result Date: 06/30/2016 CLINICAL DATA:  Fall last night.  Hip fracture.  Initial encounter. EXAM: CT HEAD WITHOUT CONTRAST TECHNIQUE: Contiguous axial images were obtained from the base of the skull through the vertex without intravenous contrast. COMPARISON:  06/13/2016 FINDINGS: Brain: No evidence of acute infarction, hemorrhage, hydrocephalus, extra-axial collection or mass lesion/mass effect. Moderate chronic microvascular ischemic gliosis in the cerebral white matter. Mild cortical atrophy for age. Vascular: Atherosclerotic calcification. Skull: Negative for fracture Sinuses/Orbits: Negative IMPRESSION: No acute finding. Electronically Signed   By: Monte Fantasia M.D.   On: 06/30/2016 17:37   Dg Hip Unilat With Pelvis 2-3 Views Left  Result Date: 06/30/2016 CLINICAL DATA:  Fall, diffuse left hip pain EXAM: DG HIP (WITH OR WITHOUT PELVIS) 2-3V LEFT COMPARISON:  None available FINDINGS: Bones are osteopenic. There is an acute displaced and angulated left  hip intertrochanteric fracture. No associated hip dislocation. Bony pelvis appears intact. Degenerative changes noted of the spine and SI joints. Right hip unremarkable. IMPRESSION: Acute displaced and angulated left hip intertrochanteric fracture. Degenerative changes and osteopenia Electronically Signed   By: Jerilynn Mages.  Shick M.D.   On: 06/30/2016 14:42   Dg Femur Min 2 Views Left  Result Date: 06/30/2016 CLINICAL DATA:  Fall off of wheelchair last night. Left femur pain. Initial encounter. EXAM: LEFT FEMUR 2 VIEWS COMPARISON:  Left hip radiographs also obtained today FINDINGS: Comminuted intertrochanteric left hip fracture is seen on dedicated left hip radiographs. No distal femur fracture visualized on these views. Total knee arthroplasty noted as well as peripheral vascular calcification.  IMPRESSION: No distal femur fracture identified. Comminuted intertrochanteric fracture of left hip demonstrated on separate left hip radiographs obtained today. Electronically Signed   By: Earle Gell M.D.   On: 06/30/2016 17:34      Scheduled Meds: . carbidopa-levodopa  1 tablet Oral TID  .  ceFAZolin (ANCEF) IV  2 g Intravenous On Call to OR  . donepezil  5 mg Oral Daily  . enoxaparin (LOVENOX) injection  30 mg Subcutaneous Q24H  . hydrochlorothiazide  12.5 mg Oral Daily  . lisinopril  10 mg Oral Daily  . simvastatin  20 mg Oral Daily   Continuous Infusions: . dextrose 5 % and 0.45 % NaCl with KCl 20 mEq/L 125 mL/hr at 07/01/16 0820     LOS: 1 day    Time spent: 40 minutes   Dessa Phi, DO [facility] [email or web address] Password [facility] 07/01/2016, 8:23 AM

## 2016-07-01 NOTE — Anesthesia Preprocedure Evaluation (Addendum)
Anesthesia Evaluation  Patient identified by MRN, date of birth, ID band Patient awake  General Assessment Comment:Debilitated, hx of bladder ca  Reviewed: Allergy & Precautions, NPO status , Patient's Chart, lab work & pertinent test results  Airway Mallampati: II  TM Distance: >3 FB Neck ROM: Full    Dental  (+) Edentulous Upper, Missing   Pulmonary neg pulmonary ROS, former smoker,    breath sounds clear to auscultation       Cardiovascular hypertension,  Rhythm:Regular Rate:Normal     Neuro/Psych  Neuromuscular disease    GI/Hepatic   Endo/Other    Renal/GU Renal InsufficiencyRenal disease     Musculoskeletal  (+) Arthritis ,   Abdominal   Peds  Hematology  (+) anemia ,   Anesthesia Other Findings   Reproductive/Obstetrics                            Anesthesia Physical Anesthesia Plan  ASA: III  Anesthesia Plan: General   Post-op Pain Management:    Induction: Intravenous  Airway Management Planned: Oral ETT  Additional Equipment:   Intra-op Plan:   Post-operative Plan: Extubation in OR  Informed Consent: I have reviewed the patients History and Physical, chart, labs and discussed the procedure including the risks, benefits and alternatives for the proposed anesthesia with the patient or authorized representative who has indicated his/her understanding and acceptance.     Plan Discussed with:   Anesthesia Plan Comments:         Anesthesia Quick Evaluation

## 2016-07-01 NOTE — Transfer of Care (Signed)
Immediate Anesthesia Transfer of Care Note  Patient: [facility]  Procedure(s) Performed: Procedure(s): INTRAMEDULLARY (IM) NAIL INTERTROCHANTERIC (Left)  Patient Location: PACU  Anesthesia Type:General  Level of Consciousness: sedated  Airway & Oxygen Therapy: Patient Spontanous Breathing and Patient connected to nasal cannula oxygen  Post-op Assessment: Report given to RN and Post -op Vital signs reviewed and stable  Post vital signs: Reviewed and stable  Last Vitals:  Vitals:   07/01/16 0625 07/01/16 1341  BP: 112/62 121/68  Pulse: 94 86  Resp: 18 18  Temp: 36.9 C 36.7 C    Last Pain:  Vitals:   07/01/16 1341  TempSrc: Oral  PainSc:          Complications: No apparent anesthesia complications

## 2016-07-01 NOTE — Plan of Care (Signed)
Problem: Safety: Goal: Ability to remain free from injury will improve Outcome: Progressing Safety precautions and fall preventions maintained  Problem: Pain Managment: Goal: General experience of comfort will improve Outcome: Progressing Medicated once for pain with full relief  Problem: Skin Integrity: Goal: Risk for impaired skin integrity will decrease Outcome: Progressing No skin issues noted

## 2016-07-01 NOTE — Progress Notes (Signed)
Report called to [facility] in short stay. All questions/concerns addressed. Will continue to monitor

## 2016-07-02 LAB — BASIC METABOLIC PANEL
ANION GAP: 7 (ref 5–15)
BUN: 22 mg/dL — ABNORMAL HIGH (ref 6–20)
CALCIUM: 8.5 mg/dL — AB (ref 8.9–10.3)
CO2: 25 mmol/L (ref 22–32)
Chloride: 101 mmol/L (ref 101–111)
Creatinine, Ser: 1.78 mg/dL — ABNORMAL HIGH (ref 0.61–1.24)
GFR, EST AFRICAN AMERICAN: 40 mL/min — AB (ref >60)
GFR, EST NON AFRICAN AMERICAN: 35 mL/min — AB (ref >60)
GLUCOSE: 111 mg/dL — AB (ref 65–99)
POTASSIUM: 4.6 mmol/L (ref 3.5–5.1)
Sodium: 133 mmol/L — ABNORMAL LOW (ref 135–145)

## 2016-07-02 LAB — CBC WITH DIFFERENTIAL/PLATELET
BASOS ABS: 0 10*3/uL (ref 0.0–0.1)
BASOS PCT: 0 %
EOS ABS: 0.3 10*3/uL (ref 0.0–0.7)
EOS PCT: 5 %
HCT: 25.1 % — ABNORMAL LOW (ref 39.0–52.0)
Hemoglobin: 8.5 g/dL — ABNORMAL LOW (ref 13.0–17.0)
LYMPHS PCT: 8 %
Lymphs Abs: 0.5 10*3/uL — ABNORMAL LOW (ref 0.7–4.0)
MCH: 30.4 pg (ref 26.0–34.0)
MCHC: 33.9 g/dL (ref 30.0–36.0)
MCV: 89.6 fL (ref 78.0–100.0)
MONO ABS: 0.5 10*3/uL (ref 0.1–1.0)
Monocytes Relative: 9 %
Neutro Abs: 4.9 10*3/uL (ref 1.7–7.7)
Neutrophils Relative %: 78 %
PLATELETS: 121 10*3/uL — AB (ref 150–400)
RBC: 2.8 MIL/uL — AB (ref 4.22–5.81)
RDW: 16.9 % — AB (ref 11.5–15.5)
WBC: 6.3 10*3/uL (ref 4.0–10.5)

## 2016-07-02 MED ORDER — HYDROCODONE-ACETAMINOPHEN 5-325 MG PO TABS: 1.0000 | ORAL_TABLET | Freq: Four times a day (QID) | ORAL | 0 refills | Status: DC | PRN

## 2016-07-02 MED ORDER — ENOXAPARIN SODIUM 30 MG/0.3ML ~~LOC~~ SOLN: 30.0000 mg | SUBCUTANEOUS | 0 refills | Status: DC

## 2016-07-02 NOTE — Progress Notes (Signed)
Subjective: 1 Day Post-Op Procedure(s) (LRB): INTRAMEDULLARY (IM) NAIL INTERTROCHANTERIC (Left)   Patient is resting comfortably in bed with family bedside. No complaints.  Activity level:  wbat Diet tolerance:  ok Voiding:  Foley in place Patient reports pain as mild.    Objective: Vital signs in last 24 hours: Temp:  [97.7 F (36.5 C)-101.1 F (38.4 C)] 98.8 F (37.1 C) (12/23 0601) Pulse Rate:  [80-126] 106 (12/23 0601) Resp:  [17-22] 18 (12/22 1755) BP: (111-144)/(60-87) 111/60 (12/23 0601) SpO2:  [94 %-100 %] 99 % (12/23 0601)  Labs:  Recent Labs  06/30/16 1441 06/30/16 2032 07/01/16 0645 07/01/16 1823 07/02/16 0554  HGB 9.9* 9.9* 9.7* 10.3* 8.5*    Recent Labs  07/01/16 1823 07/02/16 0554  WBC 8.1 6.3  RBC 3.43* 2.80*  HCT 31.6* 25.1*  PLT 136* 121*    Recent Labs  07/01/16 0645 07/01/16 1823 07/02/16 0554  NA 138  --  133*  K 4.5  --  4.6  CL 102  --  101  CO2 27  --  25  BUN 24*  --  22*  CREATININE 1.78* 1.43* 1.78*  GLUCOSE 113*  --  111*  CALCIUM 9.4  --  8.5*    Recent Labs  06/30/16 1441 07/01/16 0645  INR 1.08 1.12    Physical Exam:  Neurologically intact ABD soft Neurovascular intact Sensation intact distally Intact pulses distally Dorsiflexion/Plantar flexion intact Incision: dressing C/D/I and no drainage No cellulitis present Compartment soft  Assessment/Plan:  1 Day Post-Op Procedure(s) (LRB): INTRAMEDULLARY (IM) NAIL INTERTROCHANTERIC (Left) Advance diet Up with therapy Discharge to SNF back to [facility] once cleared by PT and medical team. We greatly appreciate medical management. Continue on Lovenox for DVT prevention. Continue WBAT left leg. Follow up with Dr. Tamera Punt in office 2 weeks post op.  Alanni Vader, Larwance Sachs 07/02/2016, 10:18 AM

## 2016-07-02 NOTE — Progress Notes (Signed)
OT Cancellation Note  Patient Details Name: [facility] MRN: [id number] DOB: 1936-06-24   Cancelled Treatment:    Reason Eval/Treat Not Completed: Other (comment): OT screened. Pt planning to D/C to SNF for continued rehabilitation. All further OT needs can be met at next venue of care. Acute OT will defer to SNF and sign off.  [address] OTR/L [phone] 07/02/2016, 9:57 AM

## 2016-07-02 NOTE — Evaluation (Signed)
Physical Therapy Evaluation Patient Details Name: [facility] MRN: [id number] DOB: 1936-06-07 Today's Date: 07/02/2016   History of Present Illness  Pt is a 80 yo male admittion on 06/30/16 following fall out of his WC in SNF where he was recieving rehab resulting in left hip Fx. Pt underwent an IM nailing on 07/01/16. Pt was recently hospitalized and transferred to SNF earlier this month as he had increased weakness, dehydration, sepsis, and pneumonia resulting from completion of chemotherapy for bladder CA. PMH significant for HTN, Parkinson's disease, bladder CA, dementia.    Clinical Impression  Pt presents with the above diagnoses and below deficits for PT evaluation. Prior to admission, pt was receiving rehab at [facility] following a previous hospital stay. Pt and wife plan for him to return to this facility for additional rehab before returning home. Pt's wife is his PCG and pt will need to be more mobile before returning home. Pt was recently diagnosed with Parkinson's disease and performed some education to spouse regarding symptoms and disease management. Pt will benefit from continuing to be seen acutely to address the below deficits before DC back to SNF.     Follow Up Recommendations SNF    Equipment Recommendations  None recommended by PT    Recommendations for Other Services       Precautions / Restrictions Precautions Precautions: Fall Precaution Comments: [facility] 06/30/16 out of WC, Parkinson's disease Restrictions Weight Bearing Restrictions: Yes LLE Weight Bearing: Weight bearing as tolerated      Mobility  Bed Mobility Overal bed mobility: Needs Assistance Bed Mobility: Supine to Sit     Supine to sit: Max assist     General bed mobility comments: assist to elevate trunk, incr time.  Difficulty performing self righting to midline.    Transfers Overall transfer level: Needs assistance Equipment used: Rolling walker (2 wheeled) Transfers: Sit to/from Stand Sit to  Stand: Max assist;+2 physical assistance;+2 safety/equipment         General transfer comment: Max A for sit to stand due posterior trunk lean and decreased weight shift to left. Requires Stedy to safely transfer from bed to chair and pt is unable to fully weight bear through LLE.   Ambulation/Gait                Stairs            Wheelchair Mobility    Modified Rankin (Stroke Patients Only)       Balance Overall balance assessment: Needs assistance Sitting-balance support: Bilateral upper extremity supported;Feet supported Sitting balance-Leahy Scale: Poor Sitting balance - Comments: leans to Left, able to correct with assisted.  Postural control: Left lateral lean;Posterior lean Standing balance support: Bilateral upper extremity supported Standing balance-Leahy Scale: Zero Standing balance comment: Requires Max A to maintain standing and cannot stand for >10 sec                             Pertinent Vitals/Pain Pain Assessment: Faces Faces Pain Scale: Hurts even more Pain Location: left hip Pain Descriptors / Indicators: Guarding;Grimacing Pain Intervention(s): Repositioned;RN gave pain meds during session;Monitored during session;Premedicated before session;Ice applied    Home Living Family/patient expects to be discharged to:: Skilled nursing facility Living Arrangements: Spouse/significant other               Additional Comments: Lives with wife, but wants to finish up rehab at [facility]    Prior Function Level of Independence: Needs assistance  Comments: Max A at this time for mobility and ADLs. Wife was assisting prior to SNF placement     Hand Dominance   Dominant Hand: Right    Extremity/Trunk Assessment   Upper Extremity Assessment Upper Extremity Assessment: Generalized weakness    Lower Extremity Assessment Lower Extremity Assessment: Generalized weakness       Communication   Communication: No difficulties   Cognition Arousal/Alertness: Awake/alert Behavior During Therapy: WFL for tasks assessed/performed Overall Cognitive Status: Impaired/Different from baseline Area of Impairment: Following commands;Memory;Problem solving     Memory: Decreased short-term memory Following Commands: Follows one step commands with increased time     Problem Solving: Slow processing;Decreased initiation;Difficulty sequencing;Requires verbal cues;Requires tactile cues General Comments: wife answers most questions, pt has difficulty processing/recalling PLOF    General Comments      Exercises     Assessment/Plan    PT Assessment Patient needs continued PT services  PT Problem List Decreased strength;Decreased range of motion;Decreased activity tolerance;Decreased balance;Decreased mobility;Decreased coordination;Pain          PT Treatment Interventions DME instruction;Gait training;Functional mobility training;Therapeutic activities;Therapeutic exercise;Balance training    PT Goals (Current goals can be found in the Care Plan section)  Acute Rehab PT Goals Patient Stated Goal: to get back home PT Goal Formulation: With patient/family Time For Goal Achievement: 07/09/16 Potential to Achieve Goals: Fair    Frequency Min 3X/week   Barriers to discharge        Co-evaluation               End of Session Equipment Utilized During Treatment: Gait belt;Other (comment) (STEDY) Activity Tolerance: Patient tolerated treatment well;Patient limited by pain Patient left: in chair;with call bell/phone within reach;with family/visitor present Nurse Communication: Mobility status;Need for lift equipment         Time: [id number] PT Time Calculation (min) (ACUTE ONLY): 47 min   Charges:   PT Evaluation $PT Eval Moderate Complexity: 1 Procedure PT Treatments $Therapeutic Activity: 23-37 mins   PT G Codes:        Scheryl Marten PT, DPT  [phone]  07/02/2016, 1:10 PM

## 2016-07-02 NOTE — Plan of Care (Signed)
Problem: Pain Managment: Goal: General experience of comfort will improve Outcome: Progressing Medicated twice for pain this shift  Problem: Bowel/Gastric: Goal: Will not experience complications related to bowel motility Outcome: Progressing No gastric issues noted

## 2016-07-02 NOTE — Progress Notes (Signed)
PROGRESS NOTE    Brian Montgomery  [id number] DOB: May 24, 1936 DOA: 06/30/2016 PCP: Wyatt Haste, MD     Brief Narrative:  Brian Montgomery is a 80 y.o. male  with past mental history negative for bladder cancer, hypertension and dementia presents to the emergency room after fall. Patient states that over last few days he has been well. Patient states that earlier he tried to stand from a chair to pick up something he dropped and he tipped over. Patient denies any hitting of his head. [facility] resides at [facility]. Workup revealed hip fracture and orthopedic surgery was consulted. Patient underwent intramedullary fixation 12/22.    Assessment & Plan:   Principal Problem:   Hip fracture ([place]) Active Problems:   Essential hypertension, benign   Hyperlipidemia with target LDL less than 100   Bladder cancer (HCC)   CKD (chronic kidney disease) stage 3, GFR 30-59 ml/min   Intertrochanteric fracture of left hip, closed, initial encounter ([place])   Left hip fracture after fall  -CT head unremarkable  -S/p intramedullary fixation 12/22 -Follow up with Dr. Tamera Punt in 2 weeks  -PT/OT  SIRS without source of infection  -Post-operatively. Could be stress response but will check blood cultures, UA. Remove foley.   Essential hypertension -Continue home lisinopril and hydrochlorothiazide -Stable   HLD -Continue zocor   CKD stage 3 -Baseline Cr 1.4-1.7  -Stable   Dementia/Parkinsons -Continue aricept and sinemet     DVT prophylaxis: lovenox Code Status: Full Family Communication: family at bedside Disposition Plan: SNF if afebrile tomorrow and blood cultures negative    Consultants:   Orthopedic surgery   Procedures:   None  Antimicrobials:   None     Subjective: No complaints today. Recovering well from surgery. Appetite is good. No Cp, SOB, N/V/D. Has some soreness in left hip    Objective: Vitals:   07/02/16 0123 07/02/16 0127 07/02/16 0316 07/02/16 0601  BP:  124/63   111/60  Pulse: (!) 120   (!) 106  Resp:      Temp: (!) 101.1 F (38.4 C) (!) 100.7 F (38.2 C) 100 F (37.8 C) 98.8 F (37.1 C)  TempSrc: Axillary Oral  Oral  SpO2: 96%   99%    Intake/Output Summary (Last 24 hours) at 07/02/16 1213 Last data filed at 07/01/16 1611  Gross per 24 hour  Intake              800 ml  Output               75 ml  Net              725 ml   There were no vitals filed for this visit.  Examination:  General exam: Appears calm and comfortable  Respiratory system: Clear to auscultation. Respiratory effort normal. Cardiovascular system: S1 & S2 heard, RRR. No JVD, murmurs, rubs, gallops or clicks. No pedal edema. Gastrointestinal system: Abdomen is nondistended, soft and nontender. No organomegaly or masses felt. Normal bowel sounds heard. Central nervous system: Alert and oriented. No focal neurological deficits. Skin: No rashes, lesions or ulcers Psychiatry: Judgement and insight appear normal. Mood & affect appropriate.   Data Reviewed: I have personally reviewed following labs and imaging studies  CBC:  Recent Labs Lab 06/30/16 1441 06/30/16 2032 07/01/16 0645 07/01/16 1823 07/02/16 0554  WBC 6.5 8.6 6.5 8.1 6.3  NEUTROABS 5.2  --   --   --  4.9  HGB 9.9* 9.9* 9.7* 10.3* 8.5*  HCT  29.0* 29.6* 29.7* 31.6* 25.1*  MCV 89.8 90.0 92.2 92.1 89.6  PLT 173 157 141* 136* [id number]*   Basic Metabolic Panel:  Recent Labs Lab 06/30/16 1441 06/30/16 2032 07/01/16 0645 07/01/16 1823 07/02/16 0554  NA 137  --  138  --  133*  K 3.9  --  4.5  --  4.6  CL 103  --  102  --  101  CO2 24  --  27  --  25  GLUCOSE 113*  --  113*  --  111*  BUN 26*  --  24*  --  22*  CREATININE 1.74* 1.77* 1.78* 1.43* 1.78*  CALCIUM 9.0  --  9.4  --  8.5*   GFR: CrCl cannot be calculated (Unknown ideal weight.). Liver Function Tests: No results for input(s): AST, ALT, ALKPHOS, BILITOT, PROT, ALBUMIN in the last 168 hours. No results for input(s): LIPASE,  AMYLASE in the last 168 hours. No results for input(s): AMMONIA in the last 168 hours. Coagulation Profile:  Recent Labs Lab 06/30/16 1441 07/01/16 0645  INR 1.08 1.12   Cardiac Enzymes: No results for input(s): CKTOTAL, CKMB, CKMBINDEX, TROPONINI in the last 168 hours. BNP (last 3 results) No results for input(s): PROBNP in the last 8760 hours. HbA1C: No results for input(s): HGBA1C in the last 72 hours. CBG: No results for input(s): GLUCAP in the last 168 hours. Lipid Profile: No results for input(s): CHOL, HDL, LDLCALC, TRIG, CHOLHDL, LDLDIRECT in the last 72 hours. Thyroid Function Tests: No results for input(s): TSH, T4TOTAL, FREET4, T3FREE, THYROIDAB in the last 72 hours. Anemia Panel: No results for input(s): VITAMINB12, FOLATE, FERRITIN, TIBC, IRON, RETICCTPCT in the last 72 hours. Sepsis Labs: No results for input(s): PROCALCITON, LATICACIDVEN in the last 168 hours.  Recent Results (from the past 240 hour(s))  Surgical PCR screen     Status: None   Collection Time: 06/30/16  9:42 PM  Result Value Ref Range Status   MRSA, PCR NEGATIVE NEGATIVE Final   Staphylococcus aureus NEGATIVE NEGATIVE Final    Comment:        The Xpert SA Assay (FDA approved for NASAL specimens in patients over 71 years of age), is one component of a comprehensive surveillance program.  Test performance has been validated by [facility] for patients greater than or equal to 7 year old. It is not intended to diagnose infection nor to guide or monitor treatment.        Radiology Studies: Dg Chest 1 View  Result Date: 06/30/2016 CLINICAL DATA:  Pain following fall EXAM: CHEST 1 VIEW COMPARISON:  June 17, 2016 FINDINGS: There is no edema or consolidation. Heart size and pulmonary vascularity are normal. No adenopathy. No bone lesions. No pneumothorax. IMPRESSION: No edema or consolidation. Electronically Signed   By: Lowella Grip III M.D.   On: 06/30/2016 14:41   Dg Knee 1-2  Views Left  Result Date: 06/30/2016 CLINICAL DATA:  Fall last night out of wheelchair. Left knee pain. Initial encounter. EXAM: LEFT KNEE - 1-2 VIEW COMPARISON:  None. FINDINGS: No evidence of fracture, dislocation, or joint effusion. Total knee arthroplasty seen in expected position. No evidence of hardware loosening or other bone lesions. Mild peripheral vascular calcification noted. IMPRESSION: No acute findings. Electronically Signed   By: Earle Gell M.D.   On: 06/30/2016 17:32   Ct Head Wo Contrast  Result Date: 06/30/2016 CLINICAL DATA:  Fall last night.  Hip fracture.  Initial encounter. EXAM: CT HEAD WITHOUT CONTRAST TECHNIQUE:  Contiguous axial images were obtained from the base of the skull through the vertex without intravenous contrast. COMPARISON:  06/13/2016 FINDINGS: Brain: No evidence of acute infarction, hemorrhage, hydrocephalus, extra-axial collection or mass lesion/mass effect. Moderate chronic microvascular ischemic gliosis in the cerebral white matter. Mild cortical atrophy for age. Vascular: Atherosclerotic calcification. Skull: Negative for fracture Sinuses/Orbits: Negative IMPRESSION: No acute finding. Electronically Signed   By: Monte Fantasia M.D.   On: 06/30/2016 17:37   Dg Hip Unilat With Pelvis 2-3 Views Left  Result Date: 06/30/2016 CLINICAL DATA:  Fall, diffuse left hip pain EXAM: DG HIP (WITH OR WITHOUT PELVIS) 2-3V LEFT COMPARISON:  None available FINDINGS: Bones are osteopenic. There is an acute displaced and angulated left hip intertrochanteric fracture. No associated hip dislocation. Bony pelvis appears intact. Degenerative changes noted of the spine and SI joints. Right hip unremarkable. IMPRESSION: Acute displaced and angulated left hip intertrochanteric fracture. Degenerative changes and osteopenia Electronically Signed   By: Jerilynn Mages.  Shick M.D.   On: 06/30/2016 14:42   Dg Femur Min 2 Views Left  Result Date: 06/30/2016 CLINICAL DATA:  Fall off of wheelchair last  night. Left femur pain. Initial encounter. EXAM: LEFT FEMUR 2 VIEWS COMPARISON:  Left hip radiographs also obtained today FINDINGS: Comminuted intertrochanteric left hip fracture is seen on dedicated left hip radiographs. No distal femur fracture visualized on these views. Total knee arthroplasty noted as well as peripheral vascular calcification. IMPRESSION: No distal femur fracture identified. Comminuted intertrochanteric fracture of left hip demonstrated on separate left hip radiographs obtained today. Electronically Signed   By: Earle Gell M.D.   On: 06/30/2016 17:34      Scheduled Meds: . sodium chloride   Intravenous Once  . carbidopa-levodopa  1 tablet Oral TID  . donepezil  5 mg Oral Daily  . enoxaparin (LOVENOX) injection  30 mg Subcutaneous Q24H  . hydrochlorothiazide  12.5 mg Oral Daily  . lisinopril  10 mg Oral Daily  . simvastatin  20 mg Oral Daily   Continuous Infusions: . lactated ringers 10 mL/hr at 07/01/16 1422     LOS: 2 days    Time spent: 30 minutes   Dessa Phi, DO [facility] [email or web address] Password [facility] 07/02/2016, 12:13 PM

## 2016-07-03 DIAGNOSIS — S72145D Nondisplaced intertrochanteric fracture of left femur, subsequent encounter for closed fracture with routine healing: Secondary | ICD-10-CM | POA: Diagnosis not present

## 2016-07-03 DIAGNOSIS — M79602 Pain in left arm: Secondary | ICD-10-CM | POA: Diagnosis not present

## 2016-07-03 DIAGNOSIS — R531 Weakness: Secondary | ICD-10-CM | POA: Diagnosis not present

## 2016-07-03 DIAGNOSIS — I10 Essential (primary) hypertension: Secondary | ICD-10-CM | POA: Diagnosis not present

## 2016-07-03 DIAGNOSIS — F0281 Dementia in other diseases classified elsewhere with behavioral disturbance: Secondary | ICD-10-CM | POA: Diagnosis not present

## 2016-07-03 DIAGNOSIS — S72002A Fracture of unspecified part of neck of left femur, initial encounter for closed fracture: Secondary | ICD-10-CM | POA: Diagnosis not present

## 2016-07-03 DIAGNOSIS — M25552 Pain in left hip: Secondary | ICD-10-CM | POA: Diagnosis not present

## 2016-07-03 DIAGNOSIS — G609 Hereditary and idiopathic neuropathy, unspecified: Secondary | ICD-10-CM | POA: Diagnosis not present

## 2016-07-03 DIAGNOSIS — E784 Other hyperlipidemia: Secondary | ICD-10-CM | POA: Diagnosis not present

## 2016-07-03 DIAGNOSIS — F0390 Unspecified dementia without behavioral disturbance: Secondary | ICD-10-CM | POA: Diagnosis not present

## 2016-07-03 DIAGNOSIS — C61 Malignant neoplasm of prostate: Secondary | ICD-10-CM | POA: Diagnosis not present

## 2016-07-03 DIAGNOSIS — Z4789 Encounter for other orthopedic aftercare: Secondary | ICD-10-CM | POA: Diagnosis not present

## 2016-07-03 DIAGNOSIS — G231 Progressive supranuclear ophthalmoplegia [Steele-Richardson-Olszewski]: Secondary | ICD-10-CM | POA: Diagnosis not present

## 2016-07-03 DIAGNOSIS — E039 Hypothyroidism, unspecified: Secondary | ICD-10-CM | POA: Diagnosis not present

## 2016-07-03 DIAGNOSIS — R2681 Unsteadiness on feet: Secondary | ICD-10-CM | POA: Diagnosis not present

## 2016-07-03 DIAGNOSIS — S72009A Fracture of unspecified part of neck of unspecified femur, initial encounter for closed fracture: Secondary | ICD-10-CM | POA: Diagnosis not present

## 2016-07-03 DIAGNOSIS — R41841 Cognitive communication deficit: Secondary | ICD-10-CM | POA: Diagnosis not present

## 2016-07-03 DIAGNOSIS — M25551 Pain in right hip: Secondary | ICD-10-CM | POA: Diagnosis not present

## 2016-07-03 DIAGNOSIS — G20 Parkinson's disease: Secondary | ICD-10-CM | POA: Diagnosis not present

## 2016-07-03 DIAGNOSIS — C679 Malignant neoplasm of bladder, unspecified: Secondary | ICD-10-CM | POA: Diagnosis not present

## 2016-07-03 DIAGNOSIS — F482 Pseudobulbar affect: Secondary | ICD-10-CM | POA: Diagnosis not present

## 2016-07-03 DIAGNOSIS — E785 Hyperlipidemia, unspecified: Secondary | ICD-10-CM | POA: Diagnosis not present

## 2016-07-03 DIAGNOSIS — F329 Major depressive disorder, single episode, unspecified: Secondary | ICD-10-CM | POA: Diagnosis not present

## 2016-07-03 DIAGNOSIS — C678 Malignant neoplasm of overlapping sites of bladder: Secondary | ICD-10-CM | POA: Diagnosis not present

## 2016-07-03 DIAGNOSIS — S72142D Displaced intertrochanteric fracture of left femur, subsequent encounter for closed fracture with routine healing: Secondary | ICD-10-CM | POA: Diagnosis not present

## 2016-07-03 DIAGNOSIS — R278 Other lack of coordination: Secondary | ICD-10-CM | POA: Diagnosis not present

## 2016-07-03 DIAGNOSIS — W19XXXA Unspecified fall, initial encounter: Secondary | ICD-10-CM | POA: Diagnosis not present

## 2016-07-03 DIAGNOSIS — N179 Acute kidney failure, unspecified: Secondary | ICD-10-CM | POA: Diagnosis not present

## 2016-07-03 DIAGNOSIS — R259 Unspecified abnormal involuntary movements: Secondary | ICD-10-CM | POA: Diagnosis not present

## 2016-07-03 DIAGNOSIS — N390 Urinary tract infection, site not specified: Secondary | ICD-10-CM | POA: Diagnosis not present

## 2016-07-03 DIAGNOSIS — S72001A Fracture of unspecified part of neck of right femur, initial encounter for closed fracture: Secondary | ICD-10-CM | POA: Diagnosis not present

## 2016-07-03 DIAGNOSIS — R2689 Other abnormalities of gait and mobility: Secondary | ICD-10-CM | POA: Diagnosis not present

## 2016-07-03 DIAGNOSIS — F0391 Unspecified dementia with behavioral disturbance: Secondary | ICD-10-CM | POA: Diagnosis not present

## 2016-07-03 DIAGNOSIS — E46 Unspecified protein-calorie malnutrition: Secondary | ICD-10-CM | POA: Diagnosis not present

## 2016-07-03 DIAGNOSIS — W19XXXD Unspecified fall, subsequent encounter: Secondary | ICD-10-CM | POA: Diagnosis not present

## 2016-07-03 LAB — BASIC METABOLIC PANEL
ANION GAP: 11 (ref 5–15)
BUN: 26 mg/dL — ABNORMAL HIGH (ref 6–20)
CHLORIDE: 100 mmol/L — AB (ref 101–111)
CO2: 21 mmol/L — AB (ref 22–32)
Calcium: 8.9 mg/dL (ref 8.9–10.3)
Creatinine, Ser: 1.51 mg/dL — ABNORMAL HIGH (ref 0.61–1.24)
GFR calc non Af Amer: 42 mL/min — ABNORMAL LOW (ref >60)
GFR, EST AFRICAN AMERICAN: 49 mL/min — AB (ref >60)
Glucose, Bld: 90 mg/dL (ref 65–99)
Potassium: 4.5 mmol/L (ref 3.5–5.1)
Sodium: 132 mmol/L — ABNORMAL LOW (ref 135–145)

## 2016-07-03 LAB — CBC WITH DIFFERENTIAL/PLATELET
BASOS ABS: 0 10*3/uL (ref 0.0–0.1)
Basophils Relative: 1 %
Eosinophils Absolute: 0.3 10*3/uL (ref 0.0–0.7)
Eosinophils Relative: 3 %
HEMATOCRIT: 27.8 % — AB (ref 39.0–52.0)
HEMOGLOBIN: 9.3 g/dL — AB (ref 13.0–17.0)
Lymphocytes Relative: 9 %
Lymphs Abs: 0.8 10*3/uL (ref 0.7–4.0)
MCH: 29.8 pg (ref 26.0–34.0)
MCHC: 33.5 g/dL (ref 30.0–36.0)
MCV: 89.1 fL (ref 78.0–100.0)
MONOS PCT: 8 %
Monocytes Absolute: 0.6 10*3/uL (ref 0.1–1.0)
NEUTROS ABS: 6.7 10*3/uL (ref 1.7–7.7)
NEUTROS PCT: 79 %
Platelets: 129 10*3/uL — ABNORMAL LOW (ref 150–400)
RBC: 3.12 MIL/uL — ABNORMAL LOW (ref 4.22–5.81)
RDW: 16.9 % — ABNORMAL HIGH (ref 11.5–15.5)
WBC: 8.4 10*3/uL (ref 4.0–10.5)

## 2016-07-03 NOTE — Discharge Summary (Signed)
Physician Discharge Summary  Lary Yerger [id number] DOB: January 05, 1936 DOA: 06/30/2016  PCP: Wyatt Haste, MD  Admit date: 06/30/2016 Discharge date: 07/03/2016  Admitted From: [facility] Disposition:  [facility]   Recommendations for Outpatient Follow-up:  1. Follow up with PCP in 1-2 weeks 2. Follow up with Dr. Tamera Punt in weeks 3. Continue lovenox for 1 month for DVT prophylaxis  4. Continue WBAT left leg 5. Please obtain BMP/CBC in one week  6. Please follow up on the following pending results: final blood culture results. Negative at time of discharge.   Discharge Condition: Stable CODE STATUS: Full  Diet recommendation: Heart healthy   Brief/Interim Summary: Brian Collinsis a 80 y.o.malewith past medical history for bladder cancer, hypertension and dementia presents to the emergency room after fall. Patient states that over last fewdays he has been well. Patient states that earlier he tried to stand from a chair to pick up something he dropped and he tipped over. Patient denies any hitting of his head. He resides at [facility]. Workup revealed hip fracture and orthopedic surgery was consulted. Patient underwent intramedullary fixation 12/22. Post operatively, he had a fever and blood cultures were obtained. He remained afebrile for 24 hours and blood cultures are negative preliminarily. This should be followed up.   Subjective on day of discharge: Doing well, no complaints. Denies any fevers, chills, chest pain, SOB, abd pain, n/v. Leg is still sore but improved. Had Bm.   Discharge Diagnoses:  Principal Problem:   Hip fracture ([place]) Active Problems:   Essential hypertension, benign   Hyperlipidemia with target LDL less than 100   Bladder cancer (HCC)   CKD (chronic kidney disease) stage 3, GFR 30-59 ml/min   Intertrochanteric fracture of left hip, closed, initial encounter ([place])   Dementia  Left hip fracture after fall  -CT head unremarkable  -S/p  intramedullary fixation 12/22 -Follow up with Dr. Tamera Punt in 2 weeks  -PT/OT -Lovenox 1 month for DVT ppx   SIRS without source of infection  -Post-operatively. Could be stress response. Now afebrile. Remove foley. Follow up on blood culture results.   Essential hypertension -Continue home lisinopril and hydrochlorothiazide -Stable   HLD -Continue zocor   CKD stage 3 -Baseline Cr 1.4-1.7  -Stable   Dementia/Parkinsons -Continue aricept and sinemet   Discharge Instructions  Discharge Instructions    Diet - low sodium heart healthy    Complete by:  As directed    Increase activity slowly    Complete by:  As directed    Weight bearing as tolerated    Complete by:  As directed    Laterality:  left   Extremity:  Lower     Allergies as of 07/03/2016   No Known Allergies     Medication List    STOP taking these medications   amoxicillin-clavulanate 875-125 MG tablet Commonly known as:  AUGMENTIN     TAKE these medications   acetaminophen 325 MG tablet Commonly known as:  TYLENOL Take 650 mg by mouth every 6 (six) hours as needed for moderate pain.   benzonatate 100 MG capsule Commonly known as:  TESSALON Take 1 capsule (100 mg total) by mouth 3 (three) times daily as needed for cough.   carbidopa-levodopa 25-100 MG tablet Commonly known as:  SINEMET IR TAKE 1/2 TABLET BY MOUTH TWICE DAILY FOR 3 DAYS, 1/2 TABLET THREE TIMES DAILY FOR 3 DAYS, THEN 1 TABLET THREE TIMES DAILY What changed:  See the new instructions.   donepezil 5 MG tablet Commonly  known as:  ARICEPT Take 1 tablet (5 mg total) by mouth at bedtime. What changed:  when to take this   enoxaparin 30 MG/0.3ML injection Commonly known as:  LOVENOX Inject 0.3 mLs (30 mg total) into the skin daily.   HYDROcodone-acetaminophen 5-325 MG tablet Commonly known as:  NORCO/VICODIN Take 1-2 tablets by mouth every 6 (six) hours as needed for moderate pain.   lisinopril-hydrochlorothiazide 10-12.5  MG tablet Commonly known as:  PRINZIDE,ZESTORETIC Take 1 tablet by mouth daily.   loperamide 2 MG capsule Commonly known as:  IMODIUM Take 1 capsule (2 mg total) by mouth as needed for diarrhea or loose stools. What changed:  when to take this   phenazopyridine 100 MG tablet Commonly known as:  PYRIDIUM Take 1 tablet (100 mg total) by mouth 3 (three) times daily as needed for pain (for burning).   saccharomyces boulardii 250 MG capsule Commonly known as:  FLORASTOR Take 1 capsule (250 mg total) by mouth 2 (two) times daily.   simvastatin 20 MG tablet Commonly known as:  ZOCOR TAKE 1 TABLET BY MOUTH DAILY What changed:  See the new instructions.      Follow-up Information    Wyatt Haste, MD. Schedule an appointment as soon as possible for a visit in 1 week(s).   Specialty:  Family Medicine Contact information: [place] [address] [phone]        Nita Sells, MD. Schedule an appointment as soon as possible for a visit in 2 week(s).   Specialty:  Orthopedic Surgery Contact information: [place] [address] [phone]          No Known Allergies  Consultations:  Orthopedic    Procedures/Studies: Dg Chest 1 View  Result Date: 06/30/2016 CLINICAL DATA:  Pain following fall EXAM: CHEST 1 VIEW COMPARISON:  June 17, 2016 FINDINGS: There is no edema or consolidation. Heart size and pulmonary vascularity are normal. No adenopathy. No bone lesions. No pneumothorax. IMPRESSION: No edema or consolidation. Electronically Signed   By: Lowella Grip III M.D.   On: 06/30/2016 14:41   X-ray Chest Pa And Lateral  Result Date: 06/14/2016 CLINICAL DATA:  Generalized weakness EXAM: CHEST  2 VIEW COMPARISON:  06/13/2016 FINDINGS: Progression of bibasilar airspace disease since the prior study. Negative for heart failure. Negative for pleural effusion. IMPRESSION: Progression of bibasilar  atelectasis/infiltrate. Electronically Signed   By: Franchot Gallo M.D.   On: 06/14/2016 11:51   Dg Chest 2 View  Result Date: 06/13/2016 CLINICAL DATA:  Tachycardia, weakness and dizziness. EXAM: CHEST  2 VIEW COMPARISON:  None. FINDINGS: The heart size and mediastinal contours are within normal limits. Both lungs are clear. The visualized skeletal structures are unremarkable. IMPRESSION: No active cardiopulmonary disease. Electronically Signed   By: Fidela Salisbury M.D.   On: 06/13/2016 14:29   Dg Lumbar Spine Complete  Result Date: 06/13/2016 CLINICAL DATA:  Bilateral lower extremity weakness. Unsteady gait. Altered mental status. Recent radiation treatment for prostate cancer. EXAM: LUMBAR SPINE - COMPLETE 4+ VIEW COMPARISON:  CT abdomen and pelvis 01/29/2016 FINDINGS: There 5 non rib-bearing lumbar type vertebral bodies. Vertebral body heights are preserved without evidence of fracture. Moderate disc space narrowing is present at L4-5 and L5-S1, with mild disc space narrowing more proximally in the lumbar spine. Vacuum disc is apparent at L2-3, L3-4, and likely L1-2. Mild-to-moderate marginal endplate osteophyte formation is present throughout the lumbar spine. No pars defects are identified. There is mild-to-moderate mid to lower  lumbar spine facet arthrosis. Fiducials overlie the prostate. Aortic atherosclerosis is noted. IMPRESSION: 1. No evidence of acute osseous abnormality. 2. Moderate lumbar disc degeneration. 3. Aortic atherosclerosis. Electronically Signed   By: Logan Bores M.D.   On: 06/13/2016 14:31   Dg Knee 1-2 Views Left  Result Date: 06/30/2016 CLINICAL DATA:  Fall last night out of wheelchair. Left knee pain. Initial encounter. EXAM: LEFT KNEE - 1-2 VIEW COMPARISON:  None. FINDINGS: No evidence of fracture, dislocation, or joint effusion. Total knee arthroplasty seen in expected position. No evidence of hardware loosening or other bone lesions. Mild peripheral vascular  calcification noted. IMPRESSION: No acute findings. Electronically Signed   By: Earle Gell M.D.   On: 06/30/2016 17:32   Ct Head Wo Contrast  Result Date: 06/30/2016 CLINICAL DATA:  Fall last night.  Hip fracture.  Initial encounter. EXAM: CT HEAD WITHOUT CONTRAST TECHNIQUE: Contiguous axial images were obtained from the base of the skull through the vertex without intravenous contrast. COMPARISON:  06/13/2016 FINDINGS: Brain: No evidence of acute infarction, hemorrhage, hydrocephalus, extra-axial collection or mass lesion/mass effect. Moderate chronic microvascular ischemic gliosis in the cerebral white matter. Mild cortical atrophy for age. Vascular: Atherosclerotic calcification. Skull: Negative for fracture Sinuses/Orbits: Negative IMPRESSION: No acute finding. Electronically Signed   By: Monte Fantasia M.D.   On: 06/30/2016 17:37   Ct Head Wo Contrast  Result Date: 06/13/2016 CLINICAL DATA:  Altered mental status.  Bilateral leg weakness EXAM: CT HEAD WITHOUT CONTRAST CT CERVICAL SPINE WITHOUT CONTRAST TECHNIQUE: Multidetector CT imaging of the head and cervical spine was performed following the standard protocol without intravenous contrast. Multiplanar CT image reconstructions of the cervical spine were also generated. COMPARISON:  MRI head 10/27/2014.  MRI cervical spine 09/01/2015 FINDINGS: CT HEAD FINDINGS Brain: Generalized atrophy. Chronic microvascular ischemic changes in the white matter. Negative for acute infarct. Negative for hemorrhage or mass. Vascular: Negative for dense MCA. Skull: Negative for fracture Sinuses/Orbits: With negative Other: None CT CERVICAL SPINE FINDINGS Alignment: 3 mm anterior listhesis at C7-T1. Remaining alignment normal. Straightening of the cervical lordosis Skull base and vertebrae: Negative for fracture. Soft tissues and spinal canal: Atherosclerotic calcification. No soft tissue mass or swelling Disc levels: Disc degeneration and spurring at C3-4, C4-5, C5-6,  and C6-7 of a moderate degree. Degenerative anterolisthesis C7-T1 due to advanced facet degeneration. Upper chest: Negative Other: None IMPRESSION: Atrophy and chronic microvascular ischemic change. No acute intracranial abnormality Moderate cervical degenerative change. Negative for cervical spine fracture. Electronically Signed   By: Franchot Gallo M.D.   On: 06/13/2016 14:06   Ct Cervical Spine Wo Contrast  Result Date: 06/13/2016 CLINICAL DATA:  Altered mental status.  Bilateral leg weakness EXAM: CT HEAD WITHOUT CONTRAST CT CERVICAL SPINE WITHOUT CONTRAST TECHNIQUE: Multidetector CT imaging of the head and cervical spine was performed following the standard protocol without intravenous contrast. Multiplanar CT image reconstructions of the cervical spine were also generated. COMPARISON:  MRI head 10/27/2014.  MRI cervical spine 09/01/2015 FINDINGS: CT HEAD FINDINGS Brain: Generalized atrophy. Chronic microvascular ischemic changes in the white matter. Negative for acute infarct. Negative for hemorrhage or mass. Vascular: Negative for dense MCA. Skull: Negative for fracture Sinuses/Orbits: With negative Other: None CT CERVICAL SPINE FINDINGS Alignment: 3 mm anterior listhesis at C7-T1. Remaining alignment normal. Straightening of the cervical lordosis Skull base and vertebrae: Negative for fracture. Soft tissues and spinal canal: Atherosclerotic calcification. No soft tissue mass or swelling Disc levels: Disc degeneration and spurring at C3-4, C4-5, C5-6, and  C6-7 of a moderate degree. Degenerative anterolisthesis C7-T1 due to advanced facet degeneration. Upper chest: Negative Other: None IMPRESSION: Atrophy and chronic microvascular ischemic change. No acute intracranial abnormality Moderate cervical degenerative change. Negative for cervical spine fracture. Electronically Signed   By: Franchot Gallo M.D.   On: 06/13/2016 14:06   US Renal  Result Date: 06/14/2016 CLINICAL DATA:  Weakness. Increased  creatinine. Acute renal insufficiency. Bladder cancer. EXAM: RENAL / URINARY TRACT ULTRASOUND COMPLETE COMPARISON:  None. FINDINGS: Right Kidney: Length: 11.9 cm, within normal limits. Echogenicity within normal limits. No mass or hydronephrosis visualized. Left Kidney: Length: 11.7 cm, within normal limits. Echogenicity within normal limits. No mass or hydronephrosis visualized. Bladder: Appears normal for degree of bladder distention. IMPRESSION: Negative bilateral renal ultrasound. Electronically Signed   By: San Morelle M.D.   On: 06/14/2016 13:01   Dg Chest Port 1 View  Result Date: 06/17/2016 CLINICAL DATA:  Cough.  History of pneumonia. EXAM: PORTABLE CHEST 1 VIEW COMPARISON:  PA and lateral chest 06/13/2016 and 06/14/2016. FINDINGS: Right basilar airspace disease does not appear notably changed since the most recent exam. Minimal atelectasis is present in the left lung base. No pneumothorax or pleural effusion. Heart size is normal. IMPRESSION: No change in right basilar airspace disease since the most recent exam. Electronically Signed   By: Inge Rise M.D.   On: 06/17/2016 10:51   Dg Hip Unilat With Pelvis 2-3 Views Left  Result Date: 06/30/2016 CLINICAL DATA:  Fall, diffuse left hip pain EXAM: DG HIP (WITH OR WITHOUT PELVIS) 2-3V LEFT COMPARISON:  None available FINDINGS: Bones are osteopenic. There is an acute displaced and angulated left hip intertrochanteric fracture. No associated hip dislocation. Bony pelvis appears intact. Degenerative changes noted of the spine and SI joints. Right hip unremarkable. IMPRESSION: Acute displaced and angulated left hip intertrochanteric fracture. Degenerative changes and osteopenia Electronically Signed   By: Jerilynn Mages.  Shick M.D.   On: 06/30/2016 14:42   Dg Femur Min 2 Views Left  Result Date: 06/30/2016 CLINICAL DATA:  Fall off of wheelchair last night. Left femur pain. Initial encounter. EXAM: LEFT FEMUR 2 VIEWS COMPARISON:  Left hip  radiographs also obtained today FINDINGS: Comminuted intertrochanteric left hip fracture is seen on dedicated left hip radiographs. No distal femur fracture visualized on these views. Total knee arthroplasty noted as well as peripheral vascular calcification. IMPRESSION: No distal femur fracture identified. Comminuted intertrochanteric fracture of left hip demonstrated on separate left hip radiographs obtained today. Electronically Signed   By: Earle Gell M.D.   On: 06/30/2016 17:34    Discharge Exam: Vitals:   07/02/16 1934 07/03/16 0611  BP: (!) 112/55 (!) 111/44  Pulse: (!) 113 (!) 120  Resp:    Temp: 98.4 F (36.9 C) 98 F (36.7 C)   Vitals:   07/02/16 0316 07/02/16 0601 07/02/16 1934 07/03/16 0611  BP:  111/60 (!) 112/55 (!) 111/44  Pulse:  (!) 106 (!) 113 (!) 120  Resp:      Temp: 100 F (37.8 C) 98.8 F (37.1 C) 98.4 F (36.9 C) 98 F (36.7 C)  TempSrc:  Oral Oral Oral  SpO2:  99% 97% 100%    General: Pt is alert, awake, not in acute distress Cardiovascular: RRR, S1/S2 +, no rubs, no gallops Respiratory: CTA bilaterally, no wheezing, no rhonchi Abdominal: Soft, NT, ND, bowel sounds + Extremities: no edema, no cyanosis    The results of significant diagnostics from this hospitalization (including imaging, microbiology, ancillary and laboratory) are  listed below for reference.     Microbiology: Recent Results (from the past 240 hour(s))  Surgical PCR screen     Status: None   Collection Time: 06/30/16  9:42 PM  Result Value Ref Range Status   MRSA, PCR NEGATIVE NEGATIVE Final   Staphylococcus aureus NEGATIVE NEGATIVE Final    Comment:        The Xpert SA Assay (FDA approved for NASAL specimens in patients over 79 years of age), is one component of a comprehensive surveillance program.  Test performance has been validated by [facility] for patients greater than or equal to 22 year old. It is not intended to diagnose infection nor to guide or monitor  treatment.   Culture, blood (routine x 2)     Status: None (Preliminary result)   Collection Time: 07/02/16 12:35 PM  Result Value Ref Range Status   Specimen Description BLOOD BLOOD RIGHT HAND  Final   Special Requests IN PEDIATRIC BOTTLE 1CC  Final   Culture NO GROWTH < 24 HOURS  Final   Report Status PENDING  Incomplete  Culture, blood (routine x 2)     Status: None (Preliminary result)   Collection Time: 07/02/16 12:38 PM  Result Value Ref Range Status   Specimen Description BLOOD BLOOD RIGHT HAND  Final   Special Requests IN PEDIATRIC BOTTLE 4CC  Final   Culture NO GROWTH < 24 HOURS  Final   Report Status PENDING  Incomplete     Labs: BNP (last 3 results) No results for input(s): BNP in the last 8760 hours. Basic Metabolic Panel:  Recent Labs Lab 06/30/16 1441 06/30/16 2032 07/01/16 0645 07/01/16 1823 07/02/16 0554 07/03/16 0554  NA 137  --  138  --  133* 132*  K 3.9  --  4.5  --  4.6 4.5  CL 103  --  102  --  101 100*  CO2 24  --  27  --  25 21*  GLUCOSE 113*  --  113*  --  111* 90  BUN 26*  --  24*  --  22* 26*  CREATININE 1.74* 1.77* 1.78* 1.43* 1.78* 1.51*  CALCIUM 9.0  --  9.4  --  8.5* 8.9   Liver Function Tests: No results for input(s): AST, ALT, ALKPHOS, BILITOT, PROT, ALBUMIN in the last 168 hours. No results for input(s): LIPASE, AMYLASE in the last 168 hours. No results for input(s): AMMONIA in the last 168 hours. CBC:  Recent Labs Lab 06/30/16 1441 06/30/16 2032 07/01/16 0645 07/01/16 1823 07/02/16 0554 07/03/16 0554  WBC 6.5 8.6 6.5 8.1 6.3 8.4  NEUTROABS 5.2  --   --   --  4.9 6.7  HGB 9.9* 9.9* 9.7* 10.3* 8.5* 9.3*  HCT 29.0* 29.6* 29.7* 31.6* 25.1* 27.8*  MCV 89.8 90.0 92.2 92.1 89.6 89.1  PLT 173 157 141* 136* 121* 129*   Cardiac Enzymes: No results for input(s): CKTOTAL, CKMB, CKMBINDEX, TROPONINI in the last 168 hours. BNP: Invalid input(s): POCBNP CBG: No results for input(s): GLUCAP in the last 168 hours. D-Dimer No results  for input(s): DDIMER in the last 72 hours. Hgb A1c No results for input(s): HGBA1C in the last 72 hours. Lipid Profile No results for input(s): CHOL, HDL, LDLCALC, TRIG, CHOLHDL, LDLDIRECT in the last 72 hours. Thyroid function studies No results for input(s): TSH, T4TOTAL, T3FREE, THYROIDAB in the last 72 hours.  Invalid input(s): FREET3 Anemia work up No results for input(s): VITAMINB12, FOLATE, FERRITIN, TIBC, IRON, RETICCTPCT in  the last 72 hours. Urinalysis    Component Value Date/Time   COLORURINE AMBER (A) 06/13/2016 1212   APPEARANCEUR TURBID (A) 06/13/2016 1212   LABSPEC 1.020 06/13/2016 1212   PHURINE 5.0 06/13/2016 1212   GLUCOSEU NEGATIVE 06/13/2016 1212   HGBUR MODERATE (A) 06/13/2016 1212   BILIRUBINUR SMALL (A) 06/13/2016 1212   BILIRUBINUR n 12/02/2015 1535   KETONESUR NEGATIVE 06/13/2016 1212   PROTEINUR 100 (A) 06/13/2016 1212   UROBILINOGEN negative 12/02/2015 1535   UROBILINOGEN 0.2 02/17/2010 1259   NITRITE NEGATIVE 06/13/2016 1212   LEUKOCYTESUR SMALL (A) 06/13/2016 1212   Sepsis Labs Invalid input(s): PROCALCITONIN,  WBC,  LACTICIDVEN Microbiology Recent Results (from the past 240 hour(s))  Surgical PCR screen     Status: None   Collection Time: 06/30/16  9:42 PM  Result Value Ref Range Status   MRSA, PCR NEGATIVE NEGATIVE Final   Staphylococcus aureus NEGATIVE NEGATIVE Final    Comment:        The Xpert SA Assay (FDA approved for NASAL specimens in patients over 40 years of age), is one component of a comprehensive surveillance program.  Test performance has been validated by [facility] for patients greater than or equal to 56 year old. It is not intended to diagnose infection nor to guide or monitor treatment.   Culture, blood (routine x 2)     Status: None (Preliminary result)   Collection Time: 07/02/16 12:35 PM  Result Value Ref Range Status   Specimen Description BLOOD BLOOD RIGHT HAND  Final   Special Requests IN PEDIATRIC BOTTLE  1CC  Final   Culture NO GROWTH < 24 HOURS  Final   Report Status PENDING  Incomplete  Culture, blood (routine x 2)     Status: None (Preliminary result)   Collection Time: 07/02/16 12:38 PM  Result Value Ref Range Status   Specimen Description BLOOD BLOOD RIGHT HAND  Final   Special Requests IN PEDIATRIC BOTTLE 4CC  Final   Culture NO GROWTH < 24 HOURS  Final   Report Status PENDING  Incomplete     Time coordinating discharge: Over 30 minutes  SIGNED:  Dessa Phi, DO [facility] Pager [phone]  If 7PM-7AM, please contact night-coverage [email or web address] Password [facility] 07/03/2016, 9:36 AM

## 2016-07-03 NOTE — Progress Notes (Signed)
Called [facility]. Report given to [place]. Patient transported to facility via [facility]. No s/s of acute distress noted. Patient family present at bedside.

## 2016-07-03 NOTE — Anesthesia Postprocedure Evaluation (Addendum)
Anesthesia Post Note  Patient: Brian Montgomery  Procedure(s) Performed: Procedure(s) (LRB): INTRAMEDULLARY (IM) NAIL INTERTROCHANTERIC (Left)  Patient location during evaluation: PACU Anesthesia Type: General Level of consciousness: sedated and patient cooperative Pain management: pain level controlled Vital Signs Assessment: post-procedure vital signs reviewed and stable Respiratory status: spontaneous breathing Cardiovascular status: stable Anesthetic complications: no       Last Vitals:  Vitals:   07/02/16 1934 07/03/16 0611  BP: (!) 112/55 (!) 111/44  Pulse: (!) 113 (!) 120  Resp:    Temp: 36.9 C 36.7 C    Last Pain:  Vitals:   07/03/16 0611  TempSrc: Oral  PainSc:                  [name]

## 2016-07-03 NOTE — Progress Notes (Signed)
Subjective: 2 Days Post-Op Procedure(s) (LRB): INTRAMEDULLARY (IM) NAIL INTERTROCHANTERIC (Left)  Activity level:  wbat Diet tolerance:  kok Voiding:  ok Patient reports pain as mild.    Objective: Vital signs in last 24 hours: Temp:  [98 F (36.7 C)-98.4 F (36.9 C)] 98 F (36.7 C) (12/24 0611) Pulse Rate:  [113-120] 120 (12/24 0611) BP: (111-112)/(44-55) 111/44 (12/24 0611) SpO2:  [97 %-100 %] 100 % (12/24 0611)  Labs:  Recent Labs  06/30/16 2032 07/01/16 0645 07/01/16 1823 07/02/16 0554 07/03/16 0554  HGB 9.9* 9.7* 10.3* 8.5* 9.3*    Recent Labs  07/02/16 0554 07/03/16 0554  WBC 6.3 8.4  RBC 2.80* 3.12*  HCT 25.1* 27.8*  PLT 121* 129*    Recent Labs  07/02/16 0554 07/03/16 0554  NA 133* 132*  K 4.6 4.5  CL 101 100*  CO2 25 21*  BUN 22* 26*  CREATININE 1.78* 1.51*  GLUCOSE 111* 90  CALCIUM 8.5* 8.9    Recent Labs  06/30/16 1441 07/01/16 0645  INR 1.08 1.12    Physical Exam:  Neurologically intact ABD soft Neurovascular intact Sensation intact distally Intact pulses distally Dorsiflexion/Plantar flexion intact Incision: dressing C/D/I and no drainage No cellulitis present Compartment soft  Assessment/Plan:  2 Days Post-Op Procedure(s) (LRB): INTRAMEDULLARY (IM) NAIL INTERTROCHANTERIC (Left) Up with therapy Discharge to SNF [facility] today. Follow up with Dr. Tamera Punt 2 weeks post op. Continue on Lovenox x 4 weeks for DVT prevention.   Carols Clemence, Larwance Sachs 07/03/2016, 10:25 AM

## 2016-07-05 ENCOUNTER — Encounter (HOSPITAL_COMMUNITY): Payer: Self-pay | Admitting: Orthopedic Surgery

## 2016-07-05 LAB — TYPE AND SCREEN
ABO/RH(D): A POS
Antibody Screen: NEGATIVE
Unit division: 0
Unit division: 0

## 2016-07-06 ENCOUNTER — Other Ambulatory Visit: Payer: Self-pay | Admitting: *Deleted

## 2016-07-06 NOTE — Patient Outreach (Signed)
Spoke with Brian Montgomery, SW at facility, patient was at facility for Rehab and had a fall, now back at facility after hip surgery.  She reports patient discharge plan is to return home but with the new complication, no discharge date has been set and may have new needs identified closer to discharge.  Patient is married. Patient has history of bladder cancer, HTN, dementia, UTI and fall.  Plan: RNCM will follow along with case to monitor for any [facility] discharge planning needs.  Royetta Crochet. Laymond Purser, RN, BSN, [place] [facility] Care Coordinator [phone]

## 2016-07-07 LAB — CULTURE, BLOOD (ROUTINE X 2)
CULTURE: NO GROWTH
CULTURE: NO GROWTH

## 2016-07-08 DIAGNOSIS — S72145D Nondisplaced intertrochanteric fracture of left femur, subsequent encounter for closed fracture with routine healing: Secondary | ICD-10-CM | POA: Diagnosis not present

## 2016-07-08 DIAGNOSIS — E784 Other hyperlipidemia: Secondary | ICD-10-CM | POA: Diagnosis not present

## 2016-07-08 DIAGNOSIS — C679 Malignant neoplasm of bladder, unspecified: Secondary | ICD-10-CM | POA: Diagnosis not present

## 2016-07-08 DIAGNOSIS — W19XXXA Unspecified fall, initial encounter: Secondary | ICD-10-CM | POA: Diagnosis not present

## 2016-07-15 DIAGNOSIS — I10 Essential (primary) hypertension: Secondary | ICD-10-CM | POA: Diagnosis not present

## 2016-07-15 DIAGNOSIS — N179 Acute kidney failure, unspecified: Secondary | ICD-10-CM | POA: Diagnosis not present

## 2016-07-15 DIAGNOSIS — E46 Unspecified protein-calorie malnutrition: Secondary | ICD-10-CM | POA: Diagnosis not present

## 2016-07-15 DIAGNOSIS — S72142D Displaced intertrochanteric fracture of left femur, subsequent encounter for closed fracture with routine healing: Secondary | ICD-10-CM | POA: Diagnosis not present

## 2016-07-15 DIAGNOSIS — G20 Parkinson's disease: Secondary | ICD-10-CM | POA: Diagnosis not present

## 2016-07-15 DIAGNOSIS — S72001A Fracture of unspecified part of neck of right femur, initial encounter for closed fracture: Secondary | ICD-10-CM | POA: Diagnosis not present

## 2016-07-15 DIAGNOSIS — F0390 Unspecified dementia without behavioral disturbance: Secondary | ICD-10-CM | POA: Diagnosis not present

## 2016-07-15 DIAGNOSIS — C679 Malignant neoplasm of bladder, unspecified: Secondary | ICD-10-CM | POA: Diagnosis not present

## 2016-07-15 DIAGNOSIS — R2689 Other abnormalities of gait and mobility: Secondary | ICD-10-CM | POA: Diagnosis not present

## 2016-07-26 ENCOUNTER — Other Ambulatory Visit: Payer: Self-pay | Admitting: *Deleted

## 2016-07-26 NOTE — Patient Outreach (Signed)
[facility]) Care Management  07/26/2016  Brian Montgomery Apr 18, 1936 [id number]   Met with patient and his wife [name] at facility. She states that she is not sure how much longer patient will be at facility before discharge. She states patient is not back to baseline. He had the fall with hip fracture and it has set him back. He does have some upcoming appointments to follow up on his radiation and chemo and effect on bladder cancer. He is also seeing a neurologist as he may have a diagnosis of Parkinson's.  Reviewed [facility] program services and that we could provide care coordination upon discharge home. Patient wife accepted a brochure and magnet, but did not want to sign up at this time.   Plan to sign off but will be available for any discharge planning needs, made facility SW aware that patient is [facility] eligible and to consult RNCM as indicated by discharge planning needs, she agrees.  Royetta Crochet. Kaito Schulenburg, RN, BSN, [place] Acute Coordinator [phone]

## 2016-07-28 ENCOUNTER — Ambulatory Visit: Payer: Medicare Other | Admitting: Neurology

## 2016-07-29 DIAGNOSIS — I10 Essential (primary) hypertension: Secondary | ICD-10-CM | POA: Diagnosis not present

## 2016-07-29 DIAGNOSIS — E785 Hyperlipidemia, unspecified: Secondary | ICD-10-CM | POA: Diagnosis not present

## 2016-07-29 DIAGNOSIS — S72145D Nondisplaced intertrochanteric fracture of left femur, subsequent encounter for closed fracture with routine healing: Secondary | ICD-10-CM | POA: Diagnosis not present

## 2016-07-29 DIAGNOSIS — F0390 Unspecified dementia without behavioral disturbance: Secondary | ICD-10-CM | POA: Diagnosis not present

## 2016-08-02 ENCOUNTER — Other Ambulatory Visit: Payer: Self-pay | Admitting: *Deleted

## 2016-08-02 NOTE — Patient Outreach (Signed)
[facility]) Care Management  08/02/2016  Brian Montgomery [birth year] [id number]   Met with SW at facility, patient will be discharged 1/30.  He will go home with wife and he has 2 daughters that are very supportive.   Attempted to meet with patient and family but patient had just left for a dental visit to follow up on last weeks appointment.   Plan Requested SW remind patient and family about [facility], they have information.    Royetta Crochet. Laymond Purser, RN, BSN, [place] [phone]) Business Cell  [phone]) Toll Free Office

## 2016-08-04 DIAGNOSIS — W19XXXD Unspecified fall, subsequent encounter: Secondary | ICD-10-CM | POA: Diagnosis not present

## 2016-08-04 DIAGNOSIS — R2681 Unsteadiness on feet: Secondary | ICD-10-CM | POA: Diagnosis not present

## 2016-08-04 DIAGNOSIS — I10 Essential (primary) hypertension: Secondary | ICD-10-CM | POA: Diagnosis not present

## 2016-08-04 DIAGNOSIS — S72145D Nondisplaced intertrochanteric fracture of left femur, subsequent encounter for closed fracture with routine healing: Secondary | ICD-10-CM | POA: Diagnosis not present

## 2016-08-09 DIAGNOSIS — I10 Essential (primary) hypertension: Secondary | ICD-10-CM | POA: Diagnosis not present

## 2016-08-09 DIAGNOSIS — R2681 Unsteadiness on feet: Secondary | ICD-10-CM | POA: Diagnosis not present

## 2016-08-09 DIAGNOSIS — W19XXXA Unspecified fall, initial encounter: Secondary | ICD-10-CM | POA: Diagnosis not present

## 2016-08-09 DIAGNOSIS — F0390 Unspecified dementia without behavioral disturbance: Secondary | ICD-10-CM | POA: Diagnosis not present

## 2016-08-12 DIAGNOSIS — C679 Malignant neoplasm of bladder, unspecified: Secondary | ICD-10-CM | POA: Diagnosis not present

## 2016-08-12 DIAGNOSIS — N390 Urinary tract infection, site not specified: Secondary | ICD-10-CM | POA: Diagnosis not present

## 2016-08-12 DIAGNOSIS — F0391 Unspecified dementia with behavioral disturbance: Secondary | ICD-10-CM | POA: Diagnosis not present

## 2016-08-12 DIAGNOSIS — G20 Parkinson's disease: Secondary | ICD-10-CM | POA: Diagnosis not present

## 2016-08-12 DIAGNOSIS — R531 Weakness: Secondary | ICD-10-CM | POA: Diagnosis not present

## 2016-08-16 DIAGNOSIS — S72145D Nondisplaced intertrochanteric fracture of left femur, subsequent encounter for closed fracture with routine healing: Secondary | ICD-10-CM | POA: Diagnosis not present

## 2016-08-16 DIAGNOSIS — I10 Essential (primary) hypertension: Secondary | ICD-10-CM | POA: Diagnosis not present

## 2016-08-16 DIAGNOSIS — F0391 Unspecified dementia with behavioral disturbance: Secondary | ICD-10-CM | POA: Diagnosis not present

## 2016-08-16 DIAGNOSIS — C679 Malignant neoplasm of bladder, unspecified: Secondary | ICD-10-CM | POA: Diagnosis not present

## 2016-08-17 DIAGNOSIS — C61 Malignant neoplasm of prostate: Secondary | ICD-10-CM | POA: Diagnosis not present

## 2016-08-17 DIAGNOSIS — C678 Malignant neoplasm of overlapping sites of bladder: Secondary | ICD-10-CM | POA: Diagnosis not present

## 2016-08-19 DIAGNOSIS — S72142D Displaced intertrochanteric fracture of left femur, subsequent encounter for closed fracture with routine healing: Secondary | ICD-10-CM | POA: Diagnosis not present

## 2016-08-22 ENCOUNTER — Encounter: Payer: Self-pay | Admitting: *Deleted

## 2016-08-25 ENCOUNTER — Other Ambulatory Visit: Payer: Self-pay

## 2016-08-25 ENCOUNTER — Encounter: Payer: Self-pay | Admitting: Neurology

## 2016-08-25 ENCOUNTER — Ambulatory Visit (INDEPENDENT_AMBULATORY_CARE_PROVIDER_SITE_OTHER): Payer: Medicare Other | Admitting: Neurology

## 2016-08-25 VITALS — BP 124/82 | HR 94

## 2016-08-25 DIAGNOSIS — G231 Progressive supranuclear ophthalmoplegia [Steele-Richardson-Olszewski]: Secondary | ICD-10-CM | POA: Diagnosis not present

## 2016-08-25 DIAGNOSIS — F0281 Dementia in other diseases classified elsewhere with behavioral disturbance: Secondary | ICD-10-CM

## 2016-08-25 DIAGNOSIS — F32A Depression, unspecified: Secondary | ICD-10-CM

## 2016-08-25 DIAGNOSIS — F02818 Dementia in other diseases classified elsewhere, unspecified severity, with other behavioral disturbance: Secondary | ICD-10-CM

## 2016-08-25 DIAGNOSIS — F329 Major depressive disorder, single episode, unspecified: Secondary | ICD-10-CM

## 2016-08-25 DIAGNOSIS — F482 Pseudobulbar affect: Secondary | ICD-10-CM

## 2016-08-25 DIAGNOSIS — M79602 Pain in left arm: Secondary | ICD-10-CM

## 2016-08-25 MED ORDER — CITALOPRAM HYDROBROMIDE 10 MG PO TABS
10.0000 mg | ORAL_TABLET | Freq: Every day | ORAL | 5 refills | Status: DC
Start: 2016-08-25 — End: 2017-02-14

## 2016-08-25 MED ORDER — DONEPEZIL HCL 10 MG PO TABS: 10.0000 mg | ORAL_TABLET | Freq: Every day | ORAL | 5 refills | Status: DC

## 2016-08-25 NOTE — Patient Instructions (Addendum)
1.  For depression, we will start citalopam 10mg  daily. 2.  Continue melatonin.  Increase dose to two pills at bedtime 3.  Increase donepezil to 10mg  at bedtime. 4.  Continue carbidopa-levodopa, 1 tablet three times daily 5.  We will get a Home Health assessment to come to the home. 6.  We will order PT/OT of the left arm 7.  We will order speech and swallow therapy 8.  Follow up in 6 months.

## 2016-08-25 NOTE — Progress Notes (Signed)
NEUROLOGY FOLLOW UP OFFICE NOTE  Johnnye Sentz EH:[id number]  HISTORY OF PRESENT ILLNESS:  Alper Noel is a 81 year old right-handed man with hypertension, dyslipidemia, and arthritis who follows up for suspected PSP.  He is accompanied by his wife and daughters, who supplement history.  Hospital notes reviewed.   UPDATE: To evaluate left hand weakness and polyneuropathy, NCV-EMG was performed on 02/25/16, which demonstrated left median neuropathy at the wrist, consistent with carpal tunnel syndrome.  Left ulnar response showed prolonged latency in isolation, of unclear significance.  No polyneuropathy was appreciated.  He was referred to OT for left hand.  He continues to have pain in the upper arm and numbness in the hand.   In July, we started a trial of carbidopa-levodopa (25/100mg  three times daily).  His wife thinks it has helped a little bit, in terms of movement. Since last visit, he has been diagnosed with prostate and bladder cancer.  He underwent radiation.  He was also hospitalized for sepsis.  He was hospitalized from 06/30/16 to 07/03/16 for left hip fracture following a fall after he bent over to pick something up.  He underwent intramedullary fixation.  He is currently at [facility] and will be discharged home tomorrow.  He is now non-ambulatory since the hip fracture.  However, his gait continued to deteriorate prior to then.  As per his family, his memory seems a little worse.  He also cries very easily.  He has exhibited some sundowning in which he will try to get out of bed at night.  He appears to have some visual hallucinations.  At times, he may grab at the air.  He requires help with all ADLs, including bathing, dressing and using the toilet. He denies trouble swallowing but does cough sometimes and slurs his speech.  He denies double vision.  He denies lightheadedness.  He is taking Aricept 5mg  at bedtime, carbidopa-levodopa 25/100mg  three times daily, and melatonin 3mg  at  bedtime.  HISTORY: For about 2 years, he has had increased problems with balance.  When he gets up, he has to take a moment to maintain his balance.  He sometimes feels has to push off.  When he is walking, he feels like his body is being pulled to either side.  There is no spinning sensation, lightheadedness, nausea, vomiting, slurred speech, visual disturbance or numbness or weakness.  He denies neck pain, low back pain or pain in the legs when he walks or stands.  He has longstanding problems with hearing but no tinnitus.  Orthostatics were checked, which were negative.     His daughters do not notice any significant memory problems.  His wife pays the bills.  He is able to perform all of his ADLs.  He does have some urinary frequency but has BPH.  He does have symptoms suggestive of REM sleep behavior disorder (he will scream and thrash around in his sleep, one time falling out of bed).  He denies tremor.  MoCA score from 07/27/15 was 10.   MRI of the brain from 10/27/14 showed mild to moderate chronic small vessel disease and mild global atrophy.    MRI of cervical spine from 09/01/15 showed multilevel spondylosis and mild spinal stenosis without cord deformity or signal abnormality.  He was referred again to PT.  He continues to fall and had recent fall on 11/14/15, which required an ED visit.  He fell on his left side/shoulder.  He had dislocated his shoulder but was able to pop  it back in.  X-ray of left shoulder in the ED revealed osteoarthritic changes but no fracture or dislocation.  Since the fall, he has had numbness and weakness of his left hand.  He notes pain in the shoulder but denies neck pain. Labs, including TSH, glucose, ANA, Sed Rate, B12, RPR, SPEP/IFE were normal.    PAST MEDICAL HISTORY: Past Medical History:  Diagnosis Date  . Arthritis   . Bladder cancer ([place])    infiltrative high grade papillary urothelial carcinoma   . Dyslipidemia   . ED (erectile dysfunction)   .  Hypertension   . Palsy ([place])    super nuclear palsy managed by [name]  . Parkinson disease [facility])     MEDICATIONS: Current Outpatient Prescriptions on File Prior to Visit  Medication Sig Dispense Refill  . acetaminophen (TYLENOL) 325 MG tablet Take 650 mg by mouth every 6 (six) hours as needed for moderate pain.    . benzonatate (TESSALON) 100 MG capsule Take 1 capsule (100 mg total) by mouth 3 (three) times daily as needed for cough. 20 capsule 0  . carbidopa-levodopa (SINEMET IR) 25-100 MG tablet TAKE 1/2 TABLET BY MOUTH TWICE DAILY FOR 3 DAYS, 1/2 TABLET THREE TIMES DAILY FOR 3 DAYS, THEN 1 TABLET THREE TIMES DAILY (Patient taking differently: TAKE 1 TABLET BY MOUTH Three times DAILY) 90 tablet 3  . enoxaparin (LOVENOX) 30 MG/0.3ML injection Inject 0.3 mLs (30 mg total) into the skin daily. 30 Syringe 0  . HYDROcodone-acetaminophen (NORCO/VICODIN) 5-325 MG tablet Take 1-2 tablets by mouth every 6 (six) hours as needed for moderate pain. 30 tablet 0  . lisinopril-hydrochlorothiazide (PRINZIDE,ZESTORETIC) 10-12.5 MG tablet Take 1 tablet by mouth daily. 90 tablet 3  . loperamide (IMODIUM) 2 MG capsule Take 1 capsule (2 mg total) by mouth as needed for diarrhea or loose stools. (Patient taking differently: Take 2 mg by mouth 3 (three) times daily as needed for diarrhea or loose stools. ) 30 capsule 0  . phenazopyridine (PYRIDIUM) 100 MG tablet Take 1 tablet (100 mg total) by mouth 3 (three) times daily as needed for pain (for burning). 20 tablet 0  . saccharomyces boulardii (FLORASTOR) 250 MG capsule Take 1 capsule (250 mg total) by mouth 2 (two) times daily. 30 capsule 0  . simvastatin (ZOCOR) 20 MG tablet TAKE 1 TABLET BY MOUTH DAILY (Patient taking differently: TAKE 20mg  TABLET BY MOUTH in the evening at 6pm) 90 tablet 0   No current facility-administered medications on file prior to visit.     ALLERGIES: No Known Allergies  FAMILY HISTORY: Family History  Problem Relation Age of Onset    . Heart failure Mother   . Hypertension Mother   . Heart failure Father   . Hypertension Father   . Renal Disease Sister   . Diabetes Brother     SOCIAL HISTORY: Social History   Social History  . Marital status: Married    Spouse name: N/A  . Number of children: N/A  . Years of education: N/A   Occupational History  . Not on file.   Social History Main Topics  . Smoking status: Former Smoker    Packs/day: 0.25    Years: 2.00    Types: Cigarettes    Quit date: 07/12/1967  . Smokeless tobacco: Never Used  . Alcohol use 0.0 oz/week     Comment: occasional beer or wine   . Drug use: No  . Sexual activity: Yes    Partners: Female   Other Topics  Concern  . Not on file   Social History Narrative  . No narrative on file    REVIEW OF SYSTEMS: Constitutional: No fevers, chills, or sweats, no generalized fatigue, change in appetite Eyes: No visual changes, double vision, eye pain Ear, nose and throat: No hearing loss, ear pain, nasal congestion, sore throat Cardiovascular: No chest pain, palpitations Respiratory:  No shortness of breath at rest or with exertion, wheezes GastrointestinaI: No nausea, vomiting, diarrhea, abdominal pain, fecal incontinence Genitourinary:  No dysuria, urinary retention or frequency Musculoskeletal:  Hip pain, arm pain Integumentary: No rash, pruritus, skin lesions Neurological: as above Psychiatric: depression, anxiety Endocrine: No palpitations, fatigue, diaphoresis, mood swings, change in appetite, change in weight, increased thirst Hematologic/Lymphatic:  No purpura, petechiae. Allergic/Immunologic: no itchy/runny eyes, nasal congestion, recent allergic reactions, rashes  PHYSICAL EXAM: Vitals:   08/25/16 1127  BP: 124/82  Pulse: 94   General: No acute distress but easily cries.  Patient appears well-groomed.   Head:  Normocephalic/atraumatic Eyes:  Fundi examined but not visualized Neck: supple, no paraspinal tenderness, full  range of motion Heart:  Regular rate and rhythm Lungs:  Clear to auscultation bilaterally Back: No paraspinal tenderness Neurological Exam: alert and oriented to person and place but not time. Attention span and concentration impaired, recent memory poor, remote memory intact, fund of knowledge impaired.  Speech fluent and not dysarthric, language intact.   Decreased upward and downward gaze on tracking.  Otherwise, CN II-XII intact. Bulk and tone normal, 3+ left deltoid and 5- left hand grip.  3+ in lower extremities (except left leg not tested).  Otherwise, muscle strength 5/5 throughout.  Decreased finger-thumb tapping speed and amplitude.  No cogwheel rigidity or tremor.  Deep tendon reflexes 2+ throughout.  Finger to nose testing intact.    IMPRESSION: 1.  Progressive supranuclear palsy 2.  Pseudobulbar affect  3.  Depression 4.  Dementia 5.  Left upper extremity pain and hand numbness.  Possible neuropraxia from fall, as well as underlying carpal tunnel syndrome.  PLAN: 1.  To address depression, will start citalopram 10mg  daily. 2.  Increase donepezil to 10mg  at bedtime 3.  Continue carbidopa-levodopa 25/100mg  three times daily 4.  Refer to PT/OT for left upper extremity pain 5.  Speech and swallow evaluation and therapy. 6.  Increase melatonin to 6mg  at bedtime 7.  I don't think that Mrs. Ulrey can care for her husband alone.  She will definitely need to have an aid in the house, if not higher level of care.  We will get a Home Health assessment to evaluate and make recommendations. 8.  Follow up in 6 months.  28 minutes spent face to face with patient, over 50% spent discussing diagnosis, prognosis, treatment and home safety/help for his wife.Metta Clines, DO  CC:  Jill Alexanders, MD

## 2016-08-26 ENCOUNTER — Telehealth: Payer: Self-pay

## 2016-08-26 NOTE — Telephone Encounter (Signed)
Followed up w/ family. Spoke to daughter [name]. She states [name] has contacted them and will try to be out w/in 48 hours.

## 2016-08-28 DIAGNOSIS — C670 Malignant neoplasm of trigone of bladder: Secondary | ICD-10-CM | POA: Diagnosis not present

## 2016-08-28 DIAGNOSIS — G20 Parkinson's disease: Secondary | ICD-10-CM | POA: Diagnosis not present

## 2016-08-28 DIAGNOSIS — G629 Polyneuropathy, unspecified: Secondary | ICD-10-CM | POA: Diagnosis not present

## 2016-08-28 DIAGNOSIS — R1313 Dysphagia, pharyngeal phase: Secondary | ICD-10-CM | POA: Diagnosis not present

## 2016-08-28 DIAGNOSIS — F0391 Unspecified dementia with behavioral disturbance: Secondary | ICD-10-CM | POA: Diagnosis not present

## 2016-08-28 DIAGNOSIS — F329 Major depressive disorder, single episode, unspecified: Secondary | ICD-10-CM | POA: Diagnosis not present

## 2016-08-28 DIAGNOSIS — G231 Progressive supranuclear ophthalmoplegia [Steele-Richardson-Olszewski]: Secondary | ICD-10-CM | POA: Diagnosis not present

## 2016-08-28 DIAGNOSIS — Z9181 History of falling: Secondary | ICD-10-CM | POA: Diagnosis not present

## 2016-08-28 DIAGNOSIS — C61 Malignant neoplasm of prostate: Secondary | ICD-10-CM | POA: Diagnosis not present

## 2016-08-28 DIAGNOSIS — M4302 Spondylolysis, cervical region: Secondary | ICD-10-CM | POA: Diagnosis not present

## 2016-08-28 DIAGNOSIS — I10 Essential (primary) hypertension: Secondary | ICD-10-CM | POA: Diagnosis not present

## 2016-08-28 DIAGNOSIS — Z8781 Personal history of (healed) traumatic fracture: Secondary | ICD-10-CM | POA: Diagnosis not present

## 2016-08-29 ENCOUNTER — Telehealth: Payer: Self-pay | Admitting: Neurology

## 2016-08-29 DIAGNOSIS — F0391 Unspecified dementia with behavioral disturbance: Secondary | ICD-10-CM | POA: Diagnosis not present

## 2016-08-29 DIAGNOSIS — F329 Major depressive disorder, single episode, unspecified: Secondary | ICD-10-CM | POA: Diagnosis not present

## 2016-08-29 DIAGNOSIS — G231 Progressive supranuclear ophthalmoplegia [Steele-Richardson-Olszewski]: Secondary | ICD-10-CM | POA: Diagnosis not present

## 2016-08-29 DIAGNOSIS — G20 Parkinson's disease: Secondary | ICD-10-CM | POA: Diagnosis not present

## 2016-08-29 DIAGNOSIS — I10 Essential (primary) hypertension: Secondary | ICD-10-CM | POA: Diagnosis not present

## 2016-08-29 DIAGNOSIS — R1313 Dysphagia, pharyngeal phase: Secondary | ICD-10-CM | POA: Diagnosis not present

## 2016-08-29 NOTE — Telephone Encounter (Signed)
Advised patient to call PCP office for DNR form.

## 2016-08-29 NOTE — Telephone Encounter (Signed)
Brian Montgomery with [name] would like verbal orders for skilled nursing 2 times a week for three weeks, Home Health 2 times a week for 3 weeks and medical social worker for eval.  Please call [phone] and leave a message if Brian Montgomery does not answer.

## 2016-08-29 NOTE — Telephone Encounter (Signed)
LVM with verbal 

## 2016-08-29 NOTE — Telephone Encounter (Signed)
Patient daughter Brian Montgomery called and states that the nursing home state that they need a DNR posted over patient bed and they need to get that from [place] and she was calling to talk to someone about this please call her at [phone]

## 2016-08-30 DIAGNOSIS — R1313 Dysphagia, pharyngeal phase: Secondary | ICD-10-CM | POA: Diagnosis not present

## 2016-08-30 DIAGNOSIS — G20 Parkinson's disease: Secondary | ICD-10-CM | POA: Diagnosis not present

## 2016-08-30 DIAGNOSIS — I10 Essential (primary) hypertension: Secondary | ICD-10-CM | POA: Diagnosis not present

## 2016-08-30 DIAGNOSIS — G231 Progressive supranuclear ophthalmoplegia [Steele-Richardson-Olszewski]: Secondary | ICD-10-CM | POA: Diagnosis not present

## 2016-08-30 DIAGNOSIS — F0391 Unspecified dementia with behavioral disturbance: Secondary | ICD-10-CM | POA: Diagnosis not present

## 2016-08-30 DIAGNOSIS — F329 Major depressive disorder, single episode, unspecified: Secondary | ICD-10-CM | POA: Diagnosis not present

## 2016-08-31 DIAGNOSIS — G20 Parkinson's disease: Secondary | ICD-10-CM | POA: Diagnosis not present

## 2016-08-31 DIAGNOSIS — G231 Progressive supranuclear ophthalmoplegia [Steele-Richardson-Olszewski]: Secondary | ICD-10-CM | POA: Diagnosis not present

## 2016-08-31 DIAGNOSIS — F0391 Unspecified dementia with behavioral disturbance: Secondary | ICD-10-CM | POA: Diagnosis not present

## 2016-08-31 DIAGNOSIS — I10 Essential (primary) hypertension: Secondary | ICD-10-CM | POA: Diagnosis not present

## 2016-08-31 DIAGNOSIS — F329 Major depressive disorder, single episode, unspecified: Secondary | ICD-10-CM | POA: Diagnosis not present

## 2016-08-31 DIAGNOSIS — R1313 Dysphagia, pharyngeal phase: Secondary | ICD-10-CM | POA: Diagnosis not present

## 2016-09-01 DIAGNOSIS — G231 Progressive supranuclear ophthalmoplegia [Steele-Richardson-Olszewski]: Secondary | ICD-10-CM | POA: Diagnosis not present

## 2016-09-01 DIAGNOSIS — F329 Major depressive disorder, single episode, unspecified: Secondary | ICD-10-CM | POA: Diagnosis not present

## 2016-09-01 DIAGNOSIS — I10 Essential (primary) hypertension: Secondary | ICD-10-CM | POA: Diagnosis not present

## 2016-09-01 DIAGNOSIS — G20 Parkinson's disease: Secondary | ICD-10-CM | POA: Diagnosis not present

## 2016-09-01 DIAGNOSIS — F0391 Unspecified dementia with behavioral disturbance: Secondary | ICD-10-CM | POA: Diagnosis not present

## 2016-09-01 DIAGNOSIS — R1313 Dysphagia, pharyngeal phase: Secondary | ICD-10-CM | POA: Diagnosis not present

## 2016-09-02 ENCOUNTER — Telehealth: Payer: Self-pay

## 2016-09-02 DIAGNOSIS — R1313 Dysphagia, pharyngeal phase: Secondary | ICD-10-CM | POA: Diagnosis not present

## 2016-09-02 DIAGNOSIS — F0391 Unspecified dementia with behavioral disturbance: Secondary | ICD-10-CM | POA: Diagnosis not present

## 2016-09-02 DIAGNOSIS — F329 Major depressive disorder, single episode, unspecified: Secondary | ICD-10-CM | POA: Diagnosis not present

## 2016-09-02 DIAGNOSIS — I10 Essential (primary) hypertension: Secondary | ICD-10-CM | POA: Diagnosis not present

## 2016-09-02 DIAGNOSIS — G20 Parkinson's disease: Secondary | ICD-10-CM | POA: Diagnosis not present

## 2016-09-02 DIAGNOSIS — G231 Progressive supranuclear ophthalmoplegia [Steele-Richardson-Olszewski]: Secondary | ICD-10-CM | POA: Diagnosis not present

## 2016-09-02 NOTE — Telephone Encounter (Signed)
Received fax from [facility] stating Ernst Bowler from [facility] is requesting VO for completion of occupational therapist evaluation. Left detailed message given VO.

## 2016-09-05 ENCOUNTER — Telehealth: Payer: Self-pay | Admitting: Neurology

## 2016-09-05 DIAGNOSIS — G20 Parkinson's disease: Secondary | ICD-10-CM | POA: Diagnosis not present

## 2016-09-05 DIAGNOSIS — R1313 Dysphagia, pharyngeal phase: Secondary | ICD-10-CM | POA: Diagnosis not present

## 2016-09-05 DIAGNOSIS — G231 Progressive supranuclear ophthalmoplegia [Steele-Richardson-Olszewski]: Secondary | ICD-10-CM | POA: Diagnosis not present

## 2016-09-05 DIAGNOSIS — I10 Essential (primary) hypertension: Secondary | ICD-10-CM | POA: Diagnosis not present

## 2016-09-05 DIAGNOSIS — F329 Major depressive disorder, single episode, unspecified: Secondary | ICD-10-CM | POA: Diagnosis not present

## 2016-09-05 DIAGNOSIS — F0391 Unspecified dementia with behavioral disturbance: Secondary | ICD-10-CM | POA: Diagnosis not present

## 2016-09-05 MED ORDER — FOAM CUSHION MISC: 0 refills | Status: DC

## 2016-09-05 NOTE — Telephone Encounter (Signed)
Order faxed to number provided with confirmation.

## 2016-09-05 NOTE — Telephone Encounter (Signed)
-----   Message from Elenora Fender sent at 09/05/2016 11:45 AM EST ----- Regarding: Wheel Cahir cushion Contact: [phone] Hi Candy Sledge from [facility] called regarding this patient who is a patient of Dr. [name].  They would like an order faxed [phone]) to them for a wheel chair cushion. She said the order can be faxed to the home health office and the therapy office can order.   Thank you Theadora Rama

## 2016-09-06 DIAGNOSIS — F0391 Unspecified dementia with behavioral disturbance: Secondary | ICD-10-CM | POA: Diagnosis not present

## 2016-09-06 DIAGNOSIS — G231 Progressive supranuclear ophthalmoplegia [Steele-Richardson-Olszewski]: Secondary | ICD-10-CM | POA: Diagnosis not present

## 2016-09-06 DIAGNOSIS — G20 Parkinson's disease: Secondary | ICD-10-CM | POA: Diagnosis not present

## 2016-09-06 DIAGNOSIS — I10 Essential (primary) hypertension: Secondary | ICD-10-CM | POA: Diagnosis not present

## 2016-09-06 DIAGNOSIS — F329 Major depressive disorder, single episode, unspecified: Secondary | ICD-10-CM | POA: Diagnosis not present

## 2016-09-06 DIAGNOSIS — R1313 Dysphagia, pharyngeal phase: Secondary | ICD-10-CM | POA: Diagnosis not present

## 2016-09-07 ENCOUNTER — Other Ambulatory Visit: Payer: Self-pay | Admitting: Urology

## 2016-09-07 DIAGNOSIS — F329 Major depressive disorder, single episode, unspecified: Secondary | ICD-10-CM | POA: Diagnosis not present

## 2016-09-07 DIAGNOSIS — G231 Progressive supranuclear ophthalmoplegia [Steele-Richardson-Olszewski]: Secondary | ICD-10-CM | POA: Diagnosis not present

## 2016-09-07 DIAGNOSIS — R1313 Dysphagia, pharyngeal phase: Secondary | ICD-10-CM | POA: Diagnosis not present

## 2016-09-07 DIAGNOSIS — F0391 Unspecified dementia with behavioral disturbance: Secondary | ICD-10-CM | POA: Diagnosis not present

## 2016-09-07 DIAGNOSIS — I10 Essential (primary) hypertension: Secondary | ICD-10-CM | POA: Diagnosis not present

## 2016-09-07 DIAGNOSIS — G20 Parkinson's disease: Secondary | ICD-10-CM | POA: Diagnosis not present

## 2016-09-08 DIAGNOSIS — G20 Parkinson's disease: Secondary | ICD-10-CM | POA: Diagnosis not present

## 2016-09-08 DIAGNOSIS — F329 Major depressive disorder, single episode, unspecified: Secondary | ICD-10-CM | POA: Diagnosis not present

## 2016-09-08 DIAGNOSIS — F0391 Unspecified dementia with behavioral disturbance: Secondary | ICD-10-CM | POA: Diagnosis not present

## 2016-09-08 DIAGNOSIS — I10 Essential (primary) hypertension: Secondary | ICD-10-CM | POA: Diagnosis not present

## 2016-09-08 DIAGNOSIS — R1313 Dysphagia, pharyngeal phase: Secondary | ICD-10-CM | POA: Diagnosis not present

## 2016-09-08 DIAGNOSIS — G231 Progressive supranuclear ophthalmoplegia [Steele-Richardson-Olszewski]: Secondary | ICD-10-CM | POA: Diagnosis not present

## 2016-09-09 DIAGNOSIS — F329 Major depressive disorder, single episode, unspecified: Secondary | ICD-10-CM | POA: Diagnosis not present

## 2016-09-09 DIAGNOSIS — G20 Parkinson's disease: Secondary | ICD-10-CM | POA: Diagnosis not present

## 2016-09-09 DIAGNOSIS — R1313 Dysphagia, pharyngeal phase: Secondary | ICD-10-CM | POA: Diagnosis not present

## 2016-09-09 DIAGNOSIS — F0391 Unspecified dementia with behavioral disturbance: Secondary | ICD-10-CM | POA: Diagnosis not present

## 2016-09-09 DIAGNOSIS — I10 Essential (primary) hypertension: Secondary | ICD-10-CM | POA: Diagnosis not present

## 2016-09-09 DIAGNOSIS — G231 Progressive supranuclear ophthalmoplegia [Steele-Richardson-Olszewski]: Secondary | ICD-10-CM | POA: Diagnosis not present

## 2016-09-12 DIAGNOSIS — I10 Essential (primary) hypertension: Secondary | ICD-10-CM | POA: Diagnosis not present

## 2016-09-12 DIAGNOSIS — G231 Progressive supranuclear ophthalmoplegia [Steele-Richardson-Olszewski]: Secondary | ICD-10-CM | POA: Diagnosis not present

## 2016-09-12 DIAGNOSIS — G20 Parkinson's disease: Secondary | ICD-10-CM | POA: Diagnosis not present

## 2016-09-12 DIAGNOSIS — R1313 Dysphagia, pharyngeal phase: Secondary | ICD-10-CM | POA: Diagnosis not present

## 2016-09-12 DIAGNOSIS — F329 Major depressive disorder, single episode, unspecified: Secondary | ICD-10-CM | POA: Diagnosis not present

## 2016-09-12 DIAGNOSIS — F0391 Unspecified dementia with behavioral disturbance: Secondary | ICD-10-CM | POA: Diagnosis not present

## 2016-09-13 DIAGNOSIS — I10 Essential (primary) hypertension: Secondary | ICD-10-CM | POA: Diagnosis not present

## 2016-09-13 DIAGNOSIS — R1313 Dysphagia, pharyngeal phase: Secondary | ICD-10-CM | POA: Diagnosis not present

## 2016-09-13 DIAGNOSIS — F329 Major depressive disorder, single episode, unspecified: Secondary | ICD-10-CM | POA: Diagnosis not present

## 2016-09-13 DIAGNOSIS — G231 Progressive supranuclear ophthalmoplegia [Steele-Richardson-Olszewski]: Secondary | ICD-10-CM | POA: Diagnosis not present

## 2016-09-13 DIAGNOSIS — G20 Parkinson's disease: Secondary | ICD-10-CM | POA: Diagnosis not present

## 2016-09-13 DIAGNOSIS — F0391 Unspecified dementia with behavioral disturbance: Secondary | ICD-10-CM | POA: Diagnosis not present

## 2016-09-14 DIAGNOSIS — G20 Parkinson's disease: Secondary | ICD-10-CM | POA: Diagnosis not present

## 2016-09-14 DIAGNOSIS — F329 Major depressive disorder, single episode, unspecified: Secondary | ICD-10-CM | POA: Diagnosis not present

## 2016-09-14 DIAGNOSIS — G231 Progressive supranuclear ophthalmoplegia [Steele-Richardson-Olszewski]: Secondary | ICD-10-CM | POA: Diagnosis not present

## 2016-09-14 DIAGNOSIS — F0391 Unspecified dementia with behavioral disturbance: Secondary | ICD-10-CM | POA: Diagnosis not present

## 2016-09-14 DIAGNOSIS — I10 Essential (primary) hypertension: Secondary | ICD-10-CM | POA: Diagnosis not present

## 2016-09-14 DIAGNOSIS — R1313 Dysphagia, pharyngeal phase: Secondary | ICD-10-CM | POA: Diagnosis not present

## 2016-09-15 DIAGNOSIS — G20 Parkinson's disease: Secondary | ICD-10-CM | POA: Diagnosis not present

## 2016-09-15 DIAGNOSIS — F329 Major depressive disorder, single episode, unspecified: Secondary | ICD-10-CM | POA: Diagnosis not present

## 2016-09-15 DIAGNOSIS — I10 Essential (primary) hypertension: Secondary | ICD-10-CM | POA: Diagnosis not present

## 2016-09-15 DIAGNOSIS — F0391 Unspecified dementia with behavioral disturbance: Secondary | ICD-10-CM | POA: Diagnosis not present

## 2016-09-15 DIAGNOSIS — R1313 Dysphagia, pharyngeal phase: Secondary | ICD-10-CM | POA: Diagnosis not present

## 2016-09-15 DIAGNOSIS — G231 Progressive supranuclear ophthalmoplegia [Steele-Richardson-Olszewski]: Secondary | ICD-10-CM | POA: Diagnosis not present

## 2016-09-16 DIAGNOSIS — G20 Parkinson's disease: Secondary | ICD-10-CM | POA: Diagnosis not present

## 2016-09-16 DIAGNOSIS — G231 Progressive supranuclear ophthalmoplegia [Steele-Richardson-Olszewski]: Secondary | ICD-10-CM | POA: Diagnosis not present

## 2016-09-16 DIAGNOSIS — F0391 Unspecified dementia with behavioral disturbance: Secondary | ICD-10-CM | POA: Diagnosis not present

## 2016-09-16 DIAGNOSIS — F329 Major depressive disorder, single episode, unspecified: Secondary | ICD-10-CM | POA: Diagnosis not present

## 2016-09-16 DIAGNOSIS — I10 Essential (primary) hypertension: Secondary | ICD-10-CM | POA: Diagnosis not present

## 2016-09-16 DIAGNOSIS — R1313 Dysphagia, pharyngeal phase: Secondary | ICD-10-CM | POA: Diagnosis not present

## 2016-09-21 ENCOUNTER — Telehealth: Payer: Self-pay

## 2016-09-21 DIAGNOSIS — F329 Major depressive disorder, single episode, unspecified: Secondary | ICD-10-CM | POA: Diagnosis not present

## 2016-09-21 DIAGNOSIS — F0391 Unspecified dementia with behavioral disturbance: Secondary | ICD-10-CM | POA: Diagnosis not present

## 2016-09-21 DIAGNOSIS — G231 Progressive supranuclear ophthalmoplegia [Steele-Richardson-Olszewski]: Secondary | ICD-10-CM | POA: Diagnosis not present

## 2016-09-21 DIAGNOSIS — G20 Parkinson's disease: Secondary | ICD-10-CM | POA: Diagnosis not present

## 2016-09-21 DIAGNOSIS — R1313 Dysphagia, pharyngeal phase: Secondary | ICD-10-CM | POA: Diagnosis not present

## 2016-09-21 DIAGNOSIS — I10 Essential (primary) hypertension: Secondary | ICD-10-CM | POA: Diagnosis not present

## 2016-09-21 NOTE — Telephone Encounter (Signed)
[name] OT from [facility] called requesting VO to continue and increase therapy to 2 times weekly. VO given.

## 2016-09-22 DIAGNOSIS — F0391 Unspecified dementia with behavioral disturbance: Secondary | ICD-10-CM | POA: Diagnosis not present

## 2016-09-22 DIAGNOSIS — G20 Parkinson's disease: Secondary | ICD-10-CM | POA: Diagnosis not present

## 2016-09-22 DIAGNOSIS — I10 Essential (primary) hypertension: Secondary | ICD-10-CM | POA: Diagnosis not present

## 2016-09-22 DIAGNOSIS — F329 Major depressive disorder, single episode, unspecified: Secondary | ICD-10-CM | POA: Diagnosis not present

## 2016-09-22 DIAGNOSIS — G231 Progressive supranuclear ophthalmoplegia [Steele-Richardson-Olszewski]: Secondary | ICD-10-CM | POA: Diagnosis not present

## 2016-09-22 DIAGNOSIS — R1313 Dysphagia, pharyngeal phase: Secondary | ICD-10-CM | POA: Diagnosis not present

## 2016-09-23 DIAGNOSIS — R1313 Dysphagia, pharyngeal phase: Secondary | ICD-10-CM | POA: Diagnosis not present

## 2016-09-23 DIAGNOSIS — F329 Major depressive disorder, single episode, unspecified: Secondary | ICD-10-CM | POA: Diagnosis not present

## 2016-09-23 DIAGNOSIS — F0391 Unspecified dementia with behavioral disturbance: Secondary | ICD-10-CM | POA: Diagnosis not present

## 2016-09-23 DIAGNOSIS — G20 Parkinson's disease: Secondary | ICD-10-CM | POA: Diagnosis not present

## 2016-09-23 DIAGNOSIS — G231 Progressive supranuclear ophthalmoplegia [Steele-Richardson-Olszewski]: Secondary | ICD-10-CM | POA: Diagnosis not present

## 2016-09-23 DIAGNOSIS — I10 Essential (primary) hypertension: Secondary | ICD-10-CM | POA: Diagnosis not present

## 2016-09-26 DIAGNOSIS — I10 Essential (primary) hypertension: Secondary | ICD-10-CM | POA: Diagnosis not present

## 2016-09-26 DIAGNOSIS — G20 Parkinson's disease: Secondary | ICD-10-CM | POA: Diagnosis not present

## 2016-09-26 DIAGNOSIS — R1313 Dysphagia, pharyngeal phase: Secondary | ICD-10-CM | POA: Diagnosis not present

## 2016-09-26 DIAGNOSIS — F329 Major depressive disorder, single episode, unspecified: Secondary | ICD-10-CM | POA: Diagnosis not present

## 2016-09-26 DIAGNOSIS — F0391 Unspecified dementia with behavioral disturbance: Secondary | ICD-10-CM | POA: Diagnosis not present

## 2016-09-26 DIAGNOSIS — G231 Progressive supranuclear ophthalmoplegia [Steele-Richardson-Olszewski]: Secondary | ICD-10-CM | POA: Diagnosis not present

## 2016-09-27 ENCOUNTER — Telehealth: Payer: Self-pay

## 2016-09-27 DIAGNOSIS — F329 Major depressive disorder, single episode, unspecified: Secondary | ICD-10-CM | POA: Diagnosis not present

## 2016-09-27 DIAGNOSIS — G20 Parkinson's disease: Secondary | ICD-10-CM | POA: Diagnosis not present

## 2016-09-27 DIAGNOSIS — I10 Essential (primary) hypertension: Secondary | ICD-10-CM | POA: Diagnosis not present

## 2016-09-27 DIAGNOSIS — F0391 Unspecified dementia with behavioral disturbance: Secondary | ICD-10-CM | POA: Diagnosis not present

## 2016-09-27 DIAGNOSIS — R1313 Dysphagia, pharyngeal phase: Secondary | ICD-10-CM | POA: Diagnosis not present

## 2016-09-27 DIAGNOSIS — G231 Progressive supranuclear ophthalmoplegia [Steele-Richardson-Olszewski]: Secondary | ICD-10-CM | POA: Diagnosis not present

## 2016-09-27 NOTE — Telephone Encounter (Signed)
Dghter left vmail requesting PT extended. Returned call. Spoke to patient spouse. She states this week is the last week for CNA, OT, & PT. She feels like pt is still in need of services. Says she is unable to handle pt on her own. Advised to speak w/ [facility] about getting services extended. Spouse agreed.

## 2016-09-28 DIAGNOSIS — G20 Parkinson's disease: Secondary | ICD-10-CM | POA: Diagnosis not present

## 2016-09-28 DIAGNOSIS — F0391 Unspecified dementia with behavioral disturbance: Secondary | ICD-10-CM | POA: Diagnosis not present

## 2016-09-28 DIAGNOSIS — G231 Progressive supranuclear ophthalmoplegia [Steele-Richardson-Olszewski]: Secondary | ICD-10-CM | POA: Diagnosis not present

## 2016-09-28 DIAGNOSIS — F329 Major depressive disorder, single episode, unspecified: Secondary | ICD-10-CM | POA: Diagnosis not present

## 2016-09-28 DIAGNOSIS — R1313 Dysphagia, pharyngeal phase: Secondary | ICD-10-CM | POA: Diagnosis not present

## 2016-09-28 DIAGNOSIS — I10 Essential (primary) hypertension: Secondary | ICD-10-CM | POA: Diagnosis not present

## 2016-09-29 DIAGNOSIS — R1313 Dysphagia, pharyngeal phase: Secondary | ICD-10-CM | POA: Diagnosis not present

## 2016-09-29 DIAGNOSIS — F329 Major depressive disorder, single episode, unspecified: Secondary | ICD-10-CM | POA: Diagnosis not present

## 2016-09-29 DIAGNOSIS — F0391 Unspecified dementia with behavioral disturbance: Secondary | ICD-10-CM | POA: Diagnosis not present

## 2016-09-29 DIAGNOSIS — G20 Parkinson's disease: Secondary | ICD-10-CM | POA: Diagnosis not present

## 2016-09-29 DIAGNOSIS — G231 Progressive supranuclear ophthalmoplegia [Steele-Richardson-Olszewski]: Secondary | ICD-10-CM | POA: Diagnosis not present

## 2016-09-29 DIAGNOSIS — I10 Essential (primary) hypertension: Secondary | ICD-10-CM | POA: Diagnosis not present

## 2016-09-30 DIAGNOSIS — I10 Essential (primary) hypertension: Secondary | ICD-10-CM | POA: Diagnosis not present

## 2016-09-30 DIAGNOSIS — G231 Progressive supranuclear ophthalmoplegia [Steele-Richardson-Olszewski]: Secondary | ICD-10-CM | POA: Diagnosis not present

## 2016-09-30 DIAGNOSIS — G20 Parkinson's disease: Secondary | ICD-10-CM | POA: Diagnosis not present

## 2016-09-30 DIAGNOSIS — R1313 Dysphagia, pharyngeal phase: Secondary | ICD-10-CM | POA: Diagnosis not present

## 2016-09-30 DIAGNOSIS — F0391 Unspecified dementia with behavioral disturbance: Secondary | ICD-10-CM | POA: Diagnosis not present

## 2016-09-30 DIAGNOSIS — F329 Major depressive disorder, single episode, unspecified: Secondary | ICD-10-CM | POA: Diagnosis not present

## 2016-10-03 ENCOUNTER — Telehealth: Payer: Self-pay | Admitting: Neurology

## 2016-10-03 NOTE — Telephone Encounter (Signed)
Would like a call back regarding help to come out to the house. A nurse has been coming out and has stopped.  They would like to have an extension to keep having someone come out. Thanks

## 2016-10-05 ENCOUNTER — Other Ambulatory Visit: Payer: Self-pay | Admitting: Family Medicine

## 2016-10-05 NOTE — Telephone Encounter (Signed)
Spoke to daughter [name]. Advised sent message to [facility] coordinator asking for the agency to contact the family about extension of service details. Dghtr verbalized understanding.

## 2016-10-05 NOTE — Telephone Encounter (Signed)
Spoke to daughter [name]. Explained [facility] coordinator contacted Dr. [name] and I explaining their last visit was on 03/08 based off of insurance, and if needing additional nursing services family may want to consider private duty nursing. Daughter verbalized understanding.

## 2016-10-07 DIAGNOSIS — R531 Weakness: Secondary | ICD-10-CM | POA: Diagnosis not present

## 2016-10-12 ENCOUNTER — Other Ambulatory Visit: Payer: Self-pay | Admitting: Neurology

## 2016-10-12 NOTE — Progress Notes (Signed)
06-30-16 (EPIC)    EKG, CXR 06-15-16  (EPIC)   ECHO

## 2016-10-12 NOTE — Patient Instructions (Addendum)
Brian Montgomery  10/12/2016   Your procedure is scheduled on: 10-20-16  Report to [facility] Main  Entrance to [facility] to Short Stay on 3rd Floor at 1245 PM.  Call this number if you have problems the morning of surgery [phone]   Remember: ONLY 1 PERSON MAY GO WITH YOU TO SHORT STAY TO GET  READY MORNING OF [place].  The day before surgery, you may eat until Midnight. After Midnight, you can have a clear liquid diet until 0800. After 800 AM, nothing by mouth  CLEAR LIQUID DIET   Foods Allowed                                                                     Foods Excluded  Coffee and tea, regular and decaf                             liquids that you cannot  Plain Jell-O in any flavor                                             see through such as: Fruit ices (not with fruit pulp)                                     milk, soups, orange juice  Iced Popsicles                                    All solid food Carbonated beverages, regular and diet                                    Cranberry, grape and apple juices Sports drinks like Gatorade Lightly seasoned clear broth or consume(fat free) Sugar, honey syrup  Sample Menu Breakfast                                Lunch                                     Supper Cranberry juice                    Beef broth                            Chicken broth Jell-O                                     Grape juice  Apple juice Coffee or tea                        Jell-O                                      Popsicle                                                Coffee or tea                        Coffee or tea  _____________________________________________________________________      Take these medicines the morning of surgery with A SIP OF WATER: Carbidopa-Levodopa (Sinemet), Citalopram (Celexa), Simvastatin (Zocor)              You may not have any metal on your body including  hair pins and              piercings  Do not wear jewelry, make-up, lotions, powders or perfumes, deodorant              Men may shave face and neck.   Do not bring valuables to the hospital. [place].  Contacts, dentures or bridgework may not be worn into surgery.  Leave suitcase in the car. After surgery it may be brought to your room.     Patients discharged the day of surgery will not be allowed to drive home.  Name and phone number of your driver:  Special Instructions: N/A              Please read over the following fact sheets you were given: _____________________________________________________________________  [facility] - Preparing for Surgery Before surgery, you can play an important role.  Because skin is not sterile, your skin needs to be as free of germs as possible.  You can reduce the number of germs on your skin by washing with CHG (chlorahexidine gluconate) soap before surgery.  CHG is an antiseptic cleaner which kills germs and bonds with the skin to continue killing germs even after washing. Please DO NOT use if you have an allergy to CHG or antibacterial soaps.  If your skin becomes reddened/irritated stop using the CHG and inform your nurse when you arrive at Short Stay. Do not shave (including legs and underarms) for at least 48 hours prior to the first CHG shower.  You may shave your face/neck. Please follow these instructions carefully:  1.  Shower with CHG Soap the night before surgery and the  morning of Surgery.  2.  If you choose to wash your hair, wash your hair first as usual with your  normal  shampoo.  3.  After you shampoo, rinse your hair and body thoroughly to remove the  shampoo.                           4.  Use CHG as you would any other liquid soap.  You can apply chg directly  to the skin and wash  Gently with a scrungie or clean washcloth.  5.  Apply the CHG Soap to your body  ONLY FROM THE NECK DOWN.   Do not use on face/ open                           Wound or open sores. Avoid contact with eyes, ears mouth and genitals (private parts).                       Wash face,  Genitals (private parts) with your normal soap.             6.  Wash thoroughly, paying special attention to the area where your surgery  will be performed.  7.  Thoroughly rinse your body with warm water from the neck down.  8.  DO NOT shower/wash with your normal soap after using and rinsing off  the CHG Soap.                9.  Pat yourself dry with a clean towel.            10.  Wear clean pajamas.            11.  Place clean sheets on your bed the night of your first shower and do not  sleep with pets. Day of Surgery : Do not apply any lotions/deodorants the morning of surgery.  Please wear clean clothes to the hospital/surgery center.  FAILURE TO FOLLOW THESE INSTRUCTIONS MAY RESULT IN THE CANCELLATION OF YOUR SURGERY PATIENT SIGNATURE_________________________________  NURSE SIGNATURE__________________________________  ________________________________________________________________________

## 2016-10-14 ENCOUNTER — Encounter (HOSPITAL_COMMUNITY): Payer: Self-pay

## 2016-10-14 ENCOUNTER — Encounter (HOSPITAL_COMMUNITY)
Admission: RE | Admit: 2016-10-14 | Discharge: 2016-10-14 | Disposition: A | Discharge status: Home / Self Care | Payer: Medicare Other | Source: Ambulatory Visit | Attending: Urology | Admitting: Urology

## 2016-10-14 DIAGNOSIS — Z01818 Encounter for other preprocedural examination: Secondary | ICD-10-CM | POA: Insufficient documentation

## 2016-10-14 DIAGNOSIS — C679 Malignant neoplasm of bladder, unspecified: Secondary | ICD-10-CM | POA: Insufficient documentation

## 2016-10-14 LAB — CBC
HEMATOCRIT: 33.2 % — AB (ref 39.0–52.0)
HEMOGLOBIN: 10.9 g/dL — AB (ref 13.0–17.0)
MCH: 29.9 pg (ref 26.0–34.0)
MCHC: 32.8 g/dL (ref 30.0–36.0)
MCV: 91 fL (ref 78.0–100.0)
Platelets: 352 10*3/uL (ref 150–400)
RBC: 3.65 MIL/uL — ABNORMAL LOW (ref 4.22–5.81)
RDW: 14.2 % (ref 11.5–15.5)
WBC: 6.9 10*3/uL (ref 4.0–10.5)

## 2016-10-14 LAB — BASIC METABOLIC PANEL
Anion gap: 8 (ref 5–15)
BUN: 34 mg/dL — ABNORMAL HIGH (ref 6–20)
CALCIUM: 9.4 mg/dL (ref 8.9–10.3)
CO2: 28 mmol/L (ref 22–32)
Chloride: 107 mmol/L (ref 101–111)
Creatinine, Ser: 1.53 mg/dL — ABNORMAL HIGH (ref 0.61–1.24)
GFR calc Af Amer: 48 mL/min — ABNORMAL LOW (ref >60)
GFR calc non Af Amer: 42 mL/min — ABNORMAL LOW (ref >60)
GLUCOSE: 116 mg/dL — AB (ref 65–99)
POTASSIUM: 3.8 mmol/L (ref 3.5–5.1)
Sodium: 143 mmol/L (ref 135–145)

## 2016-10-14 NOTE — Progress Notes (Signed)
10-14-16 CBC and BMP labs routed to Dr. Alinda Money for review.

## 2016-10-19 NOTE — H&P (Signed)
CC/HPI: Muscle invasive bladder cancer and prostate cancer   Brian Montgomery returns today after completing Tri-modality therapy for muscle invasive bladder cancer. He was unable to tolerate a full course of chemotherapy. However, he was able to complete his radiation therapy. He did have some difficulty toward the end of therapy and became quite weak with increased difficulty with mobility. Unfortunately, he did break his hip approximately 10-12 weeks ago. He has been healing from this and appears to be doing relatively well. He denies any recent hematuria. He does have some urinary frequency although his wife denies significant incontinence. He follows up today for surveillance cystoscopy to reassess his response to therapy.     ALLERGIES: No Allergies    MEDICATIONS: Levofloxacin 500 mg tablet 1 tablet po daily Begin day prior to biopsy  Carbidopa-Levodopa 25 mg-100 mg tablet  Donepezil Hcl 5 mg tablet Oral  Hydrocodone-Acetaminophen 5 mg-325 mg tablet  Lisinopril-Hydrochlorothiazide 10 mg-12.5 mg tablet  Prochlorperazine Maleate 10 mg tablet  Simvastatin 20 mg tablet Oral     GU PSH: Catheterize For Residual - 01/18/2016 Cysto Dilate Stricture (M or F) - 01/19/2016 Cystoscopy - 01/19/2016 Cystoscopy Irrigate Clot - 01/21/2016 Cystoscopy TURBT <2 cm - 03/22/2016 Cystoscopy TURBT >5 cm - 01/21/2016 Initial Male VB Sounds - 01/19/2016 Locm 300-399Mg /Ml Iodine,1Ml - 01/29/2016 PLACE RT DEVICE/MARKER, PROS - 04/13/2016 Prostate Needle Biopsy - 03/22/2016      PSH Notes: Back Surgery   NON-GU PSH: Hip Arthroscopy/surgery, Left    GU PMH: Prostate Cancer - 04/06/2016 Gross hematuria - 01/18/2016, Gross hematuria, - 12/09/2015 Nodular prostate w/o LUTS, Prostate nodule - 12/09/2015 Urinary Tract Inf, Unspec site, Urinary tract infection - 12/09/2015 Bladder Cancer, overlapping sites      PMH Notes:   1) Prostate cancer: He presented with a prostate nodule and a PSA of 9.3. He underwent a  TRUS biopsy demonstrating 5 out of 12 biopsy cores positive for malignancy. He was already proceeding with radiation for treatment of his bladder cancer and therefore elected to simultaneously treat his prostate cancer with EBRT as well.   Sep 2017: 12 core biopsy - 5/12 cores positive for Gleason 3+3=6 adenocarcinoma  Oct-Nov 2017: 71 Gy radiation with 45 Gy to pelvic lymph nodes (for bladder cancer)   2) Bladder cancer: He developed gross hematuria in May 2017. This was initially thought to be related to infection but persisted and ultimately he required catheterization. He underwent cystoscopy in the OR in July 2017 due to an inability to well visualize the bladder. He was ultimately found to have a large 6 cm bladder tumor and underwent TURBT. He was noted to have muscle invasive bladder cancer. He declined radical cystectomy and elected to proceed with radiation/chemotherapy trimodality therapy.   Jul 2017: Cysto (no definite tumors seen), large formed clot in bladder  Jul 2017: TURBT - High grade muscle invasive urothelial carcinoma  Sep 2017: TUR of residual tumor prior to radiation (residual low grade, Ta disease)  Oct-Nov 2017: 20 Gy to bladder, 45 Gy to pelvic lymph nodes (carboplatin chemotherapy)     NON-GU PMH: Personal history of other diseases of the circulatory system, History of hypertension - 12/09/2015 Personal history of other diseases of the musculoskeletal system and connective tissue, History of arthritis - 12/09/2015 Personal history of other diseases of the nervous system and sense organs, History of polyneuropathy - 12/09/2015 Personal history of other endocrine, nutritional and metabolic disease, History of hyperlipidemia - 12/09/2015    FAMILY HISTORY:  Death of parent - Runs In Family malignant neoplasm of kidney - Runs In Family   SOCIAL HISTORY: Marital Status: Married     Notes: Married, Number of children, Retired, Former smoker, Alcohol use   REVIEW OF SYSTEMS:     GU Review Male:   Patient denies frequent urination, hard to postpone urination, burning/ pain with urination, get up at night to urinate, leakage of urine, stream starts and stops, trouble starting your streams, and have to strain to urinate .  Gastrointestinal (Lower):   Patient denies diarrhea and constipation.  Gastrointestinal (Upper):   Patient denies nausea and vomiting.  Constitutional:   Patient denies fever, night sweats, weight loss, and fatigue.  Skin:   Patient denies skin rash/ lesion and itching.  Eyes:   Patient denies blurred vision and double vision.  Ears/ Nose/ Throat:   Patient denies sore throat and sinus problems.  Hematologic/Lymphatic:   Patient denies swollen glands and easy bruising.  Cardiovascular:   Patient denies leg swelling and chest pains.  Respiratory:   Patient reports cough. Patient denies shortness of breath.  Endocrine:   Patient denies excessive thirst.  Musculoskeletal:   Patient denies back pain and joint pain.  Neurological:   Patient denies headaches and dizziness.  Psychologic:   Patient reports depression and anxiety.    VITAL SIGNS:     Weight 170 lb / 77.11 kg  Height 65 in / 165.1 cm   BMI 28.3 kg/m   GU PHYSICAL EXAMINATION:    Urethral Meatus: Normal size. No lesion, no wart, no polyp, no balanitis, no discharge. Normal location.    MULTI-SYSTEM PHYSICAL EXAMINATION:    Constitutional: Well-nourished. No physical deformities. Normally developed. Good grooming.       ASSESSMENT:      ICD-10 Details  1 GU:   Bladder Cancer, overlapping sites - C67.8   2   Prostate Cancer - C61    PLAN:      1. Muscle invasive bladder cancer: He has completed chemotherapy and radiation. Cystoscopy has revealed a concerning area toward the left posterior bladder which may simply represent inflammatory change related to radiation therapy although could also represent recurrent malignancy. I discussed this finding with the patient and his wife. I  have recommended that he proceed with cystoscopy under anesthesia which transurethral resection/biopsy of this area.           APPENDED NOTES:  Cytology was negative.

## 2016-10-20 ENCOUNTER — Ambulatory Visit (HOSPITAL_COMMUNITY): Payer: Medicare Other | Admitting: Certified Registered Nurse Anesthetist

## 2016-10-20 ENCOUNTER — Ambulatory Visit (HOSPITAL_COMMUNITY): Payer: Medicare Other

## 2016-10-20 ENCOUNTER — Encounter (HOSPITAL_COMMUNITY): Payer: Self-pay | Admitting: *Deleted

## 2016-10-20 ENCOUNTER — Observation Stay (HOSPITAL_COMMUNITY)
Admission: RE | Admit: 2016-10-20 | Discharge: 2016-10-21 | Disposition: A | Discharge status: Home / Self Care | Payer: Medicare Other | Source: Ambulatory Visit | Attending: Urology | Admitting: Urology

## 2016-10-20 ENCOUNTER — Encounter (HOSPITAL_COMMUNITY)
Admission: RE | Disposition: A | Discharge status: Home / Self Care | Payer: Self-pay | Source: Ambulatory Visit | Attending: Urology

## 2016-10-20 DIAGNOSIS — Z9221 Personal history of antineoplastic chemotherapy: Secondary | ICD-10-CM | POA: Insufficient documentation

## 2016-10-20 DIAGNOSIS — Z79899 Other long term (current) drug therapy: Secondary | ICD-10-CM | POA: Diagnosis not present

## 2016-10-20 DIAGNOSIS — E785 Hyperlipidemia, unspecified: Secondary | ICD-10-CM | POA: Diagnosis not present

## 2016-10-20 DIAGNOSIS — Z08 Encounter for follow-up examination after completed treatment for malignant neoplasm: Principal | ICD-10-CM | POA: Insufficient documentation

## 2016-10-20 DIAGNOSIS — Z923 Personal history of irradiation: Secondary | ICD-10-CM | POA: Insufficient documentation

## 2016-10-20 DIAGNOSIS — C678 Malignant neoplasm of overlapping sites of bladder: Secondary | ICD-10-CM | POA: Diagnosis not present

## 2016-10-20 DIAGNOSIS — Z8551 Personal history of malignant neoplasm of bladder: Secondary | ICD-10-CM | POA: Diagnosis not present

## 2016-10-20 DIAGNOSIS — Z8546 Personal history of malignant neoplasm of prostate: Secondary | ICD-10-CM | POA: Diagnosis not present

## 2016-10-20 DIAGNOSIS — C679 Malignant neoplasm of bladder, unspecified: Secondary | ICD-10-CM | POA: Diagnosis not present

## 2016-10-20 DIAGNOSIS — Z79891 Long term (current) use of opiate analgesic: Secondary | ICD-10-CM | POA: Diagnosis not present

## 2016-10-20 DIAGNOSIS — Z87891 Personal history of nicotine dependence: Secondary | ICD-10-CM | POA: Insufficient documentation

## 2016-10-20 DIAGNOSIS — N3289 Other specified disorders of bladder: Secondary | ICD-10-CM | POA: Diagnosis not present

## 2016-10-20 DIAGNOSIS — I10 Essential (primary) hypertension: Secondary | ICD-10-CM | POA: Diagnosis not present

## 2016-10-20 DIAGNOSIS — N3090 Cystitis, unspecified without hematuria: Secondary | ICD-10-CM | POA: Insufficient documentation

## 2016-10-20 DIAGNOSIS — Z841 Family history of disorders of kidney and ureter: Secondary | ICD-10-CM | POA: Diagnosis not present

## 2016-10-20 DIAGNOSIS — N183 Chronic kidney disease, stage 3 (moderate): Secondary | ICD-10-CM | POA: Diagnosis not present

## 2016-10-20 DIAGNOSIS — C61 Malignant neoplasm of prostate: Secondary | ICD-10-CM | POA: Diagnosis not present

## 2016-10-20 DIAGNOSIS — R609 Edema, unspecified: Secondary | ICD-10-CM | POA: Insufficient documentation

## 2016-10-20 DIAGNOSIS — I129 Hypertensive chronic kidney disease with stage 1 through stage 4 chronic kidney disease, or unspecified chronic kidney disease: Secondary | ICD-10-CM | POA: Diagnosis not present

## 2016-10-20 HISTORY — PX: CYSTOSCOPY W/ RETROGRADES: SHX1426

## 2016-10-20 HISTORY — PX: TRANSURETHRAL RESECTION OF BLADDER TUMOR: SHX2575

## 2016-10-20 SURGERY — TURBT (TRANSURETHRAL RESECTION OF BLADDER TUMOR)
Anesthesia: General

## 2016-10-20 MED ORDER — STERILE WATER FOR IRRIGATION IR SOLN
Status: DC | PRN
  Administered 2016-10-20: 3000 mL

## 2016-10-20 MED ORDER — ROCURONIUM BROMIDE 10 MG/ML (PF) SYRINGE
PREFILLED_SYRINGE | INTRAVENOUS | Status: DC | PRN
  Administered 2016-10-20: 30 mg via INTRAVENOUS

## 2016-10-20 MED ORDER — PHENAZOPYRIDINE HCL 100 MG PO TABS
100.0000 mg | ORAL_TABLET | Freq: Once | ORAL | Status: AC | PRN
  Administered 2016-10-20: 100 mg via ORAL
  Filled 2016-10-20: qty 1

## 2016-10-20 MED ORDER — HYDROCHLOROTHIAZIDE 12.5 MG PO CAPS
12.5000 mg | ORAL_CAPSULE | Freq: Every day | ORAL | Status: DC
  Administered 2016-10-21: 12.5 mg via ORAL
  Filled 2016-10-20: qty 1

## 2016-10-20 MED ORDER — PROMETHAZINE HCL 25 MG/ML IJ SOLN: 6.2500 mg | INTRAMUSCULAR | Status: DC | PRN

## 2016-10-20 MED ORDER — ACETAMINOPHEN 500 MG PO TABS
1000.0000 mg | ORAL_TABLET | Freq: Four times a day (QID) | ORAL | Status: DC
  Administered 2016-10-21 (×2): 1000 mg via ORAL
  Filled 2016-10-20 (×2): qty 2

## 2016-10-20 MED ORDER — ONDANSETRON HCL 4 MG/2ML IJ SOLN: 4.0000 mg | INTRAMUSCULAR | Status: DC | PRN

## 2016-10-20 MED ORDER — FENTANYL CITRATE (PF) 250 MCG/5ML IJ SOLN
INTRAMUSCULAR | Status: AC
  Filled 2016-10-20: qty 5

## 2016-10-20 MED ORDER — DONEPEZIL HCL 5 MG PO TABS
10.0000 mg | ORAL_TABLET | Freq: Every day | ORAL | Status: DC
  Administered 2016-10-20: 10 mg via ORAL
  Filled 2016-10-20: qty 2

## 2016-10-20 MED ORDER — SUGAMMADEX SODIUM 200 MG/2ML IV SOLN
INTRAVENOUS | Status: AC
  Filled 2016-10-20: qty 2

## 2016-10-20 MED ORDER — SIMVASTATIN 20 MG PO TABS
20.0000 mg | ORAL_TABLET | Freq: Every day | ORAL | Status: DC
  Administered 2016-10-21: 20 mg via ORAL
  Filled 2016-10-20: qty 1

## 2016-10-20 MED ORDER — CEFAZOLIN SODIUM-DEXTROSE 2-4 GM/100ML-% IV SOLN
2.0000 g | INTRAVENOUS | Status: AC
  Administered 2016-10-20: 2 g via INTRAVENOUS
  Filled 2016-10-20: qty 100

## 2016-10-20 MED ORDER — FENTANYL CITRATE (PF) 100 MCG/2ML IJ SOLN
25.0000 ug | INTRAMUSCULAR | Status: DC | PRN
  Administered 2016-10-20: 25 ug via INTRAVENOUS

## 2016-10-20 MED ORDER — LACTATED RINGERS IV SOLN
INTRAVENOUS | Status: DC
  Administered 2016-10-20 – 2016-10-21 (×2): via INTRAVENOUS

## 2016-10-20 MED ORDER — LIDOCAINE 2% (20 MG/ML) 5 ML SYRINGE: INTRAMUSCULAR | Status: DC | PRN

## 2016-10-20 MED ORDER — DIPHENHYDRAMINE HCL 12.5 MG/5ML PO ELIX: 12.5000 mg | ORAL_SOLUTION | Freq: Four times a day (QID) | ORAL | Status: DC | PRN

## 2016-10-20 MED ORDER — MIDAZOLAM HCL 2 MG/2ML IJ SOLN
INTRAMUSCULAR | Status: AC
  Filled 2016-10-20: qty 2

## 2016-10-20 MED ORDER — CARBIDOPA-LEVODOPA 25-100 MG PO TABS
1.0000 | ORAL_TABLET | Freq: Three times a day (TID) | ORAL | Status: DC
  Administered 2016-10-20 – 2016-10-21 (×2): 1 via ORAL
  Filled 2016-10-20 (×2): qty 1

## 2016-10-20 MED ORDER — HYDROMORPHONE HCL 2 MG/ML IJ SOLN: 0.5000 mg | INTRAMUSCULAR | Status: DC | PRN

## 2016-10-20 MED ORDER — LIDOCAINE 2% (20 MG/ML) 5 ML SYRINGE
INTRAMUSCULAR | Status: AC
  Filled 2016-10-20: qty 5

## 2016-10-20 MED ORDER — PHENYLEPHRINE 40 MCG/ML (10ML) SYRINGE FOR IV PUSH (FOR BLOOD PRESSURE SUPPORT)
PREFILLED_SYRINGE | INTRAVENOUS | Status: DC | PRN
  Administered 2016-10-20: 120 ug via INTRAVENOUS
  Administered 2016-10-20: 80 ug via INTRAVENOUS
  Administered 2016-10-20: 120 ug via INTRAVENOUS

## 2016-10-20 MED ORDER — CITALOPRAM HYDROBROMIDE 20 MG PO TABS
10.0000 mg | ORAL_TABLET | Freq: Every day | ORAL | Status: DC
  Administered 2016-10-20 – 2016-10-21 (×2): 10 mg via ORAL
  Filled 2016-10-20 (×2): qty 1

## 2016-10-20 MED ORDER — PROPOFOL 10 MG/ML IV BOLUS
INTRAVENOUS | Status: AC
  Filled 2016-10-20: qty 20

## 2016-10-20 MED ORDER — ROCURONIUM BROMIDE 50 MG/5ML IV SOSY
PREFILLED_SYRINGE | INTRAVENOUS | Status: AC
  Filled 2016-10-20: qty 5

## 2016-10-20 MED ORDER — LISINOPRIL 10 MG PO TABS
10.0000 mg | ORAL_TABLET | Freq: Every day | ORAL | Status: DC
  Administered 2016-10-21: 10 mg via ORAL
  Filled 2016-10-20: qty 1

## 2016-10-20 MED ORDER — LIDOCAINE 2% (20 MG/ML) 5 ML SYRINGE
INTRAMUSCULAR | Status: DC | PRN
  Administered 2016-10-20: 80 mg via INTRAVENOUS

## 2016-10-20 MED ORDER — PROPOFOL 10 MG/ML IV BOLUS
INTRAVENOUS | Status: DC | PRN
  Administered 2016-10-20: 150 mg via INTRAVENOUS

## 2016-10-20 MED ORDER — BENZONATATE 100 MG PO CAPS: 100.0000 mg | ORAL_CAPSULE | Freq: Every day | ORAL | Status: DC | PRN

## 2016-10-20 MED ORDER — FENTANYL CITRATE (PF) 100 MCG/2ML IJ SOLN
INTRAMUSCULAR | Status: AC
  Filled 2016-10-20: qty 2

## 2016-10-20 MED ORDER — ONDANSETRON HCL 4 MG/2ML IJ SOLN
INTRAMUSCULAR | Status: AC
  Filled 2016-10-20: qty 2

## 2016-10-20 MED ORDER — PHENYLEPHRINE 40 MCG/ML (10ML) SYRINGE FOR IV PUSH (FOR BLOOD PRESSURE SUPPORT)
PREFILLED_SYRINGE | INTRAVENOUS | Status: AC
  Filled 2016-10-20: qty 10

## 2016-10-20 MED ORDER — PHENAZOPYRIDINE HCL 100 MG PO TABS: 100.0000 mg | ORAL_TABLET | Freq: Three times a day (TID) | ORAL | 0 refills | Status: DC | PRN

## 2016-10-20 MED ORDER — SUGAMMADEX SODIUM 200 MG/2ML IV SOLN
INTRAVENOUS | Status: DC | PRN
  Administered 2016-10-20: 150 mg via INTRAVENOUS

## 2016-10-20 MED ORDER — LOPERAMIDE HCL 2 MG PO CAPS: 2.0000 mg | ORAL_CAPSULE | Freq: Three times a day (TID) | ORAL | Status: DC | PRN

## 2016-10-20 MED ORDER — ONDANSETRON HCL 4 MG/2ML IJ SOLN
INTRAMUSCULAR | Status: DC | PRN
  Administered 2016-10-20: 4 mg via INTRAVENOUS

## 2016-10-20 MED ORDER — LISINOPRIL-HYDROCHLOROTHIAZIDE 10-12.5 MG PO TABS: 1.0000 | ORAL_TABLET | Freq: Every day | ORAL | Status: DC

## 2016-10-20 MED ORDER — OXYCODONE HCL 5 MG PO TABS: 5.0000 mg | ORAL_TABLET | ORAL | Status: DC | PRN

## 2016-10-20 MED ORDER — FENTANYL CITRATE (PF) 250 MCG/5ML IJ SOLN
INTRAMUSCULAR | Status: DC | PRN
  Administered 2016-10-20: 50 ug via INTRAVENOUS
  Administered 2016-10-20: 75 ug via INTRAVENOUS
  Administered 2016-10-20: 25 ug via INTRAVENOUS

## 2016-10-20 MED ORDER — DIPHENHYDRAMINE HCL 50 MG/ML IJ SOLN: 12.5000 mg | Freq: Four times a day (QID) | INTRAMUSCULAR | Status: DC | PRN

## 2016-10-20 MED ORDER — IOHEXOL 300 MG/ML  SOLN
INTRAMUSCULAR | Status: DC | PRN
  Administered 2016-10-20: 45 mL via URETHRAL

## 2016-10-20 MED ORDER — MEPERIDINE HCL 50 MG/ML IJ SOLN: 6.2500 mg | INTRAMUSCULAR | Status: DC | PRN

## 2016-10-20 MED ORDER — LACTATED RINGERS IV SOLN
INTRAVENOUS | Status: DC
  Administered 2016-10-20 (×2): via INTRAVENOUS

## 2016-10-20 MED ORDER — EPHEDRINE 5 MG/ML INJ
INTRAVENOUS | Status: AC
  Filled 2016-10-20: qty 10

## 2016-10-20 MED ORDER — EPHEDRINE SULFATE-NACL 50-0.9 MG/10ML-% IV SOSY
PREFILLED_SYRINGE | INTRAVENOUS | Status: DC | PRN
  Administered 2016-10-20: 15 mg via INTRAVENOUS

## 2016-10-20 SURGICAL SUPPLY — 18 items
BAG URINE DRAINAGE (UROLOGICAL SUPPLIES) IMPLANT
BAG URO CATCHER STRL LF (MISCELLANEOUS) ×3 IMPLANT
CATH INTERMIT  6FR 70CM (CATHETERS) ×3 IMPLANT
CLOTH BEACON ORANGE TIMEOUT ST (SAFETY) ×3 IMPLANT
COVER SURGICAL LIGHT HANDLE (MISCELLANEOUS) ×3 IMPLANT
ELECT REM PT RETURN 15FT ADLT (MISCELLANEOUS) ×3 IMPLANT
EVACUATOR MICROVAS BLADDER (UROLOGICAL SUPPLIES) IMPLANT
GLOVE BIOGEL M STRL SZ7.5 (GLOVE) ×3 IMPLANT
GOWN STRL REUS W/TWL LRG LVL3 (GOWN DISPOSABLE) ×6 IMPLANT
GUIDEWIRE STR DUAL SENSOR (WIRE) ×3 IMPLANT
LOOP CUT BIPOLAR 24F LRG (ELECTROSURGICAL) ×2 IMPLANT
MANIFOLD NEPTUNE II (INSTRUMENTS) ×3 IMPLANT
NS IRRIG 1000ML POUR BTL (IV SOLUTION) ×2 IMPLANT
PACK CYSTO (CUSTOM PROCEDURE TRAY) ×3 IMPLANT
SET ASPIRATION TUBING (TUBING) IMPLANT
SYRINGE IRR TOOMEY STRL 70CC (SYRINGE) IMPLANT
TUBING CONNECTING 10 (TUBING) ×2 IMPLANT
TUBING CONNECTING 10' (TUBING) ×1

## 2016-10-20 NOTE — Progress Notes (Addendum)
Family expresses concern about patient being discharged as he has recently broken his hip and has not been able to ambulate since that time. He has rolled out of bed and fallen twice this week. They request RN to call surgeon to see if patient can spend the night.Call placed to [facility].   [id number]   Received phone call from MD that patient is to be admitted. Called bed placement and released order to admit patient.

## 2016-10-20 NOTE — Discharge Instructions (Addendum)
General Anesthesia, Adult, Care After These instructions provide you with information about caring for yourself after your procedure. Your health care provider may also give you more specific instructions. Your treatment has been planned according to current medical practices, but problems sometimes occur. Call your health care provider if you have any problems or questions after your procedure. What can I expect after the procedure? After the procedure, it is common to have: Vomiting. A sore throat. Mental slowness. It is common to feel: Nauseous. Cold or shivery. Sleepy. Tired. Sore or achy, even in parts of your body where you did not have surgery. Follow these instructions at home: For at least 24 hours after the procedure:  Do not: Participate in activities where you could fall or become injured. Drive. Use heavy machinery. Drink alcohol. Take sleeping pills or medicines that cause drowsiness. Make important decisions or sign legal documents. Take care of children on your own. Rest. Eating and drinking  If you vomit, drink water, juice, or soup when you can drink without vomiting. Drink enough fluid to keep your urine clear or pale yellow. Make sure you have little or no nausea before eating solid foods. Follow the diet recommended by your health care provider. General instructions  Have a responsible adult stay with you until you are awake and alert. Return to your normal activities as told by your health care provider. Ask your health care provider what activities are safe for you. Take over-the-counter and prescription medicines only as told by your health care provider. If you smoke, do not smoke without supervision. Keep all follow-up visits as told by your health care provider. This is important. Contact a health care provider if: You continue to have nausea or vomiting at home, and medicines are not helpful. You cannot drink fluids or start eating again. You cannot  urinate after 8-12 hours. You develop a skin rash. You have fever. You have increasing redness at the site of your procedure. Get help right away if: You have difficulty breathing. You have chest pain. You have unexpected bleeding. You feel that you are having a life-threatening or urgent problem. This information is not intended to replace advice given to you by your health care provider. Make sure you discuss any questions you have with your health care provider. Document Released: 10/03/2000 Document Revised: 11/30/2015 Document Reviewed: 06/11/2015 Elsevier Interactive Patient Education  2017 [place] may see some blood in the urine and may have some burning with urination for 48-72 hours. You also may notice that you have to urinate more frequently or urgently after your procedure which is normal.   2. You should call should you develop an inability urinate, fever > 101, persistent nausea and vomiting that prevents you from eating or drinking to stay hydrated.

## 2016-10-20 NOTE — Anesthesia Procedure Notes (Signed)
Procedure Name: Intubation Date/Time: 10/20/2016 3:50 PM Performed by: Cynda Familia Pre-anesthesia Checklist: Patient identified, Emergency Drugs available, Suction available and Patient being monitored Patient Re-evaluated:Patient Re-evaluated prior to inductionOxygen Delivery Method: Circle System Utilized Preoxygenation: Pre-oxygenation with 100% oxygen Intubation Type: IV induction Ventilation: Mask ventilation without difficulty Laryngoscope Size: Miller and 2 Grade View: Grade I Tube type: Oral Number of attempts: 1 Airway Equipment and Method: Stylet Placement Confirmation: ETT inserted through vocal cords under direct vision,  positive ETCO2 and breath sounds checked- equal and bilateral Secured at: 23 cm Tube secured with: Tape Dental Injury: Teeth and Oropharynx as per pre-operative assessment  Comments: Smooth IV induction Hatchette--- intubation AM CRNA--- atraumatic-- teeth and mouth as preop--- no teeth uppper and few teeth bottom--- very poor dentition--- bilat BS Hatchette

## 2016-10-20 NOTE — Anesthesia Preprocedure Evaluation (Addendum)
Anesthesia Evaluation  Patient identified by MRN, date of birth, ID band Patient awake    Reviewed: Allergy & Precautions, NPO status , Patient's Chart, lab work & pertinent test results  Airway Mallampati: II  TM Distance: >3 FB Neck ROM: Full    Dental  (+) Edentulous Upper, Missing, Dental Advisory Given   Pulmonary former smoker,    Pulmonary exam normal breath sounds clear to auscultation       Cardiovascular hypertension, Pt. on medications Normal cardiovascular exam Rhythm:Regular Rate:Normal     Neuro/Psych PSYCHIATRIC DISORDERS Supranuclear palsy Ataxia Hereditary and idiopathic peripheral neuropathy  Neuromuscular disease    GI/Hepatic negative GI ROS, Neg liver ROS,   Endo/Other  Hyperlipidemia  Renal/GU Renal InsufficiencyRenal disease   Bladder Ca Prostate Ca ED    Musculoskeletal  (+) Arthritis ,   Abdominal (+) - obese,   Peds  Hematology  (+) anemia ,   Anesthesia Other Findings   Reproductive/Obstetrics                           Anesthesia Physical Anesthesia Plan  ASA: III  Anesthesia Plan: General   Post-op Pain Management:    Induction: Intravenous  Airway Management Planned: Oral ETT  Additional Equipment:   Intra-op Plan:   Post-operative Plan: Extubation in OR  Informed Consent: I have reviewed the patients History and Physical, chart, labs and discussed the procedure including the risks, benefits and alternatives for the proposed anesthesia with the patient or authorized representative who has indicated his/her understanding and acceptance.   Dental advisory given  Plan Discussed with: Anesthesiologist, CRNA and Surgeon  Anesthesia Plan Comments:        Anesthesia Quick Evaluation

## 2016-10-20 NOTE — Anesthesia Postprocedure Evaluation (Addendum)
Anesthesia Post Note  Patient: [name]  Procedure(s) Performed: Procedure(s) (LRB): TRANSURETHRAL RESECTION OF BLADDER TUMOR (TURBT) (N/A) CYSTOSCOPY WITH BILATERAL RETROGRADE PYELOGRAM (N/A)  Patient location during evaluation: PACU Anesthesia Type: General Level of consciousness: awake and sedated Pain management: pain level controlled Vital Signs Assessment: post-procedure vital signs reviewed and stable Respiratory status: spontaneous breathing Cardiovascular status: stable Postop Assessment: no signs of nausea or vomiting Anesthetic complications: no        Last Vitals:  Vitals:   10/20/16 1715 10/20/16 1730  BP: (!) 143/88 133/87  Pulse: 92 89  Resp: (!) 21 18  Temp:  36.6 C    Last Pain:  Vitals:   10/20/16 1730  TempSrc:   PainSc: 0-No pain   Pain Goal: Patients Stated Pain Goal: 3 (10/20/16 1427)               Dylann Layne JR,JOHN Mateo Flow

## 2016-10-20 NOTE — Op Note (Signed)
Preoperative diagnosis: Muscle invasive urothelial carcinoma of the bladder  Postoperative diagnosis: Same as above  Procedures: 1.  Cystoscopy 2.  Pelvic exam under anesthesia 3.  Bilateral retrograde pyelography with interpretation 4.  Transurethral resection of bladder mass  Surgeon: Pryor Curia. M.D.  Anesthesia: General  Complications: None  Specimens: Bladder tumor  Disposition of specimens: To pathology  Indication: Brian Montgomery is a 81 year old gentleman with a history of muscle invasive urothelial carcinoma of the bladder.  He was treated with tri-modality therapy including transurethral resection, radiation therapy, and chemotherapy.  He recently presented for follow-up surveillance cystoscopy and was noted to have a large area of erythematous and edematous tissue within the bladder possibly consistent with radiation change versus locally recurrent disease.  After discussing these findings, it was recommended that he proceed with the above procedures.  The potential risks, complications, and expected recovery process was discussed.  Informed consent was obtained.  Description of procedure:  The patient was taken to the operating room and a general anesthetic was administered.  He was given preoperative antibiotics, placed in the dorsal lithotomy position, and prepped and draped in the usual sterile fashion.  Next, a preoperative timeout was performed.  Cystourethroscopy was performed with a 30 and 70 lens.  This revealed a normal posterior and anterior urethra without significant abnormalities.  Inspection of the bladder revealed a diffuse area of edematous and raised mucosal tissue extending from the left lateral bladder wall across the trigone toward the right hemitrigone.  This area encompassed approximately 5-6 cm of the bladder.  No other obvious bladder tumors or other mucosal pathology was identified.  Due to the edema, the ureteral orifices were not readily  apparent.  After some searching, I was able to identify and intubate the left ureteral orifice with a 6 French ureteral catheter.  Omnipaque contrast was injected and revealed a mildly dilated left ureter without evidence of filling defects and a renal collecting system without filling defects or significant dilation.  Upon removing the ureteral catheter, the left ureter appeared to drain quite readily.  The right ureteral orifice was somewhat more difficult to identify but was able to eventually be identified.  It was also cannulated with a 6 French ureteral catheter and Omnipaque contrast was injected.  This did demonstrate some mild dilation of the right renal collecting system and mild to moderate dilation of the right ureter.  No definite filling defects were appreciated.  There was also noted to be clearance from the right ureter of contrast.  On further evaluation during the procedure, obvious reflux was noted from each ureteral orifice indicating that he was not obstructed.  It was felt that placing ureteral stents would not be necessary at this time.  Furthermore, his serum creatinine was 1.5 preoperatively which was consistent with his baseline, with no indication of global renal dysfunction worsening.  Attention then turned to the bladder.  The previously mentioned edematous and raised tissue was transurethrally resected after a resectoscope was placed in bipolar loop was inserted.  Bipolar resection then commenced.  Considering the proximity of this tissue to the ureteral orifices, I did not make an attempt to remove all of this tissue considering the very large surface area of the bladder that it encompassed particularly toward the trigone and around the ureteral orifices.  However, I did resect approximately 3 cm of this tissue which was sent for permanent pathologic analysis.  Hemostasis was achieved using the bipolar loop cautery.  All bladder specimens were  removed from the bladder.   Reinspection of the bladder revealed excellent hemostasis.  The patient's bladder was then emptied.  The pelvic exam under anesthesia was noted without any 3-dimensional masses.  The prostate was noted to be flat and smooth consistent with his history of radiation therapy.  The patient tolerated the procedure well without complications.  He was able to be awakened and transferred to the recovery unit in satisfactory condition.

## 2016-10-20 NOTE — Progress Notes (Signed)
Report called to Janell Quiet RN and patient transferred to 9.

## 2016-10-20 NOTE — Transfer of Care (Signed)
Immediate Anesthesia Transfer of Care Note  Patient: Brian Montgomery  Procedure(s) Performed: Procedure(s): TRANSURETHRAL RESECTION OF BLADDER TUMOR (TURBT) (N/A) CYSTOSCOPY WITH BILATERAL RETROGRADE PYELOGRAM (N/A)  Patient Location: PACU  Anesthesia Type:General  Level of Consciousness: sedated  Airway & Oxygen Therapy: Patient Spontanous Breathing and Patient connected to face mask oxygen  Post-op Assessment: Report given to RN and Post -op Vital signs reviewed and stable  Post vital signs: Reviewed and stable  Last Vitals:  Vitals:   10/20/16 1314  BP: 113/79  Pulse: 77  Resp: 16  Temp: 36.4 C    Last Pain:  Vitals:   10/20/16 1427  TempSrc:   PainSc: 3       Patients Stated Pain Goal: 3 (09/81/19 1478)  Complications: No apparent anesthesia complications

## 2016-10-21 ENCOUNTER — Encounter (HOSPITAL_COMMUNITY): Payer: Self-pay | Admitting: Urology

## 2016-10-21 DIAGNOSIS — Z08 Encounter for follow-up examination after completed treatment for malignant neoplasm: Secondary | ICD-10-CM | POA: Diagnosis not present

## 2016-10-21 DIAGNOSIS — Z79891 Long term (current) use of opiate analgesic: Secondary | ICD-10-CM | POA: Diagnosis not present

## 2016-10-21 DIAGNOSIS — Z923 Personal history of irradiation: Secondary | ICD-10-CM | POA: Diagnosis not present

## 2016-10-21 DIAGNOSIS — N3090 Cystitis, unspecified without hematuria: Secondary | ICD-10-CM | POA: Diagnosis not present

## 2016-10-21 DIAGNOSIS — Z9221 Personal history of antineoplastic chemotherapy: Secondary | ICD-10-CM | POA: Diagnosis not present

## 2016-10-21 DIAGNOSIS — N3289 Other specified disorders of bladder: Secondary | ICD-10-CM | POA: Diagnosis not present

## 2016-10-21 NOTE — Evaluation (Signed)
Physical Therapy Evaluation Patient Details Name: Brian Montgomery MRN: [id number] DOB: February 28, 1936 Today's Date: 10/21/2016   History of Present Illness  TRANSURETHRAL RESECTION OF BLADDER TUMOR (TURBT) (N/A) on 10/20/16, history of demntia, fractured Hip, frequent falls from the bed, non ambulatory since fracture.  Clinical Impression  The family is present and reports that patient requires assistance with transfers, has not been ambulatory since fractured hip. Family requesting a hospital bed as patient has been falling out of the bed. Recommend HHPT and aid and hospital bed,    Follow Up Recommendations Home health PT;Supervision/Assistance - 24 hour    Equipment Recommendations  Hospital bed    Recommendations for Other Services       Precautions / Restrictions Precautions Precautions: Fall Restrictions Weight Bearing Restrictions: No      Mobility  Bed Mobility Overal bed mobility: Needs Assistance Bed Mobility: Supine to Sit     Supine to sit: Mod assist     General bed mobility comments: assist with trunk  Transfers Overall transfer level: Needs assistance Equipment used: Rolling walker (2 wheeled) Transfers: Sit to/from [employer] Transfers Sit to Stand: Mod assist Stand pivot transfers: Mod assist       General transfer comment: assist to rise, assist to turnt the RW as patient took small steps. Eventually did not step so assisted to turn to recliner. the patient fell back into recliner, did not flex at the hips to sit down.  Ambulation/Gait                Stairs            Wheelchair Mobility    Modified Rankin (Stroke Patients Only)       Balance Overall balance assessment: History of Falls;Needs assistance Sitting-balance support: Feet supported;Bilateral upper extremity supported Sitting balance-Leahy Scale: Poor     Standing balance support: During functional activity;Bilateral upper extremity supported Standing  balance-Leahy Scale: Poor                               Pertinent Vitals/Pain Pain Assessment: No/denies pain    Home Living Family/patient expects to be discharged to:: Private residence Living Arrangements: Spouse/significant other;Children Available Help at Discharge: Family Type of Home: House Home Access: Ramped entrance     Home Layout: One level Home Equipment: Environmental consultant - 2 wheels;Shower seat;Wheelchair - manual      Prior Function Level of Independence: Needs assistance   Gait / Transfers Assistance Needed: transfers only to [facility]           Hand Dominance        Extremity/Trunk Assessment   Upper Extremity Assessment Upper Extremity Assessment: Generalized weakness    Lower Extremity Assessment Lower Extremity Assessment: Generalized weakness       Communication      Cognition Arousal/Alertness: Awake/alert Behavior During Therapy: WFL for tasks assessed/performed Overall Cognitive Status: History of cognitive impairments - at baseline                                        General Comments      Exercises     Assessment/Plan    PT Assessment All further PT needs can be met in the next venue of care  PT Problem List Decreased strength;Decreased activity tolerance;Decreased balance;Decreased mobility;Decreased cognition       PT Treatment Interventions  PT Goals (Current goals can be found in the Care Plan section)  Acute Rehab PT Goals Patient Stated Goal: to go home PT Goal Formulation: All assessment and education complete, DC therapy    Frequency     Barriers to discharge        Co-evaluation               End of Session Equipment Utilized During Treatment: Gait belt Activity Tolerance: Patient tolerated treatment well Patient left: in chair;with call bell/phone within reach;with family/visitor present Nurse Communication: Mobility status PT Visit Diagnosis: Unsteadiness on feet  (R26.81);History of falling (Z91.81)    Time: 1130-1150 PT Time Calculation (min) (ACUTE ONLY): 20 min   Charges:   PT Evaluation $PT Eval Low Complexity: 1 Procedure     PT G Codes:   PT G-Codes **NOT FOR INPATIENT CLASS** Functional Assessment Tool Used: AM-PAC 6 Clicks Basic Mobility;Clinical judgement Functional Limitation: Mobility: Walking and moving around Mobility: Walking and Moving Around Current Status (W4136): At least 60 percent but less than 80 percent impaired, limited or restricted Mobility: Walking and Moving Around Goal Status [phone]): At least 20 percent but less than 40 percent impaired, limited or restricted  [facility] PT [phone]   Claretha Cooper 10/21/2016, 12:47 PM

## 2016-10-21 NOTE — Progress Notes (Signed)
Pt's daughter at bedside selected [facility] for HHR/NA/PT. Referral given to in house rep. Pt request a Hospital Bed. MD will need orders for Hospital bed, [facility] orders signed and a face to face please.

## 2016-10-21 NOTE — Progress Notes (Signed)
Patient ID: Brian Montgomery, male   DOB: 05-Jul-1936, 81 y.o.   MRN: [id number]  1 Day Post-Op Subjective: Pt admitted last night at request of family for observation.  No problems overnight.  A catheter was placed last night presumably due to patient's baseline incontinence.  Objective: Vital signs in last 24 hours: Temp:  [97.6 F (36.4 C)-98.3 F (36.8 C)] 98 F (36.7 C) (04/13 0648) Pulse Rate:  [67-106] 67 (04/13 0648) Resp:  [16-21] 18 (04/13 0648) BP: (106-158)/(59-88) 106/59 (04/13 0648) SpO2:  [100 %] 100 % (04/13 0648) Weight:  [70.3 kg (154 lb 15.7 oz)-78 kg (172 lb)] 70.3 kg (154 lb 15.7 oz) (04/12 2055)  Intake/Output from previous day: 04/12 0701 - 04/13 0700 In: 1483.8 [P.O.:210; I.V.:1273.8] Out: 700 [Urine:600; Blood:100] Intake/Output this shift: No intake/output data recorded.  Physical Exam:  General: Alert and oriented Abdomen: Soft, ND GU: Urine clear   Studies/Results: No results found.  Assessment/Plan: D/C catheter D/C home   LOS: 0 days   Ralynn San,LES 10/21/2016, 7:20 AM

## 2016-10-21 NOTE — Progress Notes (Signed)
Went over discharge instructions with patient and family.  All questions answered.  Prescriptions and paperwork given to patient wife.  HH needs set up.  VSS.  Pt wheeled out by NT

## 2016-10-21 NOTE — Discharge Summary (Signed)
Date of admission: 10/20/2016  Date of discharge: 10/21/2016  Admission diagnosis: Bladder cancer  Discharge diagnosis: Same as above  History and Physical: For full details, please see admission history and physical. Briefly, Brian Montgomery is a 81 y.o. year old patient with bladder cancer.    Hospital Course: He underwent cystoscopy with TUR of bladder lesion.  His procedure was uncomplicated.  He was admitted for observation overnight and his urine remained clear.  He was able to be discharged home the morning of POD # 1.  Disposition: Home  Discharge instruction: The patient was instructed to be ambulatory but told to refrain from heavy lifting, strenuous activity, or driving.   Discharge medications:  Allergies as of 10/21/2016   No Known Allergies     Medication List    STOP taking these medications   enoxaparin 30 MG/0.3ML injection Commonly known as:  LOVENOX   saccharomyces boulardii 250 MG capsule Commonly known as:  FLORASTOR     TAKE these medications   benzonatate 100 MG capsule Commonly known as:  TESSALON Take 1 capsule (100 mg total) by mouth 3 (three) times daily as needed for cough. What changed:  when to take this   carbidopa-levodopa 25-100 MG tablet Commonly known as:  SINEMET IR Take 1 tablet by mouth 3 (three) times daily.   citalopram 10 MG tablet Commonly known as:  CELEXA Take 1 tablet (10 mg total) by mouth daily.   donepezil 10 MG tablet Commonly known as:  ARICEPT Take 1 tablet (10 mg total) by mouth at bedtime.   Foam Cushion Misc Wheelchair cushion   HYDROcodone-acetaminophen 5-325 MG tablet Commonly known as:  NORCO/VICODIN Take 1-2 tablets by mouth every 6 (six) hours as needed for moderate pain. What changed:  how much to take  when to take this   lisinopril-hydrochlorothiazide 10-12.5 MG tablet Commonly known as:  PRINZIDE,ZESTORETIC TAKE 1 TABLET BY MOUTH DAILY   loperamide 2 MG capsule Commonly known as:  IMODIUM Take 1  capsule (2 mg total) by mouth as needed for diarrhea or loose stools. What changed:  when to take this   phenazopyridine 100 MG tablet Commonly known as:  PYRIDIUM Take 1 tablet (100 mg total) by mouth 3 (three) times daily as needed for pain (for burning). What changed:  when to take this   phenazopyridine 100 MG tablet Commonly known as:  PYRIDIUM Take 1 tablet (100 mg total) by mouth 3 (three) times daily as needed for pain (for burning). What changed:  You were already taking a medication with the same name, and this prescription was added. Make sure you understand how and when to take each.   pseudoephedrine-guaifenesin 60-600 MG 12 hr tablet Commonly known as:  MUCINEX D Take 1 tablet by mouth every 12 (twelve) hours.   simvastatin 20 MG tablet Commonly known as:  ZOCOR TAKE 1 TABLET BY MOUTH DAILY What changed:  See the new instructions.       Followup:  Follow-up Information    Kinjal Neitzke,LES, MD.   Specialty:  Urology Why:  11/04/16 at 8:30 AM Contact information: [place] [address] [phone]

## 2016-11-02 ENCOUNTER — Encounter: Payer: Self-pay | Admitting: Family Medicine

## 2016-11-02 ENCOUNTER — Ambulatory Visit (INDEPENDENT_AMBULATORY_CARE_PROVIDER_SITE_OTHER): Payer: Medicare Other | Admitting: Family Medicine

## 2016-11-02 VITALS — BP 110/60 | HR 72

## 2016-11-02 DIAGNOSIS — C61 Malignant neoplasm of prostate: Secondary | ICD-10-CM

## 2016-11-02 DIAGNOSIS — R531 Weakness: Secondary | ICD-10-CM | POA: Diagnosis not present

## 2016-11-02 DIAGNOSIS — C679 Malignant neoplasm of bladder, unspecified: Secondary | ICD-10-CM

## 2016-11-02 DIAGNOSIS — G231 Progressive supranuclear ophthalmoplegia [Steele-Richardson-Olszewski]: Secondary | ICD-10-CM | POA: Diagnosis not present

## 2016-11-02 DIAGNOSIS — S72002A Fracture of unspecified part of neck of left femur, initial encounter for closed fracture: Secondary | ICD-10-CM

## 2016-11-02 NOTE — Progress Notes (Signed)
   Subjective:    Patient ID: Brian Montgomery, male    DOB: May 13, 1936, 81 y.o.   MRN: [id number]  HPI He is here for consultation. He does have underlying history of bladder cancer as well as prostate cancer. He also fell and broke his hip in late December requiring a pin. Prior to that he did have difficulty with pneumonia. He most recently had her cancer surgery. He has been in [place] and was getting PT as well as OT. He is now at home and still needs continuation of PT, OT as well as nursing. He has difficulty with bathing, ambulation, bowel and bladder issues as he is unable to ambulate to get to the bathroom. He also has underlying neurologic issues and is on medication for that. His wife and daughters are helping take care of him. They're helping as much is they can but ambulation and getting him to a place that he can use commode is very difficult. He is now wearing depends.   Review of Systems     Objective:   Physical Exam Alert and in no distress a slightly flat affect       Assessment & Plan:  Malignant neoplasm of urinary bladder, unspecified site ([place])  Malignant neoplasm of prostate ([facility])  Weakness generalized  Closed fracture of left hip, initial encounter ([facility])  PSP (progressive supranuclear palsy) ([place]) Will make arrangements for home health to come up to help with PT, OT and general care.

## 2016-11-03 ENCOUNTER — Telehealth: Payer: Self-pay

## 2016-11-03 NOTE — Telephone Encounter (Signed)
Brian Montgomery called from [facility] about orders that were requested yesterday. She reports they do not have OT in [facility], but can have PT assess for those needs and then if needed refer to another agency. Please advise on your recommendations.   Dennison Bulla # [phone].  Thank you, Wells Guiles

## 2016-11-04 DIAGNOSIS — R310 Gross hematuria: Secondary | ICD-10-CM | POA: Diagnosis not present

## 2016-11-04 DIAGNOSIS — C61 Malignant neoplasm of prostate: Secondary | ICD-10-CM | POA: Diagnosis not present

## 2016-11-04 DIAGNOSIS — C678 Malignant neoplasm of overlapping sites of bladder: Secondary | ICD-10-CM | POA: Diagnosis not present

## 2016-11-04 NOTE — Telephone Encounter (Signed)
VO given to Baxter Flattery for PT to assess for OT needs. Victorino December

## 2016-11-04 NOTE — Telephone Encounter (Signed)
ok 

## 2016-11-07 ENCOUNTER — Telehealth: Payer: Self-pay | Admitting: Family Medicine

## 2016-11-07 NOTE — Telephone Encounter (Signed)
Caryl Pina with [facility] t# [phone], called & wanted you to know they recv'd orders for PT to start yesterday but pt requested starting care today not yesterday.

## 2016-11-08 DIAGNOSIS — Z993 Dependence on wheelchair: Secondary | ICD-10-CM | POA: Diagnosis not present

## 2016-11-08 DIAGNOSIS — Z8546 Personal history of malignant neoplasm of prostate: Secondary | ICD-10-CM | POA: Diagnosis not present

## 2016-11-08 DIAGNOSIS — Z8551 Personal history of malignant neoplasm of bladder: Secondary | ICD-10-CM | POA: Diagnosis not present

## 2016-11-08 DIAGNOSIS — Z8781 Personal history of (healed) traumatic fracture: Secondary | ICD-10-CM | POA: Diagnosis not present

## 2016-11-08 DIAGNOSIS — G231 Progressive supranuclear ophthalmoplegia [Steele-Richardson-Olszewski]: Secondary | ICD-10-CM | POA: Diagnosis not present

## 2016-11-08 DIAGNOSIS — Z9181 History of falling: Secondary | ICD-10-CM | POA: Diagnosis not present

## 2016-11-08 NOTE — Telephone Encounter (Signed)
I have gave Dr.Lalonde okay

## 2016-11-08 NOTE — Telephone Encounter (Signed)
Brian Montgomery with [facility] called to get verbal orders on this pt for PT 2x/wk for 4wk. She can be reached back at [phone].

## 2016-11-08 NOTE — Telephone Encounter (Signed)
ok 

## 2016-11-10 ENCOUNTER — Telehealth: Payer: Self-pay

## 2016-11-10 NOTE — Telephone Encounter (Signed)
Estill Bamberg from [facility] called to get AUTH. For pt 2 x a week for 4 weeks please advise

## 2016-11-10 NOTE — Telephone Encounter (Signed)
Okay 

## 2016-11-10 NOTE — Telephone Encounter (Signed)
Brian Montgomery has been informed okay

## 2016-11-14 DIAGNOSIS — Z993 Dependence on wheelchair: Secondary | ICD-10-CM | POA: Diagnosis not present

## 2016-11-14 DIAGNOSIS — Z8781 Personal history of (healed) traumatic fracture: Secondary | ICD-10-CM | POA: Diagnosis not present

## 2016-11-14 DIAGNOSIS — Z9181 History of falling: Secondary | ICD-10-CM | POA: Diagnosis not present

## 2016-11-14 DIAGNOSIS — Z8551 Personal history of malignant neoplasm of bladder: Secondary | ICD-10-CM | POA: Diagnosis not present

## 2016-11-14 DIAGNOSIS — Z8546 Personal history of malignant neoplasm of prostate: Secondary | ICD-10-CM | POA: Diagnosis not present

## 2016-11-14 DIAGNOSIS — G231 Progressive supranuclear ophthalmoplegia [Steele-Richardson-Olszewski]: Secondary | ICD-10-CM | POA: Diagnosis not present

## 2016-11-16 DIAGNOSIS — Z993 Dependence on wheelchair: Secondary | ICD-10-CM | POA: Diagnosis not present

## 2016-11-16 DIAGNOSIS — Z8781 Personal history of (healed) traumatic fracture: Secondary | ICD-10-CM | POA: Diagnosis not present

## 2016-11-16 DIAGNOSIS — Z8546 Personal history of malignant neoplasm of prostate: Secondary | ICD-10-CM | POA: Diagnosis not present

## 2016-11-16 DIAGNOSIS — Z9181 History of falling: Secondary | ICD-10-CM | POA: Diagnosis not present

## 2016-11-16 DIAGNOSIS — Z8551 Personal history of malignant neoplasm of bladder: Secondary | ICD-10-CM | POA: Diagnosis not present

## 2016-11-16 DIAGNOSIS — G231 Progressive supranuclear ophthalmoplegia [Steele-Richardson-Olszewski]: Secondary | ICD-10-CM | POA: Diagnosis not present

## 2016-11-18 DIAGNOSIS — Z993 Dependence on wheelchair: Secondary | ICD-10-CM | POA: Diagnosis not present

## 2016-11-18 DIAGNOSIS — Z8546 Personal history of malignant neoplasm of prostate: Secondary | ICD-10-CM | POA: Diagnosis not present

## 2016-11-18 DIAGNOSIS — G231 Progressive supranuclear ophthalmoplegia [Steele-Richardson-Olszewski]: Secondary | ICD-10-CM | POA: Diagnosis not present

## 2016-11-18 DIAGNOSIS — Z9181 History of falling: Secondary | ICD-10-CM | POA: Diagnosis not present

## 2016-11-18 DIAGNOSIS — Z8551 Personal history of malignant neoplasm of bladder: Secondary | ICD-10-CM | POA: Diagnosis not present

## 2016-11-18 DIAGNOSIS — Z8781 Personal history of (healed) traumatic fracture: Secondary | ICD-10-CM | POA: Diagnosis not present

## 2016-11-23 DIAGNOSIS — Z993 Dependence on wheelchair: Secondary | ICD-10-CM | POA: Diagnosis not present

## 2016-11-23 DIAGNOSIS — G231 Progressive supranuclear ophthalmoplegia [Steele-Richardson-Olszewski]: Secondary | ICD-10-CM | POA: Diagnosis not present

## 2016-11-23 DIAGNOSIS — Z9181 History of falling: Secondary | ICD-10-CM | POA: Diagnosis not present

## 2016-11-23 DIAGNOSIS — Z8781 Personal history of (healed) traumatic fracture: Secondary | ICD-10-CM | POA: Diagnosis not present

## 2016-11-23 DIAGNOSIS — Z8546 Personal history of malignant neoplasm of prostate: Secondary | ICD-10-CM | POA: Diagnosis not present

## 2016-11-23 DIAGNOSIS — Z8551 Personal history of malignant neoplasm of bladder: Secondary | ICD-10-CM | POA: Diagnosis not present

## 2016-11-25 DIAGNOSIS — Z8781 Personal history of (healed) traumatic fracture: Secondary | ICD-10-CM | POA: Diagnosis not present

## 2016-11-25 DIAGNOSIS — G231 Progressive supranuclear ophthalmoplegia [Steele-Richardson-Olszewski]: Secondary | ICD-10-CM | POA: Diagnosis not present

## 2016-11-25 DIAGNOSIS — Z9181 History of falling: Secondary | ICD-10-CM | POA: Diagnosis not present

## 2016-11-25 DIAGNOSIS — Z993 Dependence on wheelchair: Secondary | ICD-10-CM | POA: Diagnosis not present

## 2016-11-25 DIAGNOSIS — Z8551 Personal history of malignant neoplasm of bladder: Secondary | ICD-10-CM | POA: Diagnosis not present

## 2016-11-25 DIAGNOSIS — Z8546 Personal history of malignant neoplasm of prostate: Secondary | ICD-10-CM | POA: Diagnosis not present

## 2016-11-28 ENCOUNTER — Telehealth: Payer: Self-pay | Admitting: Family Medicine

## 2016-11-28 NOTE — Telephone Encounter (Signed)
ok 

## 2016-11-28 NOTE — Telephone Encounter (Signed)
Brian Montgomery has been informed

## 2016-11-28 NOTE — Telephone Encounter (Signed)
Brian Montgomery from [facility] is requesting to extend PT for 2 times a week for 4 weeks for strengthing  She can be reached at [phone]

## 2016-11-30 DIAGNOSIS — Z8551 Personal history of malignant neoplasm of bladder: Secondary | ICD-10-CM | POA: Diagnosis not present

## 2016-11-30 DIAGNOSIS — G231 Progressive supranuclear ophthalmoplegia [Steele-Richardson-Olszewski]: Secondary | ICD-10-CM | POA: Diagnosis not present

## 2016-11-30 DIAGNOSIS — Z9181 History of falling: Secondary | ICD-10-CM | POA: Diagnosis not present

## 2016-11-30 DIAGNOSIS — Z8781 Personal history of (healed) traumatic fracture: Secondary | ICD-10-CM | POA: Diagnosis not present

## 2016-11-30 DIAGNOSIS — Z993 Dependence on wheelchair: Secondary | ICD-10-CM | POA: Diagnosis not present

## 2016-11-30 DIAGNOSIS — Z8546 Personal history of malignant neoplasm of prostate: Secondary | ICD-10-CM | POA: Diagnosis not present

## 2016-12-01 ENCOUNTER — Other Ambulatory Visit: Payer: Self-pay | Admitting: Family Medicine

## 2016-12-01 NOTE — Telephone Encounter (Signed)
Pt has not had his cholesterol checked in forever, I sent it in for 30 days but rx came back for 90 day. Is it okay to refill for 90 days

## 2016-12-02 DIAGNOSIS — G231 Progressive supranuclear ophthalmoplegia [Steele-Richardson-Olszewski]: Secondary | ICD-10-CM | POA: Diagnosis not present

## 2016-12-02 DIAGNOSIS — Z8781 Personal history of (healed) traumatic fracture: Secondary | ICD-10-CM | POA: Diagnosis not present

## 2016-12-02 DIAGNOSIS — Z8551 Personal history of malignant neoplasm of bladder: Secondary | ICD-10-CM | POA: Diagnosis not present

## 2016-12-02 DIAGNOSIS — Z993 Dependence on wheelchair: Secondary | ICD-10-CM | POA: Diagnosis not present

## 2016-12-02 DIAGNOSIS — Z8546 Personal history of malignant neoplasm of prostate: Secondary | ICD-10-CM | POA: Diagnosis not present

## 2016-12-02 DIAGNOSIS — Z9181 History of falling: Secondary | ICD-10-CM | POA: Diagnosis not present

## 2016-12-07 DIAGNOSIS — G231 Progressive supranuclear ophthalmoplegia [Steele-Richardson-Olszewski]: Secondary | ICD-10-CM | POA: Diagnosis not present

## 2016-12-07 DIAGNOSIS — Z9181 History of falling: Secondary | ICD-10-CM | POA: Diagnosis not present

## 2016-12-07 DIAGNOSIS — Z8551 Personal history of malignant neoplasm of bladder: Secondary | ICD-10-CM | POA: Diagnosis not present

## 2016-12-07 DIAGNOSIS — Z8781 Personal history of (healed) traumatic fracture: Secondary | ICD-10-CM | POA: Diagnosis not present

## 2016-12-07 DIAGNOSIS — Z993 Dependence on wheelchair: Secondary | ICD-10-CM | POA: Diagnosis not present

## 2016-12-07 DIAGNOSIS — Z8546 Personal history of malignant neoplasm of prostate: Secondary | ICD-10-CM | POA: Diagnosis not present

## 2016-12-09 DIAGNOSIS — Z8781 Personal history of (healed) traumatic fracture: Secondary | ICD-10-CM | POA: Diagnosis not present

## 2016-12-09 DIAGNOSIS — Z9181 History of falling: Secondary | ICD-10-CM | POA: Diagnosis not present

## 2016-12-09 DIAGNOSIS — Z993 Dependence on wheelchair: Secondary | ICD-10-CM | POA: Diagnosis not present

## 2016-12-09 DIAGNOSIS — G231 Progressive supranuclear ophthalmoplegia [Steele-Richardson-Olszewski]: Secondary | ICD-10-CM | POA: Diagnosis not present

## 2016-12-09 DIAGNOSIS — Z8546 Personal history of malignant neoplasm of prostate: Secondary | ICD-10-CM | POA: Diagnosis not present

## 2016-12-09 DIAGNOSIS — Z8551 Personal history of malignant neoplasm of bladder: Secondary | ICD-10-CM | POA: Diagnosis not present

## 2016-12-14 ENCOUNTER — Telehealth: Payer: Self-pay

## 2016-12-14 DIAGNOSIS — Z8781 Personal history of (healed) traumatic fracture: Secondary | ICD-10-CM | POA: Diagnosis not present

## 2016-12-14 DIAGNOSIS — Z8546 Personal history of malignant neoplasm of prostate: Secondary | ICD-10-CM | POA: Diagnosis not present

## 2016-12-14 DIAGNOSIS — Z9181 History of falling: Secondary | ICD-10-CM | POA: Diagnosis not present

## 2016-12-14 DIAGNOSIS — Z993 Dependence on wheelchair: Secondary | ICD-10-CM | POA: Diagnosis not present

## 2016-12-14 DIAGNOSIS — Z8551 Personal history of malignant neoplasm of bladder: Secondary | ICD-10-CM | POA: Diagnosis not present

## 2016-12-14 DIAGNOSIS — G231 Progressive supranuclear ophthalmoplegia [Steele-Richardson-Olszewski]: Secondary | ICD-10-CM | POA: Diagnosis not present

## 2016-12-14 NOTE — Telephone Encounter (Signed)
Brian Montgomery, calling to request verbal for OT eval and treat for education on ADL training [phone].

## 2016-12-15 NOTE — Telephone Encounter (Signed)
ok 

## 2016-12-15 NOTE — Telephone Encounter (Signed)
Left word for word message its okay for ot and edu.on adl

## 2016-12-16 DIAGNOSIS — Z993 Dependence on wheelchair: Secondary | ICD-10-CM | POA: Diagnosis not present

## 2016-12-16 DIAGNOSIS — Z8781 Personal history of (healed) traumatic fracture: Secondary | ICD-10-CM | POA: Diagnosis not present

## 2016-12-16 DIAGNOSIS — Z8546 Personal history of malignant neoplasm of prostate: Secondary | ICD-10-CM | POA: Diagnosis not present

## 2016-12-16 DIAGNOSIS — Z8551 Personal history of malignant neoplasm of bladder: Secondary | ICD-10-CM | POA: Diagnosis not present

## 2016-12-16 DIAGNOSIS — Z9181 History of falling: Secondary | ICD-10-CM | POA: Diagnosis not present

## 2016-12-16 DIAGNOSIS — G231 Progressive supranuclear ophthalmoplegia [Steele-Richardson-Olszewski]: Secondary | ICD-10-CM | POA: Diagnosis not present

## 2016-12-16 NOTE — Addendum Note (Signed)
Addendum  created 12/16/16 1158 by Lyn Hollingshead, MD   Sign clinical note

## 2016-12-20 DIAGNOSIS — Z9181 History of falling: Secondary | ICD-10-CM | POA: Diagnosis not present

## 2016-12-20 DIAGNOSIS — Z8546 Personal history of malignant neoplasm of prostate: Secondary | ICD-10-CM | POA: Diagnosis not present

## 2016-12-20 DIAGNOSIS — Z8781 Personal history of (healed) traumatic fracture: Secondary | ICD-10-CM | POA: Diagnosis not present

## 2016-12-20 DIAGNOSIS — G231 Progressive supranuclear ophthalmoplegia [Steele-Richardson-Olszewski]: Secondary | ICD-10-CM | POA: Diagnosis not present

## 2016-12-20 DIAGNOSIS — Z8551 Personal history of malignant neoplasm of bladder: Secondary | ICD-10-CM | POA: Diagnosis not present

## 2016-12-20 DIAGNOSIS — Z993 Dependence on wheelchair: Secondary | ICD-10-CM | POA: Diagnosis not present

## 2016-12-22 ENCOUNTER — Telehealth: Payer: Self-pay | Admitting: Family Medicine

## 2016-12-22 DIAGNOSIS — G231 Progressive supranuclear ophthalmoplegia [Steele-Richardson-Olszewski]: Secondary | ICD-10-CM | POA: Diagnosis not present

## 2016-12-22 DIAGNOSIS — Z8546 Personal history of malignant neoplasm of prostate: Secondary | ICD-10-CM | POA: Diagnosis not present

## 2016-12-22 DIAGNOSIS — Z993 Dependence on wheelchair: Secondary | ICD-10-CM | POA: Diagnosis not present

## 2016-12-22 DIAGNOSIS — Z8551 Personal history of malignant neoplasm of bladder: Secondary | ICD-10-CM | POA: Diagnosis not present

## 2016-12-22 DIAGNOSIS — Z9181 History of falling: Secondary | ICD-10-CM | POA: Diagnosis not present

## 2016-12-22 DIAGNOSIS — Z8781 Personal history of (healed) traumatic fracture: Secondary | ICD-10-CM | POA: Diagnosis not present

## 2016-12-22 NOTE — Telephone Encounter (Signed)
Brian Montgomery has been informed Dr.Lalonde said okay

## 2016-12-22 NOTE — Telephone Encounter (Signed)
Brian Montgomery from [facility] called requesting a verbal for  more Time for OT, she would like one time a week for one week and 2 times a week for 2 weeks she can be reached at [phone]

## 2016-12-22 NOTE — Telephone Encounter (Signed)
ok 

## 2016-12-23 DIAGNOSIS — Z9181 History of falling: Secondary | ICD-10-CM | POA: Diagnosis not present

## 2016-12-23 DIAGNOSIS — Z8551 Personal history of malignant neoplasm of bladder: Secondary | ICD-10-CM | POA: Diagnosis not present

## 2016-12-23 DIAGNOSIS — Z8546 Personal history of malignant neoplasm of prostate: Secondary | ICD-10-CM | POA: Diagnosis not present

## 2016-12-23 DIAGNOSIS — G231 Progressive supranuclear ophthalmoplegia [Steele-Richardson-Olszewski]: Secondary | ICD-10-CM | POA: Diagnosis not present

## 2016-12-23 DIAGNOSIS — Z993 Dependence on wheelchair: Secondary | ICD-10-CM | POA: Diagnosis not present

## 2016-12-23 DIAGNOSIS — Z8781 Personal history of (healed) traumatic fracture: Secondary | ICD-10-CM | POA: Diagnosis not present

## 2016-12-26 DIAGNOSIS — Z8781 Personal history of (healed) traumatic fracture: Secondary | ICD-10-CM | POA: Diagnosis not present

## 2016-12-26 DIAGNOSIS — Z8546 Personal history of malignant neoplasm of prostate: Secondary | ICD-10-CM | POA: Diagnosis not present

## 2016-12-26 DIAGNOSIS — Z993 Dependence on wheelchair: Secondary | ICD-10-CM | POA: Diagnosis not present

## 2016-12-26 DIAGNOSIS — G231 Progressive supranuclear ophthalmoplegia [Steele-Richardson-Olszewski]: Secondary | ICD-10-CM | POA: Diagnosis not present

## 2016-12-26 DIAGNOSIS — Z8551 Personal history of malignant neoplasm of bladder: Secondary | ICD-10-CM | POA: Diagnosis not present

## 2016-12-26 DIAGNOSIS — Z9181 History of falling: Secondary | ICD-10-CM | POA: Diagnosis not present

## 2016-12-30 DIAGNOSIS — Z993 Dependence on wheelchair: Secondary | ICD-10-CM | POA: Diagnosis not present

## 2016-12-30 DIAGNOSIS — G231 Progressive supranuclear ophthalmoplegia [Steele-Richardson-Olszewski]: Secondary | ICD-10-CM | POA: Diagnosis not present

## 2016-12-30 DIAGNOSIS — Z8781 Personal history of (healed) traumatic fracture: Secondary | ICD-10-CM | POA: Diagnosis not present

## 2016-12-30 DIAGNOSIS — Z8551 Personal history of malignant neoplasm of bladder: Secondary | ICD-10-CM | POA: Diagnosis not present

## 2016-12-30 DIAGNOSIS — Z9181 History of falling: Secondary | ICD-10-CM | POA: Diagnosis not present

## 2016-12-30 DIAGNOSIS — Z8546 Personal history of malignant neoplasm of prostate: Secondary | ICD-10-CM | POA: Diagnosis not present

## 2017-01-03 ENCOUNTER — Telehealth: Payer: Self-pay

## 2017-01-03 DIAGNOSIS — Z8551 Personal history of malignant neoplasm of bladder: Secondary | ICD-10-CM | POA: Diagnosis not present

## 2017-01-03 DIAGNOSIS — Z8546 Personal history of malignant neoplasm of prostate: Secondary | ICD-10-CM | POA: Diagnosis not present

## 2017-01-03 DIAGNOSIS — Z993 Dependence on wheelchair: Secondary | ICD-10-CM | POA: Diagnosis not present

## 2017-01-03 DIAGNOSIS — Z8781 Personal history of (healed) traumatic fracture: Secondary | ICD-10-CM | POA: Diagnosis not present

## 2017-01-03 DIAGNOSIS — Z9181 History of falling: Secondary | ICD-10-CM | POA: Diagnosis not present

## 2017-01-03 DIAGNOSIS — G231 Progressive supranuclear ophthalmoplegia [Steele-Richardson-Olszewski]: Secondary | ICD-10-CM | POA: Diagnosis not present

## 2017-01-03 NOTE — Telephone Encounter (Signed)
VO given to [place]. Victorino December

## 2017-01-03 NOTE — Telephone Encounter (Signed)
Give verbal for therapy but the home health certification will need to be signed by Dr. Redmond School when he gets back

## 2017-01-03 NOTE — Telephone Encounter (Signed)
His home health re-cert is coming up and they needed to renew current orders.

## 2017-01-03 NOTE — Telephone Encounter (Signed)
What is the diagnosis, reason?

## 2017-01-03 NOTE — Telephone Encounter (Signed)
[name], [place] with [facility] called to get continued orders for OT 2x/wk for 4 weeks starting July 1st. CB # [phone]

## 2017-01-06 DIAGNOSIS — Z8551 Personal history of malignant neoplasm of bladder: Secondary | ICD-10-CM | POA: Diagnosis not present

## 2017-01-06 DIAGNOSIS — Z9181 History of falling: Secondary | ICD-10-CM | POA: Diagnosis not present

## 2017-01-06 DIAGNOSIS — Z8546 Personal history of malignant neoplasm of prostate: Secondary | ICD-10-CM | POA: Diagnosis not present

## 2017-01-06 DIAGNOSIS — Z993 Dependence on wheelchair: Secondary | ICD-10-CM | POA: Diagnosis not present

## 2017-01-06 DIAGNOSIS — G231 Progressive supranuclear ophthalmoplegia [Steele-Richardson-Olszewski]: Secondary | ICD-10-CM | POA: Diagnosis not present

## 2017-01-06 DIAGNOSIS — Z8781 Personal history of (healed) traumatic fracture: Secondary | ICD-10-CM | POA: Diagnosis not present

## 2017-01-07 DIAGNOSIS — Z8781 Personal history of (healed) traumatic fracture: Secondary | ICD-10-CM | POA: Diagnosis not present

## 2017-01-07 DIAGNOSIS — G231 Progressive supranuclear ophthalmoplegia [Steele-Richardson-Olszewski]: Secondary | ICD-10-CM | POA: Diagnosis not present

## 2017-01-07 DIAGNOSIS — Z993 Dependence on wheelchair: Secondary | ICD-10-CM | POA: Diagnosis not present

## 2017-01-07 DIAGNOSIS — Z9181 History of falling: Secondary | ICD-10-CM | POA: Diagnosis not present

## 2017-01-07 DIAGNOSIS — Z8551 Personal history of malignant neoplasm of bladder: Secondary | ICD-10-CM | POA: Diagnosis not present

## 2017-01-07 DIAGNOSIS — Z8546 Personal history of malignant neoplasm of prostate: Secondary | ICD-10-CM | POA: Diagnosis not present

## 2017-01-07 DIAGNOSIS — Z79891 Long term (current) use of opiate analgesic: Secondary | ICD-10-CM | POA: Diagnosis not present

## 2017-01-10 DIAGNOSIS — Z8551 Personal history of malignant neoplasm of bladder: Secondary | ICD-10-CM | POA: Diagnosis not present

## 2017-01-10 DIAGNOSIS — Z8781 Personal history of (healed) traumatic fracture: Secondary | ICD-10-CM | POA: Diagnosis not present

## 2017-01-10 DIAGNOSIS — Z8546 Personal history of malignant neoplasm of prostate: Secondary | ICD-10-CM | POA: Diagnosis not present

## 2017-01-10 DIAGNOSIS — Z9181 History of falling: Secondary | ICD-10-CM | POA: Diagnosis not present

## 2017-01-10 DIAGNOSIS — G231 Progressive supranuclear ophthalmoplegia [Steele-Richardson-Olszewski]: Secondary | ICD-10-CM | POA: Diagnosis not present

## 2017-01-10 DIAGNOSIS — Z993 Dependence on wheelchair: Secondary | ICD-10-CM | POA: Diagnosis not present

## 2017-01-13 DIAGNOSIS — Z8546 Personal history of malignant neoplasm of prostate: Secondary | ICD-10-CM | POA: Diagnosis not present

## 2017-01-13 DIAGNOSIS — Z8781 Personal history of (healed) traumatic fracture: Secondary | ICD-10-CM | POA: Diagnosis not present

## 2017-01-13 DIAGNOSIS — Z8551 Personal history of malignant neoplasm of bladder: Secondary | ICD-10-CM | POA: Diagnosis not present

## 2017-01-13 DIAGNOSIS — G231 Progressive supranuclear ophthalmoplegia [Steele-Richardson-Olszewski]: Secondary | ICD-10-CM | POA: Diagnosis not present

## 2017-01-13 DIAGNOSIS — Z993 Dependence on wheelchair: Secondary | ICD-10-CM | POA: Diagnosis not present

## 2017-01-13 DIAGNOSIS — Z9181 History of falling: Secondary | ICD-10-CM | POA: Diagnosis not present

## 2017-01-14 ENCOUNTER — Other Ambulatory Visit: Payer: Self-pay | Admitting: Family Medicine

## 2017-01-17 DIAGNOSIS — G231 Progressive supranuclear ophthalmoplegia [Steele-Richardson-Olszewski]: Secondary | ICD-10-CM | POA: Diagnosis not present

## 2017-01-17 DIAGNOSIS — Z8546 Personal history of malignant neoplasm of prostate: Secondary | ICD-10-CM | POA: Diagnosis not present

## 2017-01-17 DIAGNOSIS — Z8551 Personal history of malignant neoplasm of bladder: Secondary | ICD-10-CM | POA: Diagnosis not present

## 2017-01-17 DIAGNOSIS — Z993 Dependence on wheelchair: Secondary | ICD-10-CM | POA: Diagnosis not present

## 2017-01-17 DIAGNOSIS — Z8781 Personal history of (healed) traumatic fracture: Secondary | ICD-10-CM | POA: Diagnosis not present

## 2017-01-17 DIAGNOSIS — Z9181 History of falling: Secondary | ICD-10-CM | POA: Diagnosis not present

## 2017-01-18 ENCOUNTER — Telehealth: Payer: Self-pay | Admitting: Family Medicine

## 2017-01-18 NOTE — Telephone Encounter (Signed)
[name] from [facility] called requesting verbal orders for a home health aide for 2  Times a week for 2 weeks she can be reached at [phone]

## 2017-01-18 NOTE — Telephone Encounter (Signed)
ok 

## 2017-01-18 NOTE — Telephone Encounter (Signed)
Selinda Eon was notified of verbal ok

## 2017-01-20 ENCOUNTER — Telehealth: Payer: Self-pay | Admitting: Family Medicine

## 2017-01-20 DIAGNOSIS — Z8546 Personal history of malignant neoplasm of prostate: Secondary | ICD-10-CM | POA: Diagnosis not present

## 2017-01-20 DIAGNOSIS — Z993 Dependence on wheelchair: Secondary | ICD-10-CM | POA: Diagnosis not present

## 2017-01-20 DIAGNOSIS — Z9181 History of falling: Secondary | ICD-10-CM | POA: Diagnosis not present

## 2017-01-20 DIAGNOSIS — Z8551 Personal history of malignant neoplasm of bladder: Secondary | ICD-10-CM | POA: Diagnosis not present

## 2017-01-20 DIAGNOSIS — G231 Progressive supranuclear ophthalmoplegia [Steele-Richardson-Olszewski]: Secondary | ICD-10-CM | POA: Diagnosis not present

## 2017-01-20 DIAGNOSIS — Z8781 Personal history of (healed) traumatic fracture: Secondary | ICD-10-CM | POA: Diagnosis not present

## 2017-01-20 NOTE — Telephone Encounter (Signed)
Michelle from [facility] called and wanted to let you know that she was going to refer him to getting a new custom wheelchair the one that he has is not very safe and is at risk for falling out of the chair, states she just wanted to let you know that his wife will be calling make a appt to come in to discuss this, if any questions michelle can be reached at [phone]

## 2017-01-24 DIAGNOSIS — Z993 Dependence on wheelchair: Secondary | ICD-10-CM | POA: Diagnosis not present

## 2017-01-24 DIAGNOSIS — G231 Progressive supranuclear ophthalmoplegia [Steele-Richardson-Olszewski]: Secondary | ICD-10-CM | POA: Diagnosis not present

## 2017-01-24 DIAGNOSIS — Z9181 History of falling: Secondary | ICD-10-CM | POA: Diagnosis not present

## 2017-01-24 DIAGNOSIS — Z8781 Personal history of (healed) traumatic fracture: Secondary | ICD-10-CM | POA: Diagnosis not present

## 2017-01-24 DIAGNOSIS — Z8546 Personal history of malignant neoplasm of prostate: Secondary | ICD-10-CM | POA: Diagnosis not present

## 2017-01-24 DIAGNOSIS — Z8551 Personal history of malignant neoplasm of bladder: Secondary | ICD-10-CM | POA: Diagnosis not present

## 2017-01-27 DIAGNOSIS — Z8551 Personal history of malignant neoplasm of bladder: Secondary | ICD-10-CM | POA: Diagnosis not present

## 2017-01-27 DIAGNOSIS — Z8781 Personal history of (healed) traumatic fracture: Secondary | ICD-10-CM | POA: Diagnosis not present

## 2017-01-27 DIAGNOSIS — G231 Progressive supranuclear ophthalmoplegia [Steele-Richardson-Olszewski]: Secondary | ICD-10-CM | POA: Diagnosis not present

## 2017-01-27 DIAGNOSIS — Z8546 Personal history of malignant neoplasm of prostate: Secondary | ICD-10-CM | POA: Diagnosis not present

## 2017-01-27 DIAGNOSIS — Z993 Dependence on wheelchair: Secondary | ICD-10-CM | POA: Diagnosis not present

## 2017-01-27 DIAGNOSIS — Z9181 History of falling: Secondary | ICD-10-CM | POA: Diagnosis not present

## 2017-01-30 ENCOUNTER — Telehealth: Payer: Self-pay | Admitting: Family Medicine

## 2017-01-30 DIAGNOSIS — C678 Malignant neoplasm of overlapping sites of bladder: Secondary | ICD-10-CM | POA: Diagnosis not present

## 2017-01-30 NOTE — Telephone Encounter (Signed)
ok 

## 2017-01-30 NOTE — Telephone Encounter (Signed)
Verbal order given to [name] to continue therapy.

## 2017-01-30 NOTE — Telephone Encounter (Signed)
Brian Montgomery from [facility] home care would like to get verbal orders to continue for the  home health aide for 2 times a week for 4 weeks she can be reached at [phone]

## 2017-01-31 DIAGNOSIS — Z8551 Personal history of malignant neoplasm of bladder: Secondary | ICD-10-CM | POA: Diagnosis not present

## 2017-01-31 DIAGNOSIS — Z8781 Personal history of (healed) traumatic fracture: Secondary | ICD-10-CM | POA: Diagnosis not present

## 2017-01-31 DIAGNOSIS — Z9181 History of falling: Secondary | ICD-10-CM | POA: Diagnosis not present

## 2017-01-31 DIAGNOSIS — Z8546 Personal history of malignant neoplasm of prostate: Secondary | ICD-10-CM | POA: Diagnosis not present

## 2017-01-31 DIAGNOSIS — G231 Progressive supranuclear ophthalmoplegia [Steele-Richardson-Olszewski]: Secondary | ICD-10-CM | POA: Diagnosis not present

## 2017-01-31 DIAGNOSIS — Z993 Dependence on wheelchair: Secondary | ICD-10-CM | POA: Diagnosis not present

## 2017-02-01 DIAGNOSIS — Z8546 Personal history of malignant neoplasm of prostate: Secondary | ICD-10-CM | POA: Diagnosis not present

## 2017-02-01 DIAGNOSIS — G231 Progressive supranuclear ophthalmoplegia [Steele-Richardson-Olszewski]: Secondary | ICD-10-CM | POA: Diagnosis not present

## 2017-02-01 DIAGNOSIS — Z9181 History of falling: Secondary | ICD-10-CM | POA: Diagnosis not present

## 2017-02-01 DIAGNOSIS — Z993 Dependence on wheelchair: Secondary | ICD-10-CM | POA: Diagnosis not present

## 2017-02-01 DIAGNOSIS — Z8551 Personal history of malignant neoplasm of bladder: Secondary | ICD-10-CM | POA: Diagnosis not present

## 2017-02-01 DIAGNOSIS — Z8781 Personal history of (healed) traumatic fracture: Secondary | ICD-10-CM | POA: Diagnosis not present

## 2017-02-03 DIAGNOSIS — G231 Progressive supranuclear ophthalmoplegia [Steele-Richardson-Olszewski]: Secondary | ICD-10-CM | POA: Diagnosis not present

## 2017-02-03 DIAGNOSIS — Z8781 Personal history of (healed) traumatic fracture: Secondary | ICD-10-CM | POA: Diagnosis not present

## 2017-02-03 DIAGNOSIS — Z993 Dependence on wheelchair: Secondary | ICD-10-CM | POA: Diagnosis not present

## 2017-02-03 DIAGNOSIS — Z9181 History of falling: Secondary | ICD-10-CM | POA: Diagnosis not present

## 2017-02-03 DIAGNOSIS — Z8546 Personal history of malignant neoplasm of prostate: Secondary | ICD-10-CM | POA: Diagnosis not present

## 2017-02-03 DIAGNOSIS — Z8551 Personal history of malignant neoplasm of bladder: Secondary | ICD-10-CM | POA: Diagnosis not present

## 2017-02-06 ENCOUNTER — Telehealth: Payer: Self-pay | Admitting: Family Medicine

## 2017-02-06 DIAGNOSIS — Z8551 Personal history of malignant neoplasm of bladder: Secondary | ICD-10-CM | POA: Diagnosis not present

## 2017-02-06 DIAGNOSIS — Z9181 History of falling: Secondary | ICD-10-CM | POA: Diagnosis not present

## 2017-02-06 DIAGNOSIS — Z8546 Personal history of malignant neoplasm of prostate: Secondary | ICD-10-CM | POA: Diagnosis not present

## 2017-02-06 DIAGNOSIS — G231 Progressive supranuclear ophthalmoplegia [Steele-Richardson-Olszewski]: Secondary | ICD-10-CM | POA: Diagnosis not present

## 2017-02-06 DIAGNOSIS — Z993 Dependence on wheelchair: Secondary | ICD-10-CM | POA: Diagnosis not present

## 2017-02-06 DIAGNOSIS — Z8781 Personal history of (healed) traumatic fracture: Secondary | ICD-10-CM | POA: Diagnosis not present

## 2017-02-06 NOTE — Telephone Encounter (Signed)
[name] was informed okay per [employer]

## 2017-02-06 NOTE — Telephone Encounter (Signed)
Occupational Therapist,Michelle, called requesting verbal orders to do home health Speech Therapy on pt. He has been cough a lot especially when he is drinking.

## 2017-02-06 NOTE — Telephone Encounter (Signed)
ok 

## 2017-02-07 DIAGNOSIS — Z8781 Personal history of (healed) traumatic fracture: Secondary | ICD-10-CM | POA: Diagnosis not present

## 2017-02-07 DIAGNOSIS — Z8546 Personal history of malignant neoplasm of prostate: Secondary | ICD-10-CM | POA: Diagnosis not present

## 2017-02-07 DIAGNOSIS — Z8551 Personal history of malignant neoplasm of bladder: Secondary | ICD-10-CM | POA: Diagnosis not present

## 2017-02-07 DIAGNOSIS — Z993 Dependence on wheelchair: Secondary | ICD-10-CM | POA: Diagnosis not present

## 2017-02-07 DIAGNOSIS — G231 Progressive supranuclear ophthalmoplegia [Steele-Richardson-Olszewski]: Secondary | ICD-10-CM | POA: Diagnosis not present

## 2017-02-07 DIAGNOSIS — Z9181 History of falling: Secondary | ICD-10-CM | POA: Diagnosis not present

## 2017-02-08 ENCOUNTER — Telehealth: Payer: Self-pay | Admitting: Family Medicine

## 2017-02-08 DIAGNOSIS — Z8551 Personal history of malignant neoplasm of bladder: Secondary | ICD-10-CM | POA: Diagnosis not present

## 2017-02-08 DIAGNOSIS — G231 Progressive supranuclear ophthalmoplegia [Steele-Richardson-Olszewski]: Secondary | ICD-10-CM | POA: Diagnosis not present

## 2017-02-08 DIAGNOSIS — Z9181 History of falling: Secondary | ICD-10-CM | POA: Diagnosis not present

## 2017-02-08 DIAGNOSIS — Z8781 Personal history of (healed) traumatic fracture: Secondary | ICD-10-CM | POA: Diagnosis not present

## 2017-02-08 DIAGNOSIS — Z993 Dependence on wheelchair: Secondary | ICD-10-CM | POA: Diagnosis not present

## 2017-02-08 DIAGNOSIS — Z8546 Personal history of malignant neoplasm of prostate: Secondary | ICD-10-CM | POA: Diagnosis not present

## 2017-02-08 NOTE — Telephone Encounter (Signed)
Brian Montgomery - speech therapist called and has completed his speech evaluation.  Brian wants a verbal for 2 x week for 2 weeks and the 1 x week for 1 week.  Patient is getting strangled with water.  Please call Brian [phone]

## 2017-02-08 NOTE — Telephone Encounter (Signed)
[place] made aware. Brian Montgomery

## 2017-02-08 NOTE — Telephone Encounter (Signed)
ok 

## 2017-02-09 ENCOUNTER — Ambulatory Visit: Payer: Medicare Other | Admitting: Neurology

## 2017-02-09 DIAGNOSIS — N1330 Unspecified hydronephrosis: Secondary | ICD-10-CM | POA: Diagnosis not present

## 2017-02-09 DIAGNOSIS — C679 Malignant neoplasm of bladder, unspecified: Secondary | ICD-10-CM | POA: Diagnosis not present

## 2017-02-09 DIAGNOSIS — C678 Malignant neoplasm of overlapping sites of bladder: Secondary | ICD-10-CM | POA: Diagnosis not present

## 2017-02-10 DIAGNOSIS — Z9181 History of falling: Secondary | ICD-10-CM | POA: Diagnosis not present

## 2017-02-10 DIAGNOSIS — Z993 Dependence on wheelchair: Secondary | ICD-10-CM | POA: Diagnosis not present

## 2017-02-10 DIAGNOSIS — Z8546 Personal history of malignant neoplasm of prostate: Secondary | ICD-10-CM | POA: Diagnosis not present

## 2017-02-10 DIAGNOSIS — Z8781 Personal history of (healed) traumatic fracture: Secondary | ICD-10-CM | POA: Diagnosis not present

## 2017-02-10 DIAGNOSIS — Z8551 Personal history of malignant neoplasm of bladder: Secondary | ICD-10-CM | POA: Diagnosis not present

## 2017-02-10 DIAGNOSIS — G231 Progressive supranuclear ophthalmoplegia [Steele-Richardson-Olszewski]: Secondary | ICD-10-CM | POA: Diagnosis not present

## 2017-02-13 DIAGNOSIS — Z9181 History of falling: Secondary | ICD-10-CM | POA: Diagnosis not present

## 2017-02-13 DIAGNOSIS — Z8551 Personal history of malignant neoplasm of bladder: Secondary | ICD-10-CM | POA: Diagnosis not present

## 2017-02-13 DIAGNOSIS — Z8781 Personal history of (healed) traumatic fracture: Secondary | ICD-10-CM | POA: Diagnosis not present

## 2017-02-13 DIAGNOSIS — Z8546 Personal history of malignant neoplasm of prostate: Secondary | ICD-10-CM | POA: Diagnosis not present

## 2017-02-13 DIAGNOSIS — G231 Progressive supranuclear ophthalmoplegia [Steele-Richardson-Olszewski]: Secondary | ICD-10-CM | POA: Diagnosis not present

## 2017-02-13 DIAGNOSIS — Z993 Dependence on wheelchair: Secondary | ICD-10-CM | POA: Diagnosis not present

## 2017-02-14 ENCOUNTER — Other Ambulatory Visit: Payer: Self-pay | Admitting: Neurology

## 2017-02-14 DIAGNOSIS — Z8546 Personal history of malignant neoplasm of prostate: Secondary | ICD-10-CM | POA: Diagnosis not present

## 2017-02-14 DIAGNOSIS — Z993 Dependence on wheelchair: Secondary | ICD-10-CM | POA: Diagnosis not present

## 2017-02-14 DIAGNOSIS — Z8551 Personal history of malignant neoplasm of bladder: Secondary | ICD-10-CM | POA: Diagnosis not present

## 2017-02-14 DIAGNOSIS — G231 Progressive supranuclear ophthalmoplegia [Steele-Richardson-Olszewski]: Secondary | ICD-10-CM | POA: Diagnosis not present

## 2017-02-14 DIAGNOSIS — Z8781 Personal history of (healed) traumatic fracture: Secondary | ICD-10-CM | POA: Diagnosis not present

## 2017-02-14 DIAGNOSIS — Z9181 History of falling: Secondary | ICD-10-CM | POA: Diagnosis not present

## 2017-02-15 DIAGNOSIS — N130 Hydronephrosis with ureteropelvic junction obstruction: Secondary | ICD-10-CM | POA: Diagnosis not present

## 2017-02-15 DIAGNOSIS — C61 Malignant neoplasm of prostate: Secondary | ICD-10-CM | POA: Diagnosis not present

## 2017-02-15 DIAGNOSIS — C678 Malignant neoplasm of overlapping sites of bladder: Secondary | ICD-10-CM | POA: Diagnosis not present

## 2017-02-15 DIAGNOSIS — N3000 Acute cystitis without hematuria: Secondary | ICD-10-CM | POA: Diagnosis not present

## 2017-02-17 DIAGNOSIS — Z8546 Personal history of malignant neoplasm of prostate: Secondary | ICD-10-CM | POA: Diagnosis not present

## 2017-02-17 DIAGNOSIS — Z8551 Personal history of malignant neoplasm of bladder: Secondary | ICD-10-CM | POA: Diagnosis not present

## 2017-02-17 DIAGNOSIS — Z9181 History of falling: Secondary | ICD-10-CM | POA: Diagnosis not present

## 2017-02-17 DIAGNOSIS — G231 Progressive supranuclear ophthalmoplegia [Steele-Richardson-Olszewski]: Secondary | ICD-10-CM | POA: Diagnosis not present

## 2017-02-17 DIAGNOSIS — Z993 Dependence on wheelchair: Secondary | ICD-10-CM | POA: Diagnosis not present

## 2017-02-17 DIAGNOSIS — Z8781 Personal history of (healed) traumatic fracture: Secondary | ICD-10-CM | POA: Diagnosis not present

## 2017-02-20 DIAGNOSIS — G231 Progressive supranuclear ophthalmoplegia [Steele-Richardson-Olszewski]: Secondary | ICD-10-CM | POA: Diagnosis not present

## 2017-02-20 DIAGNOSIS — Z9181 History of falling: Secondary | ICD-10-CM | POA: Diagnosis not present

## 2017-02-20 DIAGNOSIS — Z8781 Personal history of (healed) traumatic fracture: Secondary | ICD-10-CM | POA: Diagnosis not present

## 2017-02-20 DIAGNOSIS — Z8551 Personal history of malignant neoplasm of bladder: Secondary | ICD-10-CM | POA: Diagnosis not present

## 2017-02-20 DIAGNOSIS — Z8546 Personal history of malignant neoplasm of prostate: Secondary | ICD-10-CM | POA: Diagnosis not present

## 2017-02-20 DIAGNOSIS — Z993 Dependence on wheelchair: Secondary | ICD-10-CM | POA: Diagnosis not present

## 2017-02-21 DIAGNOSIS — Z993 Dependence on wheelchair: Secondary | ICD-10-CM | POA: Diagnosis not present

## 2017-02-21 DIAGNOSIS — Z8781 Personal history of (healed) traumatic fracture: Secondary | ICD-10-CM | POA: Diagnosis not present

## 2017-02-21 DIAGNOSIS — Z8551 Personal history of malignant neoplasm of bladder: Secondary | ICD-10-CM | POA: Diagnosis not present

## 2017-02-21 DIAGNOSIS — Z8546 Personal history of malignant neoplasm of prostate: Secondary | ICD-10-CM | POA: Diagnosis not present

## 2017-02-21 DIAGNOSIS — G231 Progressive supranuclear ophthalmoplegia [Steele-Richardson-Olszewski]: Secondary | ICD-10-CM | POA: Diagnosis not present

## 2017-02-21 DIAGNOSIS — Z9181 History of falling: Secondary | ICD-10-CM | POA: Diagnosis not present

## 2017-02-22 ENCOUNTER — Other Ambulatory Visit: Payer: Self-pay | Admitting: Urology

## 2017-02-23 DIAGNOSIS — Z993 Dependence on wheelchair: Secondary | ICD-10-CM | POA: Diagnosis not present

## 2017-02-23 DIAGNOSIS — Z9181 History of falling: Secondary | ICD-10-CM | POA: Diagnosis not present

## 2017-02-23 DIAGNOSIS — G231 Progressive supranuclear ophthalmoplegia [Steele-Richardson-Olszewski]: Secondary | ICD-10-CM | POA: Diagnosis not present

## 2017-02-23 DIAGNOSIS — Z8546 Personal history of malignant neoplasm of prostate: Secondary | ICD-10-CM | POA: Diagnosis not present

## 2017-02-23 DIAGNOSIS — Z8781 Personal history of (healed) traumatic fracture: Secondary | ICD-10-CM | POA: Diagnosis not present

## 2017-02-23 DIAGNOSIS — Z8551 Personal history of malignant neoplasm of bladder: Secondary | ICD-10-CM | POA: Diagnosis not present

## 2017-02-24 DIAGNOSIS — Z8551 Personal history of malignant neoplasm of bladder: Secondary | ICD-10-CM | POA: Diagnosis not present

## 2017-02-24 DIAGNOSIS — Z9181 History of falling: Secondary | ICD-10-CM | POA: Diagnosis not present

## 2017-02-24 DIAGNOSIS — G231 Progressive supranuclear ophthalmoplegia [Steele-Richardson-Olszewski]: Secondary | ICD-10-CM | POA: Diagnosis not present

## 2017-02-24 DIAGNOSIS — Z8781 Personal history of (healed) traumatic fracture: Secondary | ICD-10-CM | POA: Diagnosis not present

## 2017-02-24 DIAGNOSIS — Z993 Dependence on wheelchair: Secondary | ICD-10-CM | POA: Diagnosis not present

## 2017-02-24 DIAGNOSIS — Z8546 Personal history of malignant neoplasm of prostate: Secondary | ICD-10-CM | POA: Diagnosis not present

## 2017-02-27 DIAGNOSIS — Z8551 Personal history of malignant neoplasm of bladder: Secondary | ICD-10-CM | POA: Diagnosis not present

## 2017-02-27 DIAGNOSIS — G231 Progressive supranuclear ophthalmoplegia [Steele-Richardson-Olszewski]: Secondary | ICD-10-CM | POA: Diagnosis not present

## 2017-02-27 DIAGNOSIS — Z8546 Personal history of malignant neoplasm of prostate: Secondary | ICD-10-CM | POA: Diagnosis not present

## 2017-02-27 DIAGNOSIS — Z9181 History of falling: Secondary | ICD-10-CM | POA: Diagnosis not present

## 2017-02-27 DIAGNOSIS — Z8781 Personal history of (healed) traumatic fracture: Secondary | ICD-10-CM | POA: Diagnosis not present

## 2017-02-27 DIAGNOSIS — Z993 Dependence on wheelchair: Secondary | ICD-10-CM | POA: Diagnosis not present

## 2017-02-28 ENCOUNTER — Ambulatory Visit: Payer: Medicare Other | Admitting: Neurology

## 2017-02-28 DIAGNOSIS — Z993 Dependence on wheelchair: Secondary | ICD-10-CM | POA: Diagnosis not present

## 2017-02-28 DIAGNOSIS — Z8781 Personal history of (healed) traumatic fracture: Secondary | ICD-10-CM | POA: Diagnosis not present

## 2017-02-28 DIAGNOSIS — Z8546 Personal history of malignant neoplasm of prostate: Secondary | ICD-10-CM | POA: Diagnosis not present

## 2017-02-28 DIAGNOSIS — Z9181 History of falling: Secondary | ICD-10-CM | POA: Diagnosis not present

## 2017-02-28 DIAGNOSIS — G231 Progressive supranuclear ophthalmoplegia [Steele-Richardson-Olszewski]: Secondary | ICD-10-CM | POA: Diagnosis not present

## 2017-02-28 DIAGNOSIS — Z8551 Personal history of malignant neoplasm of bladder: Secondary | ICD-10-CM | POA: Diagnosis not present

## 2017-03-02 DIAGNOSIS — Z8546 Personal history of malignant neoplasm of prostate: Secondary | ICD-10-CM | POA: Diagnosis not present

## 2017-03-02 DIAGNOSIS — Z8551 Personal history of malignant neoplasm of bladder: Secondary | ICD-10-CM | POA: Diagnosis not present

## 2017-03-02 DIAGNOSIS — G231 Progressive supranuclear ophthalmoplegia [Steele-Richardson-Olszewski]: Secondary | ICD-10-CM | POA: Diagnosis not present

## 2017-03-02 DIAGNOSIS — Z9181 History of falling: Secondary | ICD-10-CM | POA: Diagnosis not present

## 2017-03-02 DIAGNOSIS — Z993 Dependence on wheelchair: Secondary | ICD-10-CM | POA: Diagnosis not present

## 2017-03-02 DIAGNOSIS — Z8781 Personal history of (healed) traumatic fracture: Secondary | ICD-10-CM | POA: Diagnosis not present

## 2017-03-03 DIAGNOSIS — Z8546 Personal history of malignant neoplasm of prostate: Secondary | ICD-10-CM | POA: Diagnosis not present

## 2017-03-03 DIAGNOSIS — Z8551 Personal history of malignant neoplasm of bladder: Secondary | ICD-10-CM | POA: Diagnosis not present

## 2017-03-03 DIAGNOSIS — Z993 Dependence on wheelchair: Secondary | ICD-10-CM | POA: Diagnosis not present

## 2017-03-03 DIAGNOSIS — Z8781 Personal history of (healed) traumatic fracture: Secondary | ICD-10-CM | POA: Diagnosis not present

## 2017-03-03 DIAGNOSIS — G231 Progressive supranuclear ophthalmoplegia [Steele-Richardson-Olszewski]: Secondary | ICD-10-CM | POA: Diagnosis not present

## 2017-03-03 DIAGNOSIS — Z9181 History of falling: Secondary | ICD-10-CM | POA: Diagnosis not present

## 2017-03-06 DIAGNOSIS — G231 Progressive supranuclear ophthalmoplegia [Steele-Richardson-Olszewski]: Secondary | ICD-10-CM | POA: Diagnosis not present

## 2017-03-06 DIAGNOSIS — Z8781 Personal history of (healed) traumatic fracture: Secondary | ICD-10-CM | POA: Diagnosis not present

## 2017-03-06 DIAGNOSIS — Z9181 History of falling: Secondary | ICD-10-CM | POA: Diagnosis not present

## 2017-03-06 DIAGNOSIS — Z8546 Personal history of malignant neoplasm of prostate: Secondary | ICD-10-CM | POA: Diagnosis not present

## 2017-03-06 DIAGNOSIS — Z8551 Personal history of malignant neoplasm of bladder: Secondary | ICD-10-CM | POA: Diagnosis not present

## 2017-03-06 DIAGNOSIS — Z993 Dependence on wheelchair: Secondary | ICD-10-CM | POA: Diagnosis not present

## 2017-03-07 DIAGNOSIS — Z8546 Personal history of malignant neoplasm of prostate: Secondary | ICD-10-CM | POA: Diagnosis not present

## 2017-03-07 DIAGNOSIS — Z8551 Personal history of malignant neoplasm of bladder: Secondary | ICD-10-CM | POA: Diagnosis not present

## 2017-03-07 DIAGNOSIS — Z8781 Personal history of (healed) traumatic fracture: Secondary | ICD-10-CM | POA: Diagnosis not present

## 2017-03-07 DIAGNOSIS — Z9181 History of falling: Secondary | ICD-10-CM | POA: Diagnosis not present

## 2017-03-07 DIAGNOSIS — Z993 Dependence on wheelchair: Secondary | ICD-10-CM | POA: Diagnosis not present

## 2017-03-07 DIAGNOSIS — G231 Progressive supranuclear ophthalmoplegia [Steele-Richardson-Olszewski]: Secondary | ICD-10-CM | POA: Diagnosis not present

## 2017-03-07 NOTE — Progress Notes (Signed)
EKG 06-30-16 epic  ECHO 07-17-15 epic  "Aortic valve:   Moderately thickened, mildly calcified leaflets. Doppler:   There was no stenosis.   There was no regurgitation."  CXR 06-30-16 epic

## 2017-03-07 NOTE — Patient Instructions (Signed)
Brian Montgomery  03/07/2017   Your procedure is scheduled on: 03-16-17  Report to [facility] Main  Entrance Take [facility]  elevators to 3rd floor to  [place] at 9:30AM.    Call this number if you have problems the morning of surgery [phone]    Remember: ONLY 1 PERSON MAY GO WITH YOU TO SHORT STAY TO GET  READY MORNING OF [place].  Do not eat food or drink liquids :After Midnight.     Take these medicines the morning of surgery with A SIP OF WATER: eye drops, sinemet, citalopram(celexa), simvastatin(zocor)                                 You may not have any metal on your body including hair pins and              piercings  Do not wear jewelry, make-up, lotions, powders or perfumes, deodorant                     Men may shave face and neck.   Do not bring valuables to the hospital. [place].  Contacts, dentures or bridgework may not be worn into surgery.  Leave suitcase in the car. After surgery it may be brought to your room.                Please read over the following fact sheets you were given: _____________________________________________________________________            [facility] - Preparing for Surgery Before surgery, you can play an important role.  Because skin is not sterile, your skin needs to be as free of germs as possible.  You can reduce the number of germs on your skin by washing with CHG (chlorahexidine gluconate) soap before surgery.  CHG is an antiseptic cleaner which kills germs and bonds with the skin to continue killing germs even after washing. Please DO NOT use if you have an allergy to CHG or antibacterial soaps.  If your skin becomes reddened/irritated stop using the CHG and inform your nurse when you arrive at Short Stay. Do not shave (including legs and underarms) for at least 48 hours prior to the first CHG shower.  You may shave your face/neck. Please follow  these instructions carefully:  1.  Shower with CHG Soap the night before surgery and the  morning of Surgery.  2.  If you choose to wash your hair, wash your hair first as usual with your  normal  shampoo.  3.  After you shampoo, rinse your hair and body thoroughly to remove the  shampoo.                           4.  Use CHG as you would any other liquid soap.  You can apply chg directly  to the skin and wash                       Gently with a scrungie or clean washcloth.  5.  Apply the CHG Soap to your body ONLY FROM THE NECK DOWN.   Do not use on face/ open  Wound or open sores. Avoid contact with eyes, ears mouth and genitals (private parts).                       Wash face,  Genitals (private parts) with your normal soap.             6.  Wash thoroughly, paying special attention to the area where your surgery  will be performed.  7.  Thoroughly rinse your body with warm water from the neck down.  8.  DO NOT shower/wash with your normal soap after using and rinsing off  the CHG Soap.                9.  Pat yourself dry with a clean towel.            10.  Wear clean pajamas.            11.  Place clean sheets on your bed the night of your first shower and do not  sleep with pets. Day of Surgery : Do not apply any lotions/deodorants the morning of surgery.  Please wear clean clothes to the hospital/surgery center.  FAILURE TO FOLLOW THESE INSTRUCTIONS MAY RESULT IN THE CANCELLATION OF YOUR SURGERY PATIENT SIGNATURE_________________________________  NURSE SIGNATURE__________________________________  ________________________________________________________________________

## 2017-03-08 ENCOUNTER — Encounter (INDEPENDENT_AMBULATORY_CARE_PROVIDER_SITE_OTHER): Payer: Self-pay

## 2017-03-08 ENCOUNTER — Encounter (HOSPITAL_COMMUNITY): Payer: Self-pay

## 2017-03-08 ENCOUNTER — Encounter (HOSPITAL_COMMUNITY)
Admission: RE | Admit: 2017-03-08 | Discharge: 2017-03-08 | Disposition: A | Discharge status: Home / Self Care | Payer: Medicare Other | Source: Ambulatory Visit | Attending: Urology | Admitting: Urology

## 2017-03-08 DIAGNOSIS — Z01812 Encounter for preprocedural laboratory examination: Secondary | ICD-10-CM | POA: Insufficient documentation

## 2017-03-08 HISTORY — DX: Unspecified dementia, unspecified severity, without behavioral disturbance, psychotic disturbance, mood disturbance, and anxiety: F03.90

## 2017-03-08 HISTORY — DX: Pneumonia, unspecified organism: J18.9

## 2017-03-08 LAB — CBC
HCT: 31.1 % — ABNORMAL LOW (ref 39.0–52.0)
HEMOGLOBIN: 10.2 g/dL — AB (ref 13.0–17.0)
MCH: 28.2 pg (ref 26.0–34.0)
MCHC: 32.8 g/dL (ref 30.0–36.0)
MCV: 85.9 fL (ref 78.0–100.0)
Platelets: 227 10*3/uL (ref 150–400)
RBC: 3.62 MIL/uL — ABNORMAL LOW (ref 4.22–5.81)
RDW: 14 % (ref 11.5–15.5)
WBC: 5.5 10*3/uL (ref 4.0–10.5)

## 2017-03-08 LAB — BASIC METABOLIC PANEL
ANION GAP: 6 (ref 5–15)
BUN: 23 mg/dL — AB (ref 6–20)
CHLORIDE: 107 mmol/L (ref 101–111)
CO2: 25 mmol/L (ref 22–32)
Calcium: 9.6 mg/dL (ref 8.9–10.3)
Creatinine, Ser: 1.51 mg/dL — ABNORMAL HIGH (ref 0.61–1.24)
GFR calc Af Amer: 49 mL/min — ABNORMAL LOW (ref >60)
GFR calc non Af Amer: 42 mL/min — ABNORMAL LOW (ref >60)
Glucose, Bld: 95 mg/dL (ref 65–99)
POTASSIUM: 4 mmol/L (ref 3.5–5.1)
SODIUM: 138 mmol/L (ref 135–145)

## 2017-03-08 NOTE — Progress Notes (Signed)
Spoke with Brian [facility] unable to get urine specimen with pre op, Brian will make Brian Montgomery aware and call patient to make appointment at [facility] for in and out cath for ua and culture.

## 2017-03-10 ENCOUNTER — Other Ambulatory Visit: Payer: Self-pay | Admitting: Family Medicine

## 2017-03-10 DIAGNOSIS — N3000 Acute cystitis without hematuria: Secondary | ICD-10-CM | POA: Diagnosis not present

## 2017-03-10 NOTE — Telephone Encounter (Signed)
Is this okay to refill? Pt hasn't had lipids checked since 2016

## 2017-03-14 ENCOUNTER — Encounter: Payer: Self-pay | Admitting: Neurology

## 2017-03-14 ENCOUNTER — Ambulatory Visit (INDEPENDENT_AMBULATORY_CARE_PROVIDER_SITE_OTHER): Payer: Medicare Other | Admitting: Neurology

## 2017-03-14 VITALS — BP 102/54 | HR 64 | Ht 64.0 in | Wt 145.9 lb

## 2017-03-14 DIAGNOSIS — G231 Progressive supranuclear ophthalmoplegia [Steele-Richardson-Olszewski]: Secondary | ICD-10-CM | POA: Diagnosis not present

## 2017-03-14 MED ORDER — CARBIDOPA-LEVODOPA 25-100 MG PO TABS: 1.5000 | ORAL_TABLET | Freq: Three times a day (TID) | ORAL | 2 refills | Status: DC

## 2017-03-14 NOTE — Patient Instructions (Signed)
1.  We will increase carbidopa (Sinemet) 25/100mg  tablets to:  1 and 1/2 tablets three times daily. Take the medication at the same time everyday. The medication does not get absorbed into your body as well, if you take it with protein-containing foods (meat, dairy, beans). Try taking the medication about one hour before meals. If you experience nausea by taking it on an empty stomach, you may take it with carbohydrate-containing food,such as bread.  2.  Continue donepezil 10mg  at bedtime 3.  We will reorder home physical therapy and occupational therapy 4.  Follow up in 6 months.

## 2017-03-14 NOTE — Progress Notes (Signed)
Spoke with Brian Montgomery urine culture and urinalysis obtained Friday 831-18 at [facility] urology, orders for urinalysis and urine culture discontinued from epic from dr borden, due to urinalysis and urine culture obtained at [facility] urology.

## 2017-03-14 NOTE — Progress Notes (Signed)
NEUROLOGY FOLLOW UP OFFICE NOTE  Brian Montgomery [id number]  HISTORY OF PRESENT ILLNESS: Brian Montgomery is a 81 year old right-handed man with hypertension, dyslipidemia, and arthritis who follows up for suspected PSP.  He is accompanied by his wife and daughter, who supplement history.  Marland Kitchen   UPDATE: Medications: carbidopa-levodopa (25/100mg  three times daily).   Initially, family noticed benefit. Donepezil 10mg  at bedtime Citalopam 10mg  daily   He is living at home with his wife. His wife requires assistance getting him in and out of bed.  Somebody comes to the house to assist him in bathing twice a week.  A neighbor helps his wife getting him into bed at night.  He is now non-ambulatory.  He uses the walker to maintain balance when he stands, such as putting on clothes.  Mood is okay with citalopram.  He has not had any recent hallucinations.   HISTORY: Since 2015, he has had increased problems with balance.  When he gets up, he has to take a moment to maintain his balance.  He sometimes feels has to push off.  When he is walking, he feels like his body is being pulled to either side.  There is no spinning sensation, lightheadedness, nausea, vomiting, slurred speech, visual disturbance or numbness or weakness.  He denies neck pain, low back pain or pain in the legs when he walks or stands.  He has longstanding problems with hearing but no tinnitus.  Orthostatics were checked, which were negative.     He does have some urinary frequency but has BPH.  He does have symptoms suggestive of REM sleep behavior disorder (he will scream and thrash around in his sleep, one time falling out of bed).  He denies tremor.    Over time, he has had increased memory deficits, hallucinations, sundowning, sometimes grabs at the air and requires assistance with all basic ADLs, including bathing, dressing and using the toilet.  After a fall on his left side, he developed left arm pain and hand weakness.  NCV-EMG was  performed on 02/25/16, which demonstrated left median neuropathy at the wrist, consistent with carpal tunnel syndrome.  Left ulnar response showed prolonged latency in isolation, of unclear significance.  No polyneuropathy was appreciated.  He was referred to OT for left hand.     MRI of the brain from 10/27/14 showed mild to moderate chronic small vessel disease and mild global atrophy.    MRI of cervical spine from 09/01/15 showed multilevel spondylosis and mild spinal stenosis without cord deformity or signal abnormality.  He was referred again to PT.  He continues to fall and had recent fall on 11/14/15, which required an ED visit.  He fell on his left side/shoulder.  He had dislocated his shoulder but was able to pop it back in.  X-ray of left shoulder in the ED revealed osteoarthritic changes but no fracture or dislocation.  Since the fall, he has had numbness and weakness of his left hand.  He notes pain in the shoulder but denies neck pain. Labs, including TSH, glucose, ANA, Sed Rate, B12, RPR, SPEP/IFE were normal.   His wife is his POA.  PAST MEDICAL HISTORY: Past Medical History:  Diagnosis Date  . Arthritis   . Bladder cancer ([place])    infiltrative high grade papillary urothelial carcinoma   . Dementia    mild   . Dyslipidemia   . ED (erectile dysfunction)   . Hypertension   . Palsy ([place])    super  nuclear palsy managed by Brian Montgomery  . Parkinson disease ([place])   . Pneumonia    x1 in adulthood     MEDICATIONS: Current Outpatient Prescriptions on File Prior to Visit  Medication Sig Dispense Refill  . Artificial Tear Ointment (DRY EYES OP) Apply 1 drop to eye daily as needed (dry eyes).    . benzonatate (TESSALON) 100 MG capsule Take 1 capsule (100 mg total) by mouth 3 (three) times daily as needed for cough. (Patient taking differently: Take 100 mg by mouth 2 (two) times daily as needed for cough. ) 20 capsule 0  . citalopram (CELEXA) 10 MG tablet TAKE 1 TABLET(10 MG) BY MOUTH DAILY 30  tablet 2  . donepezil (ARICEPT) 10 MG tablet Take 1 tablet (10 mg total) by mouth at bedtime. 30 tablet 5  . HYDROcodone-acetaminophen (NORCO/VICODIN) 5-325 MG tablet Take 1-2 tablets by mouth every 6 (six) hours as needed for moderate pain. (Patient taking differently: Take 1 tablet by mouth daily as needed for moderate pain. ) 30 tablet 0  . lisinopril-hydrochlorothiazide (PRINZIDE,ZESTORETIC) 10-12.5 MG tablet TAKE 1 TABLET BY MOUTH DAILY 90 tablet 1  . loperamide (IMODIUM) 2 MG capsule Take 1 capsule (2 mg total) by mouth as needed for diarrhea or loose stools. (Patient taking differently: Take 2 mg by mouth 3 (three) times daily as needed for diarrhea or loose stools. ) 30 capsule 0  . Misc. Devices (FOAM CUSHION) MISC Wheelchair cushion 1 each 0  . phenazopyridine (PYRIDIUM) 100 MG tablet Take 1 tablet (100 mg total) by mouth 3 (three) times daily as needed for pain (for burning). (Patient not taking: Reported on 03/03/2017) 20 tablet 0  . pseudoephedrine-guaifenesin (MUCINEX D) 60-600 MG 12 hr tablet Take 1 tablet by mouth at bedtime as needed for congestion.     . simvastatin (ZOCOR) 20 MG tablet TAKE 1 TABLET BY MOUTH DAILY 90 tablet 0   No current facility-administered medications on file prior to visit.     ALLERGIES: No Known Allergies  FAMILY HISTORY: Family History  Problem Relation Age of Onset  . Heart failure Mother   . Hypertension Mother   . Heart failure Father   . Hypertension Father   . Renal Disease Sister   . Diabetes Brother     SOCIAL HISTORY: Social History   Social History  . Marital status: Married    Spouse name: N/A  . Number of children: N/A  . Years of education: N/A   Occupational History  . Not on file.   Social History Main Topics  . Smoking status: Former Smoker    Packs/day: 0.25    Years: 2.00    Types: Cigarettes    Quit date: 07/12/1967  . Smokeless tobacco: Never Used  . Alcohol use No  . Drug use: No  . Sexual activity: Yes     Partners: Female   Other Topics Concern  . Not on file   Social History Narrative  . No narrative on file    REVIEW OF SYSTEMS: Constitutional: No fevers, chills, or sweats, no generalized fatigue, change in appetite Eyes: No visual changes, double vision, eye pain Ear, nose and throat: No hearing loss, ear pain, nasal congestion, sore throat Cardiovascular: No chest pain, palpitations Respiratory:  No shortness of breath at rest or with exertion, wheezes GastrointestinaI: No nausea, vomiting, diarrhea, abdominal pain, fecal incontinence Genitourinary:  No dysuria, urinary retention or frequency Musculoskeletal:  No neck pain, back pain Integumentary: No rash, pruritus, skin lesions Neurological: as  above Psychiatric: No depression, insomnia, anxiety Endocrine: No palpitations, fatigue, diaphoresis, mood swings, change in appetite, change in weight, increased thirst Hematologic/Lymphatic:  No purpura, petechiae. Allergic/Immunologic: no itchy/runny eyes, nasal congestion, recent allergic reactions, rashes  PHYSICAL EXAM: Vitals:   03/14/17 0939  BP: (!) 102/54  Pulse: 64  SpO2: 98%   General: No acute distress.  Patient appears well-groomed.   Head:  Normocephalic/atraumatic Eyes:  Fundi examined but not visualized Neck: supple, no paraspinal tenderness, full range of motion Heart:  Regular rate and rhythm Lungs:  Clear to auscultation bilaterally Back: No paraspinal tenderness Neurological Exam: alert and oriented to person and place but not time. Attention span and concentration impaired, recent memory poor, remote memory intact, fund of knowledge impaired.  Speech fluent and not dysarthric, language intact.    Montreal Cognitive Assessment  03/14/2017 07/27/2015  Visuospatial/ Executive (0/5) 0 1  Naming (0/3) 2 2  Attention: Read list of digits (0/2) 1 1  Attention: Read list of letters (0/1) 1 1  Attention: Serial 7 subtraction starting at 100 (0/3) 0 0  Language:  Repeat phrase (0/2) 2 0  Language : Fluency (0/1) 0 0  Abstraction (0/2) 2 0  Delayed Recall (0/5) 0 0  Orientation (0/6) 2 4  Total 10 9  Adjusted Score (based on education) 11 10   Decreased upward and downward gaze on tracking.  Bilateral ptosis.  Otherwise, CN II-XII intact. Bulk and tone normal, 3+ left deltoid and 5- left hand grip.  3+ in lower extremities.  Otherwise, muscle strength 5/5 throughout.  Bradykinesia.  Decreased finger-thumb tapping speed and amplitude.  No cogwheel rigidity or tremor.  Deep tendon reflexes 2+ throughout.  Finger to nose testing slowed.  Needs assistance standing up.  Cannot walk.  IMPRESSION: Progressive supranuclear palsy  PLAN: 1.  We will increase carbidopa-levodopa to 1.5 tablets three times daily.   2.  Continue donepezil 10mg  at bedtime and citalopram. 3.  We will order another round of PT/OT 4.  Advised to limit use of hydrocodone 5.  Continue home help for Mr. Jeppsen and his wife. 6.  Follow up in 6 months.  26 minutes spent face to face with patient, over 50% spent discussing diagnosis and management.  Metta Clines, DO  CC:  Jill Alexanders, MD

## 2017-03-15 NOTE — H&P (Signed)
Office Visit Report     02/15/2017   --------------------------------------------------------------------------------   Brian Montgomery  MRN: [id number]  PRIMARY CARE:  Denita Lung, MD  DOB: 03-29-1936, 81 year old Male  REFERRING:  Luvenia Redden  SSN: -[id number]  PROVIDER:  Raynelle Bring, M.D.    LOCATION:  [facility] [phone]   --------------------------------------------------------------------------------   CC/HPI: Urothelial carcinoma of the bladder and prostate cancer   Mr. Brian Montgomery returns today for surveillance cystoscopy. The previously noted abnormality in his bladder resulted in transurethral resection in April. This confirmed polypoid cystitis but no malignancy. His general health has remained fairly stable since his last visit. He currently has not had any hematuria or other complaints with regard to his urinary tract. However, his family has noted somewhat foul-smelling urine over the past couple of months. He has not any fever or dysuria.     ALLERGIES: No Allergies    MEDICATIONS: Benzonatate 100 mg capsule  Carbidopa-Levodopa 25 mg-100 mg tablet  Citalopram Hbr 10 mg tablet  Donepezil Hcl 5 mg tablet Oral  Hydrocodone-Acetaminophen 5 mg-325 mg tablet  Lisinopril-Hydrochlorothiazide 10 mg-12.5 mg tablet  Prochlorperazine Maleate 10 mg tablet  Simvastatin 20 mg tablet Oral     GU PSH: Catheterize For Residual - 01/18/2016 Cysto Dilate Stricture (M or F) - 01/19/2016 Cystoscopy - 08/17/2016, 01/19/2016 Cystoscopy Irrigate Clot - 01/21/2016 Cystoscopy TURBT <2 cm - 03/22/2016 Cystoscopy TURBT >5 cm - 01/21/2016 Cystoscopy TURBT 2-5 cm - 10/20/2016 Initial Male VB Sounds - 01/19/2016 Locm 300-399Mg /Ml Iodine,1Ml - 02/09/2017, 01/29/2016 PLACE RT DEVICE/MARKER, PROS - 04/13/2016 Prostate Needle Biopsy - 03/22/2016      PSH Notes: Back Surgery   NON-GU PSH: Hip Arthroscopy/surgery, Left    GU PMH: Prostate Cancer - 04/06/2016 Gross hematuria,  Gross hematuria - 12/09/2015 Bladder Cancer overlapping sites      PMH Notes:   1) Prostate cancer: He presented with a prostate nodule and a PSA of 9.3. He underwent a TRUS biopsy demonstrating 5 out of 12 biopsy cores positive for malignancy. He was already proceeding with radiation for treatment of his bladder cancer and therefore elected to simultaneously treat his prostate cancer with EBRT as well.   Sep 2017: 12 core biopsy - 5/12 cores positive for Gleason 3+3=6 adenocarcinoma  Oct-Nov 2017: 71 Gy radiation with 45 Gy to pelvic lymph nodes (for bladder cancer)   2) Bladder cancer: He developed gross hematuria in May 2017. This was initially thought to be related to infection but persisted and ultimately he required catheterization. He underwent cystoscopy in the OR in July 2017 due to an inability to well visualize the bladder. He was ultimately found to have a large 6 cm bladder tumor and underwent TURBT. He was noted to have muscle invasive bladder cancer. He declined radical cystectomy and elected to proceed with radiation/chemotherapy trimodality therapy.   Jul 2017: Cysto (no definite tumors seen), large formed clot in bladder  Jul 2017: TURBT - High grade muscle invasive urothelial carcinoma  Sep 2017: TUR of residual tumor prior to radiation (residual low grade, Ta disease)  Oct-Nov 2017: 20 Gy to bladder, 45 Gy to pelvic lymph nodes (carboplatin chemotherapy)  Apr 2018: TUR - polypoid cystitis without evidence of malignancy     NON-GU PMH: Personal history of other diseases of the musculoskeletal system and connective tissue, History of arthritis - 12/09/2015 Personal history of other diseases of the nervous system and sense organs, History of polyneuropathy - 12/09/2015 Hypercholesterolemia Hypertension  FAMILY HISTORY: Death of parent - Runs In Family malignant neoplasm of kidney - Runs In Family   SOCIAL HISTORY: Marital Status: Married Preferred Language: English;  Ethnicity: ; Race: Black or African American     Notes: Married, Number of children, Retired, Former smoker, Alcohol use   REVIEW OF SYSTEMS:    GU Review Male:   Patient denies frequent urination, hard to postpone urination, burning/ pain with urination, get up at night to urinate, leakage of urine, stream starts and stops, trouble starting your streams, and have to strain to urinate .  Gastrointestinal (Lower):   Patient denies diarrhea and constipation.  Gastrointestinal (Upper):   Patient denies nausea and vomiting.  Constitutional:   Patient denies fever, night sweats, weight loss, and fatigue.  Skin:   Patient denies skin rash/ lesion and itching.  Eyes:   Patient denies blurred vision and double vision.  Ears/ Nose/ Throat:   Patient denies sore throat and sinus problems.  Hematologic/Lymphatic:   Patient denies swollen glands and easy bruising.  Cardiovascular:   Patient denies leg swelling and chest pains.  Respiratory:   Patient denies cough and shortness of breath.  Endocrine:   Patient denies excessive thirst.  Musculoskeletal:   Patient denies back pain and joint pain.  Neurological:   Patient denies headaches and dizziness.  Psychologic:   Patient denies depression and anxiety.   VITAL SIGNS:      02/15/2017 03:49 PM  Weight 170 lb / 77.11 kg  Height 65 in / 165.1 cm  BP 92/62 mmHg  Pulse 76 /min  BMI 28.3 kg/m   MULTI-SYSTEM PHYSICAL EXAMINATION:    Constitutional: Well-nourished. No physical deformities. Normally developed. Good grooming.  Gastrointestinal: Soft, nontender, no CVA tenderness.     PAST DATA REVIEWED:  Source Of History:  Patient  Urine Test Review:   Urinalysis  X-Ray Review: C.T. Abdomen/Pelvis: Reviewed Films.     08/17/16 03/17/16  PSA  Total PSA 5.93 ng/dl 9.36 ng/dl   Notes:                     His urine will be cultured.   I reviewed his CT scan. This demonstrated right hydroureteronephrosis down to the level of the bladder. Near the  right UVJ, he does have a mass within the wall of the bladder that likely is the cause of his obstruction. This is in a similar vicinity to his prior lesion noted on cystoscopy that was consistent with polypoid cystitis.     PROCEDURES:          Catheter / SP Tube - [id number] In and Out Cath for Specimen/ Medicare  A 16 French red rubber or straight catheter was inserted into the bladder using sterile technique. A urinalysis was sent to the lab. A urine culture was sent to the lab.         Urinalysis w/Scope Dipstick Dipstick Cont'd Micro  Color: Yellow Bilirubin: Neg WBC/hpf: >60/hpf  Appearance: Cloudy Ketones: Neg RBC/hpf: NS (Not Seen)  Specific Gravity: 1.025 Blood: Trace Bacteria: Mod (26-50/hpf)  pH: 6.0 Protein: 3+ Cystals: NS (Not Seen)  Glucose: Neg Urobilinogen: 0.2 Casts: NS (Not Seen)    Nitrites: Neg Trichomonas: Not Present    Leukocyte Esterase: 3+ Mucous: Present      Epithelial Cells: NS (Not Seen)      Yeast: NS (Not Seen)      Sperm: Not Present    ASSESSMENT:      ICD-10 Details  1 GU:   Acute Cystitis/UTI - N30.00   2   Bladder Cancer overlapping sites - C67.8   3   Prostate Cancer - C61   4   Hydronephrosis - N13.0    PLAN:           Orders Labs Urine Culture          Schedule Return Visit/Planned Activity: Other See Visit Notes             Note: will call with culture results and to discuss surgery          Document Letter(s):  Created for Patient: Clinical Summary         Notes:   1. Muscle invasive bladder cancer and right hydronephrosis: I discussed the findings from the CT scan with his family today. He does appear to have some component of right ureteral obstruction at the level of the bladder. This could be related to his polypoid cystitis related radiation therapy although this also could be recurrent/progression of his bladder cancer. He did not undergo cystoscopy today based on his urinalysis. I did discuss options with the family.  Considering that he did fairly well with general anesthesia at this last procedure, we discussed proceeding with cystoscopy, transurethral resection of the mass near his right UVJ, and right ureteral stent placement to relieve his right ureteral obstruction. We reviewed this procedure in detail including the potential risks and complications as well as the expected recovery process. At his wife's request, we would plan to keep him overnight. The family would like to discuss this plan with his other daughter prior to making any final decision. They will get in touch with me later this week.   2. Urinary tract infection: His urine has been cultured. He will be treated with appropriate culture specific antibiotics.   3. Prostate cancer: His PSA will be due to be checked at his next visit.   Cc: Dr. Jill Alexanders  Dr. Zola Button  Dr. Tyler Pita    E & M CODE: I spent at least 28 minutes face to face with the patient, more than 50% of that time was spent on counseling and/or coordinating care.     * Signed by Raynelle Bring, M.D. on 02/15/17 at 9:53 PM (EDT)*

## 2017-03-16 ENCOUNTER — Observation Stay (HOSPITAL_COMMUNITY)
Admission: RE | Admit: 2017-03-16 | Discharge: 2017-03-17 | Disposition: A | Discharge status: Home / Self Care | Payer: Medicare Other | Source: Ambulatory Visit | Attending: Urology | Admitting: Urology

## 2017-03-16 ENCOUNTER — Encounter (HOSPITAL_COMMUNITY): Payer: Self-pay | Admitting: Anesthesiology

## 2017-03-16 ENCOUNTER — Ambulatory Visit (HOSPITAL_COMMUNITY): Payer: Medicare Other

## 2017-03-16 ENCOUNTER — Encounter (HOSPITAL_COMMUNITY)
Admission: RE | Disposition: A | Discharge status: Home / Self Care | Payer: Self-pay | Source: Ambulatory Visit | Attending: Urology

## 2017-03-16 ENCOUNTER — Ambulatory Visit (HOSPITAL_COMMUNITY): Payer: Medicare Other | Admitting: Anesthesiology

## 2017-03-16 DIAGNOSIS — Z9221 Personal history of antineoplastic chemotherapy: Secondary | ICD-10-CM | POA: Diagnosis not present

## 2017-03-16 DIAGNOSIS — N3030 Trigonitis without hematuria: Secondary | ICD-10-CM | POA: Diagnosis not present

## 2017-03-16 DIAGNOSIS — Z79899 Other long term (current) drug therapy: Secondary | ICD-10-CM | POA: Diagnosis not present

## 2017-03-16 DIAGNOSIS — Z923 Personal history of irradiation: Secondary | ICD-10-CM | POA: Insufficient documentation

## 2017-03-16 DIAGNOSIS — I129 Hypertensive chronic kidney disease with stage 1 through stage 4 chronic kidney disease, or unspecified chronic kidney disease: Secondary | ICD-10-CM | POA: Diagnosis not present

## 2017-03-16 DIAGNOSIS — D494 Neoplasm of unspecified behavior of bladder: Secondary | ICD-10-CM | POA: Diagnosis not present

## 2017-03-16 DIAGNOSIS — Z87891 Personal history of nicotine dependence: Secondary | ICD-10-CM | POA: Insufficient documentation

## 2017-03-16 DIAGNOSIS — G20 Parkinson's disease: Secondary | ICD-10-CM | POA: Diagnosis not present

## 2017-03-16 DIAGNOSIS — N1330 Unspecified hydronephrosis: Secondary | ICD-10-CM | POA: Insufficient documentation

## 2017-03-16 DIAGNOSIS — I10 Essential (primary) hypertension: Secondary | ICD-10-CM | POA: Diagnosis not present

## 2017-03-16 DIAGNOSIS — C61 Malignant neoplasm of prostate: Secondary | ICD-10-CM | POA: Insufficient documentation

## 2017-03-16 DIAGNOSIS — Z8051 Family history of malignant neoplasm of kidney: Secondary | ICD-10-CM | POA: Insufficient documentation

## 2017-03-16 DIAGNOSIS — N183 Chronic kidney disease, stage 3 (moderate): Secondary | ICD-10-CM | POA: Diagnosis not present

## 2017-03-16 DIAGNOSIS — C679 Malignant neoplasm of bladder, unspecified: Secondary | ICD-10-CM | POA: Diagnosis not present

## 2017-03-16 HISTORY — PX: TRANSURETHRAL RESECTION OF BLADDER TUMOR: SHX2575

## 2017-03-16 HISTORY — PX: CYSTOSCOPY WITH STENT PLACEMENT: SHX5790

## 2017-03-16 SURGERY — TURBT (TRANSURETHRAL RESECTION OF BLADDER TUMOR)
Anesthesia: General | Laterality: Right

## 2017-03-16 MED ORDER — PROPOFOL 10 MG/ML IV BOLUS
INTRAVENOUS | Status: AC
  Filled 2017-03-16: qty 20

## 2017-03-16 MED ORDER — INDIGOTINDISULFONATE SODIUM 8 MG/ML IJ SOLN
INTRAMUSCULAR | Status: AC
  Filled 2017-03-16: qty 5

## 2017-03-16 MED ORDER — DEXAMETHASONE SODIUM PHOSPHATE 10 MG/ML IJ SOLN
INTRAMUSCULAR | Status: AC
  Filled 2017-03-16: qty 1

## 2017-03-16 MED ORDER — ONDANSETRON HCL 4 MG/2ML IJ SOLN: 4.0000 mg | Freq: Once | INTRAMUSCULAR | Status: DC | PRN

## 2017-03-16 MED ORDER — SODIUM CHLORIDE 0.9 % IV SOLN: 250.0000 mL | INTRAVENOUS | Status: DC | PRN

## 2017-03-16 MED ORDER — PHENAZOPYRIDINE HCL 100 MG PO TABS
100.0000 mg | ORAL_TABLET | Freq: Three times a day (TID) | ORAL | Status: DC | PRN
  Filled 2017-03-16: qty 1

## 2017-03-16 MED ORDER — SODIUM CHLORIDE 0.9% FLUSH: 3.0000 mL | INTRAVENOUS | Status: DC | PRN

## 2017-03-16 MED ORDER — SODIUM CHLORIDE 0.9% FLUSH
3.0000 mL | Freq: Two times a day (BID) | INTRAVENOUS | Status: DC
  Administered 2017-03-16 (×2): 3 mL via INTRAVENOUS

## 2017-03-16 MED ORDER — DEXAMETHASONE SODIUM PHOSPHATE 10 MG/ML IJ SOLN
INTRAMUSCULAR | Status: DC | PRN
  Administered 2017-03-16: 10 mg via INTRAVENOUS

## 2017-03-16 MED ORDER — SUGAMMADEX SODIUM 200 MG/2ML IV SOLN
INTRAVENOUS | Status: DC | PRN
  Administered 2017-03-16: 150 mg via INTRAVENOUS

## 2017-03-16 MED ORDER — PROPOFOL 10 MG/ML IV BOLUS
INTRAVENOUS | Status: DC | PRN
  Administered 2017-03-16: 40 mg via INTRAVENOUS
  Administered 2017-03-16: 100 mg via INTRAVENOUS

## 2017-03-16 MED ORDER — ROCURONIUM BROMIDE 10 MG/ML (PF) SYRINGE
PREFILLED_SYRINGE | INTRAVENOUS | Status: DC | PRN
  Administered 2017-03-16: 30 mg via INTRAVENOUS

## 2017-03-16 MED ORDER — ONDANSETRON HCL 4 MG/2ML IJ SOLN
INTRAMUSCULAR | Status: DC | PRN
  Administered 2017-03-16: 4 mg via INTRAVENOUS

## 2017-03-16 MED ORDER — PHENAZOPYRIDINE HCL 100 MG PO TABS: 100.0000 mg | ORAL_TABLET | Freq: Three times a day (TID) | ORAL | 0 refills | Status: DC | PRN

## 2017-03-16 MED ORDER — SUCCINYLCHOLINE CHLORIDE 200 MG/10ML IV SOSY
PREFILLED_SYRINGE | INTRAVENOUS | Status: AC
  Filled 2017-03-16: qty 10

## 2017-03-16 MED ORDER — LIDOCAINE 2% (20 MG/ML) 5 ML SYRINGE
INTRAMUSCULAR | Status: AC
  Filled 2017-03-16: qty 5

## 2017-03-16 MED ORDER — FENTANYL CITRATE (PF) 100 MCG/2ML IJ SOLN
INTRAMUSCULAR | Status: DC | PRN
  Administered 2017-03-16 (×2): 50 ug via INTRAVENOUS

## 2017-03-16 MED ORDER — LIDOCAINE 2% (20 MG/ML) 5 ML SYRINGE
INTRAMUSCULAR | Status: DC | PRN
  Administered 2017-03-16: 60 mg via INTRAVENOUS

## 2017-03-16 MED ORDER — SODIUM CHLORIDE 0.9 % IR SOLN
Status: DC | PRN
  Administered 2017-03-16: 6000 mL

## 2017-03-16 MED ORDER — DEXTROSE 5 % IV SOLN
2.0000 g | INTRAVENOUS | Status: AC
  Administered 2017-03-16: 2 g via INTRAVENOUS
  Filled 2017-03-16: qty 2

## 2017-03-16 MED ORDER — FENTANYL CITRATE (PF) 100 MCG/2ML IJ SOLN: 25.0000 ug | INTRAMUSCULAR | Status: DC | PRN

## 2017-03-16 MED ORDER — PHENYLEPHRINE HCL 10 MG/ML IJ SOLN
INTRAMUSCULAR | Status: DC | PRN
  Administered 2017-03-16: 80 ug via INTRAVENOUS

## 2017-03-16 MED ORDER — STERILE WATER FOR IRRIGATION IR SOLN
Status: DC | PRN
  Administered 2017-03-16: 5 mL via INTRAVESICAL

## 2017-03-16 MED ORDER — ONDANSETRON HCL 4 MG/2ML IJ SOLN
INTRAMUSCULAR | Status: AC
  Filled 2017-03-16: qty 2

## 2017-03-16 MED ORDER — LACTATED RINGERS IV SOLN
INTRAVENOUS | Status: DC
  Administered 2017-03-16 (×2): via INTRAVENOUS

## 2017-03-16 MED ORDER — ACETAMINOPHEN 325 MG PO TABS: 650.0000 mg | ORAL_TABLET | ORAL | Status: DC | PRN

## 2017-03-16 MED ORDER — 0.9 % SODIUM CHLORIDE (POUR BTL) OPTIME
TOPICAL | Status: DC | PRN
  Administered 2017-03-16: 1000 mL

## 2017-03-16 MED ORDER — FENTANYL CITRATE (PF) 100 MCG/2ML IJ SOLN
INTRAMUSCULAR | Status: AC
  Filled 2017-03-16: qty 2

## 2017-03-16 SURGICAL SUPPLY — 18 items
BAG URINE DRAINAGE (UROLOGICAL SUPPLIES) IMPLANT
BAG URO CATCHER STRL LF (MISCELLANEOUS) ×4 IMPLANT
CATH INTERMIT  6FR 70CM (CATHETERS) ×4 IMPLANT
CLOTH BEACON ORANGE TIMEOUT ST (SAFETY) ×4 IMPLANT
COVER FOOTSWITCH UNIV (MISCELLANEOUS) IMPLANT
COVER SURGICAL LIGHT HANDLE (MISCELLANEOUS) ×2 IMPLANT
ELECT REM PT RETURN 15FT ADLT (MISCELLANEOUS) ×2 IMPLANT
GLOVE BIOGEL M STRL SZ7.5 (GLOVE) ×4 IMPLANT
GOWN STRL REUS W/TWL LRG LVL3 (GOWN DISPOSABLE) ×8 IMPLANT
GUIDEWIRE STR DUAL SENSOR (WIRE) ×4 IMPLANT
LOOP CUT BIPOLAR 24F LRG (ELECTROSURGICAL) ×2 IMPLANT
MANIFOLD NEPTUNE II (INSTRUMENTS) ×4 IMPLANT
PACK CYSTO (CUSTOM PROCEDURE TRAY) ×4 IMPLANT
SET ASPIRATION TUBING (TUBING) IMPLANT
STENT CONTOUR 6FRX26X.038 (STENTS) ×2 IMPLANT
SYRINGE IRR TOOMEY STRL 70CC (SYRINGE) IMPLANT
TUBING CONNECTING 10 (TUBING) ×3 IMPLANT
TUBING CONNECTING 10' (TUBING) ×1

## 2017-03-16 NOTE — Op Note (Signed)
Preoperative diagnosis: Bladder cancer and right hydronephrosis  Postoperative diagnosis: Bladder cancer and right hydronephrosis  Procedures: 1.  Cystoscopy 2.  Right ureteral stent placement (6 x 26 with no string) 3.  Transurethral resection of bladder tumor (3 cm)  Surgeon: Pryor Curia. M.D.  Anesthesia: General  Complications: None  EBL: Minimal  Intraoperative findings: Cystoscopy revealed a raised and erythematous area along the right hemitrigone near the right ureteral orifice and prior resection site and site treated with radiation.  The right ureteral orifice was actually identified and appeared widely patent.  Indication: Brian Montgomery is a 81 year old gentleman who has been treated with radiation therapy and chemotherapy for muscle invasive bladder cancer with curative intent.  He recently presented with new onset right-sided hydronephrosis concerning for right ureteral obstruction and had cystoscopic findings possibly raising concern for tumor recurrence along the right hemitrigone.  We therefore discussed proceeding with the above procedures.  We discussed the potential risks, complications, and the expected recovery process.  He gave informed consent to proceed.  Description of procedure: The patient was taken to the operating room and general anesthetic was administered.  He was given preoperative antibiotics, placed in the dorsal lithotomy position, and prepped and draped in the usual sterile fashion.  Next, a preoperative timeout was performed.  Cystourethroscopy was then performed with the 30 lens.  The bladder was examined systematically and there did appear to be some distortion of the bladder based on the patient's prior right-sided tumor resection site which appeared blanched.  Near the right hemitrigone distal to the right ureteral orifice, there was noted to be a raised and somewhat more erythematous area.  This did not look to be obvious tumor although  malignancy could not be ruled out either.  No other tumors or other mucosal abnormalities were noted aside from moderate trabeculation throughout the bladder.  The right ureteral orifice was eventually identified just beyond the trigonal ridge and near the prior resection site.  The ureteral orifice appeared widely patent at its entry into the bladder.  However, considering the patient's new onset right-sided hydronephrosis on imaging, it was decided to proceed with right ureteral stent placement.  A 0.038 sensor guidewire was advanced up the right ureter into the renal pelvis under fluoroscopic guidance.  A 6 x 26 double-J ureteral stent was advanced over the wire using Seldinger technique.  This was appropriately positioned under fluoroscopic and cystoscopic guidance and the wire was removed with the proximal and distal curls of the stent noted to be in proper position.  Attention then returned to the bladder.  The 26 French resectoscope sheath was inserted with the visual obturator.  Using loop cutting bipolar resection, the aforementioned erythematous area in the right hemitrigone was resected until all visible abnormalities were removed in this area.  Hemostasis was achieved with cautery.  The bladder was emptied and reinspected and there appeared to be no ongoing bleeding.  The patient tolerated the procedure well without complications.  He was able to be extubated and transferred to the recovery unit in satisfactory condition.

## 2017-03-16 NOTE — Anesthesia Preprocedure Evaluation (Addendum)
Anesthesia Evaluation  Patient identified by MRN, date of birth, ID band Patient awake    Reviewed: Allergy & Precautions, NPO status , Patient's Chart, lab work & pertinent test results  Airway Mallampati: II  TM Distance: >3 FB Neck ROM: Full    Dental  (+) Edentulous Upper, Dental Advisory Given   Pulmonary former smoker,    breath sounds clear to auscultation       Cardiovascular hypertension,  Rhythm:Regular Rate:Normal     Neuro/Psych    GI/Hepatic   Endo/Other    Renal/GU      Musculoskeletal   Abdominal   Peds  Hematology   Anesthesia Other Findings   Reproductive/Obstetrics                            Anesthesia Physical Anesthesia Plan  ASA: III  Anesthesia Plan: General   Post-op Pain Management:    Induction: Intravenous  PONV Risk Score and Plan: Dexamethasone and Ondansetron  Airway Management Planned: Oral ETT  Additional Equipment:   Intra-op Plan:   Post-operative Plan: Extubation in OR  Informed Consent: I have reviewed the patients History and Physical, chart, labs and discussed the procedure including the risks, benefits and alternatives for the proposed anesthesia with the patient or authorized representative who has indicated his/her understanding and acceptance.   Dental advisory given  Plan Discussed with: Anesthesiologist and CRNA  Anesthesia Plan Comments:         Anesthesia Quick Evaluation

## 2017-03-16 NOTE — Anesthesia Procedure Notes (Signed)
Procedure Name: Intubation Date/Time: 03/16/2017 10:51 AM Performed by: Danley Danker L Patient Re-evaluated:Patient Re-evaluated prior to induction Oxygen Delivery Method: Circle system utilized Preoxygenation: Pre-oxygenation with 100% oxygen Induction Type: IV induction Ventilation: Mask ventilation without difficulty and Oral airway inserted - appropriate to patient size Laryngoscope Size: Miller and 3 Grade View: Grade I Tube type: Oral Tube size: 8.0 mm Number of attempts: 1 Airway Equipment and Method: Stylet Placement Confirmation: ETT inserted through vocal cords under direct vision,  positive ETCO2 and breath sounds checked- equal and bilateral Secured at: 22 cm Tube secured with: Tape Dental Injury: Teeth and Oropharynx as per pre-operative assessment

## 2017-03-16 NOTE — Transfer of Care (Signed)
Immediate Anesthesia Transfer of Care Note  Patient: Brian Montgomery  Procedure(s) Performed: Procedure(s): TRANSURETHRAL RESECTION OF BLADDER TUMOR (TURBT) (N/A) CYSTOSCOPY WITH STENT PLACEMENT (Right)  Patient Location: PACU  Anesthesia Type:General  Level of Consciousness: awake  Airway & Oxygen Therapy: Patient Spontanous Breathing and Patient connected to face mask oxygen  Post-op Assessment: Report given to RN and Post -op Vital signs reviewed and stable  Post vital signs: Reviewed and stable  Last Vitals:  Vitals:   03/16/17 1011  BP: 115/65  Pulse: 61  Resp: 18  Temp: 36.9 C  SpO2: 98%    Last Pain:  Vitals:   03/16/17 1011  TempSrc: Oral      Patients Stated Pain Goal: 3 (30/09/23 3007)  Complications: No apparent anesthesia complications

## 2017-03-16 NOTE — Progress Notes (Signed)
Patient ID: Brian Montgomery, male   DOB: Apr 17, 1936, 81 y.o.   MRN: [id number]  Post-op note  Subjective: The patient is doing well.  No complaints.  Objective: Vital signs in last 24 hours: Temp:  [97.7 F (36.5 C)-98.4 F (36.9 C)] 97.7 F (36.5 C) (09/06 1500) Pulse Rate:  [56-74] 74 (09/06 1500) Resp:  [16-21] 16 (09/06 1500) BP: (115-155)/(65-84) 131/69 (09/06 1500) SpO2:  [98 %-100 %] 100 % (09/06 1500) Weight:  [64.7 kg (142 lb 9.6 oz)] 64.7 kg (142 lb 9.6 oz) (09/06 0955)  Intake/Output from previous day: No intake/output data recorded. Intake/Output this shift: Total I/O In: 1450 [I.V.:1400; IV Piggyback:50] Out: 100 [Urine:100]  Physical Exam:  General: Alert and oriented. Abdomen: Soft, Nondistended.   Lab Results: No results for input(s): HGB, HCT in the last 72 hours.  Assessment/Plan: POD#0   1) Continue to monitor   Pryor Curia. MD   LOS: 0 days   Jennefer Kopp,LES 03/16/2017, 5:42 PM

## 2017-03-16 NOTE — Discharge Instructions (Addendum)

## 2017-03-16 NOTE — Anesthesia Postprocedure Evaluation (Signed)
Anesthesia Post Note  Patient: [name]  Procedure(s) Performed: Procedure(s) (LRB): TRANSURETHRAL RESECTION OF BLADDER TUMOR (TURBT) (N/A) CYSTOSCOPY WITH STENT PLACEMENT (Right)     Patient location during evaluation: PACU Anesthesia Type: General Level of consciousness: awake, awake and alert and oriented Pain management: pain level controlled Vital Signs Assessment: post-procedure vital signs reviewed and stable Respiratory status: spontaneous breathing, nonlabored ventilation and respiratory function stable Cardiovascular status: blood pressure returned to baseline Anesthetic complications: no    Last Vitals:  Vitals:   03/16/17 1430 03/16/17 1500  BP: (!) 155/84 131/69  Pulse: 70 74  Resp: (!) 21 16  Temp: 36.6 C 36.5 C  SpO2: 100% 100%    Last Pain:  Vitals:   03/16/17 1515  TempSrc:   PainSc: 0-No pain                 Jeffre Enriques COKER

## 2017-03-16 NOTE — Progress Notes (Signed)
Received patient form PACU, VS obtained, IVFs infusing, no c/o pain at this time, oriented to unit, call light placed in reach

## 2017-03-16 NOTE — Interval H&P Note (Signed)
History and Physical Interval Note:  03/16/2017 10:39 AM  Brian Montgomery  has presented today for surgery, with the diagnosis of RIGHT HYDRONEPHROSIS, BLADDER CANCER  The various methods of treatment have been discussed with the patient and family. After consideration of risks, benefits and other options for treatment, the patient has consented to  Procedure(s): TRANSURETHRAL RESECTION OF BLADDER TUMOR (TURBT) (N/A) CYSTOSCOPY WITH STENT PLACEMENT (Right) as a surgical intervention .  The patient's history has been reviewed, patient examined, no change in status, stable for surgery.  I have reviewed the patient's chart and labs.  Questions were answered to the patient's satisfaction.     Mahayla Haddaway,LES

## 2017-03-17 DIAGNOSIS — N1330 Unspecified hydronephrosis: Secondary | ICD-10-CM | POA: Diagnosis not present

## 2017-03-17 DIAGNOSIS — C679 Malignant neoplasm of bladder, unspecified: Secondary | ICD-10-CM | POA: Diagnosis not present

## 2017-03-17 DIAGNOSIS — Z8051 Family history of malignant neoplasm of kidney: Secondary | ICD-10-CM | POA: Diagnosis not present

## 2017-03-17 DIAGNOSIS — C61 Malignant neoplasm of prostate: Secondary | ICD-10-CM | POA: Diagnosis not present

## 2017-03-17 DIAGNOSIS — G20 Parkinson's disease: Secondary | ICD-10-CM | POA: Diagnosis not present

## 2017-03-17 DIAGNOSIS — I10 Essential (primary) hypertension: Secondary | ICD-10-CM | POA: Diagnosis not present

## 2017-03-17 NOTE — Progress Notes (Signed)
Patient ID: Donold Marotto, male   DOB: 1935-11-03, 81 y.o.   MRN: [id number]  1 Day Post-Op Subjective: Pt without complaints.  No flank or abdominal pain s/p right ureteral stent placement.  Objective: Vital signs in last 24 hours: Temp:  [97.7 F (36.5 C)-98.4 F (36.9 C)] 97.9 F (36.6 C) (09/07 0507) Pulse Rate:  [56-82] 72 (09/07 0620) Resp:  [15-21] 15 (09/07 0507) BP: (100-155)/(48-84) 118/51 (09/07 0620) SpO2:  [96 %-100 %] 100 % (09/07 0507) Weight:  [64.7 kg (142 lb 9.6 oz)] 64.7 kg (142 lb 9.6 oz) (09/06 0955)  Intake/Output from previous day: 09/06 0701 - 09/07 0700 In: 1450 [I.V.:1400; IV Piggyback:50] Out: 440 [Urine:440] Intake/Output this shift: No intake/output data recorded.  Physical Exam:  General: Alert and oriented Abdomen: Soft, ND, No CVAT    Assessment/Plan: D/C home this morning   LOS: 0 days   Johnnette Laux,LES 03/17/2017, 7:40 AM

## 2017-03-17 NOTE — Progress Notes (Signed)
Patient and family given discharge, follow up, and medication instructions, verbalized understanding, IV removed, personal belongings with patient, family to transport home  

## 2017-03-17 NOTE — Discharge Summary (Signed)
Date of admission: 03/16/2017  Date of discharge: 03/17/2017  Admission diagnosis: Right hydronephrosis, Bladder cancer  Discharge diagnosis: Right hydronephrosis, Bladder cancer  Secondary diagnoses: Hypertension, Parkinson's disease  History and Physical: For full details, please see admission history and physical. Briefly, Brian Montgomery is a 81 y.o. year old patient with muscle invasive bladder cancer s/p treatment with trimodality therapy.  He recently presented with right hydronephrosis and a questionable recurrence.   Hospital Course: He underwent cystoscopy with right ureteral stent placement and TURBT.  He tolerated his procedure well and without complications.  He was observed overnight due to his frailty.  He remained stable and was ready for discharge home on POD #1.   Disposition: Home  Discharge instruction: The patient was instructed to be ambulatory but told to refrain from heavy lifting, strenuous activity, or driving.   Discharge medications:  Allergies as of 03/17/2017   No Known Allergies     Medication List    TAKE these medications   benzonatate 100 MG capsule Commonly known as:  TESSALON Take 1 capsule (100 mg total) by mouth 3 (three) times daily as needed for cough. What changed:  when to take this   carbidopa-levodopa 25-100 MG tablet Commonly known as:  SINEMET IR Take 1.5 tablets by mouth 3 (three) times daily.   citalopram 10 MG tablet Commonly known as:  CELEXA TAKE 1 TABLET(10 MG) BY MOUTH DAILY   donepezil 10 MG tablet Commonly known as:  ARICEPT Take 1 tablet (10 mg total) by mouth at bedtime.   DRY EYES OP Apply 1 drop to eye daily as needed (dry eyes).   Foam Cushion Misc Wheelchair cushion   HYDROcodone-acetaminophen 5-325 MG tablet Commonly known as:  NORCO/VICODIN Take 1-2 tablets by mouth every 6 (six) hours as needed for moderate pain. What changed:  how much to take  when to take this   lisinopril-hydrochlorothiazide 10-12.5 MG  tablet Commonly known as:  PRINZIDE,ZESTORETIC TAKE 1 TABLET BY MOUTH DAILY   loperamide 2 MG capsule Commonly known as:  IMODIUM Take 1 capsule (2 mg total) by mouth as needed for diarrhea or loose stools. What changed:  when to take this   phenazopyridine 100 MG tablet Commonly known as:  PYRIDIUM Take 1 tablet (100 mg total) by mouth 3 (three) times daily as needed for pain (for burning). What changed:  Another medication with the same name was added. Make sure you understand how and when to take each.   phenazopyridine 100 MG tablet Commonly known as:  PYRIDIUM Take 1 tablet (100 mg total) by mouth 3 (three) times daily as needed for pain (for burning). What changed:  You were already taking a medication with the same name, and this prescription was added. Make sure you understand how and when to take each.   pseudoephedrine-guaifenesin 60-600 MG 12 hr tablet Commonly known as:  MUCINEX D Take 1 tablet by mouth at bedtime as needed for congestion.   simvastatin 20 MG tablet Commonly known as:  ZOCOR TAKE 1 TABLET BY MOUTH DAILY            Discharge Care Instructions        Start     Ordered   03/16/17 0000  phenazopyridine (PYRIDIUM) 100 MG tablet  3 times daily PRN     03/16/17 1209      Followup:  Follow-up Information    Raynelle Bring, MD Follow up.   Specialty:  Urology Why:  03/28/17 at 1:30 PM Contact information:  [place] [address] [phone]

## 2017-03-28 DIAGNOSIS — C678 Malignant neoplasm of overlapping sites of bladder: Secondary | ICD-10-CM | POA: Diagnosis not present

## 2017-03-28 DIAGNOSIS — N130 Hydronephrosis with ureteropelvic junction obstruction: Secondary | ICD-10-CM | POA: Diagnosis not present

## 2017-03-28 DIAGNOSIS — C61 Malignant neoplasm of prostate: Secondary | ICD-10-CM | POA: Diagnosis not present

## 2017-03-30 ENCOUNTER — Other Ambulatory Visit: Payer: Self-pay | Admitting: Neurology

## 2017-04-11 ENCOUNTER — Encounter: Payer: Self-pay | Admitting: Family Medicine

## 2017-04-11 ENCOUNTER — Ambulatory Visit (INDEPENDENT_AMBULATORY_CARE_PROVIDER_SITE_OTHER): Payer: Medicare Other | Admitting: Family Medicine

## 2017-04-11 VITALS — BP 124/72 | HR 67

## 2017-04-11 DIAGNOSIS — G609 Hereditary and idiopathic neuropathy, unspecified: Secondary | ICD-10-CM | POA: Diagnosis not present

## 2017-04-11 DIAGNOSIS — R2681 Unsteadiness on feet: Secondary | ICD-10-CM | POA: Diagnosis not present

## 2017-04-11 DIAGNOSIS — G231 Progressive supranuclear ophthalmoplegia [Steele-Richardson-Olszewski]: Secondary | ICD-10-CM | POA: Diagnosis not present

## 2017-04-11 DIAGNOSIS — Z23 Encounter for immunization: Secondary | ICD-10-CM | POA: Diagnosis not present

## 2017-04-11 NOTE — Progress Notes (Signed)
   Subjective:    Patient ID: [name], male    DOB: 13-Mar-1936, 81 y.o.   MRN: [id number]  HPI He is here for consult concerning the use of wheelchair. He does have progressive supranuclear palsy as well as a history of hip fracture, hereditary peripheral neuropathy and gait instability. He right now is using a wheelchair but does qualify for a wheelchair with tilt and recline due to inability to pressure relief. He will be evaluated by PT to verifies this   Review of Systems     Objective:   Physical Exam Alert and in no distress otherwise not examined       Assessment & Plan:  Need for influenza vaccination - Plan: Flu vaccine HIGH DOSE PF (Fluzone High dose)  PSP (progressive supranuclear palsy) ([place]) - Plan: Ambulatory referral to Physical Therapy  Hereditary and idiopathic peripheral neuropathy - Plan: Ambulatory referral to Physical Therapy  Gait instability - Plan: Ambulatory referral to Physical Therapy

## 2017-04-13 ENCOUNTER — Telehealth: Payer: Self-pay | Admitting: Family Medicine

## 2017-04-13 NOTE — Telephone Encounter (Signed)
[facility] with [facility] came in and dropped a form to be signed. Please call her at [phone] when ready.

## 2017-04-17 ENCOUNTER — Other Ambulatory Visit: Payer: Self-pay | Admitting: Neurology

## 2017-05-04 DIAGNOSIS — G231 Progressive supranuclear ophthalmoplegia [Steele-Richardson-Olszewski]: Secondary | ICD-10-CM | POA: Diagnosis not present

## 2017-05-04 DIAGNOSIS — G20 Parkinson's disease: Secondary | ICD-10-CM | POA: Diagnosis not present

## 2017-05-24 ENCOUNTER — Telehealth: Payer: Self-pay

## 2017-05-24 NOTE — Telephone Encounter (Signed)
yes

## 2017-05-24 NOTE — Telephone Encounter (Signed)
Spoke with Brian Montgomery in regards to f/u for home health PT. Per Katharine Look- this was sent to [facility]- will f/u as to why this has not been started. Victorino December

## 2017-05-24 NOTE — Telephone Encounter (Signed)
Faxed order to [facility] for SN and PT eval and treat.  LMTCB. Brian Montgomery

## 2017-05-24 NOTE — Telephone Encounter (Signed)
Spoke with [facility]- they do not have orders on this pt. This order was originally started by [facility].  Is it ok to send new orders for this pt for PT, SN to [facility]?

## 2017-05-25 ENCOUNTER — Telehealth: Payer: Self-pay | Admitting: Family Medicine

## 2017-05-25 NOTE — Telephone Encounter (Signed)
Pt's wife [name]- advised of referral. Advised to call me Monday if she has not gotten a CB. Victorino December

## 2017-05-25 NOTE — Telephone Encounter (Signed)
[name] returned your call.  [phone]

## 2017-05-26 DIAGNOSIS — Z87891 Personal history of nicotine dependence: Secondary | ICD-10-CM | POA: Diagnosis not present

## 2017-05-26 DIAGNOSIS — I129 Hypertensive chronic kidney disease with stage 1 through stage 4 chronic kidney disease, or unspecified chronic kidney disease: Secondary | ICD-10-CM | POA: Diagnosis not present

## 2017-05-26 DIAGNOSIS — G609 Hereditary and idiopathic neuropathy, unspecified: Secondary | ICD-10-CM | POA: Diagnosis not present

## 2017-05-26 DIAGNOSIS — N183 Chronic kidney disease, stage 3 (moderate): Secondary | ICD-10-CM | POA: Diagnosis not present

## 2017-05-26 DIAGNOSIS — E785 Hyperlipidemia, unspecified: Secondary | ICD-10-CM | POA: Diagnosis not present

## 2017-05-26 DIAGNOSIS — Z8551 Personal history of malignant neoplasm of bladder: Secondary | ICD-10-CM | POA: Diagnosis not present

## 2017-05-26 DIAGNOSIS — F0390 Unspecified dementia without behavioral disturbance: Secondary | ICD-10-CM | POA: Diagnosis not present

## 2017-05-26 DIAGNOSIS — Z8781 Personal history of (healed) traumatic fracture: Secondary | ICD-10-CM | POA: Diagnosis not present

## 2017-05-26 DIAGNOSIS — G231 Progressive supranuclear ophthalmoplegia [Steele-Richardson-Olszewski]: Secondary | ICD-10-CM | POA: Diagnosis not present

## 2017-05-26 DIAGNOSIS — Z8546 Personal history of malignant neoplasm of prostate: Secondary | ICD-10-CM | POA: Diagnosis not present

## 2017-05-29 DIAGNOSIS — F0390 Unspecified dementia without behavioral disturbance: Secondary | ICD-10-CM | POA: Diagnosis not present

## 2017-05-29 DIAGNOSIS — I129 Hypertensive chronic kidney disease with stage 1 through stage 4 chronic kidney disease, or unspecified chronic kidney disease: Secondary | ICD-10-CM | POA: Diagnosis not present

## 2017-05-29 DIAGNOSIS — G609 Hereditary and idiopathic neuropathy, unspecified: Secondary | ICD-10-CM | POA: Diagnosis not present

## 2017-05-29 DIAGNOSIS — E785 Hyperlipidemia, unspecified: Secondary | ICD-10-CM | POA: Diagnosis not present

## 2017-05-29 DIAGNOSIS — G231 Progressive supranuclear ophthalmoplegia [Steele-Richardson-Olszewski]: Secondary | ICD-10-CM | POA: Diagnosis not present

## 2017-05-29 DIAGNOSIS — N183 Chronic kidney disease, stage 3 (moderate): Secondary | ICD-10-CM | POA: Diagnosis not present

## 2017-05-30 DIAGNOSIS — E785 Hyperlipidemia, unspecified: Secondary | ICD-10-CM | POA: Diagnosis not present

## 2017-05-30 DIAGNOSIS — I129 Hypertensive chronic kidney disease with stage 1 through stage 4 chronic kidney disease, or unspecified chronic kidney disease: Secondary | ICD-10-CM | POA: Diagnosis not present

## 2017-05-30 DIAGNOSIS — G609 Hereditary and idiopathic neuropathy, unspecified: Secondary | ICD-10-CM | POA: Diagnosis not present

## 2017-05-30 DIAGNOSIS — G231 Progressive supranuclear ophthalmoplegia [Steele-Richardson-Olszewski]: Secondary | ICD-10-CM | POA: Diagnosis not present

## 2017-05-30 DIAGNOSIS — F0390 Unspecified dementia without behavioral disturbance: Secondary | ICD-10-CM | POA: Diagnosis not present

## 2017-05-30 DIAGNOSIS — N183 Chronic kidney disease, stage 3 (moderate): Secondary | ICD-10-CM | POA: Diagnosis not present

## 2017-05-31 DIAGNOSIS — N183 Chronic kidney disease, stage 3 (moderate): Secondary | ICD-10-CM | POA: Diagnosis not present

## 2017-05-31 DIAGNOSIS — G231 Progressive supranuclear ophthalmoplegia [Steele-Richardson-Olszewski]: Secondary | ICD-10-CM | POA: Diagnosis not present

## 2017-05-31 DIAGNOSIS — F0390 Unspecified dementia without behavioral disturbance: Secondary | ICD-10-CM | POA: Diagnosis not present

## 2017-05-31 DIAGNOSIS — E785 Hyperlipidemia, unspecified: Secondary | ICD-10-CM | POA: Diagnosis not present

## 2017-05-31 DIAGNOSIS — I129 Hypertensive chronic kidney disease with stage 1 through stage 4 chronic kidney disease, or unspecified chronic kidney disease: Secondary | ICD-10-CM | POA: Diagnosis not present

## 2017-05-31 DIAGNOSIS — G609 Hereditary and idiopathic neuropathy, unspecified: Secondary | ICD-10-CM | POA: Diagnosis not present

## 2017-06-02 DIAGNOSIS — I129 Hypertensive chronic kidney disease with stage 1 through stage 4 chronic kidney disease, or unspecified chronic kidney disease: Secondary | ICD-10-CM | POA: Diagnosis not present

## 2017-06-02 DIAGNOSIS — F0390 Unspecified dementia without behavioral disturbance: Secondary | ICD-10-CM | POA: Diagnosis not present

## 2017-06-02 DIAGNOSIS — G609 Hereditary and idiopathic neuropathy, unspecified: Secondary | ICD-10-CM | POA: Diagnosis not present

## 2017-06-02 DIAGNOSIS — E785 Hyperlipidemia, unspecified: Secondary | ICD-10-CM | POA: Diagnosis not present

## 2017-06-02 DIAGNOSIS — G231 Progressive supranuclear ophthalmoplegia [Steele-Richardson-Olszewski]: Secondary | ICD-10-CM | POA: Diagnosis not present

## 2017-06-02 DIAGNOSIS — N183 Chronic kidney disease, stage 3 (moderate): Secondary | ICD-10-CM | POA: Diagnosis not present

## 2017-06-05 DIAGNOSIS — N183 Chronic kidney disease, stage 3 (moderate): Secondary | ICD-10-CM | POA: Diagnosis not present

## 2017-06-05 DIAGNOSIS — G231 Progressive supranuclear ophthalmoplegia [Steele-Richardson-Olszewski]: Secondary | ICD-10-CM | POA: Diagnosis not present

## 2017-06-05 DIAGNOSIS — E785 Hyperlipidemia, unspecified: Secondary | ICD-10-CM | POA: Diagnosis not present

## 2017-06-05 DIAGNOSIS — I129 Hypertensive chronic kidney disease with stage 1 through stage 4 chronic kidney disease, or unspecified chronic kidney disease: Secondary | ICD-10-CM | POA: Diagnosis not present

## 2017-06-05 DIAGNOSIS — G609 Hereditary and idiopathic neuropathy, unspecified: Secondary | ICD-10-CM | POA: Diagnosis not present

## 2017-06-05 DIAGNOSIS — F0390 Unspecified dementia without behavioral disturbance: Secondary | ICD-10-CM | POA: Diagnosis not present

## 2017-06-06 ENCOUNTER — Other Ambulatory Visit: Payer: Self-pay | Admitting: Urology

## 2017-06-07 DIAGNOSIS — N183 Chronic kidney disease, stage 3 (moderate): Secondary | ICD-10-CM | POA: Diagnosis not present

## 2017-06-07 DIAGNOSIS — E785 Hyperlipidemia, unspecified: Secondary | ICD-10-CM | POA: Diagnosis not present

## 2017-06-07 DIAGNOSIS — I129 Hypertensive chronic kidney disease with stage 1 through stage 4 chronic kidney disease, or unspecified chronic kidney disease: Secondary | ICD-10-CM | POA: Diagnosis not present

## 2017-06-07 DIAGNOSIS — G609 Hereditary and idiopathic neuropathy, unspecified: Secondary | ICD-10-CM | POA: Diagnosis not present

## 2017-06-07 DIAGNOSIS — G231 Progressive supranuclear ophthalmoplegia [Steele-Richardson-Olszewski]: Secondary | ICD-10-CM | POA: Diagnosis not present

## 2017-06-07 DIAGNOSIS — F0390 Unspecified dementia without behavioral disturbance: Secondary | ICD-10-CM | POA: Diagnosis not present

## 2017-06-08 ENCOUNTER — Other Ambulatory Visit: Payer: Self-pay | Admitting: Family Medicine

## 2017-06-08 DIAGNOSIS — E785 Hyperlipidemia, unspecified: Secondary | ICD-10-CM | POA: Diagnosis not present

## 2017-06-08 DIAGNOSIS — G231 Progressive supranuclear ophthalmoplegia [Steele-Richardson-Olszewski]: Secondary | ICD-10-CM | POA: Diagnosis not present

## 2017-06-08 DIAGNOSIS — N183 Chronic kidney disease, stage 3 (moderate): Secondary | ICD-10-CM | POA: Diagnosis not present

## 2017-06-08 DIAGNOSIS — G609 Hereditary and idiopathic neuropathy, unspecified: Secondary | ICD-10-CM | POA: Diagnosis not present

## 2017-06-08 DIAGNOSIS — F0390 Unspecified dementia without behavioral disturbance: Secondary | ICD-10-CM | POA: Diagnosis not present

## 2017-06-08 DIAGNOSIS — I129 Hypertensive chronic kidney disease with stage 1 through stage 4 chronic kidney disease, or unspecified chronic kidney disease: Secondary | ICD-10-CM | POA: Diagnosis not present

## 2017-06-08 NOTE — Telephone Encounter (Signed)
Called pt he is getting ready to go in for stent and when he is out and able he will make appt for med check

## 2017-06-09 DIAGNOSIS — F0390 Unspecified dementia without behavioral disturbance: Secondary | ICD-10-CM | POA: Diagnosis not present

## 2017-06-09 DIAGNOSIS — N183 Chronic kidney disease, stage 3 (moderate): Secondary | ICD-10-CM | POA: Diagnosis not present

## 2017-06-09 DIAGNOSIS — G231 Progressive supranuclear ophthalmoplegia [Steele-Richardson-Olszewski]: Secondary | ICD-10-CM | POA: Diagnosis not present

## 2017-06-09 DIAGNOSIS — G609 Hereditary and idiopathic neuropathy, unspecified: Secondary | ICD-10-CM | POA: Diagnosis not present

## 2017-06-09 DIAGNOSIS — I129 Hypertensive chronic kidney disease with stage 1 through stage 4 chronic kidney disease, or unspecified chronic kidney disease: Secondary | ICD-10-CM | POA: Diagnosis not present

## 2017-06-09 DIAGNOSIS — E785 Hyperlipidemia, unspecified: Secondary | ICD-10-CM | POA: Diagnosis not present

## 2017-06-12 DIAGNOSIS — N183 Chronic kidney disease, stage 3 (moderate): Secondary | ICD-10-CM | POA: Diagnosis not present

## 2017-06-12 DIAGNOSIS — F0390 Unspecified dementia without behavioral disturbance: Secondary | ICD-10-CM | POA: Diagnosis not present

## 2017-06-12 DIAGNOSIS — G231 Progressive supranuclear ophthalmoplegia [Steele-Richardson-Olszewski]: Secondary | ICD-10-CM | POA: Diagnosis not present

## 2017-06-12 DIAGNOSIS — I129 Hypertensive chronic kidney disease with stage 1 through stage 4 chronic kidney disease, or unspecified chronic kidney disease: Secondary | ICD-10-CM | POA: Diagnosis not present

## 2017-06-12 DIAGNOSIS — E785 Hyperlipidemia, unspecified: Secondary | ICD-10-CM | POA: Diagnosis not present

## 2017-06-12 DIAGNOSIS — G609 Hereditary and idiopathic neuropathy, unspecified: Secondary | ICD-10-CM | POA: Diagnosis not present

## 2017-06-13 DIAGNOSIS — E785 Hyperlipidemia, unspecified: Secondary | ICD-10-CM | POA: Diagnosis not present

## 2017-06-13 DIAGNOSIS — F0390 Unspecified dementia without behavioral disturbance: Secondary | ICD-10-CM | POA: Diagnosis not present

## 2017-06-13 DIAGNOSIS — G609 Hereditary and idiopathic neuropathy, unspecified: Secondary | ICD-10-CM | POA: Diagnosis not present

## 2017-06-13 DIAGNOSIS — I129 Hypertensive chronic kidney disease with stage 1 through stage 4 chronic kidney disease, or unspecified chronic kidney disease: Secondary | ICD-10-CM | POA: Diagnosis not present

## 2017-06-13 DIAGNOSIS — G231 Progressive supranuclear ophthalmoplegia [Steele-Richardson-Olszewski]: Secondary | ICD-10-CM | POA: Diagnosis not present

## 2017-06-13 DIAGNOSIS — N183 Chronic kidney disease, stage 3 (moderate): Secondary | ICD-10-CM | POA: Diagnosis not present

## 2017-06-14 DIAGNOSIS — F0390 Unspecified dementia without behavioral disturbance: Secondary | ICD-10-CM | POA: Diagnosis not present

## 2017-06-14 DIAGNOSIS — E785 Hyperlipidemia, unspecified: Secondary | ICD-10-CM | POA: Diagnosis not present

## 2017-06-14 DIAGNOSIS — G231 Progressive supranuclear ophthalmoplegia [Steele-Richardson-Olszewski]: Secondary | ICD-10-CM | POA: Diagnosis not present

## 2017-06-14 DIAGNOSIS — I129 Hypertensive chronic kidney disease with stage 1 through stage 4 chronic kidney disease, or unspecified chronic kidney disease: Secondary | ICD-10-CM | POA: Diagnosis not present

## 2017-06-14 DIAGNOSIS — G609 Hereditary and idiopathic neuropathy, unspecified: Secondary | ICD-10-CM | POA: Diagnosis not present

## 2017-06-14 DIAGNOSIS — N183 Chronic kidney disease, stage 3 (moderate): Secondary | ICD-10-CM | POA: Diagnosis not present

## 2017-06-15 DIAGNOSIS — C679 Malignant neoplasm of bladder, unspecified: Secondary | ICD-10-CM | POA: Diagnosis not present

## 2017-06-15 DIAGNOSIS — N131 Hydronephrosis with ureteral stricture, not elsewhere classified: Secondary | ICD-10-CM | POA: Diagnosis not present

## 2017-06-15 DIAGNOSIS — N3000 Acute cystitis without hematuria: Secondary | ICD-10-CM | POA: Diagnosis not present

## 2017-06-16 DIAGNOSIS — G231 Progressive supranuclear ophthalmoplegia [Steele-Richardson-Olszewski]: Secondary | ICD-10-CM | POA: Diagnosis not present

## 2017-06-16 DIAGNOSIS — N183 Chronic kidney disease, stage 3 (moderate): Secondary | ICD-10-CM | POA: Diagnosis not present

## 2017-06-16 DIAGNOSIS — G609 Hereditary and idiopathic neuropathy, unspecified: Secondary | ICD-10-CM | POA: Diagnosis not present

## 2017-06-16 DIAGNOSIS — F0390 Unspecified dementia without behavioral disturbance: Secondary | ICD-10-CM | POA: Diagnosis not present

## 2017-06-16 DIAGNOSIS — I129 Hypertensive chronic kidney disease with stage 1 through stage 4 chronic kidney disease, or unspecified chronic kidney disease: Secondary | ICD-10-CM | POA: Diagnosis not present

## 2017-06-16 DIAGNOSIS — E785 Hyperlipidemia, unspecified: Secondary | ICD-10-CM | POA: Diagnosis not present

## 2017-06-20 DIAGNOSIS — E785 Hyperlipidemia, unspecified: Secondary | ICD-10-CM | POA: Diagnosis not present

## 2017-06-20 DIAGNOSIS — F0390 Unspecified dementia without behavioral disturbance: Secondary | ICD-10-CM | POA: Diagnosis not present

## 2017-06-20 DIAGNOSIS — G609 Hereditary and idiopathic neuropathy, unspecified: Secondary | ICD-10-CM | POA: Diagnosis not present

## 2017-06-20 DIAGNOSIS — G231 Progressive supranuclear ophthalmoplegia [Steele-Richardson-Olszewski]: Secondary | ICD-10-CM | POA: Diagnosis not present

## 2017-06-20 DIAGNOSIS — N183 Chronic kidney disease, stage 3 (moderate): Secondary | ICD-10-CM | POA: Diagnosis not present

## 2017-06-20 DIAGNOSIS — I129 Hypertensive chronic kidney disease with stage 1 through stage 4 chronic kidney disease, or unspecified chronic kidney disease: Secondary | ICD-10-CM | POA: Diagnosis not present

## 2017-06-21 DIAGNOSIS — G609 Hereditary and idiopathic neuropathy, unspecified: Secondary | ICD-10-CM | POA: Diagnosis not present

## 2017-06-21 DIAGNOSIS — E785 Hyperlipidemia, unspecified: Secondary | ICD-10-CM | POA: Diagnosis not present

## 2017-06-21 DIAGNOSIS — F0390 Unspecified dementia without behavioral disturbance: Secondary | ICD-10-CM | POA: Diagnosis not present

## 2017-06-21 DIAGNOSIS — I129 Hypertensive chronic kidney disease with stage 1 through stage 4 chronic kidney disease, or unspecified chronic kidney disease: Secondary | ICD-10-CM | POA: Diagnosis not present

## 2017-06-21 DIAGNOSIS — G231 Progressive supranuclear ophthalmoplegia [Steele-Richardson-Olszewski]: Secondary | ICD-10-CM | POA: Diagnosis not present

## 2017-06-21 DIAGNOSIS — N183 Chronic kidney disease, stage 3 (moderate): Secondary | ICD-10-CM | POA: Diagnosis not present

## 2017-06-21 NOTE — Patient Instructions (Signed)
Brian Montgomery  06/21/2017   Your procedure is scheduled on: 06-26-17  Report to [facility] Main  Entrance Take [place]  elevators to 3rd floor to  [place] at    [place] AM.    Call this number if you have problems the morning of surgery [phone]    Remember: ONLY 1 PERSON MAY GO WITH YOU TO SHORT STAY TO GET  READY MORNING OF YOUR SURGERY.  Do not eat food or drink liquids :After Midnight.     Take these medicines the morning of surgery with A SIP OF WATER: simvastatin(zocor), citalopram(celexa), carbidopa-levadopa (sinemet)                                You may not have any metal on your body including hair pins and              piercings  Do not wear jewelry,lotions, powders or perfumes, deodorant                      Men may shave face and neck.   Do not bring valuables to the hospital. [place].  Contacts, dentures or bridgework may not be worn into surgery.  Leave suitcase in the car. After surgery it may be brought to your room.               Please read over the following fact sheets you were given: _____________________________________________________________________           [facility] - Preparing for Surgery Before surgery, you can play an important role.  Because skin is not sterile, your skin needs to be as free of germs as possible.  You can reduce the number of germs on your skin by washing with CHG (chlorahexidine gluconate) soap before surgery.  CHG is an antiseptic cleaner which kills germs and bonds with the skin to continue killing germs even after washing. Please DO NOT use if you have an allergy to CHG or antibacterial soaps.  If your skin becomes reddened/irritated stop using the CHG and inform your nurse when you arrive at Short Stay. Do not shave (including legs and underarms) for at least 48 hours prior to the first CHG shower.  You may shave your face/neck. Please follow  these instructions carefully:  1.  Shower with CHG Soap the night before surgery and the  morning of Surgery.  2.  If you choose to wash your hair, wash your hair first as usual with your  normal  shampoo.  3.  After you shampoo, rinse your hair and body thoroughly to remove the  shampoo.                           4.  Use CHG as you would any other liquid soap.  You can apply chg directly  to the skin and wash                       Gently with a scrungie or clean washcloth.  5.  Apply the CHG Soap to your body ONLY FROM THE NECK DOWN.   Do not use on face/ open  Wound or open sores. Avoid contact with eyes, ears mouth and genitals (private parts).                       Wash face,  Genitals (private parts) with your normal soap.             6.  Wash thoroughly, paying special attention to the area where your surgery  will be performed.  7.  Thoroughly rinse your body with warm water from the neck down.  8.  DO NOT shower/wash with your normal soap after using and rinsing off  the CHG Soap.                9.  Pat yourself dry with a clean towel.            10.  Wear clean pajamas.            11.  Place clean sheets on your bed the night of your first shower and do not  sleep with pets. Day of Surgery : Do not apply any lotions/deodorants the morning of surgery.  Please wear clean clothes to the hospital/surgery center.  FAILURE TO FOLLOW THESE INSTRUCTIONS MAY RESULT IN THE CANCELLATION OF YOUR SURGERY PATIENT SIGNATURE_________________________________  NURSE SIGNATURE__________________________________  ________________________________________________________________________

## 2017-06-22 ENCOUNTER — Encounter (HOSPITAL_COMMUNITY): Payer: Self-pay

## 2017-06-22 ENCOUNTER — Encounter (HOSPITAL_COMMUNITY)
Admission: RE | Admit: 2017-06-22 | Discharge: 2017-06-22 | Disposition: A | Discharge status: Home / Self Care | Payer: Medicare Other | Source: Ambulatory Visit | Attending: Urology | Admitting: Urology

## 2017-06-22 ENCOUNTER — Other Ambulatory Visit: Payer: Self-pay

## 2017-06-22 DIAGNOSIS — Z01812 Encounter for preprocedural laboratory examination: Secondary | ICD-10-CM | POA: Insufficient documentation

## 2017-06-22 DIAGNOSIS — N135 Crossing vessel and stricture of ureter without hydronephrosis: Secondary | ICD-10-CM | POA: Insufficient documentation

## 2017-06-22 DIAGNOSIS — C679 Malignant neoplasm of bladder, unspecified: Secondary | ICD-10-CM | POA: Diagnosis not present

## 2017-06-22 LAB — CBC
HEMATOCRIT: 31.1 % — AB (ref 39.0–52.0)
Hemoglobin: 9.9 g/dL — ABNORMAL LOW (ref 13.0–17.0)
MCH: 28 pg (ref 26.0–34.0)
MCHC: 31.8 g/dL (ref 30.0–36.0)
MCV: 87.9 fL (ref 78.0–100.0)
Platelets: 280 10*3/uL (ref 150–400)
RBC: 3.54 MIL/uL — ABNORMAL LOW (ref 4.22–5.81)
RDW: 14.3 % (ref 11.5–15.5)
WBC: 5.4 10*3/uL (ref 4.0–10.5)

## 2017-06-22 LAB — BASIC METABOLIC PANEL
Anion gap: 9 (ref 5–15)
BUN: 33 mg/dL — AB (ref 6–20)
CHLORIDE: 107 mmol/L (ref 101–111)
CO2: 25 mmol/L (ref 22–32)
Calcium: 9.7 mg/dL (ref 8.9–10.3)
Creatinine, Ser: 1.73 mg/dL — ABNORMAL HIGH (ref 0.61–1.24)
GFR calc Af Amer: 41 mL/min — ABNORMAL LOW (ref >60)
GFR calc non Af Amer: 36 mL/min — ABNORMAL LOW (ref >60)
GLUCOSE: 92 mg/dL (ref 65–99)
POTASSIUM: 3.8 mmol/L (ref 3.5–5.1)
Sodium: 141 mmol/L (ref 135–145)

## 2017-06-22 NOTE — Progress Notes (Signed)
CBC AND cmp routed to Dr. Alinda Money via epic

## 2017-06-22 NOTE — Progress Notes (Signed)
ekg 07-01-16 epic  Echo 06-15-16 epic

## 2017-06-23 DIAGNOSIS — G231 Progressive supranuclear ophthalmoplegia [Steele-Richardson-Olszewski]: Secondary | ICD-10-CM | POA: Diagnosis not present

## 2017-06-23 DIAGNOSIS — I129 Hypertensive chronic kidney disease with stage 1 through stage 4 chronic kidney disease, or unspecified chronic kidney disease: Secondary | ICD-10-CM | POA: Diagnosis not present

## 2017-06-23 DIAGNOSIS — N183 Chronic kidney disease, stage 3 (moderate): Secondary | ICD-10-CM | POA: Diagnosis not present

## 2017-06-23 DIAGNOSIS — E785 Hyperlipidemia, unspecified: Secondary | ICD-10-CM | POA: Diagnosis not present

## 2017-06-23 DIAGNOSIS — F0390 Unspecified dementia without behavioral disturbance: Secondary | ICD-10-CM | POA: Diagnosis not present

## 2017-06-23 DIAGNOSIS — G609 Hereditary and idiopathic neuropathy, unspecified: Secondary | ICD-10-CM | POA: Diagnosis not present

## 2017-06-23 NOTE — H&P (Signed)
CC/HPI: 1. Bladder cancer  2. Prostate cancer  3. Right ureteral obstruction   Brian Montgomery has tolerated his stent well although does have some dysuria that has been of some bother. He is not currently taking Pyridium. He otherwise has no complaints. He denies any flank pain.     ALLERGIES: No Allergies    MEDICATIONS: Benzonatate 100 mg capsule  Carbidopa-Levodopa 25 mg-100 mg tablet  Citalopram Hbr 10 mg tablet  Donepezil Hcl 5 mg tablet Oral  Hydrocodone-Acetaminophen 5 mg-325 mg tablet  Lisinopril-Hydrochlorothiazide 10 mg-12.5 mg tablet  Prochlorperazine Maleate 10 mg tablet  Simvastatin 20 mg tablet Oral     GU PSH: Catheterization For Collection Of Specimen, Single Patient, All Places Of Service - 03/10/2017, 02/15/2017 Catheterize For Residual - 01/18/2016 Cysto Dilate Stricture (M or F) - 01/19/2016 Cystoscopy - 08/17/2016, 01/19/2016 Cystoscopy Insert Stent, Right - 03/16/2017 Cystoscopy Irrigate Clot - 01/21/2016 Cystoscopy TURBT <2 cm - 03/22/2016 Cystoscopy TURBT >5 cm - 01/21/2016 Cystoscopy TURBT 2-5 cm - 03/16/2017, 10/20/2016 Initial Male VB Sounds - 01/19/2016 Locm 300-399Mg /Ml Iodine,1Ml - 02/09/2017, 01/29/2016 PLACE RT DEVICE/MARKER, PROS - 04/13/2016 Prostate Needle Biopsy - 03/22/2016      PSH Notes: Back Surgery   NON-GU PSH: Hip Arthroscopy/surgery, Left    GU PMH: Acute Cystitis/UTI - 02/15/2017 Hydronephrosis - 02/15/2017 Prostate Cancer - 04/06/2016 Gross hematuria, Gross hematuria - 12/09/2015 Bladder Cancer overlapping sites      PMH Notes:   1) Prostate cancer: He presented with a prostate nodule and a PSA of 9.3. He underwent a TRUS biopsy demonstrating 5 out of 12 biopsy cores positive for malignancy. He was already proceeding with radiation for treatment of his bladder cancer and therefore elected to simultaneously treat his prostate cancer with EBRT as well.   Sep 2017: 12 core biopsy - 5/12 cores positive for Gleason 3+3=6 adenocarcinoma  Oct-Nov 2017:  71 Gy radiation with 45 Gy to pelvic lymph nodes (for bladder cancer)   2) Bladder cancer: He developed gross hematuria in May 2017. This was initially thought to be related to infection but persisted and ultimately he required catheterization. He underwent cystoscopy in the OR in July 2017 due to an inability to well visualize the bladder. He was ultimately found to have a large 6 cm bladder tumor and underwent TURBT. He was noted to have muscle invasive bladder cancer. He declined radical cystectomy and elected to proceed with radiation/chemotherapy trimodality therapy.   Jul 2017: Cysto (no definite tumors seen), large formed clot in bladder  Jul 2017: TURBT - High grade muscle invasive urothelial carcinoma  Sep 2017: TUR of residual tumor prior to radiation (residual low grade, Ta disease)  Oct-Nov 2017: 20 Gy to bladder, 45 Gy to pelvic lymph nodes (carboplatin chemotherapy)  Apr 2018: TUR - polypoid cystitis without evidence of malignancy  Aug 2018: Development of right hydronephrosis and questionable bladder recurrence  Sep 2018: TUR- negative for malignancy, Right ureteral stent placement   3) Right hydronephrosis: He was noted to have incidentally detected right hydronephrosis in the summer of 2018 suggestive of possible right ureteral obstruction. He underwent his initial right ureteral stent placement in September 2018.     NON-GU PMH: Personal history of other diseases of the nervous system and sense organs, History of polyneuropathy - 12/09/2015 Arthritis Hypercholesterolemia Hypertension    FAMILY HISTORY: Death of parent - Runs In Family malignant neoplasm of kidney - Runs In Family   SOCIAL HISTORY: Marital Status: Married Preferred Language: English; Ethnicity: ;  Race: Black or African American     Notes: Married, Number of children, Retired, Former smoker, Alcohol use   REVIEW OF SYSTEMS:    GU Review Male:   Patient denies frequent urination, hard to postpone  urination, burning/ pain with urination, get up at night to urinate, leakage of urine, stream starts and stops, trouble starting your streams, and have to strain to urinate .  Gastrointestinal (Lower):   Patient denies diarrhea and constipation.  Gastrointestinal (Upper):   Patient denies nausea and vomiting.  Constitutional:   Patient denies fever, night sweats, weight loss, and fatigue.  Skin:   Patient denies skin rash/ lesion and itching.  Eyes:   Patient denies blurred vision and double vision.  Ears/ Nose/ Throat:   Patient denies sore throat and sinus problems.  Hematologic/Lymphatic:   Patient denies swollen glands and easy bruising.  Cardiovascular:   Patient denies chest pains and leg swelling.  Respiratory:   Patient denies cough and shortness of breath.  Endocrine:   Patient denies excessive thirst.  Musculoskeletal:   Patient denies back pain and joint pain.  Neurological:   Patient denies headaches and dizziness.  Psychologic:   Patient denies depression and anxiety.   VITAL SIGNS:     Weight 145 lb / 65.77 kg  Height 63 in / 160.02 cm  BMI 25.7 kg/m   MULTI-SYSTEM PHYSICAL EXAMINATION:    Constitutional: Well-nourished. No physical deformities. Normally developed. Good grooming.  Lungs: Clear CV: RRR   ASSESSMENT:      ICD-10 Details  1 GU:   Prostate Cancer - C61   2   Bladder Cancer overlapping sites - C67.8   3   Hydronephrosis - N13.0    PLAN:      Right ureteral obstruction: He will plan to be scheduled for cystoscopy and right ureteral stent change in early December. His family will plan to notify me if he does have any significant decline in his health in the meantime. A preoperative urine culture and urinalysis will be obtained prior to his procedure through the hospital. I also will consider a Bard Inlay Optima 6 x 24 stent. He may be somewhat irritated by his longer stent that is currently indwelling.

## 2017-06-24 DIAGNOSIS — N183 Chronic kidney disease, stage 3 (moderate): Secondary | ICD-10-CM | POA: Diagnosis not present

## 2017-06-24 DIAGNOSIS — F0390 Unspecified dementia without behavioral disturbance: Secondary | ICD-10-CM | POA: Diagnosis not present

## 2017-06-24 DIAGNOSIS — E785 Hyperlipidemia, unspecified: Secondary | ICD-10-CM | POA: Diagnosis not present

## 2017-06-24 DIAGNOSIS — G231 Progressive supranuclear ophthalmoplegia [Steele-Richardson-Olszewski]: Secondary | ICD-10-CM | POA: Diagnosis not present

## 2017-06-24 DIAGNOSIS — G609 Hereditary and idiopathic neuropathy, unspecified: Secondary | ICD-10-CM | POA: Diagnosis not present

## 2017-06-24 DIAGNOSIS — I129 Hypertensive chronic kidney disease with stage 1 through stage 4 chronic kidney disease, or unspecified chronic kidney disease: Secondary | ICD-10-CM | POA: Diagnosis not present

## 2017-06-26 ENCOUNTER — Ambulatory Visit (HOSPITAL_COMMUNITY): Payer: Medicare Other

## 2017-06-26 ENCOUNTER — Encounter (HOSPITAL_COMMUNITY)
Admission: RE | Disposition: A | Discharge status: Home / Self Care | Payer: Self-pay | Source: Ambulatory Visit | Attending: Urology

## 2017-06-26 ENCOUNTER — Telehealth: Payer: Self-pay | Admitting: Family Medicine

## 2017-06-26 ENCOUNTER — Observation Stay (HOSPITAL_COMMUNITY)
Admission: RE | Admit: 2017-06-26 | Discharge: 2017-06-27 | Disposition: A | Discharge status: Home / Self Care | Payer: Medicare Other | Source: Ambulatory Visit | Attending: Urology | Admitting: Urology

## 2017-06-26 ENCOUNTER — Other Ambulatory Visit: Payer: Self-pay

## 2017-06-26 ENCOUNTER — Encounter (HOSPITAL_COMMUNITY): Payer: Self-pay | Admitting: *Deleted

## 2017-06-26 DIAGNOSIS — N131 Hydronephrosis with ureteral stricture, not elsewhere classified: Principal | ICD-10-CM | POA: Insufficient documentation

## 2017-06-26 DIAGNOSIS — Z79899 Other long term (current) drug therapy: Secondary | ICD-10-CM | POA: Diagnosis not present

## 2017-06-26 DIAGNOSIS — Z8051 Family history of malignant neoplasm of kidney: Secondary | ICD-10-CM | POA: Insufficient documentation

## 2017-06-26 DIAGNOSIS — I10 Essential (primary) hypertension: Secondary | ICD-10-CM | POA: Diagnosis not present

## 2017-06-26 DIAGNOSIS — Z87891 Personal history of nicotine dependence: Secondary | ICD-10-CM | POA: Diagnosis not present

## 2017-06-26 DIAGNOSIS — N135 Crossing vessel and stricture of ureter without hydronephrosis: Secondary | ICD-10-CM | POA: Diagnosis present

## 2017-06-26 DIAGNOSIS — H04123 Dry eye syndrome of bilateral lacrimal glands: Secondary | ICD-10-CM | POA: Insufficient documentation

## 2017-06-26 DIAGNOSIS — Z923 Personal history of irradiation: Secondary | ICD-10-CM | POA: Insufficient documentation

## 2017-06-26 DIAGNOSIS — C61 Malignant neoplasm of prostate: Secondary | ICD-10-CM | POA: Insufficient documentation

## 2017-06-26 DIAGNOSIS — Z993 Dependence on wheelchair: Secondary | ICD-10-CM | POA: Diagnosis not present

## 2017-06-26 DIAGNOSIS — F0390 Unspecified dementia without behavioral disturbance: Secondary | ICD-10-CM | POA: Diagnosis not present

## 2017-06-26 DIAGNOSIS — N183 Chronic kidney disease, stage 3 (moderate): Secondary | ICD-10-CM | POA: Diagnosis not present

## 2017-06-26 DIAGNOSIS — I959 Hypotension, unspecified: Secondary | ICD-10-CM | POA: Diagnosis not present

## 2017-06-26 DIAGNOSIS — G20 Parkinson's disease: Secondary | ICD-10-CM | POA: Diagnosis not present

## 2017-06-26 DIAGNOSIS — C679 Malignant neoplasm of bladder, unspecified: Secondary | ICD-10-CM | POA: Insufficient documentation

## 2017-06-26 DIAGNOSIS — I129 Hypertensive chronic kidney disease with stage 1 through stage 4 chronic kidney disease, or unspecified chronic kidney disease: Secondary | ICD-10-CM | POA: Diagnosis not present

## 2017-06-26 DIAGNOSIS — C678 Malignant neoplasm of overlapping sites of bladder: Secondary | ICD-10-CM | POA: Diagnosis not present

## 2017-06-26 HISTORY — PX: CYSTOSCOPY W/ URETERAL STENT PLACEMENT: SHX1429

## 2017-06-26 SURGERY — CYSTOSCOPY, FLEXIBLE, WITH STENT REPLACEMENT
Anesthesia: General | Laterality: Right

## 2017-06-26 MED ORDER — ONDANSETRON HCL 4 MG/2ML IJ SOLN: 4.0000 mg | INTRAMUSCULAR | Status: DC | PRN

## 2017-06-26 MED ORDER — CITALOPRAM HYDROBROMIDE 20 MG PO TABS
10.0000 mg | ORAL_TABLET | Freq: Every day | ORAL | Status: DC
  Administered 2017-06-27: 10 mg via ORAL
  Filled 2017-06-26: qty 1

## 2017-06-26 MED ORDER — GUAIFENESIN ER 600 MG PO TB12: 600.0000 mg | ORAL_TABLET | Freq: Every evening | ORAL | Status: DC | PRN

## 2017-06-26 MED ORDER — PSEUDOEPHEDRINE HCL 60 MG PO TABS
60.0000 mg | ORAL_TABLET | Freq: Every evening | ORAL | Status: DC | PRN
  Filled 2017-06-26: qty 1

## 2017-06-26 MED ORDER — DEXAMETHASONE SODIUM PHOSPHATE 10 MG/ML IJ SOLN
INTRAMUSCULAR | Status: DC | PRN
  Administered 2017-06-26: 4 mg via INTRAVENOUS

## 2017-06-26 MED ORDER — LACTATED RINGERS IV SOLN
INTRAVENOUS | Status: DC
  Administered 2017-06-26: 09:00:00 via INTRAVENOUS

## 2017-06-26 MED ORDER — ONDANSETRON HCL 4 MG/2ML IJ SOLN
INTRAMUSCULAR | Status: AC
  Filled 2017-06-26: qty 2

## 2017-06-26 MED ORDER — ONDANSETRON HCL 4 MG/2ML IJ SOLN: 4.0000 mg | Freq: Once | INTRAMUSCULAR | Status: DC | PRN

## 2017-06-26 MED ORDER — DIPHENHYDRAMINE HCL 50 MG/ML IJ SOLN: 12.5000 mg | Freq: Four times a day (QID) | INTRAMUSCULAR | Status: DC | PRN

## 2017-06-26 MED ORDER — PHENYLEPHRINE HCL 10 MG/ML IJ SOLN
INTRAMUSCULAR | Status: DC | PRN
  Administered 2017-06-26 (×3): 40 ug via INTRAVENOUS

## 2017-06-26 MED ORDER — CARBIDOPA-LEVODOPA 25-100 MG PO TABS
1.5000 | ORAL_TABLET | Freq: Three times a day (TID) | ORAL | Status: DC
  Administered 2017-06-26 – 2017-06-27 (×3): 1.5 via ORAL
  Filled 2017-06-26 (×4): qty 1.5
  Filled 2017-06-26: qty 2
  Filled 2017-06-26 (×2): qty 1.5
  Filled 2017-06-26: qty 2
  Filled 2017-06-26: qty 1.5

## 2017-06-26 MED ORDER — PROPOFOL 10 MG/ML IV BOLUS
INTRAVENOUS | Status: AC
  Filled 2017-06-26: qty 20

## 2017-06-26 MED ORDER — FENTANYL CITRATE (PF) 100 MCG/2ML IJ SOLN
INTRAMUSCULAR | Status: AC
  Filled 2017-06-26: qty 2

## 2017-06-26 MED ORDER — ONDANSETRON HCL 4 MG/2ML IJ SOLN
INTRAMUSCULAR | Status: DC | PRN
  Administered 2017-06-26: 4 mg via INTRAVENOUS

## 2017-06-26 MED ORDER — MELATONIN 3 MG PO CAPS: 3.0000 mg | ORAL_CAPSULE | Freq: Every day | ORAL | Status: DC

## 2017-06-26 MED ORDER — LIDOCAINE 2% (20 MG/ML) 5 ML SYRINGE
INTRAMUSCULAR | Status: AC
  Filled 2017-06-26: qty 5

## 2017-06-26 MED ORDER — STERILE WATER FOR IRRIGATION IR SOLN
Status: DC | PRN
  Administered 2017-06-26: 3000 mL

## 2017-06-26 MED ORDER — PSEUDOEPHEDRINE-GUAIFENESIN ER 60-600 MG PO TB12: 1.0000 | ORAL_TABLET | Freq: Every evening | ORAL | Status: DC | PRN

## 2017-06-26 MED ORDER — FENTANYL CITRATE (PF) 100 MCG/2ML IJ SOLN
INTRAMUSCULAR | Status: DC | PRN
  Administered 2017-06-26: 50 ug via INTRAVENOUS
  Administered 2017-06-26: 25 ug via INTRAVENOUS

## 2017-06-26 MED ORDER — SULFAMETHOXAZOLE-TRIMETHOPRIM 800-160 MG PO TABS: 1.0000 | ORAL_TABLET | Freq: Two times a day (BID) | ORAL | 0 refills | Status: DC

## 2017-06-26 MED ORDER — EPHEDRINE SULFATE 50 MG/ML IJ SOLN
INTRAMUSCULAR | Status: DC | PRN
  Administered 2017-06-26 (×2): 5 mg via INTRAVENOUS
  Administered 2017-06-26: 10 mg via INTRAVENOUS
  Administered 2017-06-26: 5 mg via INTRAVENOUS
  Administered 2017-06-26 (×3): 10 mg via INTRAVENOUS

## 2017-06-26 MED ORDER — SULFAMETHOXAZOLE-TRIMETHOPRIM 800-160 MG PO TABS
1.0000 | ORAL_TABLET | Freq: Two times a day (BID) | ORAL | Status: DC
  Administered 2017-06-26 – 2017-06-27 (×2): 1 via ORAL
  Filled 2017-06-26 (×2): qty 1

## 2017-06-26 MED ORDER — LISINOPRIL 10 MG PO TABS
10.0000 mg | ORAL_TABLET | Freq: Every day | ORAL | Status: DC
  Administered 2017-06-27: 10 mg via ORAL
  Filled 2017-06-26: qty 1

## 2017-06-26 MED ORDER — LISINOPRIL-HYDROCHLOROTHIAZIDE 10-12.5 MG PO TABS: 1.0000 | ORAL_TABLET | Freq: Every day | ORAL | Status: DC

## 2017-06-26 MED ORDER — SODIUM CHLORIDE 0.9 % IR SOLN
Status: DC | PRN
  Administered 2017-06-26: 3000 mL

## 2017-06-26 MED ORDER — SODIUM CHLORIDE 0.9% FLUSH: 3.0000 mL | Freq: Two times a day (BID) | INTRAVENOUS | Status: DC

## 2017-06-26 MED ORDER — DEXAMETHASONE SODIUM PHOSPHATE 10 MG/ML IJ SOLN
INTRAMUSCULAR | Status: AC
  Filled 2017-06-26: qty 1

## 2017-06-26 MED ORDER — SIMVASTATIN 20 MG PO TABS
20.0000 mg | ORAL_TABLET | Freq: Every day | ORAL | Status: DC
  Administered 2017-06-27: 20 mg via ORAL
  Filled 2017-06-26: qty 1

## 2017-06-26 MED ORDER — DONEPEZIL HCL 5 MG PO TABS
5.0000 mg | ORAL_TABLET | Freq: Every day | ORAL | Status: DC
  Administered 2017-06-26: 5 mg via ORAL
  Filled 2017-06-26: qty 1

## 2017-06-26 MED ORDER — HYDROCHLOROTHIAZIDE 12.5 MG PO CAPS
12.5000 mg | ORAL_CAPSULE | Freq: Every day | ORAL | Status: DC
  Administered 2017-06-27: 12.5 mg via ORAL
  Filled 2017-06-26: qty 1

## 2017-06-26 MED ORDER — CEFTRIAXONE SODIUM 2 G IJ SOLR
INTRAMUSCULAR | Status: AC
  Filled 2017-06-26: qty 2

## 2017-06-26 MED ORDER — SODIUM CHLORIDE 0.9% FLUSH: 3.0000 mL | INTRAVENOUS | Status: DC | PRN

## 2017-06-26 MED ORDER — SODIUM CHLORIDE 0.9 % IV SOLN: 250.0000 mL | INTRAVENOUS | Status: DC | PRN

## 2017-06-26 MED ORDER — FENTANYL CITRATE (PF) 100 MCG/2ML IJ SOLN: 25.0000 ug | INTRAMUSCULAR | Status: DC | PRN

## 2017-06-26 MED ORDER — DEXTROSE 5 % IV SOLN
2.0000 g | Freq: Once | INTRAVENOUS | Status: AC
  Administered 2017-06-26: 2 g via INTRAVENOUS

## 2017-06-26 MED ORDER — PROPOFOL 10 MG/ML IV BOLUS
INTRAVENOUS | Status: DC | PRN
  Administered 2017-06-26 (×2): 50 mg via INTRAVENOUS
  Administered 2017-06-26 (×2): 20 mg via INTRAVENOUS

## 2017-06-26 MED ORDER — LIDOCAINE HCL 1 % IJ SOLN
INTRAMUSCULAR | Status: DC | PRN
  Administered 2017-06-26: 60 mg via INTRADERMAL

## 2017-06-26 MED ORDER — DIPHENHYDRAMINE HCL 12.5 MG/5ML PO ELIX: 12.5000 mg | ORAL_SOLUTION | Freq: Four times a day (QID) | ORAL | Status: DC | PRN

## 2017-06-26 MED ORDER — ACETAMINOPHEN 500 MG PO TABS: 500.0000 mg | ORAL_TABLET | Freq: Four times a day (QID) | ORAL | Status: DC | PRN

## 2017-06-26 SURGICAL SUPPLY — 13 items
BAG URO CATCHER STRL LF (MISCELLANEOUS) ×3 IMPLANT
CATH INTERMIT  6FR 70CM (CATHETERS) ×3 IMPLANT
CLOTH BEACON ORANGE TIMEOUT ST (SAFETY) ×3 IMPLANT
COVER FOOTSWITCH UNIV (MISCELLANEOUS) ×2 IMPLANT
COVER SURGICAL LIGHT HANDLE (MISCELLANEOUS) ×3 IMPLANT
GLOVE BIOGEL M STRL SZ7.5 (GLOVE) ×3 IMPLANT
GOWN STRL REUS W/TWL LRG LVL3 (GOWN DISPOSABLE) ×6 IMPLANT
GUIDEWIRE STR DUAL SENSOR (WIRE) ×3 IMPLANT
MANIFOLD NEPTUNE II (INSTRUMENTS) ×3 IMPLANT
PACK CYSTO (CUSTOM PROCEDURE TRAY) ×3 IMPLANT
STENT URO INLAY 6FRX24CM (STENTS) ×2 IMPLANT
TUBING CONNECTING 10 (TUBING) ×2 IMPLANT
TUBING CONNECTING 10' (TUBING) ×1

## 2017-06-26 NOTE — Op Note (Signed)
Preoperative diagnosis:  1. Right ureteral obstruction 2. Bladder cancer   Postoperative diagnosis:  1. Right ureteral obstruction 2. Bladder cancer   Procedure:  1. Cystoscopy 2. Right ureteral stent placement (6 x 24 Bard Inlay Optima)  Surgeon: Roxy Horseman, Brooke Bonito. M.D.  Anesthesia: General  Complications: None  Intraoperative findings: His indwelling stent was minimally encrusted.  EBL: Minimal  Specimens: None  Indication: Brian Montgomery is a 81 y.o. patient with right ureteral obstruction thought to be related to radiation therapy for bladder and prostate cancer. After reviewing the management options for treatment, he elected to proceed with the above surgical procedure(s). We have discussed the potential benefits and risks of the procedure, side effects of the proposed treatment, the likelihood of the patient achieving the goals of the procedure, and any potential problems that might occur during the procedure or recuperation. Informed consent has been obtained.  Description of procedure:  The patient was taken to the operating room and general anesthesia was induced.  The patient was placed in the dorsal lithotomy position, prepped and draped in the usual sterile fashion, and preoperative antibiotics were administered. A preoperative time-out was performed.   Cystourethroscopy was performed.  The patient's urethra was examined.  It was fixed posteriorly in the prostatic urethra and bladder neck.  The bladder was then systematically examined in its entirety. There was no evidence for any bladder tumors, stones, or other mucosal pathology.    Attention then turned to the right ureteral orifice and the patient's indwelling ureteral stent was identified and brought out to the urethral meatus with the flexible graspers.  A 0.38 sensor guidewire was then advanced up the right ureter into the renal pelvis under fluoroscopic guidance.  The wire was then backloaded through the  cystoscope and a ureteral stent was advance over the wire using Seldinger technique.  The stent was positioned appropriately under fluoroscopic and cystoscopic guidance.  The wire was then removed with an adequate stent curl noted in the renal pelvis as well as in the bladder.  The bladder was then emptied and the procedure ended.  The patient appeared to tolerate the procedure well and without complications.  The patient was able to be awakened and transferred to the recovery unit in satisfactory condition.    Pryor Curia MD

## 2017-06-26 NOTE — Anesthesia Preprocedure Evaluation (Addendum)
Anesthesia Evaluation  Patient identified by MRN, date of birth, ID band Patient awake    Reviewed: Allergy & Precautions, NPO status , Patient's Chart, lab work & pertinent test results  Airway Mallampati: II  TM Distance: >3 FB Neck ROM: Full    Dental  (+) Edentulous Upper, Dental Advisory Given   Pulmonary former smoker,    breath sounds clear to auscultation       Cardiovascular hypertension, Pt. on medications  Rhythm:Regular Rate:Normal  ECG: ST, rate 103   Neuro/Psych Parkinson disease  Dementia negative psych ROS   GI/Hepatic negative GI ROS, Neg liver ROS,   Endo/Other  negative endocrine ROS  Renal/GU Renal disease   RIGHT URETERAL OBSTRUCTION BLADDER CANCER    Musculoskeletal   Abdominal   Peds  Hematology  (+) anemia , Dyslipidemia   Anesthesia Other Findings Wheelchair bound  Reproductive/Obstetrics                            Anesthesia Physical  Anesthesia Plan  ASA: III  Anesthesia Plan: General   Post-op Pain Management:    Induction: Intravenous  PONV Risk Score and Plan: 2 and Dexamethasone, Ondansetron and Treatment may vary due to age or medical condition  Airway Management Planned: LMA  Additional Equipment:   Intra-op Plan:   Post-operative Plan: Extubation in OR  Informed Consent: I have reviewed the patients History and Physical, chart, labs and discussed the procedure including the risks, benefits and alternatives for the proposed anesthesia with the patient or authorized representative who has indicated his/her understanding and acceptance.   Dental advisory given  Plan Discussed with: CRNA  Anesthesia Plan Comments:         Anesthesia Quick Evaluation

## 2017-06-26 NOTE — Telephone Encounter (Signed)
Amy notified. Victorino December

## 2017-06-26 NOTE — Discharge Instructions (Addendum)

## 2017-06-26 NOTE — Progress Notes (Signed)
Called report to [facility] [place]. Transferred to [id number].

## 2017-06-26 NOTE — Telephone Encounter (Signed)
[facility] would like to continue PT with him if that is ok, she states that he has done very well with PT, she can be reached at [phone] with the verbal order

## 2017-06-26 NOTE — Anesthesia Procedure Notes (Signed)
Procedure Name: LMA Insertion Date/Time: 06/26/2017 10:55 AM Performed by: Garrel Ridgel, CRNA Pre-anesthesia Checklist: Patient identified, Emergency Drugs available, Suction available and Patient being monitored Patient Re-evaluated:Patient Re-evaluated prior to induction Oxygen Delivery Method: Circle system utilized Preoxygenation: Pre-oxygenation with 100% oxygen Induction Type: IV induction LMA: LMA inserted LMA Size: 4.0 Number of attempts: 1 Placement Confirmation: positive ETCO2 and breath sounds checked- equal and bilateral Tube secured with: Tape Dental Injury: Teeth and Oropharynx as per pre-operative assessment

## 2017-06-26 NOTE — Telephone Encounter (Signed)
ok 

## 2017-06-26 NOTE — Progress Notes (Signed)
PHARMACIST - PHYSICIAN ORDER COMMUNICATION  CONCERNING: P&T Medication Policy on Herbal Medications  DESCRIPTION:  This patient's order for:  melatonin  has been noted.  This product(s) is classified as an "herbal" or natural product. Due to a lack of definitive safety studies or FDA approval, nonstandard manufacturing practices, plus the potential risk of unknown drug-drug interactions while on inpatient medications, the Pharmacy and Therapeutics Committee does not permit the use of "herbal" or natural products of this type within [facility]   ACTION TAKEN: The pharmacy department is unable to verify this order at this time and your patient has been informed of this safety policy. Please reevaluate patient's clinical condition at discharge and address if the herbal or natural product(s) should be resumed at that time.   Doreene Eland, PharmD, BCPS.   06/26/2017 5:45 PM

## 2017-06-26 NOTE — Anesthesia Postprocedure Evaluation (Signed)
Anesthesia Post Note  Patient: Brian Montgomery  Procedure(s) Performed: CYSTOSCOPY WITH STENT CHANGE (Right )     Patient location during evaluation: PACU Anesthesia Type: General Level of consciousness: awake and alert Pain management: pain level controlled Vital Signs Assessment: post-procedure vital signs reviewed and stable Respiratory status: spontaneous breathing, nonlabored ventilation, respiratory function stable and patient connected to nasal cannula oxygen Cardiovascular status: blood pressure returned to baseline and stable Postop Assessment: no apparent nausea or vomiting Anesthetic complications: no    Last Vitals:  Vitals:   06/26/17 1300 06/26/17 1415  BP: 112/72 (!) 100/46  Pulse: 80 94  Resp:  16  Temp: (!) 36.3 C (!) 36.4 C  SpO2: 95% 99%    Last Pain:  Vitals:   06/26/17 1415  TempSrc: Oral  PainSc:                  Ryan P Ellender

## 2017-06-26 NOTE — Transfer of Care (Signed)
Immediate Anesthesia Transfer of Care Note  Patient: Brian Montgomery  Procedure(s) Performed: CYSTOSCOPY WITH STENT CHANGE (Right )  Patient Location: PACU  Anesthesia Type:General  Level of Consciousness: awake, alert  and oriented  Airway & Oxygen Therapy: Patient Spontanous Breathing and Patient connected to face mask oxygen  Post-op Assessment: Report given to RN and Post -op Vital signs reviewed and stable  Post vital signs: Reviewed  Last Vitals:  Vitals:   06/26/17 0808  BP: 97/69  Pulse: 75  Resp: 16  Temp: 36.6 C  SpO2: 100%    Last Pain:  Vitals:   06/26/17 0808  TempSrc: Oral         Complications: No apparent anesthesia complications

## 2017-06-27 ENCOUNTER — Encounter (HOSPITAL_COMMUNITY): Payer: Self-pay | Admitting: Urology

## 2017-06-27 DIAGNOSIS — C679 Malignant neoplasm of bladder, unspecified: Secondary | ICD-10-CM | POA: Diagnosis not present

## 2017-06-27 DIAGNOSIS — F0390 Unspecified dementia without behavioral disturbance: Secondary | ICD-10-CM | POA: Diagnosis not present

## 2017-06-27 DIAGNOSIS — G20 Parkinson's disease: Secondary | ICD-10-CM | POA: Diagnosis not present

## 2017-06-27 DIAGNOSIS — I10 Essential (primary) hypertension: Secondary | ICD-10-CM | POA: Diagnosis not present

## 2017-06-27 DIAGNOSIS — N131 Hydronephrosis with ureteral stricture, not elsewhere classified: Secondary | ICD-10-CM | POA: Diagnosis not present

## 2017-06-27 DIAGNOSIS — C61 Malignant neoplasm of prostate: Secondary | ICD-10-CM | POA: Diagnosis not present

## 2017-06-27 NOTE — Discharge Summary (Signed)
  Date of admission: 06/26/2017  Date of discharge: 06/27/2017  Admission diagnosis: Right ureteral obstruction  Discharge diagnosis: Right ureteral obstruction  Secondary diagnoses: Bladder cancer, prostate cancer, hypertension, dementia, Parkinson's disease  History and Physical: For full details, please see admission history and physical. Briefly, Brian Montgomery is a 81 y.o. year old patient with bladder cancer and prostate cancer s/p radiation therapy.  He has developed resultant right ureteral obstruction related to his radiation therapy which had been managed with ureteral stent placement.   Hospital Course: He presented on 06/26/17 for right ureteral stent change.  This was performed without complications.  He did have some initial hypotension upon induction of anesthesia but remained stable during and after his procedure.  Due to his frailty, he was admitted overnight for observation.  He remained stable and was voiding without difficulty and was discharged home on POD # 1.  Disposition: Home  Discharge instruction: The patient was instructed to be ambulatory but told to refrain from heavy lifting, strenuous activity, or driving.   Discharge medications:  Allergies as of 06/27/2017   No Known Allergies     Medication List    STOP taking these medications   benzonatate 100 MG capsule Commonly known as:  TESSALON   Foam Cushion Misc   HYDROcodone-acetaminophen 5-325 MG tablet Commonly known as:  NORCO/VICODIN   loperamide 2 MG capsule Commonly known as:  IMODIUM     TAKE these medications   acetaminophen 500 MG tablet Commonly known as:  TYLENOL Take 500 mg by mouth every 6 (six) hours as needed for moderate pain or headache.   AZO TABS PO Take 1 tablet by mouth as needed (for urinary pain). What changed:  Another medication with the same name was removed. Continue taking this medication, and follow the directions you see here.   carbidopa-levodopa 25-100 MG  tablet Commonly known as:  SINEMET IR Take 1.5 tablets by mouth 3 (three) times daily.   citalopram 10 MG tablet Commonly known as:  CELEXA TAKE 1 TABLET(10 MG) BY MOUTH DAILY   donepezil 10 MG tablet Commonly known as:  ARICEPT TAKE 1 TABLET(10 MG) BY MOUTH AT BEDTIME   DRY EYES OP Place 1 drop into both eyes as needed (for dry eyes).   lisinopril-hydrochlorothiazide 10-12.5 MG tablet Commonly known as:  PRINZIDE,ZESTORETIC TAKE 1 TABLET BY MOUTH DAILY   Melatonin 3 MG Caps Take 3 mg by mouth at bedtime.   pseudoephedrine-guaifenesin 60-600 MG 12 hr tablet Commonly known as:  MUCINEX D Take 1 tablet by mouth at bedtime as needed for congestion.   simvastatin 20 MG tablet Commonly known as:  ZOCOR TAKE 1 TABLET BY MOUTH DAILY What changed:    how much to take  how to take this  when to take this   sulfamethoxazole-trimethoprim 800-160 MG tablet Commonly known as:  BACTRIM DS,SEPTRA DS Take 1 tablet by mouth 2 (two) times daily.       Followup:  Follow-up Information    Raynelle Bring, MD.   Specialty:  Urology Why:  9:15 AM on 08/29/17 Contact information: [place] [phone]

## 2017-06-27 NOTE — Progress Notes (Signed)
Patient ID: Brian Montgomery, male   DOB: 09/29/1935, 81 y.o.   MRN: [id number]    1 Day Post-Op Subjective: Pt voiding ok without complaints.  Objective: Vital signs in last 24 hours: Temp:  [97.4 F (36.3 C)-98.1 F (36.7 C)] 98.1 F (36.7 C) (12/18 0556) Pulse Rate:  [75-104] 75 (12/18 0556) Resp:  [15-22] 20 (12/18 0556) BP: (97-123)/(46-72) 123/70 (12/18 0556) SpO2:  [95 %-100 %] 97 % (12/18 0556) Weight:  [67.8 kg (149 lb 7.6 oz)-67.8 kg (149 lb 8 oz)] 67.8 kg (149 lb 7.6 oz) (12/17 1742)  Intake/Output from previous day: 12/17 0701 - 12/18 0700 In: 1797.8 [P.O.:444; I.V.:1353.8] Out: 402 [Urine:400; Blood:2] Intake/Output this shift: No intake/output data recorded.  Physical Exam:  General: Alert and awake Abdomen: Soft, ND  Assessment/Plan: S/P right ureteral stent change - D/C home   LOS: 0 days   Ladajah Soltys,LES 06/27/2017, 7:18 AM

## 2017-06-27 NOTE — Care Management Obs Status (Signed)
[facility] NOTIFICATION   Patient Details  Name: Marshell Dilauro MRN: [id number] Date of Birth: 19-May-1936   Medicare Observation Status Notification Given:  Yes    McGibboneyOletta Darter, RN 06/27/2017, 11:29 AM

## 2017-06-28 DIAGNOSIS — G609 Hereditary and idiopathic neuropathy, unspecified: Secondary | ICD-10-CM | POA: Diagnosis not present

## 2017-06-28 DIAGNOSIS — E785 Hyperlipidemia, unspecified: Secondary | ICD-10-CM | POA: Diagnosis not present

## 2017-06-28 DIAGNOSIS — G231 Progressive supranuclear ophthalmoplegia [Steele-Richardson-Olszewski]: Secondary | ICD-10-CM | POA: Diagnosis not present

## 2017-06-28 DIAGNOSIS — N183 Chronic kidney disease, stage 3 (moderate): Secondary | ICD-10-CM | POA: Diagnosis not present

## 2017-06-28 DIAGNOSIS — I129 Hypertensive chronic kidney disease with stage 1 through stage 4 chronic kidney disease, or unspecified chronic kidney disease: Secondary | ICD-10-CM | POA: Diagnosis not present

## 2017-06-28 DIAGNOSIS — F0390 Unspecified dementia without behavioral disturbance: Secondary | ICD-10-CM | POA: Diagnosis not present

## 2017-06-30 DIAGNOSIS — N183 Chronic kidney disease, stage 3 (moderate): Secondary | ICD-10-CM | POA: Diagnosis not present

## 2017-06-30 DIAGNOSIS — G231 Progressive supranuclear ophthalmoplegia [Steele-Richardson-Olszewski]: Secondary | ICD-10-CM | POA: Diagnosis not present

## 2017-06-30 DIAGNOSIS — I129 Hypertensive chronic kidney disease with stage 1 through stage 4 chronic kidney disease, or unspecified chronic kidney disease: Secondary | ICD-10-CM | POA: Diagnosis not present

## 2017-06-30 DIAGNOSIS — G609 Hereditary and idiopathic neuropathy, unspecified: Secondary | ICD-10-CM | POA: Diagnosis not present

## 2017-06-30 DIAGNOSIS — F0390 Unspecified dementia without behavioral disturbance: Secondary | ICD-10-CM | POA: Diagnosis not present

## 2017-06-30 DIAGNOSIS — E785 Hyperlipidemia, unspecified: Secondary | ICD-10-CM | POA: Diagnosis not present

## 2017-07-05 ENCOUNTER — Telehealth: Payer: Self-pay | Admitting: Neurology

## 2017-07-05 DIAGNOSIS — E785 Hyperlipidemia, unspecified: Secondary | ICD-10-CM | POA: Diagnosis not present

## 2017-07-05 DIAGNOSIS — I129 Hypertensive chronic kidney disease with stage 1 through stage 4 chronic kidney disease, or unspecified chronic kidney disease: Secondary | ICD-10-CM | POA: Diagnosis not present

## 2017-07-05 DIAGNOSIS — G231 Progressive supranuclear ophthalmoplegia [Steele-Richardson-Olszewski]: Secondary | ICD-10-CM | POA: Diagnosis not present

## 2017-07-05 DIAGNOSIS — N183 Chronic kidney disease, stage 3 (moderate): Secondary | ICD-10-CM | POA: Diagnosis not present

## 2017-07-05 DIAGNOSIS — G609 Hereditary and idiopathic neuropathy, unspecified: Secondary | ICD-10-CM | POA: Diagnosis not present

## 2017-07-05 DIAGNOSIS — F0390 Unspecified dementia without behavioral disturbance: Secondary | ICD-10-CM | POA: Diagnosis not present

## 2017-07-05 NOTE — Telephone Encounter (Signed)
Patient has been taking Melatonin to help him sleep but may need something stronger. Also, had other questions please call. Thanks

## 2017-07-06 DIAGNOSIS — G231 Progressive supranuclear ophthalmoplegia [Steele-Richardson-Olszewski]: Secondary | ICD-10-CM | POA: Diagnosis not present

## 2017-07-06 DIAGNOSIS — N183 Chronic kidney disease, stage 3 (moderate): Secondary | ICD-10-CM | POA: Diagnosis not present

## 2017-07-06 DIAGNOSIS — I129 Hypertensive chronic kidney disease with stage 1 through stage 4 chronic kidney disease, or unspecified chronic kidney disease: Secondary | ICD-10-CM | POA: Diagnosis not present

## 2017-07-06 DIAGNOSIS — F0390 Unspecified dementia without behavioral disturbance: Secondary | ICD-10-CM | POA: Diagnosis not present

## 2017-07-06 DIAGNOSIS — E785 Hyperlipidemia, unspecified: Secondary | ICD-10-CM | POA: Diagnosis not present

## 2017-07-06 DIAGNOSIS — G609 Hereditary and idiopathic neuropathy, unspecified: Secondary | ICD-10-CM | POA: Diagnosis not present

## 2017-07-06 NOTE — Telephone Encounter (Signed)
Brian Montgomery with [facility] called. She actually got the message about the Melatonin. She asked if could have an order for additional home health. I asked that she have a request faxed.

## 2017-07-06 NOTE — Telephone Encounter (Signed)
Called and spoke with Brian Montgomery, Pts wife.Since Pt was released from the nursing home, she has been giving Pt Melatonin 3mg , 2QHS, which initially helped Pt not only to fall asleep, but kept him from waking during the night and roaming throughout the house, saying he's getting ready for work, or wanting to go outside at 3am to burn leaves. She wants to know if there is something stronger she can give him or if it is safe to further increase the Melatonin

## 2017-07-06 NOTE — Telephone Encounter (Signed)
She can increase dose of melatonin up to 10mg  at bedtime.

## 2017-07-06 NOTE — Telephone Encounter (Signed)
Called and LM on VM for Brian Montgomery, advising ok to take up to 10mg  of Melatonin, and to call with any questions or further concerns

## 2017-07-12 ENCOUNTER — Telehealth: Payer: Self-pay | Admitting: Family Medicine

## 2017-07-12 ENCOUNTER — Other Ambulatory Visit: Payer: Self-pay | Admitting: Family Medicine

## 2017-07-12 DIAGNOSIS — N183 Chronic kidney disease, stage 3 (moderate): Secondary | ICD-10-CM | POA: Diagnosis not present

## 2017-07-12 DIAGNOSIS — E785 Hyperlipidemia, unspecified: Secondary | ICD-10-CM | POA: Diagnosis not present

## 2017-07-12 DIAGNOSIS — G609 Hereditary and idiopathic neuropathy, unspecified: Secondary | ICD-10-CM | POA: Diagnosis not present

## 2017-07-12 DIAGNOSIS — G231 Progressive supranuclear ophthalmoplegia [Steele-Richardson-Olszewski]: Secondary | ICD-10-CM | POA: Diagnosis not present

## 2017-07-12 DIAGNOSIS — I129 Hypertensive chronic kidney disease with stage 1 through stage 4 chronic kidney disease, or unspecified chronic kidney disease: Secondary | ICD-10-CM | POA: Diagnosis not present

## 2017-07-12 DIAGNOSIS — F0390 Unspecified dementia without behavioral disturbance: Secondary | ICD-10-CM | POA: Diagnosis not present

## 2017-07-12 MED ORDER — LISINOPRIL-HYDROCHLOROTHIAZIDE 10-12.5 MG PO TABS: 1.0000 | ORAL_TABLET | Freq: Every day | ORAL | 1 refills | Status: DC

## 2017-07-12 NOTE — Telephone Encounter (Signed)
Called pt t/w wife she states she will call back next week to schedule appt.

## 2017-07-14 ENCOUNTER — Telehealth: Payer: Self-pay | Admitting: Family Medicine

## 2017-07-14 DIAGNOSIS — E785 Hyperlipidemia, unspecified: Secondary | ICD-10-CM | POA: Diagnosis not present

## 2017-07-14 DIAGNOSIS — I129 Hypertensive chronic kidney disease with stage 1 through stage 4 chronic kidney disease, or unspecified chronic kidney disease: Secondary | ICD-10-CM | POA: Diagnosis not present

## 2017-07-14 DIAGNOSIS — G231 Progressive supranuclear ophthalmoplegia [Steele-Richardson-Olszewski]: Secondary | ICD-10-CM | POA: Diagnosis not present

## 2017-07-14 DIAGNOSIS — G609 Hereditary and idiopathic neuropathy, unspecified: Secondary | ICD-10-CM | POA: Diagnosis not present

## 2017-07-14 DIAGNOSIS — N183 Chronic kidney disease, stage 3 (moderate): Secondary | ICD-10-CM | POA: Diagnosis not present

## 2017-07-14 DIAGNOSIS — F0390 Unspecified dementia without behavioral disturbance: Secondary | ICD-10-CM | POA: Diagnosis not present

## 2017-07-14 NOTE — Telephone Encounter (Signed)
[facility] with [facility] called and stated that pt fell out of bed but did not get any injuries. She just wanted [place] to be aware.   ALSO she is requesting additional home health visits to assist pt with bathing. She can be reached at [phone]. [phone].

## 2017-07-14 NOTE — Telephone Encounter (Signed)
ok 

## 2017-07-17 ENCOUNTER — Telehealth: Payer: Self-pay | Admitting: Family Medicine

## 2017-07-17 MED ORDER — CITALOPRAM HYDROBROMIDE 20 MG PO TABS: 20.0000 mg | ORAL_TABLET | Freq: Every day | ORAL | 3 refills | Status: DC

## 2017-07-17 NOTE — Telephone Encounter (Signed)
Spoke with Brian Montgomery- she was advised of increase in Celexa. Brian Montgomery

## 2017-07-17 NOTE — Telephone Encounter (Signed)
LM on Brian Montgomery's VCM verbal ok. Victorino December

## 2017-07-17 NOTE — Telephone Encounter (Signed)
Let them know that I would rather go with a higher dose of Celexa rather than adding medicine.

## 2017-07-17 NOTE — Telephone Encounter (Signed)
Pt's daughter called requesting med to help calm pt down. She said he seems to get anxiety, really fidgety in the evening and night. Pt's wife says this seem to happen as soon as the sun goes down and it gets dark. It has been difficult to keep him in bed sometime and he has fallen out of bed a few times while attempting to get out of bed.  Daught and wife will be bringing pt in for a face to face appt on 08/10/17

## 2017-07-18 DIAGNOSIS — G231 Progressive supranuclear ophthalmoplegia [Steele-Richardson-Olszewski]: Secondary | ICD-10-CM | POA: Diagnosis not present

## 2017-07-18 DIAGNOSIS — I129 Hypertensive chronic kidney disease with stage 1 through stage 4 chronic kidney disease, or unspecified chronic kidney disease: Secondary | ICD-10-CM | POA: Diagnosis not present

## 2017-07-18 DIAGNOSIS — G609 Hereditary and idiopathic neuropathy, unspecified: Secondary | ICD-10-CM | POA: Diagnosis not present

## 2017-07-18 DIAGNOSIS — E785 Hyperlipidemia, unspecified: Secondary | ICD-10-CM | POA: Diagnosis not present

## 2017-07-18 DIAGNOSIS — F0390 Unspecified dementia without behavioral disturbance: Secondary | ICD-10-CM | POA: Diagnosis not present

## 2017-07-18 DIAGNOSIS — N183 Chronic kidney disease, stage 3 (moderate): Secondary | ICD-10-CM | POA: Diagnosis not present

## 2017-07-19 DIAGNOSIS — E785 Hyperlipidemia, unspecified: Secondary | ICD-10-CM | POA: Diagnosis not present

## 2017-07-19 DIAGNOSIS — G609 Hereditary and idiopathic neuropathy, unspecified: Secondary | ICD-10-CM | POA: Diagnosis not present

## 2017-07-19 DIAGNOSIS — F0390 Unspecified dementia without behavioral disturbance: Secondary | ICD-10-CM | POA: Diagnosis not present

## 2017-07-19 DIAGNOSIS — N183 Chronic kidney disease, stage 3 (moderate): Secondary | ICD-10-CM | POA: Diagnosis not present

## 2017-07-19 DIAGNOSIS — G231 Progressive supranuclear ophthalmoplegia [Steele-Richardson-Olszewski]: Secondary | ICD-10-CM | POA: Diagnosis not present

## 2017-07-19 DIAGNOSIS — I129 Hypertensive chronic kidney disease with stage 1 through stage 4 chronic kidney disease, or unspecified chronic kidney disease: Secondary | ICD-10-CM | POA: Diagnosis not present

## 2017-07-20 ENCOUNTER — Telehealth: Payer: Self-pay | Admitting: Family Medicine

## 2017-07-20 DIAGNOSIS — G609 Hereditary and idiopathic neuropathy, unspecified: Secondary | ICD-10-CM | POA: Diagnosis not present

## 2017-07-20 DIAGNOSIS — I129 Hypertensive chronic kidney disease with stage 1 through stage 4 chronic kidney disease, or unspecified chronic kidney disease: Secondary | ICD-10-CM | POA: Diagnosis not present

## 2017-07-20 DIAGNOSIS — F0390 Unspecified dementia without behavioral disturbance: Secondary | ICD-10-CM | POA: Diagnosis not present

## 2017-07-20 DIAGNOSIS — E785 Hyperlipidemia, unspecified: Secondary | ICD-10-CM | POA: Diagnosis not present

## 2017-07-20 DIAGNOSIS — G231 Progressive supranuclear ophthalmoplegia [Steele-Richardson-Olszewski]: Secondary | ICD-10-CM | POA: Diagnosis not present

## 2017-07-20 DIAGNOSIS — N183 Chronic kidney disease, stage 3 (moderate): Secondary | ICD-10-CM | POA: Diagnosis not present

## 2017-07-20 NOTE — Telephone Encounter (Signed)
Brian Montgomery with [facility] called. Mr. Brian Montgomery is coming to the end of his PT on 07/24/2017. She is requesting 3 additional weeks of PT to work on his stand and balance issues. Please call her at [phone] to advise.

## 2017-07-20 NOTE — Telephone Encounter (Signed)
ok 

## 2017-07-21 DIAGNOSIS — G231 Progressive supranuclear ophthalmoplegia [Steele-Richardson-Olszewski]: Secondary | ICD-10-CM | POA: Diagnosis not present

## 2017-07-21 DIAGNOSIS — N183 Chronic kidney disease, stage 3 (moderate): Secondary | ICD-10-CM | POA: Diagnosis not present

## 2017-07-21 DIAGNOSIS — G609 Hereditary and idiopathic neuropathy, unspecified: Secondary | ICD-10-CM | POA: Diagnosis not present

## 2017-07-21 DIAGNOSIS — F0390 Unspecified dementia without behavioral disturbance: Secondary | ICD-10-CM | POA: Diagnosis not present

## 2017-07-21 DIAGNOSIS — I129 Hypertensive chronic kidney disease with stage 1 through stage 4 chronic kidney disease, or unspecified chronic kidney disease: Secondary | ICD-10-CM | POA: Diagnosis not present

## 2017-07-21 DIAGNOSIS — E785 Hyperlipidemia, unspecified: Secondary | ICD-10-CM | POA: Diagnosis not present

## 2017-07-21 NOTE — Telephone Encounter (Signed)
Left message with verbal ok per Dr. Redmond School

## 2017-07-24 ENCOUNTER — Telehealth: Payer: Self-pay | Admitting: Family Medicine

## 2017-07-24 DIAGNOSIS — G609 Hereditary and idiopathic neuropathy, unspecified: Secondary | ICD-10-CM | POA: Diagnosis not present

## 2017-07-24 DIAGNOSIS — G231 Progressive supranuclear ophthalmoplegia [Steele-Richardson-Olszewski]: Secondary | ICD-10-CM | POA: Diagnosis not present

## 2017-07-24 DIAGNOSIS — E785 Hyperlipidemia, unspecified: Secondary | ICD-10-CM | POA: Diagnosis not present

## 2017-07-24 DIAGNOSIS — F0390 Unspecified dementia without behavioral disturbance: Secondary | ICD-10-CM | POA: Diagnosis not present

## 2017-07-24 DIAGNOSIS — I129 Hypertensive chronic kidney disease with stage 1 through stage 4 chronic kidney disease, or unspecified chronic kidney disease: Secondary | ICD-10-CM | POA: Diagnosis not present

## 2017-07-24 DIAGNOSIS — N183 Chronic kidney disease, stage 3 (moderate): Secondary | ICD-10-CM | POA: Diagnosis not present

## 2017-07-24 NOTE — Telephone Encounter (Signed)
Amy advised.  

## 2017-07-24 NOTE — Telephone Encounter (Signed)
Ok on everything

## 2017-07-24 NOTE — Telephone Encounter (Signed)
Brian Montgomery with [facility] called and left message that patient needs hospital bed as he has fallen out of bed and is very active at night time. Also are you ok with extending PT and home health for 3 more weeks.  Please advise [phone]

## 2017-07-25 DIAGNOSIS — E785 Hyperlipidemia, unspecified: Secondary | ICD-10-CM | POA: Diagnosis not present

## 2017-07-25 DIAGNOSIS — G231 Progressive supranuclear ophthalmoplegia [Steele-Richardson-Olszewski]: Secondary | ICD-10-CM | POA: Diagnosis not present

## 2017-07-25 DIAGNOSIS — F0390 Unspecified dementia without behavioral disturbance: Secondary | ICD-10-CM | POA: Diagnosis not present

## 2017-07-25 DIAGNOSIS — Z8546 Personal history of malignant neoplasm of prostate: Secondary | ICD-10-CM | POA: Diagnosis not present

## 2017-07-25 DIAGNOSIS — G609 Hereditary and idiopathic neuropathy, unspecified: Secondary | ICD-10-CM | POA: Diagnosis not present

## 2017-07-25 DIAGNOSIS — Z87891 Personal history of nicotine dependence: Secondary | ICD-10-CM | POA: Diagnosis not present

## 2017-07-25 DIAGNOSIS — Z8781 Personal history of (healed) traumatic fracture: Secondary | ICD-10-CM | POA: Diagnosis not present

## 2017-07-25 DIAGNOSIS — Z8551 Personal history of malignant neoplasm of bladder: Secondary | ICD-10-CM | POA: Diagnosis not present

## 2017-07-25 DIAGNOSIS — N183 Chronic kidney disease, stage 3 (moderate): Secondary | ICD-10-CM | POA: Diagnosis not present

## 2017-07-25 DIAGNOSIS — I129 Hypertensive chronic kidney disease with stage 1 through stage 4 chronic kidney disease, or unspecified chronic kidney disease: Secondary | ICD-10-CM | POA: Diagnosis not present

## 2017-07-28 DIAGNOSIS — N183 Chronic kidney disease, stage 3 (moderate): Secondary | ICD-10-CM | POA: Diagnosis not present

## 2017-07-28 DIAGNOSIS — I129 Hypertensive chronic kidney disease with stage 1 through stage 4 chronic kidney disease, or unspecified chronic kidney disease: Secondary | ICD-10-CM | POA: Diagnosis not present

## 2017-07-28 DIAGNOSIS — G231 Progressive supranuclear ophthalmoplegia [Steele-Richardson-Olszewski]: Secondary | ICD-10-CM | POA: Diagnosis not present

## 2017-07-28 DIAGNOSIS — E785 Hyperlipidemia, unspecified: Secondary | ICD-10-CM | POA: Diagnosis not present

## 2017-07-28 DIAGNOSIS — F0390 Unspecified dementia without behavioral disturbance: Secondary | ICD-10-CM | POA: Diagnosis not present

## 2017-07-28 DIAGNOSIS — G609 Hereditary and idiopathic neuropathy, unspecified: Secondary | ICD-10-CM | POA: Diagnosis not present

## 2017-08-01 DIAGNOSIS — N183 Chronic kidney disease, stage 3 (moderate): Secondary | ICD-10-CM | POA: Diagnosis not present

## 2017-08-01 DIAGNOSIS — I129 Hypertensive chronic kidney disease with stage 1 through stage 4 chronic kidney disease, or unspecified chronic kidney disease: Secondary | ICD-10-CM | POA: Diagnosis not present

## 2017-08-01 DIAGNOSIS — G609 Hereditary and idiopathic neuropathy, unspecified: Secondary | ICD-10-CM | POA: Diagnosis not present

## 2017-08-01 DIAGNOSIS — F0390 Unspecified dementia without behavioral disturbance: Secondary | ICD-10-CM | POA: Diagnosis not present

## 2017-08-01 DIAGNOSIS — G231 Progressive supranuclear ophthalmoplegia [Steele-Richardson-Olszewski]: Secondary | ICD-10-CM | POA: Diagnosis not present

## 2017-08-01 DIAGNOSIS — E785 Hyperlipidemia, unspecified: Secondary | ICD-10-CM | POA: Diagnosis not present

## 2017-08-02 DIAGNOSIS — N183 Chronic kidney disease, stage 3 (moderate): Secondary | ICD-10-CM | POA: Diagnosis not present

## 2017-08-02 DIAGNOSIS — F0390 Unspecified dementia without behavioral disturbance: Secondary | ICD-10-CM | POA: Diagnosis not present

## 2017-08-02 DIAGNOSIS — G231 Progressive supranuclear ophthalmoplegia [Steele-Richardson-Olszewski]: Secondary | ICD-10-CM | POA: Diagnosis not present

## 2017-08-02 DIAGNOSIS — I129 Hypertensive chronic kidney disease with stage 1 through stage 4 chronic kidney disease, or unspecified chronic kidney disease: Secondary | ICD-10-CM | POA: Diagnosis not present

## 2017-08-02 DIAGNOSIS — G609 Hereditary and idiopathic neuropathy, unspecified: Secondary | ICD-10-CM | POA: Diagnosis not present

## 2017-08-02 DIAGNOSIS — E785 Hyperlipidemia, unspecified: Secondary | ICD-10-CM | POA: Diagnosis not present

## 2017-08-03 ENCOUNTER — Telehealth: Payer: Self-pay

## 2017-08-03 NOTE — Telephone Encounter (Signed)
Pt daughter called to let us know that she has some FMLA paper work that needs to be filled out . She was informed that Dr. Redmond School is out of the office and it may take some time to get the paperwork back to her. She says she will contact her HR and fax over paperwork to [place]. Thanks [employer]

## 2017-08-04 DIAGNOSIS — E785 Hyperlipidemia, unspecified: Secondary | ICD-10-CM | POA: Diagnosis not present

## 2017-08-04 DIAGNOSIS — G609 Hereditary and idiopathic neuropathy, unspecified: Secondary | ICD-10-CM | POA: Diagnosis not present

## 2017-08-04 DIAGNOSIS — N183 Chronic kidney disease, stage 3 (moderate): Secondary | ICD-10-CM | POA: Diagnosis not present

## 2017-08-04 DIAGNOSIS — G231 Progressive supranuclear ophthalmoplegia [Steele-Richardson-Olszewski]: Secondary | ICD-10-CM | POA: Diagnosis not present

## 2017-08-04 DIAGNOSIS — I129 Hypertensive chronic kidney disease with stage 1 through stage 4 chronic kidney disease, or unspecified chronic kidney disease: Secondary | ICD-10-CM | POA: Diagnosis not present

## 2017-08-04 DIAGNOSIS — F0390 Unspecified dementia without behavioral disturbance: Secondary | ICD-10-CM | POA: Diagnosis not present

## 2017-08-08 DIAGNOSIS — E785 Hyperlipidemia, unspecified: Secondary | ICD-10-CM | POA: Diagnosis not present

## 2017-08-08 DIAGNOSIS — F0390 Unspecified dementia without behavioral disturbance: Secondary | ICD-10-CM | POA: Diagnosis not present

## 2017-08-08 DIAGNOSIS — G231 Progressive supranuclear ophthalmoplegia [Steele-Richardson-Olszewski]: Secondary | ICD-10-CM | POA: Diagnosis not present

## 2017-08-08 DIAGNOSIS — G609 Hereditary and idiopathic neuropathy, unspecified: Secondary | ICD-10-CM | POA: Diagnosis not present

## 2017-08-08 DIAGNOSIS — I129 Hypertensive chronic kidney disease with stage 1 through stage 4 chronic kidney disease, or unspecified chronic kidney disease: Secondary | ICD-10-CM | POA: Diagnosis not present

## 2017-08-08 DIAGNOSIS — N183 Chronic kidney disease, stage 3 (moderate): Secondary | ICD-10-CM | POA: Diagnosis not present

## 2017-08-09 DIAGNOSIS — F0390 Unspecified dementia without behavioral disturbance: Secondary | ICD-10-CM | POA: Diagnosis not present

## 2017-08-09 DIAGNOSIS — E785 Hyperlipidemia, unspecified: Secondary | ICD-10-CM | POA: Diagnosis not present

## 2017-08-09 DIAGNOSIS — G609 Hereditary and idiopathic neuropathy, unspecified: Secondary | ICD-10-CM | POA: Diagnosis not present

## 2017-08-09 DIAGNOSIS — G231 Progressive supranuclear ophthalmoplegia [Steele-Richardson-Olszewski]: Secondary | ICD-10-CM | POA: Diagnosis not present

## 2017-08-09 DIAGNOSIS — N183 Chronic kidney disease, stage 3 (moderate): Secondary | ICD-10-CM | POA: Diagnosis not present

## 2017-08-09 DIAGNOSIS — I129 Hypertensive chronic kidney disease with stage 1 through stage 4 chronic kidney disease, or unspecified chronic kidney disease: Secondary | ICD-10-CM | POA: Diagnosis not present

## 2017-08-10 ENCOUNTER — Ambulatory Visit (INDEPENDENT_AMBULATORY_CARE_PROVIDER_SITE_OTHER): Payer: Medicare Other | Admitting: Family Medicine

## 2017-08-10 VITALS — BP 110/70 | HR 64

## 2017-08-10 DIAGNOSIS — N135 Crossing vessel and stricture of ureter without hydronephrosis: Secondary | ICD-10-CM

## 2017-08-10 DIAGNOSIS — R531 Weakness: Secondary | ICD-10-CM | POA: Diagnosis not present

## 2017-08-10 DIAGNOSIS — F02818 Dementia in other diseases classified elsewhere, unspecified severity, with other behavioral disturbance: Secondary | ICD-10-CM

## 2017-08-10 DIAGNOSIS — C679 Malignant neoplasm of bladder, unspecified: Secondary | ICD-10-CM | POA: Diagnosis not present

## 2017-08-10 DIAGNOSIS — F0281 Dementia in other diseases classified elsewhere with behavioral disturbance: Secondary | ICD-10-CM

## 2017-08-10 DIAGNOSIS — G231 Progressive supranuclear ophthalmoplegia [Steele-Richardson-Olszewski]: Secondary | ICD-10-CM

## 2017-08-10 NOTE — Progress Notes (Signed)
   Subjective:    Patient ID: Brian Montgomery, male    DOB: Apr 20, 1936, 82 y.o.   MRN: [id number]  HPI He is here with his wife and 1 of his daughters.  He continues to slowly deteriorate.  He has difficulty getting in and out of bed without help.  He needs help with bathing, feeding, dressing.  He continues to use depends and does have a ureteral stent.  He sees urology for his underlying bladder cancer.  His wife and daughter now states that he is having difficulty with increasing slurred speech, now hallucinating, having difficulty with sleep and sometimes yelling out spontaneously.  He is taking Celexa 40 mg and no improvement with his symptoms.  He continues on his Sinemet and Aricept.  His next scheduled appointment with Dr. [name] is March 3.   Review of Systems     Objective:   Physical Exam He seems semi-alert and does have slurred speech.  His eyes are closed half the time.  He does seem to respond but not necessarily appropriately to commands.       Assessment & Plan:  Malignant neoplasm of urinary bladder, unspecified site (HCC)  Dementia associated with other underlying disease with behavioral disturbance  PSP (progressive supranuclear palsy) (HCC)  Ureteral obstruction, right  Weakness generalized I will fill out FMLA paperwork for 1 of his daughters and discussed further care with Dr. [name].

## 2017-08-11 DIAGNOSIS — N183 Chronic kidney disease, stage 3 (moderate): Secondary | ICD-10-CM | POA: Diagnosis not present

## 2017-08-11 DIAGNOSIS — I129 Hypertensive chronic kidney disease with stage 1 through stage 4 chronic kidney disease, or unspecified chronic kidney disease: Secondary | ICD-10-CM | POA: Diagnosis not present

## 2017-08-11 DIAGNOSIS — G609 Hereditary and idiopathic neuropathy, unspecified: Secondary | ICD-10-CM | POA: Diagnosis not present

## 2017-08-11 DIAGNOSIS — G231 Progressive supranuclear ophthalmoplegia [Steele-Richardson-Olszewski]: Secondary | ICD-10-CM | POA: Diagnosis not present

## 2017-08-11 DIAGNOSIS — F0390 Unspecified dementia without behavioral disturbance: Secondary | ICD-10-CM | POA: Diagnosis not present

## 2017-08-11 DIAGNOSIS — E785 Hyperlipidemia, unspecified: Secondary | ICD-10-CM | POA: Diagnosis not present

## 2017-08-15 DIAGNOSIS — G231 Progressive supranuclear ophthalmoplegia [Steele-Richardson-Olszewski]: Secondary | ICD-10-CM | POA: Diagnosis not present

## 2017-08-15 DIAGNOSIS — F0390 Unspecified dementia without behavioral disturbance: Secondary | ICD-10-CM | POA: Diagnosis not present

## 2017-08-15 DIAGNOSIS — E785 Hyperlipidemia, unspecified: Secondary | ICD-10-CM | POA: Diagnosis not present

## 2017-08-15 DIAGNOSIS — G609 Hereditary and idiopathic neuropathy, unspecified: Secondary | ICD-10-CM | POA: Diagnosis not present

## 2017-08-15 DIAGNOSIS — I129 Hypertensive chronic kidney disease with stage 1 through stage 4 chronic kidney disease, or unspecified chronic kidney disease: Secondary | ICD-10-CM | POA: Diagnosis not present

## 2017-08-15 DIAGNOSIS — N183 Chronic kidney disease, stage 3 (moderate): Secondary | ICD-10-CM | POA: Diagnosis not present

## 2017-08-16 DIAGNOSIS — I129 Hypertensive chronic kidney disease with stage 1 through stage 4 chronic kidney disease, or unspecified chronic kidney disease: Secondary | ICD-10-CM | POA: Diagnosis not present

## 2017-08-16 DIAGNOSIS — N183 Chronic kidney disease, stage 3 (moderate): Secondary | ICD-10-CM | POA: Diagnosis not present

## 2017-08-16 DIAGNOSIS — G231 Progressive supranuclear ophthalmoplegia [Steele-Richardson-Olszewski]: Secondary | ICD-10-CM | POA: Diagnosis not present

## 2017-08-16 DIAGNOSIS — F0390 Unspecified dementia without behavioral disturbance: Secondary | ICD-10-CM | POA: Diagnosis not present

## 2017-08-16 DIAGNOSIS — E785 Hyperlipidemia, unspecified: Secondary | ICD-10-CM | POA: Diagnosis not present

## 2017-08-16 DIAGNOSIS — G609 Hereditary and idiopathic neuropathy, unspecified: Secondary | ICD-10-CM | POA: Diagnosis not present

## 2017-08-17 DIAGNOSIS — I129 Hypertensive chronic kidney disease with stage 1 through stage 4 chronic kidney disease, or unspecified chronic kidney disease: Secondary | ICD-10-CM | POA: Diagnosis not present

## 2017-08-17 DIAGNOSIS — N183 Chronic kidney disease, stage 3 (moderate): Secondary | ICD-10-CM | POA: Diagnosis not present

## 2017-08-17 DIAGNOSIS — F0390 Unspecified dementia without behavioral disturbance: Secondary | ICD-10-CM | POA: Diagnosis not present

## 2017-08-17 DIAGNOSIS — G609 Hereditary and idiopathic neuropathy, unspecified: Secondary | ICD-10-CM | POA: Diagnosis not present

## 2017-08-17 DIAGNOSIS — G231 Progressive supranuclear ophthalmoplegia [Steele-Richardson-Olszewski]: Secondary | ICD-10-CM | POA: Diagnosis not present

## 2017-08-17 DIAGNOSIS — E785 Hyperlipidemia, unspecified: Secondary | ICD-10-CM | POA: Diagnosis not present

## 2017-08-18 DIAGNOSIS — E785 Hyperlipidemia, unspecified: Secondary | ICD-10-CM | POA: Diagnosis not present

## 2017-08-18 DIAGNOSIS — I129 Hypertensive chronic kidney disease with stage 1 through stage 4 chronic kidney disease, or unspecified chronic kidney disease: Secondary | ICD-10-CM | POA: Diagnosis not present

## 2017-08-18 DIAGNOSIS — N183 Chronic kidney disease, stage 3 (moderate): Secondary | ICD-10-CM | POA: Diagnosis not present

## 2017-08-18 DIAGNOSIS — G231 Progressive supranuclear ophthalmoplegia [Steele-Richardson-Olszewski]: Secondary | ICD-10-CM | POA: Diagnosis not present

## 2017-08-18 DIAGNOSIS — F0390 Unspecified dementia without behavioral disturbance: Secondary | ICD-10-CM | POA: Diagnosis not present

## 2017-08-18 DIAGNOSIS — G609 Hereditary and idiopathic neuropathy, unspecified: Secondary | ICD-10-CM | POA: Diagnosis not present

## 2017-08-24 ENCOUNTER — Ambulatory Visit (INDEPENDENT_AMBULATORY_CARE_PROVIDER_SITE_OTHER): Payer: Medicare Other | Admitting: Neurology

## 2017-08-24 ENCOUNTER — Encounter: Payer: Self-pay | Admitting: Psychology

## 2017-08-24 ENCOUNTER — Encounter: Payer: Self-pay | Admitting: Neurology

## 2017-08-24 VITALS — BP 104/58 | HR 82 | Ht 64.0 in | Wt 147.0 lb

## 2017-08-24 DIAGNOSIS — G231 Progressive supranuclear ophthalmoplegia [Steele-Richardson-Olszewski]: Secondary | ICD-10-CM | POA: Diagnosis not present

## 2017-08-24 MED ORDER — QUETIAPINE FUMARATE 25 MG PO TABS: 25.0000 mg | ORAL_TABLET | Freq: Every day | ORAL | 3 refills | Status: DC

## 2017-08-24 NOTE — Patient Instructions (Addendum)
1.  We will stop citalopram since it seems ineffective. 2.  Instead, we will start quetiapine (Seroquel) 25mg  at bedtime.  Contact me in 1 to 2 weeks with update and we can adjust dose as needed. 3.  Please review the information about PSP and resources.  Also, please attend the support group 4.  Continue donepezil and carbidopa-levodopa 5.  Follow up in 6 months.

## 2017-08-24 NOTE — Progress Notes (Signed)
NEUROLOGY FOLLOW UP OFFICE NOTE  Derric Dealmeida [phone]  HISTORY OF PRESENT ILLNESS: Brian Montgomery is a 82 year old right-handed man with hypertension, dyslipidemia, and arthritis who follows up for suspected PSP.  He is accompanied by his wife and daughters who supplement history.  Marland Kitchen   UPDATE: Medications: carbidopa-levodopa (25/100mg  three times daily).   Initially, family noticed benefit. Donepezil 10mg  at bedtime Citalopam 10mg  daily   He has had increased hallucinations, both at night and during the day.  He sees family and friends that are deceased.  He also sees animals such as horses.  Sometimes, they cause him distress.  He will start yelling out loud.  He is agitated at night and will trash around in bed.  He has fallen out of bed on some occasions.  EMS has had to be called to help lift him up.   HISTORY: Since 2015, he has had increased problems with balance.  When he gets up, he has to take a moment to maintain his balance.  He sometimes feels has to push off.  When he is walking, he feels like his body is being pulled to either side.  There is no spinning sensation, lightheadedness, nausea, vomiting, slurred speech, visual disturbance or numbness or weakness.  He denies neck pain, low back pain or pain in the legs when he walks or stands.  He has longstanding problems with hearing but no tinnitus.  Orthostatics were checked, which were negative.     He does have some urinary frequency but has BPH.  He does have symptoms suggestive of REM sleep behavior disorder (he will scream and thrash around in his sleep, one time falling out of bed).  He denies tremor.     Over time, he has had increased memory deficits, hallucinations, sundowning, sometimes grabs at the air and requires assistance with all basic ADLs, including bathing, dressing and using the toilet.   After a fall on his left side, he developed left arm pain and hand weakness.  NCV-EMG was performed on 02/25/16, which  demonstrated left median neuropathy at the wrist, consistent with carpal tunnel syndrome.  Left ulnar response showed prolonged latency in isolation, of unclear significance.  No polyneuropathy was appreciated.  He was referred to OT for left hand.     MRI of the brain from 10/27/14 showed mild to moderate chronic small vessel disease and mild global atrophy.    MRI of cervical spine from 09/01/15 showed multilevel spondylosis and mild spinal stenosis without cord deformity or signal abnormality.  He was referred again to PT.  He continues to fall and had recent fall on 11/14/15, which required an ED visit.  He fell on his left side/shoulder.  He had dislocated his shoulder but was able to pop it back in.  X-ray of left shoulder in the ED revealed osteoarthritic changes but no fracture or dislocation.  Since the fall, he has had numbness and weakness of his left hand.  He notes pain in the shoulder but denies neck pain. Labs, including TSH, glucose, ANA, Sed Rate, B12, RPR, SPEP/IFE were normal.    His wife is his POA.  PAST MEDICAL HISTORY: Past Medical History:  Diagnosis Date  . Arthritis   . Bladder cancer ([place])    infiltrative high grade papillary urothelial carcinoma   . Dementia    mild   . Dyslipidemia   . ED (erectile dysfunction)   . Hypertension   . Palsy [facility])    super nuclear  palsy managed by [name]  . Parkinson disease ([place])   . Pneumonia    x1 in adulthood     MEDICATIONS: Current Outpatient Medications on File Prior to Visit  Medication Sig Dispense Refill  . acetaminophen (TYLENOL) 500 MG tablet Take 500 mg by mouth every 6 (six) hours as needed for moderate pain or headache.    . Artificial Tear Ointment (DRY EYES OP) Place 1 drop into both eyes as needed (for dry eyes).     . carbidopa-levodopa (SINEMET IR) 25-100 MG tablet Take 1.5 tablets by mouth 3 (three) times daily. 495 tablet 2  . donepezil (ARICEPT) 10 MG tablet TAKE 1 TABLET(10 MG) BY MOUTH AT BEDTIME 30 tablet  5  . lisinopril-hydrochlorothiazide (PRINZIDE,ZESTORETIC) 10-12.5 MG tablet Take 1 tablet by mouth daily. 90 tablet 1  . Melatonin 3 MG CAPS Take 3 mg by mouth at bedtime.    Marland Kitchen Phenazopyridine HCl (AZO TABS PO) Take 1 tablet by mouth as needed (for urinary pain).    . pseudoephedrine-guaifenesin (MUCINEX D) 60-600 MG 12 hr tablet Take 1 tablet by mouth at bedtime as needed for congestion.     . simvastatin (ZOCOR) 20 MG tablet TAKE 1 TABLET BY MOUTH DAILY (Patient taking differently: TAKE 20 MG BY MOUTH DAILY) 90 tablet 0   No current facility-administered medications on file prior to visit.     ALLERGIES: No Known Allergies  FAMILY HISTORY: Family History  Problem Relation Age of Onset  . Heart failure Mother   . Hypertension Mother   . Heart failure Father   . Hypertension Father   . Renal Disease Sister   . Diabetes Brother     SOCIAL HISTORY: Social History   Socioeconomic History  . Marital status: Married    Spouse name: Not on file  . Number of children: Not on file  . Years of education: Not on file  . Highest education level: Not on file  Social Needs  . Financial resource strain: Not on file  . Food insecurity - worry: Not on file  . Food insecurity - inability: Not on file  . Transportation needs - medical: Not on file  . Transportation needs - non-medical: Not on file  Occupational History  . Not on file  Tobacco Use  . Smoking status: Former Smoker    Packs/day: 0.25    Years: 2.00    Pack years: 0.50    Types: Cigarettes    Last attempt to quit: 07/12/1967    Years since quitting: 50.1  . Smokeless tobacco: Never Used  Substance and Sexual Activity  . Alcohol use: No    Alcohol/week: 0.0 oz  . Drug use: No  . Sexual activity: Not Currently    Partners: Female  Other Topics Concern  . Not on file  Social History Narrative  . Not on file    REVIEW OF SYSTEMS: Constitutional: No fevers, chills, or sweats, no generalized fatigue, change in  appetite Eyes: No visual changes, double vision, eye pain Ear, nose and throat: No hearing loss, ear pain, nasal congestion, sore throat Cardiovascular: No chest pain, palpitations Respiratory:  No shortness of breath at rest or with exertion, wheezes GastrointestinaI: No nausea, vomiting, diarrhea, abdominal pain, fecal incontinence Genitourinary:  No dysuria, urinary retention or frequency Musculoskeletal:  No neck pain, back pain Integumentary: No rash, pruritus, skin lesions Neurological: as above Psychiatric: No depression, insomnia, anxiety Endocrine: No palpitations, fatigue, diaphoresis, mood swings, change in appetite, change in weight, increased thirst Hematologic/Lymphatic:  No purpura, petechiae. Allergic/Immunologic: no itchy/runny eyes, nasal congestion, recent allergic reactions, rashes  PHYSICAL EXAM: Vitals:   08/24/17 1055  BP: (!) 104/58  Pulse: 82  SpO2: 99%   General: No acute distress.  Patient appears well-groomed.   Head:  Normocephalic/atraumatic Eyes:  Fundi examined but not visualized Neck: supple, no paraspinal tenderness, full range of motion Heart:  Regular rate and rhythm Lungs:  Clear to auscultation bilaterally Back: No paraspinal tenderness Neurological Exam: MS:  alert and oriented to person and place but not time. Attention span and concentration impaired, recent memory poor, remote memory intact, fund of knowledge impaired.  Speech fluent and not dysarthric, language intact.   CN:  Decreased upward, downward and right horizontal gaze on tracking.  Bilateral ptosis.  Otherwise, CN II-XII intact. Bulk and tone: normal.  Motor:  3+ left deltoid and 5- left hand grip.  3+ in lower extremities.  Otherwise, muscle strength 5/5 throughout.  Bradykinesia.  No tremor.  Decreased finger-thumb tapping speed and amplitude.  Increased tone.  DTRs: 2+ throughout.  Finger to nose testing:  slowed.  Gait:  Wheelchair-bound  IMPRESSION: Progressive supranuclear  palsy  PLAN: 1.  We will stop citalopram and start Seroquel 25mg  at bedtime to help with agitation, hallucinations and sleep.  We can increase dose and frequency of dosage as needed/tolerated.  Side effects, including black box warning of increased mortality, discussed. 2.  He will continue donepezil and carbidopa-levodopa. 3.  We will help arrange for a hospital bed with rails to help prevent falls out of bed.  We also discussed possible need for home care/hospice care. 4.  Information and resources regarding PSP provided to family.  Encouraged patient and family to attend monthly meetings for atypical parkinson diseases. 5.  Follow up in 6 months.  25 minutes spent face to face with patient, over 50% spent discussing disease, prognosis and management.  Metta Clines, DO  CC:  Jill Alexanders, MD

## 2017-08-24 NOTE — Progress Notes (Signed)
I met with the patient, his wife and 2 daughters today while they were in the clinic.  I provided them with information on the [employer] PSP foundation, shared information about the respite care program that they have and also talked about anticipatory grief counseling through hospice and palliative care [place].  I provided the information on the atypical parkinsonian support group that is held and [facility] on the third Wednesday of the month.    The family seems to have a good understanding of the patient's diagnosis/prognosis. Primary concerns will be care for the patient during the day as the 2 daughters work and the patient is home with his wife. She will need some respite.  There is concerns about affordability with private pay agencies.  I mentioned that looking into the pace may be an option especially as needs increase. We talked about building a team in the sense of utilizing their friends and family who want to help in certain areas.  I suggested to have a list of small things that they can suggest that they need help with when someone asks them, "let me know if you need help with anything or let me know if I can do anything".   The patient's family members all received my contact information.  I shared that I am a part of their support team and to please utilize me with questions and concerns that they have and if they find themselves needing more resources.  The patient verbalized that ,"this is so hard"and was tearful some during the visit.  We talked about the importance of sharing those feelings and not holding them in.  The support group might be a good place for the family and/or the patient to be around other people going through similar things.  We also talked about maximizing time together, talking about what matters the most of them for the patient and as a family.

## 2017-08-24 NOTE — Addendum Note (Signed)
Addended by: Clois Comber on: 08/24/2017 02:33 PM   Modules accepted: Orders

## 2017-08-26 ENCOUNTER — Other Ambulatory Visit: Payer: Self-pay | Admitting: Neurology

## 2017-08-29 DIAGNOSIS — C678 Malignant neoplasm of overlapping sites of bladder: Secondary | ICD-10-CM | POA: Diagnosis not present

## 2017-08-29 DIAGNOSIS — C61 Malignant neoplasm of prostate: Secondary | ICD-10-CM | POA: Diagnosis not present

## 2017-08-29 DIAGNOSIS — N131 Hydronephrosis with ureteral stricture, not elsewhere classified: Secondary | ICD-10-CM | POA: Diagnosis not present

## 2017-09-07 ENCOUNTER — Other Ambulatory Visit: Payer: Self-pay | Admitting: Family Medicine

## 2017-09-07 NOTE — Telephone Encounter (Signed)
Let him know that he needs to come in for blood work.  This one slipped through the cracks

## 2017-09-07 NOTE — Telephone Encounter (Signed)
Pt has not had lipids checked since 2016. Pt was just seen here back in january for an appointment

## 2017-09-08 ENCOUNTER — Other Ambulatory Visit: Payer: Self-pay | Admitting: Family Medicine

## 2017-09-08 DIAGNOSIS — E785 Hyperlipidemia, unspecified: Secondary | ICD-10-CM

## 2017-09-08 NOTE — Telephone Encounter (Signed)
Spoke to pt's wife and she states that she will bring him in within the next 30 days.

## 2017-09-11 ENCOUNTER — Ambulatory Visit: Payer: Medicare Other | Admitting: Neurology

## 2017-09-13 ENCOUNTER — Other Ambulatory Visit: Payer: Self-pay | Admitting: Urology

## 2017-09-20 ENCOUNTER — Emergency Department (HOSPITAL_BASED_OUTPATIENT_CLINIC_OR_DEPARTMENT_OTHER)
Admit: 2017-09-20 | Discharge: 2017-09-20 | Disposition: A | Discharge status: Home / Self Care | Payer: Medicare Other | Attending: Emergency Medicine | Admitting: Emergency Medicine

## 2017-09-20 ENCOUNTER — Ambulatory Visit: Payer: Medicare Other | Admitting: Family Medicine

## 2017-09-20 ENCOUNTER — Emergency Department (HOSPITAL_COMMUNITY)
Admission: EM | Admit: 2017-09-20 | Discharge: 2017-09-20 | Disposition: A | Discharge status: Home / Self Care | Payer: Medicare Other | Attending: Emergency Medicine | Admitting: Emergency Medicine

## 2017-09-20 ENCOUNTER — Ambulatory Visit (INDEPENDENT_AMBULATORY_CARE_PROVIDER_SITE_OTHER): Payer: Medicare Other | Admitting: Family Medicine

## 2017-09-20 ENCOUNTER — Other Ambulatory Visit: Payer: Self-pay

## 2017-09-20 ENCOUNTER — Encounter (HOSPITAL_COMMUNITY): Payer: Self-pay

## 2017-09-20 VITALS — BP 122/80 | HR 56

## 2017-09-20 DIAGNOSIS — I129 Hypertensive chronic kidney disease with stage 1 through stage 4 chronic kidney disease, or unspecified chronic kidney disease: Secondary | ICD-10-CM | POA: Insufficient documentation

## 2017-09-20 DIAGNOSIS — F0390 Unspecified dementia without behavioral disturbance: Secondary | ICD-10-CM | POA: Insufficient documentation

## 2017-09-20 DIAGNOSIS — R600 Localized edema: Secondary | ICD-10-CM | POA: Diagnosis not present

## 2017-09-20 DIAGNOSIS — I82412 Acute embolism and thrombosis of left femoral vein: Secondary | ICD-10-CM | POA: Insufficient documentation

## 2017-09-20 DIAGNOSIS — R609 Edema, unspecified: Secondary | ICD-10-CM

## 2017-09-20 DIAGNOSIS — R2243 Localized swelling, mass and lump, lower limb, bilateral: Secondary | ICD-10-CM | POA: Diagnosis present

## 2017-09-20 DIAGNOSIS — N183 Chronic kidney disease, stage 3 (moderate): Secondary | ICD-10-CM | POA: Insufficient documentation

## 2017-09-20 DIAGNOSIS — Z79899 Other long term (current) drug therapy: Secondary | ICD-10-CM | POA: Diagnosis not present

## 2017-09-20 DIAGNOSIS — G20 Parkinson's disease: Secondary | ICD-10-CM | POA: Insufficient documentation

## 2017-09-20 DIAGNOSIS — Z87891 Personal history of nicotine dependence: Secondary | ICD-10-CM | POA: Diagnosis not present

## 2017-09-20 DIAGNOSIS — M7989 Other specified soft tissue disorders: Secondary | ICD-10-CM | POA: Diagnosis not present

## 2017-09-20 LAB — CBC WITH DIFFERENTIAL/PLATELET
BASOS PCT: 0 %
Basophils Absolute: 0 10*3/uL (ref 0.0–0.1)
Eosinophils Absolute: 0.2 10*3/uL (ref 0.0–0.7)
Eosinophils Relative: 3 %
HEMATOCRIT: 28.9 % — AB (ref 39.0–52.0)
HEMOGLOBIN: 9.3 g/dL — AB (ref 13.0–17.0)
LYMPHS PCT: 14 %
Lymphs Abs: 0.8 10*3/uL (ref 0.7–4.0)
MCH: 29.2 pg (ref 26.0–34.0)
MCHC: 32.2 g/dL (ref 30.0–36.0)
MCV: 90.9 fL (ref 78.0–100.0)
MONO ABS: 0.4 10*3/uL (ref 0.1–1.0)
MONOS PCT: 7 %
NEUTROS ABS: 4.7 10*3/uL (ref 1.7–7.7)
Neutrophils Relative %: 76 %
Platelets: 169 10*3/uL (ref 150–400)
RBC: 3.18 MIL/uL — ABNORMAL LOW (ref 4.22–5.81)
RDW: 15.1 % (ref 11.5–15.5)
WBC: 6.1 10*3/uL (ref 4.0–10.5)

## 2017-09-20 LAB — BASIC METABOLIC PANEL
ANION GAP: 8 (ref 5–15)
BUN: 24 mg/dL — ABNORMAL HIGH (ref 6–20)
CO2: 22 mmol/L (ref 22–32)
Calcium: 9 mg/dL (ref 8.9–10.3)
Chloride: 108 mmol/L (ref 101–111)
Creatinine, Ser: 1.62 mg/dL — ABNORMAL HIGH (ref 0.61–1.24)
GFR calc Af Amer: 45 mL/min — ABNORMAL LOW (ref >60)
GFR calc non Af Amer: 39 mL/min — ABNORMAL LOW (ref >60)
GLUCOSE: 114 mg/dL — AB (ref 65–99)
Potassium: 3.9 mmol/L (ref 3.5–5.1)
Sodium: 138 mmol/L (ref 135–145)

## 2017-09-20 LAB — PROTIME-INR
INR: 1.08
Prothrombin Time: 14 seconds (ref 11.4–15.2)

## 2017-09-20 LAB — APTT: APTT: 31 s (ref 24–36)

## 2017-09-20 MED ORDER — APIXABAN (ELIQUIS) EDUCATION KIT FOR DVT/PE PATIENTS
PACK | Freq: Once | Status: AC
  Administered 2017-09-20: 18:00:00
  Filled 2017-09-20: qty 1

## 2017-09-20 MED ORDER — APIXABAN 5 MG PO TABS: 10.0000 mg | ORAL_TABLET | Freq: Two times a day (BID) | ORAL | Status: DC

## 2017-09-20 MED ORDER — APIXABAN 5 MG PO TABS: 5.0000 mg | ORAL_TABLET | Freq: Two times a day (BID) | ORAL | Status: DC

## 2017-09-20 MED ORDER — ELIQUIS 5 MG VTE STARTER PACK: ORAL_TABLET | ORAL | 0 refills | Status: DC

## 2017-09-20 MED ORDER — APIXABAN 5 MG PO TABS
10.0000 mg | ORAL_TABLET | Freq: Once | ORAL | Status: AC
  Administered 2017-09-20: 10 mg via ORAL
  Filled 2017-09-20 (×2): qty 2

## 2017-09-20 NOTE — Discharge Instructions (Addendum)
Information from your ED Providers:   Please follow up in 5-7 days with your primary care provider for a recheck. You will need to be on Eliquis for at least 3 months. You have a starter pack prescription for one month but will need a refill from your PCP or blood doctor.  Please call to make an appointment with Dr. Julien Nordmann, who specializes in blood disorders.  Please return to the ED for any CHEST PAIN, SHORTNESS OF BREATH, EPISODES OF DIZZINESS OR LIGHTHEADEDNESS, OR PALPITATIONS (RACING HEART).  Thank you for allowing [place] to participate in your care today!    Information on my medicine - ELIQUIS (apixaban)  WHY WAS ELIQUIS PRESCRIBED FOR YOU? Eliquis was prescribed to treat blood clots that may have been found in the veins of your legs (deep vein thrombosis) or in your lungs (pulmonary embolism) and to reduce the risk of them occurring again.  WHAT DO YOU NEED TO KNOW ABOUT ELIQUIS ? The starting dose is 10 mg (two 5 mg tablets) taken TWICE daily for the FIRST SEVEN (7) DAYS, then on 3/21  the dose is reduced to ONE 5 mg tablet taken TWICE daily.  Eliquis may be taken with or without food.   Try to take the dose about the same time in the morning and in the evening. If you have difficulty swallowing the tablet whole please discuss with your pharmacist how to take the medication safely.  Take Eliquis exactly as prescribed and DO NOT stop taking Eliquis without talking to the doctor who prescribed the medication.  Stopping may increase your risk of developing a new blood clot.  Refill your prescription before you run out.  After discharge, you should have regular check-up appointments with your healthcare provider that is prescribing your Eliquis.    WHAT DO YOU DO IF YOU MISS A DOSE? If a dose of ELIQUIS is not taken at the scheduled time, take it as soon as possible on the same day and twice-daily administration should be resumed. The dose should not be doubled to make up for a  missed dose.  IMPORTANT SAFETY INFORMATION A possible side effect of Eliquis is bleeding. You should call your healthcare provider right away if you experience any of the following: Bleeding from an injury or your nose that does not stop. Unusual colored urine (red or dark brown) or unusual colored stools (red or black). Unusual bruising for unknown reasons. A serious fall or if you hit your head (even if there is no bleeding).  Some medicines may interact with Eliquis and might increase your risk of bleeding or clotting while on Eliquis. To help avoid this, consult your healthcare provider or pharmacist prior to using any new prescription or non-prescription medications, including herbals, vitamins, non-steroidal anti-inflammatory drugs (NSAIDs) and supplements.  This website has more information on Eliquis (apixaban): http://www.eliquis.com/eliquis/home

## 2017-09-20 NOTE — ED Notes (Signed)
Pt and family state they understand instructions. Home stable with family by wc.

## 2017-09-20 NOTE — Progress Notes (Signed)
   Subjective:    Patient ID: Brian Montgomery, male    DOB: 1935-09-24, 82 y.o.   MRN: [id number]  HPI He complains of a 6-day history of left leg swelling but no chest pain, shortness of breath, fever or chills.   Review of Systems     Objective:   Physical Exam Alert and in no distress.  His entire left leg is swollen to the femoral area.  Pulse was difficult to feel due to being in a wheelchair.  It was warm to touch but negative Homans sign.      Assessment & Plan:  Leg edema, left I explained that this should be handled at the hospital and recommend they go directly from my office to the hospital.  Explained that he is having some kind of blockage and this needs further evaluation other than in my office.  They were comfortable with this.

## 2017-09-20 NOTE — Progress Notes (Signed)
Left lower extremity venous duplex has been completed. There is evidence of acute deep vein thrombosis involving the common femoral, and femoral veins of the left lower extremity. Results were given to Langston Masker PA.  09/20/17 2:56 PM Brian Montgomery RVT

## 2017-09-20 NOTE — ED Provider Notes (Signed)
[facility] EMERGENCY DEPARTMENT Provider Note   CSN: [id number] Arrival date & time: 09/20/17  1120     History   Chief Complaint Chief Complaint  Patient presents with  . Leg Swelling    HPI Brian Montgomery is a 82 y.o. male.  HPI  Patient is a 82 year old male with a history of prostate cancer (treated),  bladder cancer (in remission, on Q3 month surveillance per Dr. Alinda Money) Parkinson's disease, dementia, and dyslipidemia presenting for left leg edema.  Patient presents with his wife and daughter.  They report that he has had increased lower extremity edema for 2 weeks.  Patient presented to his primary care office today who sent him to the emergency department due to concerns about blood clots in the leg.  Patient denies any pain in the lower leg, pallor, paresthesias.  Patient denies any trauma to the leg.  No fevers, chills, nausea nausea, or vomiting.  Patient denies any chest pain or shortness of breath.  Patient is bedbound the majority of the time due to his Parkinson's.  He is no longer getting home physical therapy.  Patient does not have a personal history of DVT or PE.  Past Medical History:  Diagnosis Date  . Arthritis   . Bladder cancer ([place])    infiltrative high grade papillary urothelial carcinoma   . Dementia    mild   . Dyslipidemia   . ED (erectile dysfunction)   . Hypertension   . Palsy ([place])    super nuclear palsy managed by [name]  . Parkinson disease ([place])   . Pneumonia    x1 in adulthood     Patient Active Problem List   Diagnosis Date Noted  . Ureteral obstruction, right 06/26/2017  . PSP (progressive supranuclear palsy) ([place]) 08/25/2016  . Dementia 07/03/2016  . CKD (chronic kidney disease) stage 3, GFR 30-59 ml/min (HCC) 07/01/2016  . Hip fracture ([place]) 06/30/2016  . Weakness generalized 06/13/2016  . Malignant neoplasm of prostate ([place]) 03/18/2016  . Bladder cancer ([place]) 01/21/2016  . Appendicular ataxia 09/24/2015  .  Pruritus 09/24/2015  . Hereditary and idiopathic peripheral neuropathy 01/14/2015  . Gait instability 10/13/2014  . Hyperlipidemia with target LDL less than 100 08/06/2013  . Essential hypertension, benign 09/17/2012    Past Surgical History:  Procedure Laterality Date  . APPENDECTOMY    . BACK SURGERY     lumbar ' years ago   . COLONOSCOPY  2007   Dr.kaplan  . CYSTOSCOPY N/A 03/22/2016   Procedure: CYSTOSCOPY;  Surgeon: Raynelle Bring, MD;  Location: [facility];  Service: Urology;  Laterality: N/A;  . CYSTOSCOPY W/ RETROGRADES N/A 01/21/2016   Procedure: CYSTOSCOPY WITH TURBT WITH  CLOT EVACUATION;  Surgeon: Raynelle Bring, MD;  Location: [facility];  Service: Urology;  Laterality: N/A;  . CYSTOSCOPY W/ RETROGRADES N/A 10/20/2016   Procedure: CYSTOSCOPY WITH BILATERAL RETROGRADE PYELOGRAM;  Surgeon: Raynelle Bring, MD;  Location: [facility];  Service: Urology;  Laterality: N/A;  . CYSTOSCOPY W/ URETERAL STENT PLACEMENT Right 06/26/2017   Procedure: CYSTOSCOPY WITH STENT CHANGE;  Surgeon: Raynelle Bring, MD;  Location: [facility];  Service: Urology;  Laterality: Right;  . cystoscopy with stent exchange     06-26-17 Dr Alinda Money  . CYSTOSCOPY WITH STENT PLACEMENT Right 03/16/2017   Procedure: CYSTOSCOPY WITH STENT PLACEMENT;  Surgeon: Raynelle Bring, MD;  Location: [facility];  Service: Urology;  Laterality: Right;  . INTRAMEDULLARY (IM) NAIL INTERTROCHANTERIC Left 07/01/2016   Procedure: INTRAMEDULLARY (IM) NAIL INTERTROCHANTERIC;  Surgeon: Tania Ade, MD;  Location: [facility];  Service: Orthopedics;  Laterality: Left;  . left total knee Left 2011  . PROSTATE BIOPSY N/A 03/22/2016   Procedure: BIOPSY TRANSRECTAL ULTRASONIC PROSTATE (TUBP);  Surgeon: Raynelle Bring, MD;  Location[facility];  Service: Urology;  Laterality: N/A;  . TRANSURETHRAL RESECTION OF BLADDER TUMOR N/A 03/22/2016   Procedure: TRANSURETHRAL RESECTION OF BLADDER TUMOR (TURBT);  Surgeon: Raynelle Bring, MD;  [facility];  Service: Urology;   Laterality: N/A;  . TRANSURETHRAL RESECTION OF BLADDER TUMOR N/A 10/20/2016   Procedure: TRANSURETHRAL RESECTION OF BLADDER TUMOR (TURBT);  Surgeon: Raynelle Bring, MD;  [facility];  Service: Urology;  Laterality: N/A;  . TRANSURETHRAL RESECTION OF BLADDER TUMOR N/A 03/16/2017   Procedure: TRANSURETHRAL RESECTION OF BLADDER TUMOR (TURBT);  Surgeon: Raynelle Bring, MD;  Location: [facility];  Service: Urology;  Laterality: N/A;       Home Medications    Prior to Admission medications   Medication Sig Start Date End Date Taking? Authorizing Provider  acetaminophen (TYLENOL) 500 MG tablet Take 500 mg by mouth every 6 (six) hours as needed for moderate pain or headache.    [provider]  Artificial Tear Ointment (DRY EYES OP) Place 1 drop into both eyes as needed (for dry eyes).     [provider]  carbidopa-levodopa (SINEMET IR) 25-100 MG tablet Take 1.5 tablets by mouth 3 (three) times daily. 03/14/17   [name], Adam R, DO  donepezil (ARICEPT) 10 MG tablet TAKE 1 TABLET(10 MG) BY MOUTH AT BEDTIME 08/28/17   [name], Adam R, DO  lisinopril-hydrochlorothiazide (PRINZIDE,ZESTORETIC) 10-12.5 MG tablet Take 1 tablet by mouth daily. 07/12/17   Denita Lung, MD  Melatonin 3 MG CAPS Take 3 mg by mouth at bedtime.    [provider]  Phenazopyridine HCl (AZO TABS PO) Take 1 tablet by mouth as needed (for urinary pain).    [provider]  pseudoephedrine-guaifenesin (MUCINEX D) 60-600 MG 12 hr tablet Take 1 tablet by mouth at bedtime as needed for congestion.     [provider]  QUEtiapine (SEROQUEL) 25 MG tablet Take 1 tablet (25 mg total) by mouth at bedtime. 08/24/17   Pieter Partridge, DO  simvastatin (ZOCOR) 20 MG tablet TAKE 1 TABLET BY MOUTH DAILY 09/07/17   Denita Lung, MD    Family History Family History  Problem Relation Age of Onset  . Heart failure Mother   . Hypertension Mother   . Heart failure Father   . Hypertension Father   . Renal  Disease Sister   . Diabetes Brother     Social History Social History   Tobacco Use  . Smoking status: Former Smoker    Packs/day: 0.25    Years: 2.00    Pack years: 0.50    Types: Cigarettes    Last attempt to quit: 07/12/1967    Years since quitting: 50.2  . Smokeless tobacco: Never Used  Substance Use Topics  . Alcohol use: No    Alcohol/week: 0.0 oz  . Drug use: No     Allergies   Patient has no known allergies.   Review of Systems Review of Systems  Constitutional: Negative for chills and fever.  HENT: Negative for congestion and rhinorrhea.   Respiratory: Negative for chest tightness and shortness of breath.   Cardiovascular: Positive for leg swelling. Negative for chest pain.  Gastrointestinal: Negative for abdominal pain, nausea and vomiting.  Skin: Negative for color change, rash and  wound.  All other systems reviewed and are negative.    Physical Exam Updated Vital Signs BP (!) 126/54 (BP Location: Right Arm)   Pulse 61   Temp 97.8 F (36.6 C) (Oral)   Resp 16   Ht 5\' 4"  (1.626 m)   Wt 68 kg (150 lb)   SpO2 100%   BMI 25.75 kg/m   Physical Exam  Constitutional: He appears well-developed and well-nourished. No distress.  HENT:  Head: Normocephalic and atraumatic.  Mouth/Throat: Oropharynx is clear and moist.  Eyes: Conjunctivae and EOM are normal. Pupils are equal, round, and reactive to light.  Neck: Normal range of motion. Neck supple.  Cardiovascular: Normal rate, regular rhythm, S1 normal and S2 normal.  No murmur heard. Intact, 2+ pedal pulses bilaterally. Left pedal pulses confirmed with doppler.  Pulmonary/Chest: Effort normal and breath sounds normal. He has no wheezes. He has no rales.  Abdominal: Soft. He exhibits no distension. There is no tenderness. There is no guarding.  Musculoskeletal: He exhibits edema. He exhibits no deformity.  LLE with 2+ pitting edema to the level of the left knee. Edema extends proximally up to mid-thigh.    Lymphadenopathy:    He has no cervical adenopathy.  Neurological: He is alert.  Cranial nerves grossly intact. Patient moves extremities symmetrically and with good coordination. Patient exhibits repetitive extrusion of tongue.  Skin: Skin is warm and dry. No erythema.  Psychiatric:  Flat affect.  Patient response to direct questioning.  Nursing note and vitals reviewed.    ED Treatments / Results  Labs (all labs ordered are listed, but only abnormal results are displayed) Labs Reviewed - No data to display  EKG  EKG Interpretation None       Radiology No results found.  Procedures Procedures (including critical care time)  Medications Ordered in ED Medications - No data to display   Initial Impression / Assessment and Plan / ED Course  I have reviewed the triage vital signs and the nursing notes.  Pertinent labs & imaging results that were available during my care of the patient were reviewed by me and considered in my medical decision making (see chart for details).  Clinical Course as of Sep 21 1626  Wed Sep 20, 2017  1318 Initiation of care begun.  Patient has intact, well-defined DP and PT pulses on Doppler in the left lower extremity.  [AM]  1610 Hemoglobin stable times 2 years at 9.3.  [AM]    Clinical Course User Index [AM] Albesa Seen, PA-C   Patient is nontoxic-appearing, hemodynamically stable, and not hypoxic.  Clinical suspicion is greatest for deep vein thrombosis of the left lower extremity.  Leg does not appear cellulitic.  Also doubt arterial occlusion, as pedal pulses are found with Doppler, and there is no pallor to the left lower extremity.  Patient confirmed to have a DVT of the common femoral and femoral veins of the left lower extremity.  Low suspicion for PE, as patient has no evidence of tachycardia or hypoxia.  Patient has had some tachypneic readings in the emergency department, but on multiple re-evaluations the patient,  demonstrates no increased work of breathing.  This is discussed with Dr. Theotis Burrow, who independently evaluated the patient.  I discussed patient's oncology status with Dr.Dahlstedt of [facility], who confirms record that patient is not actively treated for cancer.  Discussed proceeding with DOAC with Dr. Oneita Hurt, pharmacist.  Patient to proceed with Eliquis.  Patient and his family given education  on this medication, side effects, signs of increased bleeding.  Additionally, reviewed with patient and family return precautions for any chest pain, shortness of breath, palpitations.  Patient and family are in understanding and agree with the plan of care.  Patient follow-up with his primary care provider as well as hematology.  Of note, patient had one hypotensive reading of 90/55, which I believe is inaccurate.  Patient had a blood pressure cuff around his forearm at time of reading. Discharge pressure normotensive at 116/71.  This is a shared visit with Dr. Theotis Burrow. Patient was independently evaluated by this attending physician. Attending physician consulted in evaluation and discharge management.  Final Clinical Impressions(s) / ED Diagnoses   Final diagnoses:  Acute deep vein thrombosis (DVT) of femoral vein of left lower extremity [facility])    ED Discharge Orders        Ordered    ELIQUIS STARTER PACK [facility] STARTER PACK) 5 MG TABS     09/20/17 1718       Albesa Seen, PA-C 09/20/17 1844    Little, Wenda Over[place], MD 09/22/17 2203

## 2017-09-20 NOTE — Progress Notes (Signed)
ANTICOAGULATION CONSULT NOTE - Initial Consult  Pharmacy Consult for Eliquis Indication: DVT  No Known Allergies  Patient Measurements: Height: 5\' 4"  (162.6 cm) Weight: 150 lb (68 kg) IBW/kg (Calculated) : 59.2  Vital Signs: Temp: 97.8 F (36.6 C) (03/13 1144) Temp Source: Oral (03/13 1144) BP: 106/49 (03/13 1600) Pulse Rate: 68 (03/13 1600)  Assessment: 82 yo M presents with left leg swelling. Found to have DVT. Has history of prostate cancer but no active cancer. Hgb 9.3, plts wnl.  Goal of Therapy:  Monitor platelets by anticoagulation protocol: Yes   Plan:  Start Eliquis 10mg  PO BID x 7 days, then switch to Eliquis 5mg  PO BID Monitor CBC, s/s of bleed  Anaelle Dunton J 09/20/2017,5:05 PM

## 2017-09-20 NOTE — ED Triage Notes (Signed)
Pt presents for evaluation of L leg swelling x 2 weeks with no pain. Pt is non ambulatory at baseline. Pt PCP sent him here due to inability to feel pulse on L femoral.

## 2017-09-21 ENCOUNTER — Emergency Department (HOSPITAL_COMMUNITY): Payer: Medicare Other

## 2017-09-21 ENCOUNTER — Emergency Department (HOSPITAL_COMMUNITY)
Admission: EM | Admit: 2017-09-21 | Discharge: 2017-09-21 | Disposition: A | Discharge status: Home / Self Care | Payer: Medicare Other | Attending: Emergency Medicine | Admitting: Emergency Medicine

## 2017-09-21 ENCOUNTER — Encounter (HOSPITAL_COMMUNITY): Payer: Self-pay | Admitting: Emergency Medicine

## 2017-09-21 DIAGNOSIS — F0390 Unspecified dementia without behavioral disturbance: Secondary | ICD-10-CM | POA: Diagnosis not present

## 2017-09-21 DIAGNOSIS — R51 Headache: Secondary | ICD-10-CM | POA: Diagnosis not present

## 2017-09-21 DIAGNOSIS — N183 Chronic kidney disease, stage 3 (moderate): Secondary | ICD-10-CM | POA: Insufficient documentation

## 2017-09-21 DIAGNOSIS — Z79899 Other long term (current) drug therapy: Secondary | ICD-10-CM | POA: Insufficient documentation

## 2017-09-21 DIAGNOSIS — F17210 Nicotine dependence, cigarettes, uncomplicated: Secondary | ICD-10-CM | POA: Insufficient documentation

## 2017-09-21 DIAGNOSIS — I744 Embolism and thrombosis of arteries of extremities, unspecified: Secondary | ICD-10-CM | POA: Diagnosis not present

## 2017-09-21 DIAGNOSIS — W19XXXA Unspecified fall, initial encounter: Secondary | ICD-10-CM

## 2017-09-21 DIAGNOSIS — G8911 Acute pain due to trauma: Secondary | ICD-10-CM | POA: Diagnosis not present

## 2017-09-21 DIAGNOSIS — I129 Hypertensive chronic kidney disease with stage 1 through stage 4 chronic kidney disease, or unspecified chronic kidney disease: Secondary | ICD-10-CM | POA: Diagnosis not present

## 2017-09-21 DIAGNOSIS — S0990XA Unspecified injury of head, initial encounter: Secondary | ICD-10-CM | POA: Diagnosis not present

## 2017-09-21 NOTE — ED Triage Notes (Signed)
Patient coming from home with wife after he fell out of his wheelchair this morning while doing a "exercise". Hx parkinson's. Denies hitting head. Denies any pain. Seen yesterday for a blood clot and started on elquis. Family wants reevaluation to make sure clot hasn't moved. Swelling to left leg. Patient alert and oriented x 3. Patient unable to tell the month.

## 2017-09-21 NOTE — ED Provider Notes (Signed)
[facility] EMERGENCY DEPARTMENT Provider Note   CSN: [id number] Arrival date & time: 09/21/17  6789     History   Chief Complaint Chief Complaint  Patient presents with  . Fall    HPI Brian Montgomery is a 82 y.o. male.  The history is provided by the patient, medical records and the spouse. No language interpreter was used.  Fall  Associated symptoms include headaches.   Brian Montgomery is a 83 y.o. male  with a PMH of Parkinson's who presents to the Emergency Department with spouse for evaluation after fall just prior to arrival.  She states that he was in his wheelchair and fell backwards.  He does not remember hitting his head, but states it is possible.  No loss of consciousness.  He was seen in the emergency department yesterday for left leg swelling and diagnosed with DVT.  He was started on Eliquis.  He took that last night and again this morning.  Family reports no change in his mental status or baseline functioning.  Past Medical History:  Diagnosis Date  . Arthritis   . Bladder cancer ([place])    infiltrative high grade papillary urothelial carcinoma   . Dementia    mild   . Dyslipidemia   . ED (erectile dysfunction)   . Hypertension   . Palsy ([place])    super nuclear palsy managed by [name]  . Parkinson disease ([place])   . Pneumonia    x1 in adulthood     Patient Active Problem List   Diagnosis Date Noted  . Ureteral obstruction, right 06/26/2017  . PSP (progressive supranuclear palsy) ([place]) 08/25/2016  . Dementia 07/03/2016  . CKD (chronic kidney disease) stage 3, GFR 30-59 ml/min (HCC) 07/01/2016  . Hip fracture ([place]) 06/30/2016  . Weakness generalized 06/13/2016  . Malignant neoplasm of prostate ([place]) 03/18/2016  . Bladder cancer ([place]) 01/21/2016  . Appendicular ataxia 09/24/2015  . Pruritus 09/24/2015  . Hereditary and idiopathic peripheral neuropathy 01/14/2015  . Gait instability 10/13/2014  . Hyperlipidemia with target LDL less than 100  08/06/2013  . Essential hypertension, benign 09/17/2012    Past Surgical History:  Procedure Laterality Date  . APPENDECTOMY    . BACK SURGERY     lumbar ' years ago   . COLONOSCOPY  2007   Dr.kaplan  . CYSTOSCOPY N/A 03/22/2016   Procedure: CYSTOSCOPY;  Surgeon: Raynelle Bring, MD;  Location: [facility];  Service: Urology;  Laterality: N/A;  . CYSTOSCOPY W/ RETROGRADES N/A 01/21/2016   Procedure: CYSTOSCOPY WITH TURBT WITH  CLOT EVACUATION;  Surgeon: Raynelle Bring, MD;  Location: [facility];  Service: Urology;  Laterality: N/A;  . CYSTOSCOPY W/ RETROGRADES N/A 10/20/2016   Procedure: CYSTOSCOPY WITH BILATERAL RETROGRADE PYELOGRAM;  Surgeon: Raynelle Bring, MD;  Location: [facility];  Service: Urology;  Laterality: N/A;  . CYSTOSCOPY W/ URETERAL STENT PLACEMENT Right 06/26/2017   Procedure: CYSTOSCOPY WITH STENT CHANGE;  Surgeon: Raynelle Bring, MD;  Location: [facility];  Service: Urology;  Laterality: Right;  . cystoscopy with stent exchange     06-26-17 Dr Alinda Money  . CYSTOSCOPY WITH STENT PLACEMENT Right 03/16/2017   Procedure: CYSTOSCOPY WITH STENT PLACEMENT;  Surgeon: Raynelle Bring, MD;  Location: [facility];  Service: Urology;  Laterality: Right;  . INTRAMEDULLARY (IM) NAIL INTERTROCHANTERIC Left 07/01/2016   Procedure: INTRAMEDULLARY (IM) NAIL INTERTROCHANTERIC;  Surgeon: Tania Ade, MD;  Location: [facility];  Service: Orthopedics;  Laterality: Left;  . left total knee Left 2011  . PROSTATE BIOPSY  N/A 03/22/2016   Procedure: BIOPSY TRANSRECTAL ULTRASONIC PROSTATE (TUBP);  Surgeon: Raynelle Bring, MD;  Location: [facility];  Service: Urology;  Laterality: N/A;  . TRANSURETHRAL RESECTION OF BLADDER TUMOR N/A 03/22/2016   Procedure: TRANSURETHRAL RESECTION OF BLADDER TUMOR (TURBT);  Surgeon: Raynelle Bring, MD;  Location: [facility];  Service: Urology;  Laterality: N/A;  . TRANSURETHRAL RESECTION OF BLADDER TUMOR N/A 10/20/2016   Procedure: TRANSURETHRAL RESECTION OF BLADDER TUMOR (TURBT);  Surgeon: Raynelle Bring, MD;   [facility];  Service: Urology;  Laterality: N/A;  . TRANSURETHRAL RESECTION OF BLADDER TUMOR N/A 03/16/2017   Procedure: TRANSURETHRAL RESECTION OF BLADDER TUMOR (TURBT);  Surgeon: Raynelle Bring, MD;  Location[facility];  Service: Urology;  Laterality: N/A;       Home Medications    Prior to Admission medications   Medication Sig Start Date End Date Taking? Authorizing Provider  acetaminophen (TYLENOL) 500 MG tablet Take 500 mg by mouth every 6 (six) hours as needed for moderate pain or headache.   Yes [provider]  Artificial Tear Ointment (DRY EYES OP) Place 1 drop into both eyes as needed (for dry eyes).    Yes [provider]  carbidopa-levodopa (SINEMET IR) 25-100 MG tablet Take 1.5 tablets by mouth 3 (three) times daily. 03/14/17  Yes Jaffe, Adam R, DO  donepezil (ARICEPT) 10 MG tablet TAKE 1 TABLET(10 MG) BY MOUTH AT BEDTIME 08/28/17  Yes Jaffe, Adam R, DO  ELIQUIS STARTER PACK (ELIQUIS STARTER PACK) 5 MG TABS Take as directed on package: start with two-5mg  tablets twice daily for 7 days. On day 8, switch to one-5mg  tablet twice daily. 09/20/17  Yes Murray, Alyssa B, PA-C  lisinopril-hydrochlorothiazide (PRINZIDE,ZESTORETIC) 10-12.5 MG tablet Take 1 tablet by mouth daily. 07/12/17  Yes Denita Lung, MD  Melatonin 3 MG CAPS Take 3 mg by mouth at bedtime.   Yes [provider]  Phenazopyridine HCl (AZO TABS PO) Take 1 tablet by mouth as needed (for urinary pain).   Yes [provider]  QUEtiapine (SEROQUEL) 25 MG tablet Take 1 tablet (25 mg total) by mouth at bedtime. 08/24/17  Yes Jaffe, Adam R, DO  simvastatin (ZOCOR) 20 MG tablet TAKE 1 TABLET BY MOUTH DAILY 09/07/17  Yes Denita Lung, MD  pseudoephedrine-guaifenesin [facility] D) 60-600 MG 12 hr tablet Take 1 tablet by mouth at bedtime as needed for congestion.     [provider]    Family History Family History  Problem Relation Age of Onset  . Heart failure Mother   .  Hypertension Mother   . Heart failure Father   . Hypertension Father   . Renal Disease Sister   . Diabetes Brother     Social History Social History   Tobacco Use  . Smoking status: Former Smoker    Packs/day: 0.25    Years: 2.00    Pack years: 0.50    Types: Cigarettes    Last attempt to quit: 07/12/1967    Years since quitting: 50.2  . Smokeless tobacco: Never Used  Substance Use Topics  . Alcohol use: No    Alcohol/week: 0.0 oz  . Drug use: No     Allergies   Patient has no known allergies.   Review of Systems Review of Systems  Neurological: Positive for headaches.  All other systems reviewed and are negative.    Physical Exam Updated Vital Signs BP 118/63   Pulse 74   Temp 97.6 F (36.4 C) (Oral)   Resp Marland Kitchen)  21   Ht 5\' 4"  (1.626 m)   Wt 68 kg (150 lb)   SpO2 98%   BMI 25.75 kg/m   Physical Exam  Constitutional: He is oriented to person, place, and time. He appears well-developed and well-nourished. No distress.  HENT:  Head: Normocephalic.  Atraumatic.  Neck: Neck supple.  Cardiovascular: Normal rate, regular rhythm and normal heart sounds.  No murmur heard. Pulmonary/Chest: Effort normal and breath sounds normal. No respiratory distress.  Abdominal: Soft. He exhibits no distension. There is no tenderness.  Neurological: He is alert and oriented to person, place, and time.  A&O.  Able to give clear history of events. CN 2-12 grossly intact. Strength and sensation intact.  Skin: Skin is warm and dry.  Nursing note and vitals reviewed.    ED Treatments / Results  Labs (all labs ordered are listed, but only abnormal results are displayed) Labs Reviewed - No data to display  EKG  EKG Interpretation None       Radiology Ct Head Wo Contrast  Result Date: 09/21/2017 CLINICAL DATA:  Fall. EXAM: CT HEAD WITHOUT CONTRAST TECHNIQUE: Contiguous axial images were obtained from the base of the skull through the vertex without intravenous contrast.  COMPARISON:  None. FINDINGS: Brain: There is atrophy and chronic small vessel disease changes. No acute intracranial abnormality. Specifically, no hemorrhage, hydrocephalus, mass lesion, acute infarction, or significant intracranial injury. Old left basal ganglia lacunar infarct. Vascular: No hyperdense vessel or unexpected calcification. Skull: No acute calvarial abnormality. Sinuses/Orbits: Visualized paranasal sinuses and mastoids clear. Orbital soft tissues unremarkable. Other: None IMPRESSION: No acute intracranial abnormality. Atrophy, chronic microvascular disease. Old left basal ganglia lacunar infarct. Electronically Signed   By: Rolm Baptise M.D.   On: 09/21/2017 10:58    Procedures Procedures (including critical care time)  Medications Ordered in ED Medications - No data to display   Initial Impression / Assessment and Plan / ED Course  I have reviewed the triage vital signs and the nursing notes.  Pertinent labs & imaging results that were available during my care of the patient were reviewed by me and considered in my medical decision making (see chart for details).    Brian Montgomery is a 81 y.o. male who presents to ED for evaluation after fall backwards out of wheelchair, possibly striking head.  He is on Eliquis which started yesterday.  No focal neuro deficits.  Atraumatic on exam.  CT head with no acute findings.  Family has arranged follow-up with PCP for recently diagnosed DVT.  Encouraged to keep appointment.  Reasons to return to ER discussed and all questions answered.  Patient discussed with Dr. Johnney Killian who agrees with treatment plan.   Final Clinical Impressions(s) / ED Diagnoses   Final diagnoses:  Fall, initial encounter    ED Discharge Orders    None       Anaissa Macfadden, Ozella Almond, PA-C 09/21/17 1127    Charlesetta Shanks, MD 09/22/17 1311

## 2017-09-21 NOTE — Discharge Instructions (Signed)
It was my pleasure taking care of you today!   Fortunately, your CT scan did not show any injury from her fall.  Return to the emergency department for new or worsening symptoms, any additional concerns.

## 2017-09-22 ENCOUNTER — Telehealth: Payer: Self-pay | Admitting: Family Medicine

## 2017-09-22 ENCOUNTER — Telehealth: Payer: Self-pay | Admitting: Neurology

## 2017-09-22 ENCOUNTER — Ambulatory Visit (INDEPENDENT_AMBULATORY_CARE_PROVIDER_SITE_OTHER): Payer: Medicare Other | Admitting: Family Medicine

## 2017-09-22 ENCOUNTER — Encounter: Payer: Self-pay | Admitting: Family Medicine

## 2017-09-22 VITALS — BP 98/68 | HR 55

## 2017-09-22 DIAGNOSIS — F0281 Dementia in other diseases classified elsewhere with behavioral disturbance: Secondary | ICD-10-CM | POA: Diagnosis not present

## 2017-09-22 DIAGNOSIS — C679 Malignant neoplasm of bladder, unspecified: Secondary | ICD-10-CM | POA: Diagnosis not present

## 2017-09-22 DIAGNOSIS — F05 Delirium due to known physiological condition: Secondary | ICD-10-CM

## 2017-09-22 DIAGNOSIS — G231 Progressive supranuclear ophthalmoplegia [Steele-Richardson-Olszewski]: Secondary | ICD-10-CM | POA: Diagnosis not present

## 2017-09-22 DIAGNOSIS — F02818 Dementia in other diseases classified elsewhere, unspecified severity, with other behavioral disturbance: Secondary | ICD-10-CM

## 2017-09-22 MED ORDER — QUETIAPINE FUMARATE 50 MG PO TABS: 50.0000 mg | ORAL_TABLET | Freq: Every day | ORAL | 3 refills | Status: DC

## 2017-09-22 NOTE — Telephone Encounter (Signed)
Please advise per last OV 08/24/2017

## 2017-09-22 NOTE — Progress Notes (Signed)
   Subjective:    Patient ID: Brian Montgomery, male    DOB: 01/30/1936, 82 y.o.   MRN: [id number]  HPI He is here for consult after recent visits to the hospital.  He was initially diagnosed with DVT and placed on Eliquis.  He went back to the ER after a fall and was evaluated again.  Presently he is taking Eliquis appropriately.  His wife  states that the leg is less swollen.  She also notes that he did not have more trouble with anxiety and irritable behavior later on in the evening.  She is concerned about appropriate medication dosing with that.  He is scheduled to see his urologist within the next month or so.  He does see Dr. [name] for the dementia/PSP   Review of Systems     Objective:   Physical Exam Alert and in no distress.  Exam of the left leg does show less edema.      Assessment & Plan:  Malignant neoplasm of urinary bladder, unspecified site ([place])  Dementia associated with other underlying disease with behavioral disturbance  PSP (progressive supranuclear palsy) ([place])  Sundowning He will continue on the Eliquis for at least 3 months and probably 6 months.  Since it is getting better from no further intervention necessary.  If the leg does get worse, further evaluation including scanning to make sure there is nothing going on in the lower pelvic area would be needed. I discussed the sundowning and his behavior with the wife and daughter.  Explained that they have to deal with where he is and his reality rather than where they are.  They seem to understand this.  Recommend that they call Dr. [name] to see if he wants to readjust his medications concerning the sundowning. I will also have home health come out and do a reevaluation in lieu of the new DVT.  Discussed the fact that when he is sitting, he needs to have his legs elevated horizontally as this is probably the major factor for his DVT.

## 2017-09-22 NOTE — Telephone Encounter (Signed)
Advise patient wife to double the dose to 50 mg and patient stated she only has the current medication so I sent in a new Rx to his pharmacy for Quetiapine 50 mg

## 2017-09-22 NOTE — Telephone Encounter (Signed)
Brian Montgomery left a VM and stating she needed to see if we could change the dosage of the medication it is not working

## 2017-09-22 NOTE — Telephone Encounter (Signed)
Increase Seroquel to 50mg  at bedtime

## 2017-09-22 NOTE — Telephone Encounter (Signed)
Pt's daughter, Lynelle Smoke, called for pt to let Dr. Redmond School know that the home care agency that they use is [facility]

## 2017-09-23 NOTE — Telephone Encounter (Signed)
Have him come back and do a home health assessment in view of the fact that now he has DVT

## 2017-09-25 NOTE — Telephone Encounter (Signed)
Daughter says she will call us when she returns to town to make assessment appt for home health. [place]

## 2017-09-28 ENCOUNTER — Other Ambulatory Visit: Payer: Self-pay | Admitting: Neurology

## 2017-09-29 ENCOUNTER — Ambulatory Visit (INDEPENDENT_AMBULATORY_CARE_PROVIDER_SITE_OTHER): Payer: Medicare Other | Admitting: Family Medicine

## 2017-09-29 ENCOUNTER — Encounter: Payer: Self-pay | Admitting: Family Medicine

## 2017-09-29 VITALS — BP 130/84 | HR 68

## 2017-09-29 DIAGNOSIS — G231 Progressive supranuclear ophthalmoplegia [Steele-Richardson-Olszewski]: Secondary | ICD-10-CM

## 2017-09-29 DIAGNOSIS — I824Y2 Acute embolism and thrombosis of unspecified deep veins of left proximal lower extremity: Secondary | ICD-10-CM

## 2017-09-29 NOTE — Progress Notes (Signed)
   Subjective:    Patient ID: Brian Montgomery, male    DOB: [birth year], 82 y.o.   MRN: [id number]  HPI He is here for a consult concerning his DVT.  Review of the record indicates there was a miscommunication.  Communication change the word from "them to him" which changed a whole meaning of it.   Review of Systems     Objective:   Physical Exam Alert and in no distress.       Assessment & Plan:  PSP (progressive supranuclear palsy) (HCC)  Acute deep vein thrombosis (DVT) of proximal vein of left lower extremity ([place]) He does have a history of DVT which is a new issue.  I will therefore have home health come out and evaluate his needs in terms of PT OT etc. as wife does need help with his care.

## 2017-10-02 ENCOUNTER — Telehealth: Payer: Self-pay | Admitting: Family Medicine

## 2017-10-02 DIAGNOSIS — Z8546 Personal history of malignant neoplasm of prostate: Secondary | ICD-10-CM | POA: Diagnosis not present

## 2017-10-02 DIAGNOSIS — E785 Hyperlipidemia, unspecified: Secondary | ICD-10-CM | POA: Diagnosis not present

## 2017-10-02 DIAGNOSIS — Z87891 Personal history of nicotine dependence: Secondary | ICD-10-CM | POA: Diagnosis not present

## 2017-10-02 DIAGNOSIS — F0390 Unspecified dementia without behavioral disturbance: Secondary | ICD-10-CM | POA: Diagnosis not present

## 2017-10-02 DIAGNOSIS — N183 Chronic kidney disease, stage 3 (moderate): Secondary | ICD-10-CM | POA: Diagnosis not present

## 2017-10-02 DIAGNOSIS — I824Y2 Acute embolism and thrombosis of unspecified deep veins of left proximal lower extremity: Secondary | ICD-10-CM | POA: Diagnosis not present

## 2017-10-02 DIAGNOSIS — G231 Progressive supranuclear ophthalmoplegia [Steele-Richardson-Olszewski]: Secondary | ICD-10-CM | POA: Diagnosis not present

## 2017-10-02 DIAGNOSIS — Z8781 Personal history of (healed) traumatic fracture: Secondary | ICD-10-CM | POA: Diagnosis not present

## 2017-10-02 DIAGNOSIS — Z79899 Other long term (current) drug therapy: Secondary | ICD-10-CM | POA: Diagnosis not present

## 2017-10-02 DIAGNOSIS — Z8551 Personal history of malignant neoplasm of bladder: Secondary | ICD-10-CM | POA: Diagnosis not present

## 2017-10-02 DIAGNOSIS — I129 Hypertensive chronic kidney disease with stage 1 through stage 4 chronic kidney disease, or unspecified chronic kidney disease: Secondary | ICD-10-CM | POA: Diagnosis not present

## 2017-10-02 DIAGNOSIS — G609 Hereditary and idiopathic neuropathy, unspecified: Secondary | ICD-10-CM | POA: Diagnosis not present

## 2017-10-02 DIAGNOSIS — Z7901 Long term (current) use of anticoagulants: Secondary | ICD-10-CM | POA: Diagnosis not present

## 2017-10-02 NOTE — Telephone Encounter (Signed)
ok 

## 2017-10-02 NOTE — Telephone Encounter (Signed)
Brian Montgomery @ [facility] requesting verbal orders for:  Start of [employer] to assist with bath - 2 times a week, 4 weeks  Nursing Care - 1 time a wk then 2 times a wk every other wk for 7 weeks  Medical social work -  1 eval  Occupational therapy - eval & treat  Call Verdis Frederickson at [phone]

## 2017-10-02 NOTE — Telephone Encounter (Signed)
Call was placed to [facility] to let her know verbal was ok. Thanks [employer]

## 2017-10-05 DIAGNOSIS — G609 Hereditary and idiopathic neuropathy, unspecified: Secondary | ICD-10-CM | POA: Diagnosis not present

## 2017-10-05 DIAGNOSIS — F0390 Unspecified dementia without behavioral disturbance: Secondary | ICD-10-CM | POA: Diagnosis not present

## 2017-10-05 DIAGNOSIS — I824Y2 Acute embolism and thrombosis of unspecified deep veins of left proximal lower extremity: Secondary | ICD-10-CM | POA: Diagnosis not present

## 2017-10-05 DIAGNOSIS — I129 Hypertensive chronic kidney disease with stage 1 through stage 4 chronic kidney disease, or unspecified chronic kidney disease: Secondary | ICD-10-CM | POA: Diagnosis not present

## 2017-10-05 DIAGNOSIS — N183 Chronic kidney disease, stage 3 (moderate): Secondary | ICD-10-CM | POA: Diagnosis not present

## 2017-10-05 DIAGNOSIS — G231 Progressive supranuclear ophthalmoplegia [Steele-Richardson-Olszewski]: Secondary | ICD-10-CM | POA: Diagnosis not present

## 2017-10-06 DIAGNOSIS — N183 Chronic kidney disease, stage 3 (moderate): Secondary | ICD-10-CM | POA: Diagnosis not present

## 2017-10-06 DIAGNOSIS — I129 Hypertensive chronic kidney disease with stage 1 through stage 4 chronic kidney disease, or unspecified chronic kidney disease: Secondary | ICD-10-CM | POA: Diagnosis not present

## 2017-10-06 DIAGNOSIS — G609 Hereditary and idiopathic neuropathy, unspecified: Secondary | ICD-10-CM | POA: Diagnosis not present

## 2017-10-06 DIAGNOSIS — F0390 Unspecified dementia without behavioral disturbance: Secondary | ICD-10-CM | POA: Diagnosis not present

## 2017-10-06 DIAGNOSIS — I824Y2 Acute embolism and thrombosis of unspecified deep veins of left proximal lower extremity: Secondary | ICD-10-CM | POA: Diagnosis not present

## 2017-10-06 DIAGNOSIS — G231 Progressive supranuclear ophthalmoplegia [Steele-Richardson-Olszewski]: Secondary | ICD-10-CM | POA: Diagnosis not present

## 2017-10-09 ENCOUNTER — Telehealth: Payer: Self-pay | Admitting: Family Medicine

## 2017-10-09 DIAGNOSIS — I129 Hypertensive chronic kidney disease with stage 1 through stage 4 chronic kidney disease, or unspecified chronic kidney disease: Secondary | ICD-10-CM | POA: Diagnosis not present

## 2017-10-09 DIAGNOSIS — N183 Chronic kidney disease, stage 3 (moderate): Secondary | ICD-10-CM | POA: Diagnosis not present

## 2017-10-09 DIAGNOSIS — I824Y2 Acute embolism and thrombosis of unspecified deep veins of left proximal lower extremity: Secondary | ICD-10-CM | POA: Diagnosis not present

## 2017-10-09 DIAGNOSIS — G231 Progressive supranuclear ophthalmoplegia [Steele-Richardson-Olszewski]: Secondary | ICD-10-CM | POA: Diagnosis not present

## 2017-10-09 DIAGNOSIS — F0390 Unspecified dementia without behavioral disturbance: Secondary | ICD-10-CM | POA: Diagnosis not present

## 2017-10-09 DIAGNOSIS — G609 Hereditary and idiopathic neuropathy, unspecified: Secondary | ICD-10-CM | POA: Diagnosis not present

## 2017-10-09 NOTE — Telephone Encounter (Signed)
Nurse was contacted . [place]

## 2017-10-09 NOTE — Telephone Encounter (Signed)
ok 

## 2017-10-09 NOTE — Telephone Encounter (Signed)
[facility]   [facility] is calling requesting verbal orders for PT for for 2 times a week for 4 weeks she can be reached at [phone]

## 2017-10-10 DIAGNOSIS — F0390 Unspecified dementia without behavioral disturbance: Secondary | ICD-10-CM | POA: Diagnosis not present

## 2017-10-10 DIAGNOSIS — I824Y2 Acute embolism and thrombosis of unspecified deep veins of left proximal lower extremity: Secondary | ICD-10-CM | POA: Diagnosis not present

## 2017-10-10 DIAGNOSIS — N183 Chronic kidney disease, stage 3 (moderate): Secondary | ICD-10-CM | POA: Diagnosis not present

## 2017-10-10 DIAGNOSIS — G231 Progressive supranuclear ophthalmoplegia [Steele-Richardson-Olszewski]: Secondary | ICD-10-CM | POA: Diagnosis not present

## 2017-10-10 DIAGNOSIS — G609 Hereditary and idiopathic neuropathy, unspecified: Secondary | ICD-10-CM | POA: Diagnosis not present

## 2017-10-10 DIAGNOSIS — I129 Hypertensive chronic kidney disease with stage 1 through stage 4 chronic kidney disease, or unspecified chronic kidney disease: Secondary | ICD-10-CM | POA: Diagnosis not present

## 2017-10-11 DIAGNOSIS — G231 Progressive supranuclear ophthalmoplegia [Steele-Richardson-Olszewski]: Secondary | ICD-10-CM | POA: Diagnosis not present

## 2017-10-11 DIAGNOSIS — F0390 Unspecified dementia without behavioral disturbance: Secondary | ICD-10-CM | POA: Diagnosis not present

## 2017-10-11 DIAGNOSIS — G609 Hereditary and idiopathic neuropathy, unspecified: Secondary | ICD-10-CM | POA: Diagnosis not present

## 2017-10-11 DIAGNOSIS — N183 Chronic kidney disease, stage 3 (moderate): Secondary | ICD-10-CM | POA: Diagnosis not present

## 2017-10-11 DIAGNOSIS — I824Y2 Acute embolism and thrombosis of unspecified deep veins of left proximal lower extremity: Secondary | ICD-10-CM | POA: Diagnosis not present

## 2017-10-11 DIAGNOSIS — I129 Hypertensive chronic kidney disease with stage 1 through stage 4 chronic kidney disease, or unspecified chronic kidney disease: Secondary | ICD-10-CM | POA: Diagnosis not present

## 2017-10-12 DIAGNOSIS — I129 Hypertensive chronic kidney disease with stage 1 through stage 4 chronic kidney disease, or unspecified chronic kidney disease: Secondary | ICD-10-CM | POA: Diagnosis not present

## 2017-10-12 DIAGNOSIS — I824Y2 Acute embolism and thrombosis of unspecified deep veins of left proximal lower extremity: Secondary | ICD-10-CM | POA: Diagnosis not present

## 2017-10-12 DIAGNOSIS — G609 Hereditary and idiopathic neuropathy, unspecified: Secondary | ICD-10-CM | POA: Diagnosis not present

## 2017-10-12 DIAGNOSIS — N183 Chronic kidney disease, stage 3 (moderate): Secondary | ICD-10-CM | POA: Diagnosis not present

## 2017-10-12 DIAGNOSIS — G231 Progressive supranuclear ophthalmoplegia [Steele-Richardson-Olszewski]: Secondary | ICD-10-CM | POA: Diagnosis not present

## 2017-10-12 DIAGNOSIS — F0390 Unspecified dementia without behavioral disturbance: Secondary | ICD-10-CM | POA: Diagnosis not present

## 2017-10-13 DIAGNOSIS — G609 Hereditary and idiopathic neuropathy, unspecified: Secondary | ICD-10-CM | POA: Diagnosis not present

## 2017-10-13 DIAGNOSIS — I824Y2 Acute embolism and thrombosis of unspecified deep veins of left proximal lower extremity: Secondary | ICD-10-CM | POA: Diagnosis not present

## 2017-10-13 DIAGNOSIS — G231 Progressive supranuclear ophthalmoplegia [Steele-Richardson-Olszewski]: Secondary | ICD-10-CM | POA: Diagnosis not present

## 2017-10-13 DIAGNOSIS — I129 Hypertensive chronic kidney disease with stage 1 through stage 4 chronic kidney disease, or unspecified chronic kidney disease: Secondary | ICD-10-CM | POA: Diagnosis not present

## 2017-10-13 DIAGNOSIS — N183 Chronic kidney disease, stage 3 (moderate): Secondary | ICD-10-CM | POA: Diagnosis not present

## 2017-10-13 DIAGNOSIS — F0390 Unspecified dementia without behavioral disturbance: Secondary | ICD-10-CM | POA: Diagnosis not present

## 2017-10-17 DIAGNOSIS — G231 Progressive supranuclear ophthalmoplegia [Steele-Richardson-Olszewski]: Secondary | ICD-10-CM | POA: Diagnosis not present

## 2017-10-17 DIAGNOSIS — I824Y2 Acute embolism and thrombosis of unspecified deep veins of left proximal lower extremity: Secondary | ICD-10-CM | POA: Diagnosis not present

## 2017-10-17 DIAGNOSIS — F0390 Unspecified dementia without behavioral disturbance: Secondary | ICD-10-CM | POA: Diagnosis not present

## 2017-10-17 DIAGNOSIS — N183 Chronic kidney disease, stage 3 (moderate): Secondary | ICD-10-CM | POA: Diagnosis not present

## 2017-10-17 DIAGNOSIS — I129 Hypertensive chronic kidney disease with stage 1 through stage 4 chronic kidney disease, or unspecified chronic kidney disease: Secondary | ICD-10-CM | POA: Diagnosis not present

## 2017-10-17 DIAGNOSIS — G609 Hereditary and idiopathic neuropathy, unspecified: Secondary | ICD-10-CM | POA: Diagnosis not present

## 2017-10-18 DIAGNOSIS — G231 Progressive supranuclear ophthalmoplegia [Steele-Richardson-Olszewski]: Secondary | ICD-10-CM | POA: Diagnosis not present

## 2017-10-18 DIAGNOSIS — N183 Chronic kidney disease, stage 3 (moderate): Secondary | ICD-10-CM | POA: Diagnosis not present

## 2017-10-18 DIAGNOSIS — N390 Urinary tract infection, site not specified: Secondary | ICD-10-CM | POA: Diagnosis not present

## 2017-10-18 DIAGNOSIS — F0390 Unspecified dementia without behavioral disturbance: Secondary | ICD-10-CM | POA: Diagnosis not present

## 2017-10-18 DIAGNOSIS — I824Y2 Acute embolism and thrombosis of unspecified deep veins of left proximal lower extremity: Secondary | ICD-10-CM | POA: Diagnosis not present

## 2017-10-18 DIAGNOSIS — I129 Hypertensive chronic kidney disease with stage 1 through stage 4 chronic kidney disease, or unspecified chronic kidney disease: Secondary | ICD-10-CM | POA: Diagnosis not present

## 2017-10-18 DIAGNOSIS — G609 Hereditary and idiopathic neuropathy, unspecified: Secondary | ICD-10-CM | POA: Diagnosis not present

## 2017-10-19 DIAGNOSIS — I824Y2 Acute embolism and thrombosis of unspecified deep veins of left proximal lower extremity: Secondary | ICD-10-CM | POA: Diagnosis not present

## 2017-10-19 DIAGNOSIS — F0390 Unspecified dementia without behavioral disturbance: Secondary | ICD-10-CM | POA: Diagnosis not present

## 2017-10-19 DIAGNOSIS — I129 Hypertensive chronic kidney disease with stage 1 through stage 4 chronic kidney disease, or unspecified chronic kidney disease: Secondary | ICD-10-CM | POA: Diagnosis not present

## 2017-10-19 DIAGNOSIS — N183 Chronic kidney disease, stage 3 (moderate): Secondary | ICD-10-CM | POA: Diagnosis not present

## 2017-10-19 DIAGNOSIS — G231 Progressive supranuclear ophthalmoplegia [Steele-Richardson-Olszewski]: Secondary | ICD-10-CM | POA: Diagnosis not present

## 2017-10-19 DIAGNOSIS — G609 Hereditary and idiopathic neuropathy, unspecified: Secondary | ICD-10-CM | POA: Diagnosis not present

## 2017-10-20 DIAGNOSIS — G609 Hereditary and idiopathic neuropathy, unspecified: Secondary | ICD-10-CM | POA: Diagnosis not present

## 2017-10-20 DIAGNOSIS — I824Y2 Acute embolism and thrombosis of unspecified deep veins of left proximal lower extremity: Secondary | ICD-10-CM | POA: Diagnosis not present

## 2017-10-20 DIAGNOSIS — N183 Chronic kidney disease, stage 3 (moderate): Secondary | ICD-10-CM | POA: Diagnosis not present

## 2017-10-20 DIAGNOSIS — G231 Progressive supranuclear ophthalmoplegia [Steele-Richardson-Olszewski]: Secondary | ICD-10-CM | POA: Diagnosis not present

## 2017-10-20 DIAGNOSIS — I129 Hypertensive chronic kidney disease with stage 1 through stage 4 chronic kidney disease, or unspecified chronic kidney disease: Secondary | ICD-10-CM | POA: Diagnosis not present

## 2017-10-20 DIAGNOSIS — F0390 Unspecified dementia without behavioral disturbance: Secondary | ICD-10-CM | POA: Diagnosis not present

## 2017-10-23 ENCOUNTER — Other Ambulatory Visit: Payer: Self-pay | Admitting: Family Medicine

## 2017-10-23 DIAGNOSIS — F0390 Unspecified dementia without behavioral disturbance: Secondary | ICD-10-CM | POA: Diagnosis not present

## 2017-10-23 DIAGNOSIS — N183 Chronic kidney disease, stage 3 (moderate): Secondary | ICD-10-CM | POA: Diagnosis not present

## 2017-10-23 DIAGNOSIS — I129 Hypertensive chronic kidney disease with stage 1 through stage 4 chronic kidney disease, or unspecified chronic kidney disease: Secondary | ICD-10-CM | POA: Diagnosis not present

## 2017-10-23 DIAGNOSIS — I824Y2 Acute embolism and thrombosis of unspecified deep veins of left proximal lower extremity: Secondary | ICD-10-CM | POA: Diagnosis not present

## 2017-10-23 DIAGNOSIS — G231 Progressive supranuclear ophthalmoplegia [Steele-Richardson-Olszewski]: Secondary | ICD-10-CM | POA: Diagnosis not present

## 2017-10-23 DIAGNOSIS — G609 Hereditary and idiopathic neuropathy, unspecified: Secondary | ICD-10-CM | POA: Diagnosis not present

## 2017-10-24 DIAGNOSIS — F0390 Unspecified dementia without behavioral disturbance: Secondary | ICD-10-CM | POA: Diagnosis not present

## 2017-10-24 DIAGNOSIS — N183 Chronic kidney disease, stage 3 (moderate): Secondary | ICD-10-CM | POA: Diagnosis not present

## 2017-10-24 DIAGNOSIS — G609 Hereditary and idiopathic neuropathy, unspecified: Secondary | ICD-10-CM | POA: Diagnosis not present

## 2017-10-24 DIAGNOSIS — I129 Hypertensive chronic kidney disease with stage 1 through stage 4 chronic kidney disease, or unspecified chronic kidney disease: Secondary | ICD-10-CM | POA: Diagnosis not present

## 2017-10-24 DIAGNOSIS — I824Y2 Acute embolism and thrombosis of unspecified deep veins of left proximal lower extremity: Secondary | ICD-10-CM | POA: Diagnosis not present

## 2017-10-24 DIAGNOSIS — G231 Progressive supranuclear ophthalmoplegia [Steele-Richardson-Olszewski]: Secondary | ICD-10-CM | POA: Diagnosis not present

## 2017-10-24 NOTE — Telephone Encounter (Signed)
[facility] on [address] is requesting to fill pt celexa. Looks like it was D/C . Please advise [facility]

## 2017-10-25 DIAGNOSIS — G231 Progressive supranuclear ophthalmoplegia [Steele-Richardson-Olszewski]: Secondary | ICD-10-CM | POA: Diagnosis not present

## 2017-10-25 DIAGNOSIS — G609 Hereditary and idiopathic neuropathy, unspecified: Secondary | ICD-10-CM | POA: Diagnosis not present

## 2017-10-25 DIAGNOSIS — N183 Chronic kidney disease, stage 3 (moderate): Secondary | ICD-10-CM | POA: Diagnosis not present

## 2017-10-25 DIAGNOSIS — I824Y2 Acute embolism and thrombosis of unspecified deep veins of left proximal lower extremity: Secondary | ICD-10-CM | POA: Diagnosis not present

## 2017-10-25 DIAGNOSIS — I129 Hypertensive chronic kidney disease with stage 1 through stage 4 chronic kidney disease, or unspecified chronic kidney disease: Secondary | ICD-10-CM | POA: Diagnosis not present

## 2017-10-25 DIAGNOSIS — F0390 Unspecified dementia without behavioral disturbance: Secondary | ICD-10-CM | POA: Diagnosis not present

## 2017-10-26 DIAGNOSIS — I129 Hypertensive chronic kidney disease with stage 1 through stage 4 chronic kidney disease, or unspecified chronic kidney disease: Secondary | ICD-10-CM | POA: Diagnosis not present

## 2017-10-26 DIAGNOSIS — G609 Hereditary and idiopathic neuropathy, unspecified: Secondary | ICD-10-CM | POA: Diagnosis not present

## 2017-10-26 DIAGNOSIS — I824Y2 Acute embolism and thrombosis of unspecified deep veins of left proximal lower extremity: Secondary | ICD-10-CM | POA: Diagnosis not present

## 2017-10-26 DIAGNOSIS — N183 Chronic kidney disease, stage 3 (moderate): Secondary | ICD-10-CM | POA: Diagnosis not present

## 2017-10-26 DIAGNOSIS — F0390 Unspecified dementia without behavioral disturbance: Secondary | ICD-10-CM | POA: Diagnosis not present

## 2017-10-26 DIAGNOSIS — G231 Progressive supranuclear ophthalmoplegia [Steele-Richardson-Olszewski]: Secondary | ICD-10-CM | POA: Diagnosis not present

## 2017-10-27 DIAGNOSIS — I129 Hypertensive chronic kidney disease with stage 1 through stage 4 chronic kidney disease, or unspecified chronic kidney disease: Secondary | ICD-10-CM | POA: Diagnosis not present

## 2017-10-27 DIAGNOSIS — I824Y2 Acute embolism and thrombosis of unspecified deep veins of left proximal lower extremity: Secondary | ICD-10-CM | POA: Diagnosis not present

## 2017-10-27 DIAGNOSIS — F0390 Unspecified dementia without behavioral disturbance: Secondary | ICD-10-CM | POA: Diagnosis not present

## 2017-10-27 DIAGNOSIS — G231 Progressive supranuclear ophthalmoplegia [Steele-Richardson-Olszewski]: Secondary | ICD-10-CM | POA: Diagnosis not present

## 2017-10-27 DIAGNOSIS — N183 Chronic kidney disease, stage 3 (moderate): Secondary | ICD-10-CM | POA: Diagnosis not present

## 2017-10-27 DIAGNOSIS — G609 Hereditary and idiopathic neuropathy, unspecified: Secondary | ICD-10-CM | POA: Diagnosis not present

## 2017-10-31 ENCOUNTER — Telehealth: Payer: Self-pay | Admitting: Family Medicine

## 2017-10-31 DIAGNOSIS — I824Y2 Acute embolism and thrombosis of unspecified deep veins of left proximal lower extremity: Secondary | ICD-10-CM | POA: Diagnosis not present

## 2017-10-31 DIAGNOSIS — N183 Chronic kidney disease, stage 3 (moderate): Secondary | ICD-10-CM | POA: Diagnosis not present

## 2017-10-31 DIAGNOSIS — I129 Hypertensive chronic kidney disease with stage 1 through stage 4 chronic kidney disease, or unspecified chronic kidney disease: Secondary | ICD-10-CM | POA: Diagnosis not present

## 2017-10-31 DIAGNOSIS — G609 Hereditary and idiopathic neuropathy, unspecified: Secondary | ICD-10-CM | POA: Diagnosis not present

## 2017-10-31 DIAGNOSIS — G231 Progressive supranuclear ophthalmoplegia [Steele-Richardson-Olszewski]: Secondary | ICD-10-CM | POA: Diagnosis not present

## 2017-10-31 DIAGNOSIS — F0390 Unspecified dementia without behavioral disturbance: Secondary | ICD-10-CM | POA: Diagnosis not present

## 2017-10-31 NOTE — Telephone Encounter (Signed)
Larene Beach @ [facility] called requesting verbal orders for home health aide to assist wife with bathing twice a week for 5 weeks. Call [place] at [phone]

## 2017-11-01 NOTE — Telephone Encounter (Signed)
ok 

## 2017-11-01 NOTE — Telephone Encounter (Signed)
Brian Montgomery was caled to advise the order was ok. No answer lVM . [facility] 11-01-17

## 2017-11-02 ENCOUNTER — Encounter: Payer: Self-pay | Admitting: Neurology

## 2017-11-02 DIAGNOSIS — G609 Hereditary and idiopathic neuropathy, unspecified: Secondary | ICD-10-CM | POA: Diagnosis not present

## 2017-11-02 DIAGNOSIS — F0390 Unspecified dementia without behavioral disturbance: Secondary | ICD-10-CM | POA: Diagnosis not present

## 2017-11-02 DIAGNOSIS — N183 Chronic kidney disease, stage 3 (moderate): Secondary | ICD-10-CM | POA: Diagnosis not present

## 2017-11-02 DIAGNOSIS — I129 Hypertensive chronic kidney disease with stage 1 through stage 4 chronic kidney disease, or unspecified chronic kidney disease: Secondary | ICD-10-CM | POA: Diagnosis not present

## 2017-11-02 DIAGNOSIS — G231 Progressive supranuclear ophthalmoplegia [Steele-Richardson-Olszewski]: Secondary | ICD-10-CM | POA: Diagnosis not present

## 2017-11-02 DIAGNOSIS — I824Y2 Acute embolism and thrombosis of unspecified deep veins of left proximal lower extremity: Secondary | ICD-10-CM | POA: Diagnosis not present

## 2017-11-07 DIAGNOSIS — G609 Hereditary and idiopathic neuropathy, unspecified: Secondary | ICD-10-CM | POA: Diagnosis not present

## 2017-11-07 DIAGNOSIS — F0390 Unspecified dementia without behavioral disturbance: Secondary | ICD-10-CM | POA: Diagnosis not present

## 2017-11-07 DIAGNOSIS — I824Y2 Acute embolism and thrombosis of unspecified deep veins of left proximal lower extremity: Secondary | ICD-10-CM | POA: Diagnosis not present

## 2017-11-07 DIAGNOSIS — N183 Chronic kidney disease, stage 3 (moderate): Secondary | ICD-10-CM | POA: Diagnosis not present

## 2017-11-07 DIAGNOSIS — G231 Progressive supranuclear ophthalmoplegia [Steele-Richardson-Olszewski]: Secondary | ICD-10-CM | POA: Diagnosis not present

## 2017-11-07 DIAGNOSIS — I129 Hypertensive chronic kidney disease with stage 1 through stage 4 chronic kidney disease, or unspecified chronic kidney disease: Secondary | ICD-10-CM | POA: Diagnosis not present

## 2017-11-09 DIAGNOSIS — N183 Chronic kidney disease, stage 3 (moderate): Secondary | ICD-10-CM | POA: Diagnosis not present

## 2017-11-09 DIAGNOSIS — G231 Progressive supranuclear ophthalmoplegia [Steele-Richardson-Olszewski]: Secondary | ICD-10-CM | POA: Diagnosis not present

## 2017-11-09 DIAGNOSIS — I129 Hypertensive chronic kidney disease with stage 1 through stage 4 chronic kidney disease, or unspecified chronic kidney disease: Secondary | ICD-10-CM | POA: Diagnosis not present

## 2017-11-09 DIAGNOSIS — F0390 Unspecified dementia without behavioral disturbance: Secondary | ICD-10-CM | POA: Diagnosis not present

## 2017-11-09 DIAGNOSIS — I824Y2 Acute embolism and thrombosis of unspecified deep veins of left proximal lower extremity: Secondary | ICD-10-CM | POA: Diagnosis not present

## 2017-11-09 DIAGNOSIS — G609 Hereditary and idiopathic neuropathy, unspecified: Secondary | ICD-10-CM | POA: Diagnosis not present

## 2017-11-13 DIAGNOSIS — I824Y2 Acute embolism and thrombosis of unspecified deep veins of left proximal lower extremity: Secondary | ICD-10-CM | POA: Diagnosis not present

## 2017-11-13 DIAGNOSIS — N183 Chronic kidney disease, stage 3 (moderate): Secondary | ICD-10-CM | POA: Diagnosis not present

## 2017-11-13 DIAGNOSIS — G609 Hereditary and idiopathic neuropathy, unspecified: Secondary | ICD-10-CM | POA: Diagnosis not present

## 2017-11-13 DIAGNOSIS — I129 Hypertensive chronic kidney disease with stage 1 through stage 4 chronic kidney disease, or unspecified chronic kidney disease: Secondary | ICD-10-CM | POA: Diagnosis not present

## 2017-11-13 DIAGNOSIS — G231 Progressive supranuclear ophthalmoplegia [Steele-Richardson-Olszewski]: Secondary | ICD-10-CM | POA: Diagnosis not present

## 2017-11-13 DIAGNOSIS — F0390 Unspecified dementia without behavioral disturbance: Secondary | ICD-10-CM | POA: Diagnosis not present

## 2017-11-14 ENCOUNTER — Telehealth: Payer: Self-pay

## 2017-11-14 DIAGNOSIS — G609 Hereditary and idiopathic neuropathy, unspecified: Secondary | ICD-10-CM | POA: Diagnosis not present

## 2017-11-14 DIAGNOSIS — I129 Hypertensive chronic kidney disease with stage 1 through stage 4 chronic kidney disease, or unspecified chronic kidney disease: Secondary | ICD-10-CM | POA: Diagnosis not present

## 2017-11-14 DIAGNOSIS — N183 Chronic kidney disease, stage 3 (moderate): Secondary | ICD-10-CM | POA: Diagnosis not present

## 2017-11-14 DIAGNOSIS — G231 Progressive supranuclear ophthalmoplegia [Steele-Richardson-Olszewski]: Secondary | ICD-10-CM | POA: Diagnosis not present

## 2017-11-14 DIAGNOSIS — I824Y2 Acute embolism and thrombosis of unspecified deep veins of left proximal lower extremity: Secondary | ICD-10-CM | POA: Diagnosis not present

## 2017-11-14 DIAGNOSIS — F0390 Unspecified dementia without behavioral disturbance: Secondary | ICD-10-CM | POA: Diagnosis not present

## 2017-11-14 NOTE — Telephone Encounter (Signed)
Called wife and let her know that she should contact insurance company to find out if they cover another med . She will call office back with info. [place] 11-14-17

## 2017-11-14 NOTE — Telephone Encounter (Signed)
Wife called to see if there was another blood thinner med she could get for her husband than eliquis. Pt wife says that this med just cost to much . Please [place]

## 2017-11-14 NOTE — Telephone Encounter (Signed)
Let her know that unfortunately all of them are expensive.  She can check with her insurance carrier and see which one might be cheaper.

## 2017-11-15 ENCOUNTER — Other Ambulatory Visit: Payer: Self-pay

## 2017-11-15 DIAGNOSIS — I129 Hypertensive chronic kidney disease with stage 1 through stage 4 chronic kidney disease, or unspecified chronic kidney disease: Secondary | ICD-10-CM | POA: Diagnosis not present

## 2017-11-15 DIAGNOSIS — F0390 Unspecified dementia without behavioral disturbance: Secondary | ICD-10-CM | POA: Diagnosis not present

## 2017-11-15 DIAGNOSIS — I824Y2 Acute embolism and thrombosis of unspecified deep veins of left proximal lower extremity: Secondary | ICD-10-CM | POA: Diagnosis not present

## 2017-11-15 DIAGNOSIS — G231 Progressive supranuclear ophthalmoplegia [Steele-Richardson-Olszewski]: Secondary | ICD-10-CM | POA: Diagnosis not present

## 2017-11-15 DIAGNOSIS — N183 Chronic kidney disease, stage 3 (moderate): Secondary | ICD-10-CM | POA: Diagnosis not present

## 2017-11-15 DIAGNOSIS — G609 Hereditary and idiopathic neuropathy, unspecified: Secondary | ICD-10-CM | POA: Diagnosis not present

## 2017-11-15 MED ORDER — APIXABAN 5 MG PO TABS: 5.0000 mg | ORAL_TABLET | Freq: Two times a day (BID) | ORAL | 1 refills | Status: DC

## 2017-11-15 NOTE — Patient Instructions (Addendum)
Brian Montgomery  11/15/2017   Your procedure is scheduled on: Thursday 11/23/2017  Report to [facility] Main  Entrance              Report to admitting at  130  PM    Call this number if you have problems the morning of surgery [phone]    Remember: Do not eat food :After Midnight.  May have clear liquids from midnight up until 0930 am then nothing until after surgery!      CLEAR LIQUID DIET   Foods Allowed                                                                     Foods Excluded  Coffee and tea, regular and decaf                             liquids that you cannot  Plain Jell-O in any flavor                                             see through such as: Fruit ices (not with fruit pulp)                                     milk, soups, orange juice  Iced Popsicles                                    All solid food Carbonated beverages, regular and diet                                    Cranberry, grape and apple juices Sports drinks like Gatorade Lightly seasoned clear broth or consume(fat free) Sugar, honey syrup  Sample Menu Breakfast                                Lunch                                     Supper Cranberry juice                    Beef broth                            Chicken broth Jell-O                                     Grape juice  Apple juice Coffee or tea                        Jell-O                                      Popsicle                                                Coffee or tea                        Coffee or tea  _____________________________________________________________________    Take these medicines the morning of surgery with A SIP OF WATER: Citalopram (Celexa), Carbidopa-Levodopa (Sinemet IR), Simvastatin (Zocor)                                You may not have any metal on your body including hair pins and              piercings  Do not wear jewelry, make-up,  lotions, powders or perfumes, deodorant                     Men may shave face and neck.   Do not bring valuables to the hospital. [place].  Contacts, dentures or bridgework may not be worn into surgery.  Leave suitcase in the car. After surgery it may be brought to your room.                  Please read over the following fact sheets you were given: _____________________________________________________________________             [facility] - Preparing for Surgery Before surgery, you can play an important role.  Because skin is not sterile, your skin needs to be as free of germs as possible.  You can reduce the number of germs on your skin by washing with CHG (chlorahexidine gluconate) soap before surgery.  CHG is an antiseptic cleaner which kills germs and bonds with the skin to continue killing germs even after washing. Please DO NOT use if you have an allergy to CHG or antibacterial soaps.  If your skin becomes reddened/irritated stop using the CHG and inform your nurse when you arrive at Short Stay. Do not shave (including legs and underarms) for at least 48 hours prior to the first CHG shower.  You may shave your face/neck. Please follow these instructions carefully:  1.  Shower with CHG Soap the night before surgery and the  morning of Surgery.  2.  If you choose to wash your hair, wash your hair first as usual with your  normal  shampoo.  3.  After you shampoo, rinse your hair and body thoroughly to remove the  shampoo.                           4.  Use CHG as you would any other liquid soap.  You can apply chg directly  to the skin and wash  Gently with a scrungie or clean washcloth.  5.  Apply the CHG Soap to your body ONLY FROM THE NECK DOWN.   Do not use on face/ open                           Wound or open sores. Avoid contact with eyes, ears mouth and genitals (private parts).                        Wash face,  Genitals (private parts) with your normal soap.             6.  Wash thoroughly, paying special attention to the area where your surgery  will be performed.  7.  Thoroughly rinse your body with warm water from the neck down.  8.  DO NOT shower/wash with your normal soap after using and rinsing off  the CHG Soap.                9.  Pat yourself dry with a clean towel.            10.  Wear clean pajamas.            11.  Place clean sheets on your bed the night of your first shower and do not  sleep with pets. Day of Surgery : Do not apply any lotions/deodorants the morning of surgery.  Please wear clean clothes to the hospital/surgery center.  FAILURE TO FOLLOW THESE INSTRUCTIONS MAY RESULT IN THE CANCELLATION OF YOUR SURGERY PATIENT SIGNATURE_________________________________  NURSE SIGNATURE__________________________________  ________________________________________________________________________

## 2017-11-16 ENCOUNTER — Other Ambulatory Visit: Payer: Self-pay

## 2017-11-16 ENCOUNTER — Encounter (INDEPENDENT_AMBULATORY_CARE_PROVIDER_SITE_OTHER): Payer: Self-pay

## 2017-11-16 ENCOUNTER — Encounter (HOSPITAL_COMMUNITY): Payer: Self-pay

## 2017-11-16 ENCOUNTER — Encounter (HOSPITAL_COMMUNITY)
Admission: RE | Admit: 2017-11-16 | Discharge: 2017-11-16 | Disposition: A | Discharge status: Home / Self Care | Payer: Medicare Other | Source: Ambulatory Visit | Attending: Urology | Admitting: Urology

## 2017-11-16 DIAGNOSIS — N135 Crossing vessel and stricture of ureter without hydronephrosis: Secondary | ICD-10-CM | POA: Diagnosis not present

## 2017-11-16 DIAGNOSIS — C679 Malignant neoplasm of bladder, unspecified: Secondary | ICD-10-CM | POA: Insufficient documentation

## 2017-11-16 DIAGNOSIS — Z01812 Encounter for preprocedural laboratory examination: Secondary | ICD-10-CM | POA: Diagnosis not present

## 2017-11-16 DIAGNOSIS — I10 Essential (primary) hypertension: Secondary | ICD-10-CM | POA: Insufficient documentation

## 2017-11-16 HISTORY — DX: Malignant neoplasm of prostate: C61

## 2017-11-16 LAB — BASIC METABOLIC PANEL
ANION GAP: 11 (ref 5–15)
BUN: 39 mg/dL — ABNORMAL HIGH (ref 6–20)
CO2: 23 mmol/L (ref 22–32)
Calcium: 9.6 mg/dL (ref 8.9–10.3)
Chloride: 110 mmol/L (ref 101–111)
Creatinine, Ser: 2.46 mg/dL — ABNORMAL HIGH (ref 0.61–1.24)
GFR, EST AFRICAN AMERICAN: 27 mL/min — AB (ref >60)
GFR, EST NON AFRICAN AMERICAN: 23 mL/min — AB (ref >60)
GLUCOSE: 108 mg/dL — AB (ref 65–99)
POTASSIUM: 4.6 mmol/L (ref 3.5–5.1)
Sodium: 144 mmol/L (ref 135–145)

## 2017-11-16 LAB — CBC
HEMATOCRIT: 29.3 % — AB (ref 39.0–52.0)
HEMOGLOBIN: 9.5 g/dL — AB (ref 13.0–17.0)
MCH: 29.5 pg (ref 26.0–34.0)
MCHC: 32.4 g/dL (ref 30.0–36.0)
MCV: 91 fL (ref 78.0–100.0)
Platelets: 237 10*3/uL (ref 150–400)
RBC: 3.22 MIL/uL — AB (ref 4.22–5.81)
RDW: 14.2 % (ref 11.5–15.5)
WBC: 6.7 10*3/uL (ref 4.0–10.5)

## 2017-11-20 DIAGNOSIS — N183 Chronic kidney disease, stage 3 (moderate): Secondary | ICD-10-CM | POA: Diagnosis not present

## 2017-11-20 DIAGNOSIS — F0390 Unspecified dementia without behavioral disturbance: Secondary | ICD-10-CM | POA: Diagnosis not present

## 2017-11-20 DIAGNOSIS — G231 Progressive supranuclear ophthalmoplegia [Steele-Richardson-Olszewski]: Secondary | ICD-10-CM | POA: Diagnosis not present

## 2017-11-20 DIAGNOSIS — I824Y2 Acute embolism and thrombosis of unspecified deep veins of left proximal lower extremity: Secondary | ICD-10-CM | POA: Diagnosis not present

## 2017-11-20 DIAGNOSIS — G609 Hereditary and idiopathic neuropathy, unspecified: Secondary | ICD-10-CM | POA: Diagnosis not present

## 2017-11-20 DIAGNOSIS — I129 Hypertensive chronic kidney disease with stage 1 through stage 4 chronic kidney disease, or unspecified chronic kidney disease: Secondary | ICD-10-CM | POA: Diagnosis not present

## 2017-11-22 DIAGNOSIS — I129 Hypertensive chronic kidney disease with stage 1 through stage 4 chronic kidney disease, or unspecified chronic kidney disease: Secondary | ICD-10-CM | POA: Diagnosis not present

## 2017-11-22 DIAGNOSIS — I824Y2 Acute embolism and thrombosis of unspecified deep veins of left proximal lower extremity: Secondary | ICD-10-CM | POA: Diagnosis not present

## 2017-11-22 DIAGNOSIS — G231 Progressive supranuclear ophthalmoplegia [Steele-Richardson-Olszewski]: Secondary | ICD-10-CM | POA: Diagnosis not present

## 2017-11-22 DIAGNOSIS — N183 Chronic kidney disease, stage 3 (moderate): Secondary | ICD-10-CM | POA: Diagnosis not present

## 2017-11-22 DIAGNOSIS — G609 Hereditary and idiopathic neuropathy, unspecified: Secondary | ICD-10-CM | POA: Diagnosis not present

## 2017-11-22 DIAGNOSIS — F0390 Unspecified dementia without behavioral disturbance: Secondary | ICD-10-CM | POA: Diagnosis not present

## 2017-11-22 NOTE — H&P (Signed)
CC/HPI: 1. Bladder cancer  2. Prostate cancer  3. Right ureteral obstruction   Mr. Brian Montgomery returns today after having undergone cystoscopy and right ureteral stent change in December. His stent was noted to be minimally encrusted. He did have a 6 x 24 Bard Inlay Optima stent placed at that time. Overall, he has been doing relatively well with regard to his urologic issues. He has not had any hematuria or further urinary difficulties. His overall health has been stable. He did have some issues with hallucinations and has had his medications altered perl neurology with some improvement.     ALLERGIES: No Allergies    MEDICATIONS: Benzonatate 100 mg capsule  Carbidopa-Levodopa 25 mg-100 mg tablet  Citalopram Hbr 10 mg tablet  Donepezil Hcl 5 mg tablet Oral  Hydrocodone-Acetaminophen 5 mg-325 mg tablet  Lisinopril-Hydrochlorothiazide 10 mg-12.5 mg tablet  Prochlorperazine Maleate 10 mg tablet  Quetiapine Fumarate 25 mg tablet  Simvastatin 20 mg tablet Oral  Tylenol     GU PSH: Catheterization For Collection Of Specimen, Single Patient, All Places Of Service - 06/15/2017, 03/10/2017, 02/15/2017 Catheterize For Residual - 01/18/2016 Cysto Dilate Stricture (M or F) - 01/19/2016 Cystoscopy - 08/17/2016, 01/19/2016 Cystoscopy Insert Stent, Right - 06/26/2017, Right - 03/16/2017 Cystoscopy Irrigate Clot - 01/21/2016 Cystoscopy TURBT <2 cm - 03/22/2016 Cystoscopy TURBT >5 cm - 01/21/2016 Cystoscopy TURBT 2-5 cm - 03/16/2017, 10/20/2016 Initial Male VB Sounds - 01/19/2016 Locm 300-399Mg /Ml Iodine,1Ml - 02/09/2017, 01/29/2016 PLACE RT DEVICE/MARKER, PROS - 04/13/2016 Prostate Needle Biopsy - 03/22/2016      PSH Notes: Back Surgery   NON-GU PSH: Hip Arthroscopy/surgery, Left    GU PMH: Acute Cystitis/UTI - 02/15/2017 Hydronephrosis - 02/15/2017 Prostate Cancer - 04/06/2016 Gross hematuria, Gross hematuria - 12/09/2015 Bladder Cancer overlapping sites      PMH Notes:   1) Prostate cancer: He presented  with a prostate nodule and a PSA of 9.3. He underwent a TRUS biopsy demonstrating 5 out of 12 biopsy cores positive for malignancy. He was already proceeding with radiation for treatment of his bladder cancer and therefore elected to simultaneously treat his prostate cancer with EBRT as well.   Sep 2017: 12 core biopsy - 5/12 cores positive for Gleason 3+3=6 adenocarcinoma  Oct-Nov 2017: 71 Gy radiation with 45 Gy to pelvic lymph nodes (for bladder cancer)   2) Bladder cancer: He developed gross hematuria in May 2017. This was initially thought to be related to infection but persisted and ultimately he required catheterization. He underwent cystoscopy in the OR in July 2017 due to an inability to well visualize the bladder. He was ultimately found to have a large 6 cm bladder tumor and underwent TURBT. He was noted to have muscle invasive bladder cancer. He declined radical cystectomy and elected to proceed with radiation/chemotherapy trimodality therapy.   Jul 2017: Cysto (no definite tumors seen), large formed clot in bladder  Jul 2017: TURBT - High grade muscle invasive urothelial carcinoma  Sep 2017: TUR of residual tumor prior to radiation (residual low grade, Ta disease)  Oct-Nov 2017: 20 Gy to bladder, 45 Gy to pelvic lymph nodes (carboplatin chemotherapy)  Apr 2018: TUR - polypoid cystitis without evidence of malignancy  Aug 2018: Development of right hydronephrosis and questionable bladder recurrence  Sep 2018: TUR- negative for malignancy, Right ureteral stent placement   3) Right hydronephrosis: He was noted to have incidentally detected right hydronephrosis in the summer of 2018 suggestive of possible right ureteral obstruction. He underwent his initial right  ureteral stent placement in September 2018.     NON-GU PMH: Personal history of other diseases of the nervous system and sense organs, History of polyneuropathy - 12/09/2015 Arthritis Hypercholesterolemia Hypertension     FAMILY HISTORY: Death of parent - Runs In Family malignant neoplasm of kidney - Runs In Family   SOCIAL HISTORY: Marital Status: Married Preferred Language: English; Ethnicity: ; Race: Black or African American     Notes: Married, Number of children, Retired, Former smoker, Alcohol use   REVIEW OF SYSTEMS:    GU Review Male:   Patient denies frequent urination, hard to postpone urination, burning/ pain with urination, get up at night to urinate, leakage of urine, stream starts and stops, trouble starting your streams, and have to strain to urinate .  Gastrointestinal (Upper):   Patient denies nausea and vomiting.  Gastrointestinal (Lower):   Patient denies diarrhea and constipation.  Constitutional:   Patient denies fever, night sweats, weight loss, and fatigue.  Skin:   Patient denies skin rash/ lesion and itching.  Eyes:   Patient denies blurred vision and double vision.  Ears/ Nose/ Throat:   Patient denies sore throat and sinus problems.  Hematologic/Lymphatic:   Patient denies swollen glands and easy bruising.  Cardiovascular:   Patient denies leg swelling and chest pains.  Respiratory:   Patient denies cough and shortness of breath.  Endocrine:   Patient denies excessive thirst.  Musculoskeletal:   Patient denies back pain and joint pain.  Neurological:   Patient denies headaches and dizziness.  Psychologic:   Patient denies depression and anxiety.   VITAL SIGNS:     Weight 150 lb / 68.04 kg  Height 64 in / 162.56 cm  BMI 25.7 kg/m   MULTI-SYSTEM PHYSICAL EXAMINATION:    Constitutional: Well-nourished. No physical deformities. Normally developed. Good grooming.  Respiratory: No labored breathing, no use of accessory muscles. Clear bilaterally.  Cardiovascular: Normal temperature, normal extremity pulses, no swelling, no varicosities. Regular rate and rhythm.    ASSESSMENT:   1 GU:   Prostate Cancer - C61   2   Bladder Cancer overlapping sites - C67.8   3   Ureteral  obstruction - N13.1    PLAN:      Right ureteral obstruction: We have discussed proceeding with cystoscopy and right ureteral stent change. We reviewed this procedure in detail. His wife and his daughter were present and agreed to proceed. As we have done before, he will be admitted overnight postoperatively.

## 2017-11-23 ENCOUNTER — Observation Stay (HOSPITAL_COMMUNITY): Payer: Medicare Other

## 2017-11-23 ENCOUNTER — Ambulatory Visit (HOSPITAL_COMMUNITY): Payer: Medicare Other | Admitting: Anesthesiology

## 2017-11-23 ENCOUNTER — Other Ambulatory Visit: Payer: Self-pay

## 2017-11-23 ENCOUNTER — Encounter (HOSPITAL_COMMUNITY): Payer: Self-pay | Admitting: *Deleted

## 2017-11-23 ENCOUNTER — Observation Stay (HOSPITAL_COMMUNITY)
Admission: RE | Admit: 2017-11-23 | Discharge: 2017-11-24 | Disposition: A | Discharge status: Home / Self Care | Payer: Medicare Other | Source: Ambulatory Visit | Attending: Urology | Admitting: Urology

## 2017-11-23 ENCOUNTER — Encounter (HOSPITAL_COMMUNITY)
Admission: RE | Disposition: A | Discharge status: Home / Self Care | Payer: Self-pay | Source: Ambulatory Visit | Attending: Urology

## 2017-11-23 DIAGNOSIS — H04123 Dry eye syndrome of bilateral lacrimal glands: Secondary | ICD-10-CM | POA: Insufficient documentation

## 2017-11-23 DIAGNOSIS — I129 Hypertensive chronic kidney disease with stage 1 through stage 4 chronic kidney disease, or unspecified chronic kidney disease: Secondary | ICD-10-CM | POA: Diagnosis not present

## 2017-11-23 DIAGNOSIS — Z8551 Personal history of malignant neoplasm of bladder: Secondary | ICD-10-CM | POA: Diagnosis not present

## 2017-11-23 DIAGNOSIS — Z87891 Personal history of nicotine dependence: Secondary | ICD-10-CM | POA: Diagnosis not present

## 2017-11-23 DIAGNOSIS — F0280 Dementia in other diseases classified elsewhere without behavioral disturbance: Secondary | ICD-10-CM | POA: Diagnosis not present

## 2017-11-23 DIAGNOSIS — Z993 Dependence on wheelchair: Secondary | ICD-10-CM | POA: Diagnosis not present

## 2017-11-23 DIAGNOSIS — Z8546 Personal history of malignant neoplasm of prostate: Secondary | ICD-10-CM | POA: Diagnosis not present

## 2017-11-23 DIAGNOSIS — Z923 Personal history of irradiation: Secondary | ICD-10-CM | POA: Diagnosis not present

## 2017-11-23 DIAGNOSIS — N183 Chronic kidney disease, stage 3 (moderate): Secondary | ICD-10-CM | POA: Diagnosis not present

## 2017-11-23 DIAGNOSIS — Z7901 Long term (current) use of anticoagulants: Secondary | ICD-10-CM | POA: Insufficient documentation

## 2017-11-23 DIAGNOSIS — I10 Essential (primary) hypertension: Secondary | ICD-10-CM | POA: Insufficient documentation

## 2017-11-23 DIAGNOSIS — F0390 Unspecified dementia without behavioral disturbance: Secondary | ICD-10-CM | POA: Insufficient documentation

## 2017-11-23 DIAGNOSIS — N135 Crossing vessel and stricture of ureter without hydronephrosis: Secondary | ICD-10-CM | POA: Diagnosis not present

## 2017-11-23 DIAGNOSIS — G20 Parkinson's disease: Secondary | ICD-10-CM | POA: Insufficient documentation

## 2017-11-23 DIAGNOSIS — N131 Hydronephrosis with ureteral stricture, not elsewhere classified: Secondary | ICD-10-CM | POA: Diagnosis not present

## 2017-11-23 DIAGNOSIS — E7800 Pure hypercholesterolemia, unspecified: Secondary | ICD-10-CM | POA: Insufficient documentation

## 2017-11-23 DIAGNOSIS — M1990 Unspecified osteoarthritis, unspecified site: Secondary | ICD-10-CM | POA: Diagnosis not present

## 2017-11-23 DIAGNOSIS — Z79899 Other long term (current) drug therapy: Secondary | ICD-10-CM | POA: Diagnosis not present

## 2017-11-23 DIAGNOSIS — C679 Malignant neoplasm of bladder, unspecified: Secondary | ICD-10-CM | POA: Diagnosis not present

## 2017-11-23 HISTORY — PX: CYSTOSCOPY WITH STENT PLACEMENT: SHX5790

## 2017-11-23 SURGERY — CYSTOSCOPY, WITH STENT INSERTION
Anesthesia: General | Site: Ureter | Laterality: Right

## 2017-11-23 MED ORDER — SIMVASTATIN 20 MG PO TABS
20.0000 mg | ORAL_TABLET | Freq: Every day | ORAL | Status: DC
  Administered 2017-11-24: 20 mg via ORAL
  Filled 2017-11-23: qty 1

## 2017-11-23 MED ORDER — ONDANSETRON HCL 4 MG/2ML IJ SOLN
INTRAMUSCULAR | Status: AC
  Filled 2017-11-23: qty 2

## 2017-11-23 MED ORDER — SODIUM CHLORIDE 0.9 % IV SOLN
INTRAVENOUS | Status: DC
  Administered 2017-11-23: 14:00:00 via INTRAVENOUS

## 2017-11-23 MED ORDER — DEXTROSE 5 % IV SOLN
INTRAVENOUS | Status: DC | PRN
  Administered 2017-11-23: 40 ug/min via INTRAVENOUS

## 2017-11-23 MED ORDER — FENTANYL CITRATE (PF) 100 MCG/2ML IJ SOLN
INTRAMUSCULAR | Status: AC
  Filled 2017-11-23: qty 2

## 2017-11-23 MED ORDER — SODIUM CHLORIDE 0.9 % IV SOLN: 250.0000 mL | INTRAVENOUS | Status: DC | PRN

## 2017-11-23 MED ORDER — HYDROCHLOROTHIAZIDE 12.5 MG PO CAPS
12.5000 mg | ORAL_CAPSULE | Freq: Every day | ORAL | Status: DC
Start: 2017-11-24 — End: 2017-11-24
  Administered 2017-11-24: 12.5 mg via ORAL
  Filled 2017-11-23: qty 1

## 2017-11-23 MED ORDER — DEXAMETHASONE SODIUM PHOSPHATE 10 MG/ML IJ SOLN
INTRAMUSCULAR | Status: AC
  Filled 2017-11-23: qty 1

## 2017-11-23 MED ORDER — ACETAMINOPHEN 500 MG PO TABS: 500.0000 mg | ORAL_TABLET | Freq: Four times a day (QID) | ORAL | Status: DC | PRN

## 2017-11-23 MED ORDER — SODIUM CHLORIDE 0.9% FLUSH
3.0000 mL | Freq: Two times a day (BID) | INTRAVENOUS | Status: DC
  Administered 2017-11-23: 3 mL via INTRAVENOUS

## 2017-11-23 MED ORDER — DOXYCYCLINE HYCLATE 100 MG PO TABS
100.0000 mg | ORAL_TABLET | Freq: Two times a day (BID) | ORAL | Status: DC
  Administered 2017-11-23 – 2017-11-24 (×2): 100 mg via ORAL
  Filled 2017-11-23 (×2): qty 1

## 2017-11-23 MED ORDER — QUETIAPINE FUMARATE 25 MG PO TABS
50.0000 mg | ORAL_TABLET | Freq: Every day | ORAL | Status: DC
  Administered 2017-11-23: 50 mg via ORAL
  Filled 2017-11-23: qty 2

## 2017-11-23 MED ORDER — ONDANSETRON HCL 4 MG/2ML IJ SOLN: 4.0000 mg | Freq: Once | INTRAMUSCULAR | Status: DC | PRN

## 2017-11-23 MED ORDER — FENTANYL CITRATE (PF) 100 MCG/2ML IJ SOLN
INTRAMUSCULAR | Status: DC | PRN
  Administered 2017-11-23: 25 ug via INTRAVENOUS

## 2017-11-23 MED ORDER — CIPROFLOXACIN IN D5W 400 MG/200ML IV SOLN
400.0000 mg | Freq: Once | INTRAVENOUS | Status: AC
  Administered 2017-11-23: 400 mg via INTRAVENOUS
  Filled 2017-11-23: qty 200

## 2017-11-23 MED ORDER — LISINOPRIL-HYDROCHLOROTHIAZIDE 10-12.5 MG PO TABS: 1.0000 | ORAL_TABLET | Freq: Every day | ORAL | Status: DC

## 2017-11-23 MED ORDER — SODIUM CHLORIDE 0.9% FLUSH: 3.0000 mL | INTRAVENOUS | Status: DC | PRN

## 2017-11-23 MED ORDER — FENTANYL CITRATE (PF) 100 MCG/2ML IJ SOLN: 25.0000 ug | INTRAMUSCULAR | Status: DC | PRN

## 2017-11-23 MED ORDER — LACTATED RINGERS IV SOLN: INTRAVENOUS | Status: DC

## 2017-11-23 MED ORDER — LISINOPRIL 10 MG PO TABS
10.0000 mg | ORAL_TABLET | Freq: Every day | ORAL | Status: DC
  Administered 2017-11-24: 10 mg via ORAL
  Filled 2017-11-23: qty 1

## 2017-11-23 MED ORDER — CITALOPRAM HYDROBROMIDE 20 MG PO TABS
20.0000 mg | ORAL_TABLET | Freq: Every day | ORAL | Status: DC
  Administered 2017-11-24: 20 mg via ORAL
  Filled 2017-11-23: qty 1

## 2017-11-23 MED ORDER — PROPOFOL 10 MG/ML IV BOLUS
INTRAVENOUS | Status: AC
  Filled 2017-11-23: qty 20

## 2017-11-23 MED ORDER — LIDOCAINE 2% (20 MG/ML) 5 ML SYRINGE
INTRAMUSCULAR | Status: AC
  Filled 2017-11-23: qty 5

## 2017-11-23 MED ORDER — CARBIDOPA-LEVODOPA 25-100 MG PO TABS
1.5000 | ORAL_TABLET | Freq: Three times a day (TID) | ORAL | Status: DC
  Administered 2017-11-23 – 2017-11-24 (×2): 1.5 via ORAL
  Filled 2017-11-23 (×2): qty 2

## 2017-11-23 MED ORDER — APIXABAN 5 MG PO TABS
5.0000 mg | ORAL_TABLET | Freq: Two times a day (BID) | ORAL | Status: DC
  Administered 2017-11-23 – 2017-11-24 (×2): 5 mg via ORAL
  Filled 2017-11-23 (×2): qty 1

## 2017-11-23 MED ORDER — PROPOFOL 10 MG/ML IV BOLUS
INTRAVENOUS | Status: DC | PRN
  Administered 2017-11-23: 60 mg via INTRAVENOUS

## 2017-11-23 SURGICAL SUPPLY — 13 items
BAG URO CATCHER STRL LF (MISCELLANEOUS) ×3 IMPLANT
CATH INTERMIT  6FR 70CM (CATHETERS) ×3 IMPLANT
CLOTH BEACON ORANGE TIMEOUT ST (SAFETY) ×3 IMPLANT
COVER FOOTSWITCH UNIV (MISCELLANEOUS) IMPLANT
COVER SURGICAL LIGHT HANDLE (MISCELLANEOUS) ×3 IMPLANT
GLOVE BIOGEL M STRL SZ7.5 (GLOVE) ×3 IMPLANT
GOWN STRL REUS W/TWL LRG LVL3 (GOWN DISPOSABLE) ×6 IMPLANT
GUIDEWIRE STR DUAL SENSOR (WIRE) ×3 IMPLANT
MANIFOLD NEPTUNE II (INSTRUMENTS) ×3 IMPLANT
PACK CYSTO (CUSTOM PROCEDURE TRAY) ×3 IMPLANT
STENT URO INLAY 6FRX24CM (STENTS) ×2 IMPLANT
TUBING CONNECTING 10 (TUBING) ×2 IMPLANT
TUBING CONNECTING 10' (TUBING) ×1

## 2017-11-23 NOTE — Anesthesia Procedure Notes (Signed)
Procedure Name: LMA Insertion Date/Time: 11/23/2017 4:21 PM Performed by: Anne Fu, CRNA Pre-anesthesia Checklist: Patient identified, Emergency Drugs available, Suction available, Patient being monitored and Timeout performed Patient Re-evaluated:Patient Re-evaluated prior to induction Oxygen Delivery Method: Circle system utilized Preoxygenation: Pre-oxygenation with 100% oxygen Induction Type: IV induction Ventilation: Mask ventilation without difficulty LMA: LMA inserted LMA Size: 4.0 Number of attempts: 1 Placement Confirmation: positive ETCO2 and breath sounds checked- equal and bilateral Tube secured with: Tape

## 2017-11-23 NOTE — Plan of Care (Signed)
Patient admitted to [facility] from PACU post R ureteral stent placement.  Patient comfortable, VSS.  Family at bedside.  Patient is total care per family, lives at home with wife.

## 2017-11-23 NOTE — Anesthesia Postprocedure Evaluation (Signed)
Anesthesia Post Note  Patient: [name]  Procedure(s) Performed: CYSTOSCOPY REMOVAL AND  STENT PLACEMENT (Right Ureter)     Patient location during evaluation: PACU Anesthesia Type: General Level of consciousness: awake and alert Pain management: pain level controlled Vital Signs Assessment: post-procedure vital signs reviewed and stable Respiratory status: spontaneous breathing, nonlabored ventilation, respiratory function stable and patient connected to nasal cannula oxygen Cardiovascular status: blood pressure returned to baseline and stable Postop Assessment: no apparent nausea or vomiting Anesthetic complications: no    Last Vitals:  Vitals:   11/23/17 1810 11/23/17 2039  BP: (!) 149/68 124/69  Pulse: 73 80  Resp: 18 14  Temp: 36.6 C 36.7 C  SpO2: 100% 99%    Last Pain:  Vitals:   11/23/17 2039  TempSrc: Oral  PainSc:                  Karyl Kinnier Ellender

## 2017-11-23 NOTE — Op Note (Signed)
Preoperative diagnosis:  1. Right ureteral obstruction 2. Bladder cancer   Postoperative diagnosis:  1. Right ureteral obstruction 2. Bladder cancer   Procedure:  1. Cystoscopy 2. Right ureteral stent placement (6 x 24 - Bard Inlay Optima)  Surgeon: Roxy Horseman, Brooke Bonito. M.D.  Anesthesia: General  Complications: None  Intraoperative findings: Stent was severely encrusted  EBL: Minimal  Specimens: None  Indication: Brian Montgomery is a 82 y.o. patient with right ureteral obstruction. After reviewing the management options for treatment, he elected to proceed with the above surgical procedure(s). We have discussed the potential benefits and risks of the procedure, side effects of the proposed treatment, the likelihood of the patient achieving the goals of the procedure, and any potential problems that might occur during the procedure or recuperation. Informed consent has been obtained.  Description of procedure:  The patient was taken to the operating room and general anesthesia was induced.  The patient was placed in the dorsal lithotomy position, prepped and draped in the usual sterile fashion, and preoperative antibiotics were administered. A preoperative time-out was performed.   Cystourethroscopy was performed.  The patient's urethra was examined and was unremarkable. The bladder was then systematically examined in its entirety. There was no evidence for any bladder tumors, stones, or other mucosal pathology.    Attention then turned to the right ureteral orifice and the patient's indwelling ureteral stent was identified and brought out to the urethral meatus with the flexible graspers.  A 0.38 sensor guidewire was then advanced up the right ureter into the renal pelvis under fluoroscopic guidance.  The wire was then backloaded through the cystoscope and a ureteral stent was advance over the wire using Seldinger technique.  The stent was positioned appropriately under fluoroscopic  and cystoscopic guidance.  The wire was then removed with an adequate stent curl noted in the renal pelvis as well as in the bladder.  The bladder was then emptied and the procedure ended.  The patient appeared to tolerate the procedure well and without complications.  The patient was able to be awakened and transferred to the recovery unit in satisfactory condition.    Pryor Curia MD

## 2017-11-23 NOTE — Anesthesia Preprocedure Evaluation (Addendum)
Anesthesia Evaluation  Patient identified by MRN, date of birth, ID band Patient awake    Reviewed: Allergy & Precautions, NPO status , Patient's Chart, lab work & pertinent test results  Airway Mallampati: I  TM Distance: >3 FB Neck ROM: Full    Dental  (+) Missing,    Pulmonary former smoker,    Pulmonary exam normal breath sounds clear to auscultation       Cardiovascular hypertension, Pt. on medications Normal cardiovascular exam Rhythm:Regular Rate:Normal  ECG: NSR, rate 62  ECHO: LV EF: 60% -   65%   Neuro/Psych PSYCHIATRIC DISORDERS Dementia Parkinson disease super nuclear palsy   Neuromuscular disease    GI/Hepatic negative GI ROS, Neg liver ROS,   Endo/Other  negative endocrine ROS  Renal/GU Renal disease     Musculoskeletal negative musculoskeletal ROS (+)   Abdominal   Peds  Hematology  (+) anemia , HLD   Anesthesia Other Findings RIGHT URETERAL OBSTRUCTION BLADDER CANCER Wheelchair bound  Reproductive/Obstetrics                         Anesthesia Physical Anesthesia Plan  ASA: III  Anesthesia Plan: General   Post-op Pain Management:    Induction: Intravenous  PONV Risk Score and Plan: 2 and Ondansetron, Dexamethasone and Treatment may vary due to age or medical condition  Airway Management Planned: LMA  Additional Equipment:   Intra-op Plan:   Post-operative Plan: Extubation in OR  Informed Consent: I have reviewed the patients History and Physical, chart, labs and discussed the procedure including the risks, benefits and alternatives for the proposed anesthesia with the patient or authorized representative who has indicated his/her understanding and acceptance.   Dental advisory given  Plan Discussed with: CRNA  Anesthesia Plan Comments:         Anesthesia Quick Evaluation                                  Anesthesia Evaluation  Patient identified by  MRN, date of birth, ID band Patient awake    Reviewed: Allergy & Precautions, NPO status , Patient's Chart, lab work & pertinent test results  Airway Mallampati: II  TM Distance: >3 FB Neck ROM: Full    Dental  (+) Edentulous Upper, Dental Advisory Given   Pulmonary former smoker,    breath sounds clear to auscultation       Cardiovascular hypertension, Pt. on medications  Rhythm:Regular Rate:Normal  ECG: ST, rate 103   Neuro/Psych Parkinson disease  Dementia negative psych ROS   GI/Hepatic negative GI ROS, Neg liver ROS,   Endo/Other  negative endocrine ROS  Renal/GU Renal disease   RIGHT URETERAL OBSTRUCTION BLADDER CANCER    Musculoskeletal   Abdominal   Peds  Hematology  (+) anemia , Dyslipidemia   Anesthesia Other Findings Wheelchair bound  Reproductive/Obstetrics                            Anesthesia Physical  Anesthesia Plan  ASA: III  Anesthesia Plan: General   Post-op Pain Management:    Induction: Intravenous  PONV Risk Score and Plan: 2 and Dexamethasone, Ondansetron and Treatment may vary due to age or medical condition  Airway Management Planned: LMA  Additional Equipment:   Intra-op Plan:   Post-operative Plan: Extubation in OR  Informed Consent: I have reviewed the patients History  and Physical, chart, labs and discussed the procedure including the risks, benefits and alternatives for the proposed anesthesia with the patient or authorized representative who has indicated his/her understanding and acceptance.   Dental advisory given  Plan Discussed with: CRNA  Anesthesia Plan Comments:         Anesthesia Quick Evaluation

## 2017-11-23 NOTE — Transfer of Care (Signed)
Immediate Anesthesia Transfer of Care Note  Patient: Brian Montgomery  Procedure(s) Performed: Procedure(s): CYSTOSCOPY REMOVAL AND  STENT PLACEMENT (Right)  Patient Location: PACU  Anesthesia Type:General  Level of Consciousness:  sedated, patient cooperative and responds to stimulation  Airway & Oxygen Therapy:Patient Spontanous Breathing and Patient connected to face mask oxgen  Post-op Assessment:  Report given to PACU RN and Post -op Vital signs reviewed and stable  Post vital signs:  Reviewed and stable  Last Vitals:  Vitals:   11/23/17 1315 11/23/17 1704  BP: 114/65 (!) 130/59  Pulse:  (!) 59  Resp: 18 18  Temp: 36.6 C   SpO2: 902% 409%    Complications: No apparent anesthesia complications

## 2017-11-23 NOTE — Discharge Instructions (Signed)

## 2017-11-24 ENCOUNTER — Encounter (HOSPITAL_COMMUNITY): Payer: Self-pay | Admitting: Urology

## 2017-11-24 DIAGNOSIS — Z87891 Personal history of nicotine dependence: Secondary | ICD-10-CM | POA: Diagnosis not present

## 2017-11-24 DIAGNOSIS — Z7901 Long term (current) use of anticoagulants: Secondary | ICD-10-CM | POA: Diagnosis not present

## 2017-11-24 DIAGNOSIS — I10 Essential (primary) hypertension: Secondary | ICD-10-CM | POA: Diagnosis not present

## 2017-11-24 DIAGNOSIS — N135 Crossing vessel and stricture of ureter without hydronephrosis: Secondary | ICD-10-CM | POA: Diagnosis not present

## 2017-11-24 DIAGNOSIS — M1990 Unspecified osteoarthritis, unspecified site: Secondary | ICD-10-CM | POA: Diagnosis not present

## 2017-11-24 DIAGNOSIS — H04123 Dry eye syndrome of bilateral lacrimal glands: Secondary | ICD-10-CM | POA: Diagnosis not present

## 2017-11-24 NOTE — Discharge Summary (Signed)
Date of admission: 11/23/2017  Date of discharge: 11/24/2017  Admission diagnosis: Right ureteral obstruction  Discharge diagnosis: Right ureteral obstruction  Secondary diagnoses: Bladder cancer, Demetia, CKD  History and Physical: For full details, please see admission history and physical. Briefly, Brian Montgomery is a 82 y.o. year old patient with right ureteral obstruction with history of bladder cancer s/p radiation therapy.   Hospital Course: He underwent cystoscopy and right ureteral stent change on 11/23/17.  He was admitted for observation overnight considering his frailty.  He remained stable and was able to be discharged home on POD #1.  Laboratory values: No results for input(s): HGB, HCT in the last 72 hours. No results for input(s): CREATININE in the last 72 hours.  Disposition: Home  Discharge medications:  Allergies as of 11/24/2017   No Known Allergies     Medication List    TAKE these medications   acetaminophen 500 MG tablet Commonly known as:  TYLENOL Take 500 mg by mouth every 6 (six) hours as needed for moderate pain or headache.   apixaban 5 MG Tabs tablet Commonly known as:  ELIQUIS Take 1 tablet (5 mg total) by mouth 2 (two) times daily.   carbidopa-levodopa 25-100 MG tablet Commonly known as:  SINEMET IR Take 1.5 tablets by mouth 3 (three) times daily.   citalopram 20 MG tablet Commonly known as:  CELEXA TAKE 1 TABLET(20 MG) BY MOUTH DAILY   donepezil 10 MG tablet Commonly known as:  ARICEPT TAKE 1 TABLET(10 MG) BY MOUTH AT BEDTIME   doxycycline 100 MG tablet Commonly known as:  VIBRA-TABS Take 100 mg by mouth 2 (two) times daily. To start medication on 11/20/2017 for three days before surgery   DRY EYES OP Place 1 drop into both eyes as needed (for dry eyes).   lisinopril-hydrochlorothiazide 10-12.5 MG tablet Commonly known as:  PRINZIDE,ZESTORETIC Take 1 tablet by mouth daily.   Melatonin 3 MG Caps Take 3 mg by mouth every other day as  needed (for sleep.).   pseudoephedrine-guaifenesin 60-600 MG 12 hr tablet Commonly known as:  MUCINEX D Take 1 tablet by mouth at bedtime as needed for congestion.   QUEtiapine 50 MG tablet Commonly known as:  SEROQUEL Take 1 tablet (50 mg total) by mouth at bedtime.   simvastatin 20 MG tablet Commonly known as:  ZOCOR TAKE 1 TABLET BY MOUTH DAILY       Followup:  Follow-up Information    Raynelle Bring, MD.   Specialty:  Urology Why:  02/23/18 Contact information: [address] [place] [address] [phone]

## 2017-11-24 NOTE — Progress Notes (Signed)
Patient ID: Brian Montgomery, male   DOB: 14-Jan-1936, 82 y.o.   MRN: [id number]  1 Day Post-Op Subjective: Doing well.  No complaints.  Objective: Vital signs in last 24 hours: Temp:  [97.5 F (36.4 C)-98 F (36.7 C)] 97.5 F (36.4 C) (05/17 0440) Pulse Rate:  [59-87] 87 (05/17 0440) Resp:  [14-21] 18 (05/17 0440) BP: (95-149)/(59-79) 95/61 (05/17 0440) SpO2:  [99 %-100 %] 99 % (05/17 0440) Weight:  [68.3 kg (150 lb 8 oz)-70 kg (154 lb 5.2 oz)] 70 kg (154 lb 5.2 oz) (05/16 1810)  Intake/Output from previous day: 05/16 0701 - 05/17 0700 In: 300 [I.V.:300] Out: 5 [Blood:5] Intake/Output this shift: No intake/output data recorded.  Physical Exam:  General: Alert and oriented Abd: Soft, NT  Lab Results:  Studies/Results:   Assessment/Plan: D/C home   LOS: 0 days   Kristell Wooding,LES 11/24/2017, 7:35 AM

## 2017-11-28 DIAGNOSIS — G609 Hereditary and idiopathic neuropathy, unspecified: Secondary | ICD-10-CM | POA: Diagnosis not present

## 2017-11-28 DIAGNOSIS — N183 Chronic kidney disease, stage 3 (moderate): Secondary | ICD-10-CM | POA: Diagnosis not present

## 2017-11-28 DIAGNOSIS — I129 Hypertensive chronic kidney disease with stage 1 through stage 4 chronic kidney disease, or unspecified chronic kidney disease: Secondary | ICD-10-CM | POA: Diagnosis not present

## 2017-11-28 DIAGNOSIS — I824Y2 Acute embolism and thrombosis of unspecified deep veins of left proximal lower extremity: Secondary | ICD-10-CM | POA: Diagnosis not present

## 2017-11-28 DIAGNOSIS — G231 Progressive supranuclear ophthalmoplegia [Steele-Richardson-Olszewski]: Secondary | ICD-10-CM | POA: Diagnosis not present

## 2017-11-28 DIAGNOSIS — F0390 Unspecified dementia without behavioral disturbance: Secondary | ICD-10-CM | POA: Diagnosis not present

## 2017-11-29 ENCOUNTER — Other Ambulatory Visit: Payer: Self-pay | Admitting: Family Medicine

## 2017-11-30 DIAGNOSIS — N183 Chronic kidney disease, stage 3 (moderate): Secondary | ICD-10-CM | POA: Diagnosis not present

## 2017-11-30 DIAGNOSIS — F0390 Unspecified dementia without behavioral disturbance: Secondary | ICD-10-CM | POA: Diagnosis not present

## 2017-11-30 DIAGNOSIS — I824Y2 Acute embolism and thrombosis of unspecified deep veins of left proximal lower extremity: Secondary | ICD-10-CM | POA: Diagnosis not present

## 2017-11-30 DIAGNOSIS — G231 Progressive supranuclear ophthalmoplegia [Steele-Richardson-Olszewski]: Secondary | ICD-10-CM | POA: Diagnosis not present

## 2017-11-30 DIAGNOSIS — I129 Hypertensive chronic kidney disease with stage 1 through stage 4 chronic kidney disease, or unspecified chronic kidney disease: Secondary | ICD-10-CM | POA: Diagnosis not present

## 2017-11-30 DIAGNOSIS — G609 Hereditary and idiopathic neuropathy, unspecified: Secondary | ICD-10-CM | POA: Diagnosis not present

## 2017-12-22 ENCOUNTER — Telehealth: Payer: Self-pay | Admitting: Neurology

## 2017-12-22 NOTE — Telephone Encounter (Signed)
Called to let daughter know that Dr. [name] would not be the right doctor to do this. Patient would have to have a competency evaluation and be deemed incompetent. She expressed understanding.  Dr. [name]- FYI.

## 2017-12-27 ENCOUNTER — Other Ambulatory Visit: Payer: Self-pay | Admitting: Neurology

## 2018-01-08 ENCOUNTER — Emergency Department (HOSPITAL_COMMUNITY): Payer: Medicare Other

## 2018-01-08 ENCOUNTER — Encounter (HOSPITAL_COMMUNITY): Payer: Self-pay

## 2018-01-08 ENCOUNTER — Inpatient Hospital Stay (HOSPITAL_COMMUNITY)
Admission: EM | Admit: 2018-01-08 | Discharge: 2018-01-12 | DRG: 178 | Disposition: A | Discharge status: Skilled Nursing Facility | Payer: Medicare Other | Attending: Internal Medicine | Admitting: Internal Medicine

## 2018-01-08 ENCOUNTER — Other Ambulatory Visit: Payer: Self-pay

## 2018-01-08 DIAGNOSIS — E861 Hypovolemia: Secondary | ICD-10-CM | POA: Diagnosis present

## 2018-01-08 DIAGNOSIS — Z8249 Family history of ischemic heart disease and other diseases of the circulatory system: Secondary | ICD-10-CM | POA: Diagnosis not present

## 2018-01-08 DIAGNOSIS — N189 Chronic kidney disease, unspecified: Secondary | ICD-10-CM

## 2018-01-08 DIAGNOSIS — D649 Anemia, unspecified: Secondary | ICD-10-CM

## 2018-01-08 DIAGNOSIS — F0281 Dementia in other diseases classified elsewhere with behavioral disturbance: Secondary | ICD-10-CM | POA: Diagnosis not present

## 2018-01-08 DIAGNOSIS — Z993 Dependence on wheelchair: Secondary | ICD-10-CM

## 2018-01-08 DIAGNOSIS — G20 Parkinson's disease: Secondary | ICD-10-CM | POA: Diagnosis not present

## 2018-01-08 DIAGNOSIS — D631 Anemia in chronic kidney disease: Secondary | ICD-10-CM | POA: Diagnosis present

## 2018-01-08 DIAGNOSIS — M1990 Unspecified osteoarthritis, unspecified site: Secondary | ICD-10-CM | POA: Diagnosis present

## 2018-01-08 DIAGNOSIS — Z85528 Personal history of other malignant neoplasm of kidney: Secondary | ICD-10-CM | POA: Diagnosis not present

## 2018-01-08 DIAGNOSIS — I10 Essential (primary) hypertension: Secondary | ICD-10-CM | POA: Diagnosis not present

## 2018-01-08 DIAGNOSIS — G231 Progressive supranuclear ophthalmoplegia [Steele-Richardson-Olszewski]: Secondary | ICD-10-CM | POA: Diagnosis present

## 2018-01-08 DIAGNOSIS — J690 Pneumonitis due to inhalation of food and vomit: Secondary | ICD-10-CM | POA: Diagnosis not present

## 2018-01-08 DIAGNOSIS — Z86718 Personal history of other venous thrombosis and embolism: Secondary | ICD-10-CM | POA: Diagnosis not present

## 2018-01-08 DIAGNOSIS — E870 Hyperosmolality and hypernatremia: Secondary | ICD-10-CM | POA: Diagnosis present

## 2018-01-08 DIAGNOSIS — Z87891 Personal history of nicotine dependence: Secondary | ICD-10-CM

## 2018-01-08 DIAGNOSIS — Z515 Encounter for palliative care: Secondary | ICD-10-CM

## 2018-01-08 DIAGNOSIS — Z7189 Other specified counseling: Secondary | ICD-10-CM

## 2018-01-08 DIAGNOSIS — J189 Pneumonia, unspecified organism: Secondary | ICD-10-CM

## 2018-01-08 DIAGNOSIS — Z8551 Personal history of malignant neoplasm of bladder: Secondary | ICD-10-CM | POA: Diagnosis not present

## 2018-01-08 DIAGNOSIS — Z841 Family history of disorders of kidney and ureter: Secondary | ICD-10-CM | POA: Diagnosis not present

## 2018-01-08 DIAGNOSIS — I82409 Acute embolism and thrombosis of unspecified deep veins of unspecified lower extremity: Secondary | ICD-10-CM

## 2018-01-08 DIAGNOSIS — R410 Disorientation, unspecified: Secondary | ICD-10-CM | POA: Diagnosis not present

## 2018-01-08 DIAGNOSIS — J181 Lobar pneumonia, unspecified organism: Secondary | ICD-10-CM | POA: Diagnosis not present

## 2018-01-08 DIAGNOSIS — Z66 Do not resuscitate: Secondary | ICD-10-CM | POA: Diagnosis not present

## 2018-01-08 DIAGNOSIS — N183 Chronic kidney disease, stage 3 unspecified: Secondary | ICD-10-CM | POA: Diagnosis present

## 2018-01-08 DIAGNOSIS — R2689 Other abnormalities of gait and mobility: Secondary | ICD-10-CM | POA: Diagnosis not present

## 2018-01-08 DIAGNOSIS — E785 Hyperlipidemia, unspecified: Secondary | ICD-10-CM | POA: Diagnosis present

## 2018-01-08 DIAGNOSIS — F0391 Unspecified dementia with behavioral disturbance: Secondary | ICD-10-CM | POA: Diagnosis not present

## 2018-01-08 DIAGNOSIS — M6281 Muscle weakness (generalized): Secondary | ICD-10-CM | POA: Diagnosis not present

## 2018-01-08 DIAGNOSIS — R488 Other symbolic dysfunctions: Secondary | ICD-10-CM | POA: Diagnosis not present

## 2018-01-08 DIAGNOSIS — R402441 Other coma, without documented Glasgow coma scale score, or with partial score reported, in the field [EMT or ambulance]: Secondary | ICD-10-CM | POA: Diagnosis not present

## 2018-01-08 DIAGNOSIS — Z7901 Long term (current) use of anticoagulants: Secondary | ICD-10-CM

## 2018-01-08 DIAGNOSIS — Z7401 Bed confinement status: Secondary | ICD-10-CM | POA: Diagnosis not present

## 2018-01-08 DIAGNOSIS — R1312 Dysphagia, oropharyngeal phase: Secondary | ICD-10-CM | POA: Diagnosis not present

## 2018-01-08 DIAGNOSIS — N179 Acute kidney failure, unspecified: Secondary | ICD-10-CM | POA: Diagnosis not present

## 2018-01-08 DIAGNOSIS — F0280 Dementia in other diseases classified elsewhere without behavioral disturbance: Secondary | ICD-10-CM | POA: Diagnosis present

## 2018-01-08 DIAGNOSIS — Z923 Personal history of irradiation: Secondary | ICD-10-CM

## 2018-01-08 DIAGNOSIS — Z79899 Other long term (current) drug therapy: Secondary | ICD-10-CM

## 2018-01-08 DIAGNOSIS — R531 Weakness: Secondary | ICD-10-CM | POA: Diagnosis not present

## 2018-01-08 DIAGNOSIS — R4182 Altered mental status, unspecified: Secondary | ICD-10-CM | POA: Diagnosis not present

## 2018-01-08 DIAGNOSIS — M2550 Pain in unspecified joint: Secondary | ICD-10-CM | POA: Diagnosis not present

## 2018-01-08 DIAGNOSIS — I129 Hypertensive chronic kidney disease with stage 1 through stage 4 chronic kidney disease, or unspecified chronic kidney disease: Secondary | ICD-10-CM | POA: Diagnosis present

## 2018-01-08 DIAGNOSIS — F0390 Unspecified dementia without behavioral disturbance: Secondary | ICD-10-CM | POA: Diagnosis present

## 2018-01-08 DIAGNOSIS — N289 Disorder of kidney and ureter, unspecified: Secondary | ICD-10-CM | POA: Diagnosis not present

## 2018-01-08 DIAGNOSIS — R278 Other lack of coordination: Secondary | ICD-10-CM | POA: Diagnosis not present

## 2018-01-08 DIAGNOSIS — R52 Pain, unspecified: Secondary | ICD-10-CM | POA: Diagnosis not present

## 2018-01-08 LAB — COMPREHENSIVE METABOLIC PANEL
ALT: <5 U/L (ref 0–44)
AST: 14 U/L — ABNORMAL LOW (ref 15–41)
Albumin: 3.7 g/dL (ref 3.5–5.0)
Alkaline Phosphatase: 65 U/L (ref 38–126)
Anion gap: 11 (ref 5–15)
BILIRUBIN TOTAL: 0.5 mg/dL (ref 0.3–1.2)
BUN: 31 mg/dL — AB (ref 8–23)
CO2: 17 mmol/L — ABNORMAL LOW (ref 22–32)
CREATININE: 1.92 mg/dL — AB (ref 0.61–1.24)
Calcium: 9.3 mg/dL (ref 8.9–10.3)
Chloride: 117 mmol/L — ABNORMAL HIGH (ref 98–111)
GFR calc Af Amer: 36 mL/min — ABNORMAL LOW (ref >60)
GFR, EST NON AFRICAN AMERICAN: 31 mL/min — AB (ref >60)
GLUCOSE: 111 mg/dL — AB (ref 70–99)
POTASSIUM: 3.7 mmol/L (ref 3.5–5.1)
Sodium: 145 mmol/L (ref 135–145)
Total Protein: 7.3 g/dL (ref 6.5–8.1)

## 2018-01-08 LAB — URINALYSIS, COMPLETE (UACMP) WITH MICROSCOPIC
Bilirubin Urine: NEGATIVE
Glucose, UA: NEGATIVE mg/dL
Ketones, ur: NEGATIVE mg/dL
Leukocytes, UA: NEGATIVE
NITRITE: NEGATIVE
PH: 5 (ref 5.0–8.0)
Protein, ur: 100 mg/dL — AB
RBC / HPF: >50 RBC/hpf — ABNORMAL HIGH (ref 0–5)
SPECIFIC GRAVITY, URINE: 1.015 (ref 1.005–1.030)

## 2018-01-08 LAB — CBC
HEMATOCRIT: 27.1 % — AB (ref 39.0–52.0)
Hemoglobin: 8.9 g/dL — ABNORMAL LOW (ref 13.0–17.0)
MCH: 29.7 pg (ref 26.0–34.0)
MCHC: 32.8 g/dL (ref 30.0–36.0)
MCV: 90.3 fL (ref 78.0–100.0)
PLATELETS: 235 10*3/uL (ref 150–400)
RBC: 3 MIL/uL — ABNORMAL LOW (ref 4.22–5.81)
RDW: 14.1 % (ref 11.5–15.5)
WBC: 6.7 10*3/uL (ref 4.0–10.5)

## 2018-01-08 LAB — PROCALCITONIN: Procalcitonin: <0.1 ng/mL

## 2018-01-08 LAB — BRAIN NATRIURETIC PEPTIDE: B NATRIURETIC PEPTIDE 5: 18.9 pg/mL (ref 0.0–100.0)

## 2018-01-08 LAB — TROPONIN I

## 2018-01-08 LAB — CBG MONITORING, ED: Glucose-Capillary: 103 mg/dL — ABNORMAL HIGH (ref 70–99)

## 2018-01-08 MED ORDER — APIXABAN 5 MG PO TABS
5.0000 mg | ORAL_TABLET | Freq: Two times a day (BID) | ORAL | Status: DC
  Administered 2018-01-08 – 2018-01-12 (×7): 5 mg via ORAL
  Filled 2018-01-08 (×7): qty 1

## 2018-01-08 MED ORDER — SIMVASTATIN 20 MG PO TABS
20.0000 mg | ORAL_TABLET | Freq: Every day | ORAL | Status: DC
  Administered 2018-01-09 – 2018-01-11 (×3): 20 mg via ORAL
  Filled 2018-01-08: qty 2
  Filled 2018-01-08 (×3): qty 1
  Filled 2018-01-08 (×2): qty 2
  Filled 2018-01-08: qty 1

## 2018-01-08 MED ORDER — SODIUM CHLORIDE 0.9 % IV SOLN
INTRAVENOUS | Status: DC
  Administered 2018-01-09: 04:00:00 via INTRAVENOUS

## 2018-01-08 MED ORDER — CEFTRIAXONE SODIUM 1 G IJ SOLR
1.0000 g | Freq: Once | INTRAMUSCULAR | Status: AC
  Administered 2018-01-08: 1 g via INTRAVENOUS
  Filled 2018-01-08: qty 10

## 2018-01-08 MED ORDER — QUETIAPINE FUMARATE 50 MG PO TABS
50.0000 mg | ORAL_TABLET | Freq: Every day | ORAL | Status: DC
  Administered 2018-01-08 – 2018-01-11 (×4): 50 mg via ORAL
  Filled 2018-01-08: qty 2
  Filled 2018-01-08 (×2): qty 1
  Filled 2018-01-08: qty 2
  Filled 2018-01-08 (×2): qty 1
  Filled 2018-01-08 (×2): qty 2

## 2018-01-08 MED ORDER — SODIUM CHLORIDE 0.9 % IV BOLUS
500.0000 mL | Freq: Once | INTRAVENOUS | Status: AC
  Administered 2018-01-08: 500 mL via INTRAVENOUS

## 2018-01-08 MED ORDER — SODIUM CHLORIDE 0.9 % IV SOLN
3.0000 g | Freq: Two times a day (BID) | INTRAVENOUS | Status: DC
  Administered 2018-01-08 – 2018-01-09 (×2): 3 g via INTRAVENOUS
  Administered 2018-01-09: 22:00:00 via INTRAVENOUS
  Filled 2018-01-08 (×4): qty 3

## 2018-01-08 MED ORDER — CARBIDOPA-LEVODOPA 25-100 MG PO TABS
1.5000 | ORAL_TABLET | Freq: Three times a day (TID) | ORAL | Status: DC
  Administered 2018-01-08 – 2018-01-12 (×10): 1.5 via ORAL
  Filled 2018-01-08 (×10): qty 2

## 2018-01-08 MED ORDER — SODIUM CHLORIDE 0.9 % IV SOLN
500.0000 mg | Freq: Once | INTRAVENOUS | Status: AC
  Administered 2018-01-08: 500 mg via INTRAVENOUS
  Filled 2018-01-08: qty 500

## 2018-01-08 MED ORDER — SENNOSIDES-DOCUSATE SODIUM 8.6-50 MG PO TABS: 1.0000 | ORAL_TABLET | Freq: Every evening | ORAL | Status: DC | PRN

## 2018-01-08 MED ORDER — ACETAMINOPHEN 650 MG RE SUPP: 650.0000 mg | Freq: Four times a day (QID) | RECTAL | Status: DC | PRN

## 2018-01-08 MED ORDER — GUAIFENESIN-DM 100-10 MG/5ML PO SYRP: 5.0000 mL | ORAL_SOLUTION | ORAL | Status: DC | PRN

## 2018-01-08 MED ORDER — ONDANSETRON HCL 4 MG/2ML IJ SOLN: 4.0000 mg | Freq: Four times a day (QID) | INTRAMUSCULAR | Status: DC | PRN

## 2018-01-08 MED ORDER — ONDANSETRON HCL 4 MG PO TABS: 4.0000 mg | ORAL_TABLET | Freq: Four times a day (QID) | ORAL | Status: DC | PRN

## 2018-01-08 MED ORDER — ACETAMINOPHEN 325 MG PO TABS: 650.0000 mg | ORAL_TABLET | Freq: Four times a day (QID) | ORAL | Status: DC | PRN

## 2018-01-08 MED ORDER — AZITHROMYCIN 250 MG PO TABS
500.0000 mg | ORAL_TABLET | Freq: Once | ORAL | Status: DC
  Filled 2018-01-08: qty 2

## 2018-01-08 NOTE — ED Notes (Signed)
Bed: PS88 Expected date:  Expected time:  Means of arrival:  Comments: EMS-altered

## 2018-01-08 NOTE — H&P (Signed)
History and Physical    Jatavian Calica VZD:[id number] DOB: 26-Jul-1935  DOA: 01/08/2018 PCP: Denita Lung, MD  Patient coming from: Home  Chief Complaint: Confusion  HPI: Brian Montgomery is a 82 y.o. male with medical history significant of hypertension, CKD stage III, bladder and kidney cancer, DVT on Xarelto, dementia (completely dependent for daily activities) and PSP.  Patient was brought by EMS after family found patient to be more confused than baseline.  Per family members patient developed shortness of breath 2 days ago associated with nonproductive cough.  Patient report having difficulty breathing but no other symptoms.  Denies fever, chills and chest pain.  Family member patient has been coughing and choking especially with liquids for a while now.  Denies sick contacts.  ED Course: Upon ED evaluation labs remarkable for creatinine of 1.9 (around baseline), hemoglobin of 8.9, UA with large hemoglobin, chest x-ray shows right lower lobe pneumonia.  Review of Systems:   All system review and negative except the ones mentioned on HPI   Past Medical History:  Diagnosis Date  . Arthritis   . Bladder cancer ([place])    infiltrative high grade papillary urothelial carcinoma   . Dementia    mild   . Dyslipidemia   . ED (erectile dysfunction)   . Hypertension   . 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 Parkinson disease ([place])   . Pneumonia    x1 in adulthood   . Prostate cancer [facility])     Past Surgical History:  Procedure Laterality Date  . APPENDECTOMY    . BACK SURGERY     lumbar ' years ago   . COLONOSCOPY  2007   Dr.[email or web address]  . CYSTOSCOPY N/A 03/22/2016   Procedure: CYSTOSCOPY;  Surgeon: Raynelle Bring, MD;  Location: [facility];  Service: Urology;  Laterality: N/A;  . CYSTOSCOPY W/ RETROGRADES N/A 01/21/2016   Procedure: CYSTOSCOPY WITH TURBT WITH  CLOT EVACUATION;  Surgeon: Raynelle Bring, MD;  [facility];  Service: Urology;  Laterality: N/A;  . CYSTOSCOPY  W/ RETROGRADES N/A 10/20/2016   Procedure: CYSTOSCOPY WITH BILATERAL RETROGRADE PYELOGRAM;  Surgeon: Raynelle Bring, MD;  [facility];  Service: Urology;  Laterality: N/A;  . CYSTOSCOPY W/ URETERAL STENT PLACEMENT Right 06/26/2017   Procedure: CYSTOSCOPY WITH STENT CHANGE;  Surgeon: Raynelle Bring, MD;  [facility];  Service: Urology;  Laterality: Right;  . cystoscopy with stent exchange     06-26-17 Dr Alinda Money  . CYSTOSCOPY WITH STENT PLACEMENT Right 03/16/2017   Procedure: CYSTOSCOPY WITH STENT PLACEMENT;  Surgeon: Raynelle Bring, MD;  [facility];  Service: Urology;  Laterality: Right;  . CYSTOSCOPY WITH STENT PLACEMENT Right 11/23/2017   Procedure: CYSTOSCOPY REMOVAL AND  STENT PLACEMENT;  Surgeon: Raynelle Bring, MD;  [facility];  Service: Urology;  Laterality: Right;  . INTRAMEDULLARY (IM) NAIL INTERTROCHANTERIC Left 07/01/2016   Procedure: INTRAMEDULLARY (IM) NAIL INTERTROCHANTERIC;  Surgeon: Tania Ade, MD;  Location: [place];  Service: Orthopedics;  Laterality: Left;  . left total knee Left 2011  . PROSTATE BIOPSY N/A 03/22/2016   Procedure: BIOPSY TRANSRECTAL ULTRASONIC PROSTATE (TUBP);  Surgeon: Raynelle Bring, MD;  [facility];  Service: Urology;  Laterality: N/A;  . TRANSURETHRAL RESECTION OF BLADDER TUMOR N/A 03/22/2016   Procedure: TRANSURETHRAL RESECTION OF BLADDER TUMOR (TURBT);  Surgeon: Raynelle Bring, MD;  [facility];  Service: Urology;  Laterality: N/A;  . TRANSURETHRAL RESECTION OF BLADDER TUMOR N/A 10/20/2016   Procedure: TRANSURETHRAL  RESECTION OF BLADDER TUMOR (TURBT);  Surgeon: Raynelle Bring, MD;  [facility];  Service: Urology;  Laterality: N/A;  . TRANSURETHRAL RESECTION OF BLADDER TUMOR N/A 03/16/2017   Procedure: TRANSURETHRAL RESECTION OF BLADDER TUMOR (TURBT);  Surgeon: Raynelle Bring, MD;  [facility];  Service: Urology;  Laterality: N/A;     reports that he quit smoking about 50 years ago. His smoking use included cigarettes. He  has a 0.50 pack-year smoking history. He has never used smokeless tobacco. He reports that he does not drink alcohol or use drugs.  No Known Allergies  Family History  Problem Relation Age of Onset  . Heart failure Mother   . Hypertension Mother   . Heart failure Father   . Hypertension Father   . Renal Disease Sister   . Diabetes Brother    Family history reviewed and not pertinent  Prior to Admission medications   Medication Sig Start Date End Date Taking? Authorizing Provider  acetaminophen (TYLENOL) 500 MG tablet Take 500 mg by mouth every 6 (six) hours as needed for moderate pain or headache.   Yes [provider]  apixaban (ELIQUIS) 5 MG TABS tablet Take 1 tablet (5 mg total) by mouth 2 (two) times daily. 11/15/17  Yes Denita Lung, MD  Artificial Tear Ointment (DRY EYES OP) Place 1 drop into both eyes as needed (for dry eyes).    Yes [provider]  carbidopa-levodopa (SINEMET IR) 25-100 MG tablet Take 1.5 tablets by mouth 3 (three) times daily. 03/14/17  Yes Jaffe, Adam R, DO  citalopram (CELEXA) 20 MG tablet TAKE 1 TABLET(20 MG) BY MOUTH DAILY 10/24/17  Yes Denita Lung, MD  lisinopril-hydrochlorothiazide (PRINZIDE,ZESTORETIC) 10-12.5 MG tablet Take 1 tablet by mouth daily. 07/12/17  Yes Denita Lung, MD  Melatonin 3 MG CAPS Take 3 mg by mouth every other day as needed (for sleep.).    Yes [provider]  pseudoephedrine-guaifenesin (MUCINEX D) 60-600 MG 12 hr tablet Take 1 tablet by mouth at bedtime as needed for congestion.    Yes [provider]  QUEtiapine (SEROQUEL) 50 MG tablet TAKE 1 TABLET BY MOUTH AT BEDTIME 12/27/17  Yes Jaffe, Adam R, DO  simvastatin (ZOCOR) 20 MG tablet TAKE 1 TABLET BY MOUTH DAILY 11/29/17  Yes Denita Lung, MD  donepezil (ARICEPT) 10 MG tablet TAKE 1 TABLET(10 MG) BY MOUTH AT BEDTIME Patient not taking: Reported on 11/10/2017 08/28/17   Pieter Partridge, DO    Physical Exam: Vitals:   01/08/18 1018 01/08/18  1023 01/08/18 1030 01/08/18 1230  BP:  107/65 (!) 95/58 132/80  Pulse:  88 87 91  Resp:  18 (!) 25 (!) 27  Temp:  97.8 F (36.6 C)    SpO2:  99% 96% 100%  Weight: 69.9 kg (154 lb)       Constitutional: NAD, calm, comfortable  Eyes: PERRL, lids and conjunctivae normal ENMT: Mucous membranes are moist. Posterior pharynx clear of any exudate or lesions. Neck: normal, supple, no masses, no thyromegaly Respiratory: Breath sounds on right lower lobe, rales on RLL. No wheezing or Crackles Cardiovascular: RRR, no murmurs / rubs / gallops. No extremity edema. 2+ pedal pulses. Abdomen: no tenderness, no masses palpated. No hepatosplenomegaly. Bowel sounds positive.  Musculoskeletal: no clubbing / cyanosis. No joint deformity upper and lower extremities. Good ROM Skin: no rashes, lesions Neurologic: CN 2-12 grossly intact.  Oriented to person only Psychiatric: Normal mood.    Labs on Admission: I have personally  reviewed following labs and imaging studies  CBC: Recent Labs  Lab 01/08/18 1029  WBC 6.7  HGB 8.9*  HCT 27.1*  MCV 90.3  PLT 194   Basic Metabolic Panel: Recent Labs  Lab 01/08/18 1029  NA 145  K 3.7  CL 117*  CO2 17*  GLUCOSE 111*  BUN 31*  CREATININE 1.92*  CALCIUM 9.3   GFR: Estimated Creatinine Clearance: 25.7 mL/min (A) (by C-G formula based on SCr of 1.92 mg/dL (H)). Liver Function Tests: Recent Labs  Lab 01/08/18 1029  AST 14*  ALT <5  ALKPHOS 65  BILITOT 0.5  PROT 7.3  ALBUMIN 3.7   No results for input(s): LIPASE, AMYLASE in the last 168 hours. No results for input(s): AMMONIA in the last 168 hours. Coagulation Profile: No results for input(s): INR, PROTIME in the last 168 hours. Cardiac Enzymes: Recent Labs  Lab 01/08/18 1029  TROPONINI <0.03   BNP (last 3 results) No results for input(s): PROBNP in the last 8760 hours. HbA1C: No results for input(s): HGBA1C in the last 72 hours. CBG: Recent Labs  Lab 01/08/18 1126  GLUCAP 103*    Lipid Profile: No results for input(s): CHOL, HDL, LDLCALC, TRIG, CHOLHDL, LDLDIRECT in the last 72 hours. Thyroid Function Tests: No results for input(s): TSH, T4TOTAL, FREET4, T3FREE, THYROIDAB in the last 72 hours. Anemia Panel: No results for input(s): VITAMINB12, FOLATE, FERRITIN, TIBC, IRON, RETICCTPCT in the last 72 hours. Urine analysis:    Component Value Date/Time   COLORURINE RED (A) 01/08/2018 1217   APPEARANCEUR TURBID (A) 01/08/2018 1217   LABSPEC 1.015 01/08/2018 1217   PHURINE 5.0 01/08/2018 1217   GLUCOSEU NEGATIVE 01/08/2018 1217   HGBUR LARGE (A) 01/08/2018 1217   BILIRUBINUR NEGATIVE 01/08/2018 1217   BILIRUBINUR n 12/02/2015 1535   KETONESUR NEGATIVE 01/08/2018 1217   PROTEINUR 100 (A) 01/08/2018 1217   UROBILINOGEN negative 12/02/2015 1535   UROBILINOGEN 0.2 02/17/2010 1259   NITRITE NEGATIVE 01/08/2018 1217   LEUKOCYTESUR NEGATIVE 01/08/2018 1217   Sepsis Labs: !!!!!!!!!!!!!!!!!!!!!!!!!!!!!!!!!!!!!!!!!!!! @LABRCNTIP (procalcitonin:4,lacticidven:4) )No results found for this or any previous visit (from the past 240 hour(s)).   Radiological Exams on Admission: Dg Chest 2 View  Result Date: 01/08/2018 CLINICAL DATA:  Worsening altered mental status and lethargy. History of Parkinson's disease EXAM: CHEST - 2 VIEW COMPARISON:  Chest x-ray of June 30, 2016 FINDINGS: The lungs are less well inflated. The interstitial markings are increased especially in the right mid and lower lung. The heart is normal in size. The pulmonary vascularity is not clearly engorged. The trachea is midline. The bony thorax exhibits no acute abnormality. IMPRESSION: Increased density predominantly in the right lower lobe worrisome for pneumonia. No overt CHF. Followup PA and lateral chest X-ray is recommended in 3-4 weeks following trial of antibiotic therapy to ensure resolution and exclude underlying malignancy. Electronically Signed   By: David  Martinique M.D.   On: 01/08/2018 11:11    Ct Head Wo Contrast  Result Date: 01/08/2018 CLINICAL DATA:  82 year old male with altered mental status. Parkinson's disease. Dementia. Hypertension. Initial encounter. EXAM: CT HEAD WITHOUT CONTRAST TECHNIQUE: Contiguous axial images were obtained from the base of the skull through the vertex without intravenous contrast. COMPARISON:  09/21/2017 head CT. FINDINGS: Brain: No intracranial hemorrhage or CT evidence of large acute infarct. Remote left caudate head infarct. Prominent chronic microvascular changes. Moderate global atrophy. No intracranial mass lesion noted on this unenhanced exam. Vascular: Vascular calcifications. Skull: No acute abnormality. Sinuses/Orbits: No acute orbital abnormality. Mild  mucosal thickening right maxillary sinus and minimal mucosal thickening left maxillary sinus. Other: Mastoid air cells and middle ear cavities are clear. IMPRESSION: No acute intracranial abnormality. Remote left caudate head infarct. Prominent chronic microvascular changes. Moderate global atrophy. Mild mucosal thickening right maxillary sinus and minimal mucosal thickening left maxillary sinus. Electronically Signed   By: Genia Del M.D.   On: 01/08/2018 11:21    EKG: Independently reviewed.  Normal sinus rhythm  Assessment/Plan Pneumonia RLL Concern for aspiration, as per family patient coughing and sometime choking with food.  Patient also have dementia and baseline neurological deficits.  Chest x-ray shows right lower lobe density.  Will admit to MedSurg, start on Unasyn, check procalcitonin and MRSA.  Will obtain blood and sputum culture, however patient was given antibiotics in the ED.  Supportive treatment.  Hypertension BP initially soft in the ED, will hold antihypertensive medication for now.  Monitor BP and resume medications as needed.  CKD stage III Creatinine upon admission 1.92, last creatinine on record 2.46 on 11/2017. Renal function seems to be stable, patient on IV fluids,  monitor BMP in a.m.  Hx DVT Resume Eliquis  History of bladder s/p radiation therapy UA noted with large hemoglobin and red color, patient had a recent cystoscopy with right ureteral stent changes.  No gross blood noted on condom cath.  Hemoglobin around baseline.  Monitor CBC and signs of overt bleeding.  If any signs of active bleeding consider urology consult  PSP (progressive supranuclear palsy)/Dementia  Stable, patient at risk for sundowning, monitor closely. Continue Sinemet, Aricept and Seroquel  DVT prophylaxis: Eliquis Code Status: Full code Family Communication: Daughter at bedside Disposition Plan: Anticipate discharge to previous home environment.  Consults called: None Admission status: Inpatient/MedSurg   Chipper Oman MD [facility] Pager: Text Page via [email or web address]  [phone]  If 7PM-7AM, please contact night-coverage [email or web address] Password [email or web address]  01/08/2018, 2:01 PM

## 2018-01-08 NOTE — ED Notes (Signed)
ED TO INPATIENT HANDOFF REPORT  Name/Age/Gender Brian Montgomery 82 y.o. male  Code Status    Code Status Orders  (From admission, onward)        Start     Ordered   01/08/18 1358  Full code  Continuous     01/08/18 1401    Code Status History    Date Active Date Inactive Code Status Order ID Comments User Context   11/23/2017 1810 11/24/2017 1310 Full Code [id number]  Raynelle Bring, MD Inpatient   06/26/2017 1734 06/27/2017 1525 Full Code [id number]  Raynelle Bring, MD Inpatient   03/16/2017 1516 03/17/2017 1324 Full Code [id number]  Raynelle Bring, MD Inpatient   06/30/2016 2016 07/03/2016 1440 Full Code [id number]  Elwin Mocha, MD Inpatient   06/13/2016 1934 06/17/2016 1930 Full Code [id number]  Eugenie Filler, MD Inpatient   01/21/2016 2024 01/22/2016 1624 Full Code [id number]  Raynelle Bring, MD Inpatient      Home/SNF/Other Home  Chief Complaint altered mental status  Level of Care/Admitting Diagnosis ED Disposition    ED Disposition Condition [place]: [facility] [998338]  Level of Care: Med-Surg [16]  Diagnosis: Pneumonia [250539]  Admitting Physician: Patrecia Pour, EDWIN [[id number]]  Attending Physician: Patrecia Pour, EDWIN [[id number]]  Estimated length of stay: past midnight tomorrow  Certification:: I certify this patient will need inpatient services for at least 2 midnights  PT Class (Do Not Modify): Inpatient [101]  PT Acc Code (Do Not Modify): Private [1]       Medical History Past Medical History:  Diagnosis Date  . Arthritis   . Bladder cancer ([place])    infiltrative high grade papillary urothelial carcinoma   . Dementia    mild   . Dyslipidemia   . ED (erectile dysfunction)   . Hypertension   . 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 Parkinson disease ([place])   . Pneumonia    x1 in adulthood   . Prostate cancer ([place])     Allergies No Known Allergies  IV Location/Drains/Wounds Patient  Lines/Drains/Airways Status   Active Line/Drains/Airways    Name:   Placement date:   Placement time:   Site:   Days:   Peripheral IV 01/08/18 Right Forearm   01/08/18    -    Forearm   less than 1   Ureteral Drain/Stent Right ureter 6 Fr.   06/26/17    1126    Right ureter   196   Ureteral Drain/Stent Right ureter 6 Fr.   11/23/17    1643    Right ureter   46   External Urinary Catheter   11/23/17    1810    -   46   Incision (Closed) 07/01/16 Hip Left   07/01/16    1625     556   Incision (Closed) 03/16/17 Penis   03/16/17    1120     298   Incision (Closed) 11/23/17 Perineum   11/23/17    1647     46          Labs/Imaging Results for orders placed or performed during the hospital encounter of 01/08/18 (from the past 48 hour(s))  Comprehensive metabolic panel     Status: Abnormal   Collection Time: 01/08/18 10:29 AM  Result Value Ref Range   Sodium 145 135 - 145 mmol/L   Potassium 3.7 3.5 - 5.1 mmol/L   Chloride 117 (H)  98 - 111 mmol/L    Comment: Please note change in reference range.   CO2 17 (L) 22 - 32 mmol/L   Glucose, Bld 111 (H) 70 - 99 mg/dL    Comment: Please note change in reference range.   BUN 31 (H) 8 - 23 mg/dL    Comment: Please note change in reference range.   Creatinine, Ser 1.92 (H) 0.61 - 1.24 mg/dL   Calcium 9.3 8.9 - 10.3 mg/dL   Total Protein 7.3 6.5 - 8.1 g/dL   Albumin 3.7 3.5 - 5.0 g/dL   AST 14 (L) 15 - 41 U/L   ALT <5 0 - 44 U/L   Alkaline Phosphatase 65 38 - 126 U/L   Total Bilirubin 0.5 0.3 - 1.2 mg/dL   GFR calc non Af Amer 31 (L) >60 mL/min   GFR calc Af Amer 36 (L) >60 mL/min    Comment: (NOTE) The eGFR has been calculated using the CKD EPI equation. This calculation has not been validated in all clinical situations. eGFR's persistently <60 mL/min signify possible Chronic Kidney Disease.    Anion gap 11 5 - 15    Comment: Performed at [facility], [place] [address] [place], [place] [address]  CBC     Status: Abnormal    Collection Time: 01/08/18 10:29 AM  Result Value Ref Range   WBC 6.7 4.0 - 10.5 K/uL   RBC 3.00 (L) 4.22 - 5.81 MIL/uL   Hemoglobin 8.9 (L) 13.0 - 17.0 g/dL   HCT 27.1 (L) 39.0 - 52.0 %   MCV 90.3 78.0 - 100.0 fL   MCH 29.7 26.0 - 34.0 pg   MCHC 32.8 30.0 - 36.0 g/dL   RDW 14.1 11.5 - 15.5 %   Platelets 235 150 - 400 K/uL    Comment: Performed at [facility], [place] [address] [place], [place] [address]  Troponin I     Status: None   Collection Time: 01/08/18 10:29 AM  Result Value Ref Range   Troponin I <0.03 <0.03 ng/mL    Comment: Performed at [facility], [place] [address] [place], [place] [address]  Brain natriuretic peptide     Status: None   Collection Time: 01/08/18 10:29 AM  Result Value Ref Range   B Natriuretic Peptide 18.9 0.0 - 100.0 pg/mL    Comment: Performed at [facility], [place] [address] [place],  [address]  CBG monitoring, ED     Status: Abnormal   Collection Time: 01/08/18 11:26 AM  Result Value Ref Range   Glucose-Capillary 103 (H) 70 - 99 mg/dL  Urinalysis, Complete w Microscopic     Status: Abnormal   Collection Time: 01/08/18 12:17 PM  Result Value Ref Range   Color, Urine RED (A) YELLOW    Comment: BIOCHEMICALS MAY BE AFFECTED BY COLOR   APPearance TURBID (A) CLEAR   Specific Gravity, Urine 1.015 1.005 - 1.030   pH 5.0 5.0 - 8.0   Glucose, UA NEGATIVE NEGATIVE mg/dL   Hgb urine dipstick LARGE (A) NEGATIVE   Bilirubin Urine NEGATIVE NEGATIVE   Ketones, ur NEGATIVE NEGATIVE mg/dL   Protein, ur 100 (A) NEGATIVE mg/dL   Nitrite NEGATIVE NEGATIVE   Leukocytes, UA NEGATIVE NEGATIVE   RBC / HPF >50 (H) 0 - 5 RBC/hpf   WBC, UA 6-10 0 - 5 WBC/hpf   Bacteria, UA RARE (A) NONE SEEN   Budding Yeast PRESENT     Comment: Performed at  [facility], [place] [address] [place],  [address]   Dg Chest 2 View  Result Date: 01/08/2018 CLINICAL DATA:  Worsening altered mental  status and lethargy. History of Parkinson's disease EXAM: CHEST - 2 VIEW COMPARISON:  Chest x-ray of June 30, 2016 FINDINGS: The lungs are less well inflated. The interstitial markings are increased especially in the right mid and lower lung. The heart is normal in size. The pulmonary vascularity is not clearly engorged. The trachea is midline. The bony thorax exhibits no acute abnormality. IMPRESSION: Increased density predominantly in the right lower lobe worrisome for pneumonia. No overt CHF. Followup PA and lateral chest X-ray is recommended in 3-4 weeks following trial of antibiotic therapy to ensure resolution and exclude underlying malignancy. Electronically Signed   By: David  Martinique M.D.   On: 01/08/2018 11:11   Ct Head Wo Contrast  Result Date: 01/08/2018 CLINICAL DATA:  82 year old male with altered mental status. Parkinson's disease. Dementia. Hypertension. Initial encounter. EXAM: CT HEAD WITHOUT CONTRAST TECHNIQUE: Contiguous axial images were obtained from the base of the skull through the vertex without intravenous contrast. COMPARISON:  09/21/2017 head CT. FINDINGS: Brain: No intracranial hemorrhage or CT evidence of large acute infarct. Remote left caudate head infarct. Prominent chronic microvascular changes. Moderate global atrophy. No intracranial mass lesion noted on this unenhanced exam. Vascular: Vascular calcifications. Skull: No acute abnormality. Sinuses/Orbits: No acute orbital abnormality. Mild mucosal thickening right maxillary sinus and minimal mucosal thickening left maxillary sinus. Other: Mastoid air cells and middle ear cavities are clear. IMPRESSION: No acute intracranial abnormality. Remote left caudate head infarct. Prominent chronic microvascular changes. Moderate global atrophy. Mild mucosal thickening right maxillary sinus and minimal mucosal thickening left maxillary sinus. Electronically Signed   By: Genia Del M.D.   On: 01/08/2018 11:21    Pending  Labs Unresulted Labs (From admission, onward)   Start     Ordered   01/09/18 8119  Basic metabolic panel  Tomorrow morning,   R     01/08/18 1401   01/09/18 0500  CBC  Tomorrow morning,   R     01/08/18 1401   01/08/18 1404  Culture, blood (Routine X 2) w Reflex to ID Panel  BLOOD CULTURE X 2,   R    Question:  Patient immune status  Answer:  Normal   01/08/18 1404   01/08/18 1404  Culture, expectorated sputum-assessment  Once,   R    Question:  Patient immune status  Answer:  Normal   01/08/18 1404   01/08/18 1358  Procalcitonin - Baseline  STAT,   STAT     01/08/18 1401   01/08/18 1358  MRSA PCR Screening  Once,   R     01/08/18 1401      Vitals/Pain Today's Vitals   01/08/18 1230 01/08/18 1300 01/08/18 1330 01/08/18 1400  BP: 132/80 (!) 155/88 131/77 116/71  Pulse: 91 (!) 127 (!) 102 92  Resp: (!) 27 (!) 40 (!) 32 (!) 26  Temp:      SpO2: 100% 100% 100% 98%  Weight:      PainSc:        Isolation Precautions No active isolations  Medications Medications  apixaban (ELIQUIS) tablet 5 mg (has no administration in time range)  carbidopa-levodopa (SINEMET IR) 25-100 MG per tablet immediate release 1.5 tablet (has no administration in time range)  QUEtiapine (SEROQUEL) tablet 50 mg (has no administration in time range)  simvastatin (ZOCOR) tablet 20 mg (has no  administration in time range)  0.9 %  sodium chloride infusion (has no administration in time range)  acetaminophen (TYLENOL) tablet 650 mg (has no administration in time range)    Or  acetaminophen (TYLENOL) suppository 650 mg (has no administration in time range)  ondansetron (ZOFRAN) tablet 4 mg (has no administration in time range)    Or  ondansetron (ZOFRAN) injection 4 mg (has no administration in time range)  senna-docusate (Senokot-S) tablet 1 tablet (has no administration in time range)  guaiFENesin-dextromethorphan (ROBITUSSIN DM) 100-10 MG/5ML syrup 5 mL (has no administration in time range)  sodium  chloride 0.9 % bolus 500 mL (0 mLs Intravenous Stopped 01/08/18 1255)  cefTRIAXone (ROCEPHIN) 1 g in sodium chloride 0.9 % 100 mL IVPB (0 g Intravenous Stopped 01/08/18 1251)  azithromycin (ZITHROMAX) 500 mg in sodium chloride 0.9 % 250 mL IVPB (0 mg Intravenous Stopped 01/08/18 1416)    Mobility manual wheelchair

## 2018-01-08 NOTE — ED Provider Notes (Signed)
[facility] DEPT Provider Note   CSN: [id number] Arrival date & time: 01/08/18  1008     History   Chief Complaint Chief Complaint  Patient presents with  . Altered Mental Status    HPI Reynaldo Rossman is a 82 y.o. male.  HPI Patient states he presents to the emergency room because he was feeling short of breath this morning.  Patient states he has been coughing but denies any chest pain.  Patient was brought in by EMS and according to the EMS report the family had called the paramedics because the patient was confused and more lethargic this morning.  Patient denies any trouble with headache.  He denies any chest pain.  He denies any abdominal pain.  He denies any nausea, vomiting, diarrhea or fever. Past Medical History:  Diagnosis Date  . Arthritis   . Bladder cancer ([place])    infiltrative high grade papillary urothelial carcinoma   . Dementia    mild   . Dyslipidemia   . ED (erectile dysfunction)   . Hypertension   . 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 Parkinson disease ([place])   . Pneumonia    x1 in adulthood   . Prostate cancer [facility]     Patient Active Problem List   Diagnosis Date Noted  . Ureteral obstruction, right 06/26/2017  . PSP (progressive supranuclear palsy) ([place]) 08/25/2016  . Dementia 07/03/2016  . CKD (chronic kidney disease) stage 3, GFR 30-59 ml/min (HCC) 07/01/2016  . Hip fracture ([place]) 06/30/2016  . Weakness generalized 06/13/2016  . Malignant neoplasm of prostate ([place]) 03/18/2016  . Bladder cancer ([place]) 01/21/2016  . Appendicular ataxia 09/24/2015  . Pruritus 09/24/2015  . Hereditary and idiopathic peripheral neuropathy 01/14/2015  . Gait instability 10/13/2014  . Hyperlipidemia with target LDL less than 100 08/06/2013  . Essential hypertension, benign 09/17/2012    Past Surgical History:  Procedure Laterality Date  . APPENDECTOMY    . BACK SURGERY     lumbar ' years ago   . COLONOSCOPY   2007   Dr.kaplan  . CYSTOSCOPY N/A 03/22/2016   Procedure: CYSTOSCOPY;  Surgeon: Raynelle Bring, MD;  Location: [facility];  Service: Urology;  Laterality: N/A;  . CYSTOSCOPY W/ RETROGRADES N/A 01/21/2016   Procedure: CYSTOSCOPY WITH TURBT WITH  CLOT EVACUATION;  Surgeon: Raynelle Bring, MD;  Location: [facility];  Service: Urology;  Laterality: N/A;  . CYSTOSCOPY W/ RETROGRADES N/A 10/20/2016   Procedure: CYSTOSCOPY WITH BILATERAL RETROGRADE PYELOGRAM;  Surgeon: Raynelle Bring, MD;  Location: [facility];  Service: Urology;  Laterality: N/A;  . CYSTOSCOPY W/ URETERAL STENT PLACEMENT Right 06/26/2017   Procedure: CYSTOSCOPY WITH STENT CHANGE;  Surgeon: Raynelle Bring, MD;  Location: [facility];  Service: Urology;  Laterality: Right;  . cystoscopy with stent exchange     06-26-17 Dr Alinda Money  . CYSTOSCOPY WITH STENT PLACEMENT Right 03/16/2017   Procedure: CYSTOSCOPY WITH STENT PLACEMENT;  Surgeon: Raynelle Bring, MD;  [facility];  Service: Urology;  Laterality: Right;  . CYSTOSCOPY WITH STENT PLACEMENT Right 11/23/2017   Procedure: CYSTOSCOPY REMOVAL AND  STENT PLACEMENT;  Surgeon: Raynelle Bring, MD;  Location[facility];  Service: Urology;  Laterality: Right;  . INTRAMEDULLARY (IM) NAIL INTERTROCHANTERIC Left 07/01/2016   Procedure: INTRAMEDULLARY (IM) NAIL INTERTROCHANTERIC;  Surgeon: Tania Ade, MD;  Location: [facility];  Service: Orthopedics;  Laterality: Left;  . left total knee Left 2011  . PROSTATE BIOPSY N/A 03/22/2016   Procedure:  BIOPSY TRANSRECTAL ULTRASONIC PROSTATE (TUBP);  Surgeon: Raynelle Bring, MD;  [facility];  Service: Urology;  Laterality: N/A;  . TRANSURETHRAL RESECTION OF BLADDER TUMOR N/A 03/22/2016   Procedure: TRANSURETHRAL RESECTION OF BLADDER TUMOR (TURBT);  Surgeon: Raynelle Bring, MD;  [facility];  Service: Urology;  Laterality: N/A;  . TRANSURETHRAL RESECTION OF BLADDER TUMOR N/A 10/20/2016   Procedure: TRANSURETHRAL RESECTION OF BLADDER TUMOR (TURBT);  Surgeon: Raynelle Bring,  MD;  [facility];  Service: Urology;  Laterality: N/A;  . TRANSURETHRAL RESECTION OF BLADDER TUMOR N/A 03/16/2017   Procedure: TRANSURETHRAL RESECTION OF BLADDER TUMOR (TURBT);  Surgeon: Raynelle Bring, MD;  [facility];  Service: Urology;  Laterality: N/A;        Home Medications    Prior to Admission medications   Medication Sig Start Date End Date Taking? Authorizing Provider  acetaminophen (TYLENOL) 500 MG tablet Take 500 mg by mouth every 6 (six) hours as needed for moderate pain or headache.   Yes [provider]  apixaban (ELIQUIS) 5 MG TABS tablet Take 1 tablet (5 mg total) by mouth 2 (two) times daily. 11/15/17  Yes Denita Lung, MD  Artificial Tear Ointment (DRY EYES OP) Place 1 drop into both eyes as needed (for dry eyes).    Yes [provider]  carbidopa-levodopa (SINEMET IR) 25-100 MG tablet Take 1.5 tablets by mouth 3 (three) times daily. 03/14/17  Yes Jaffe, Adam R, DO  citalopram (CELEXA) 20 MG tablet TAKE 1 TABLET(20 MG) BY MOUTH DAILY 10/24/17  Yes Denita Lung, MD  lisinopril-hydrochlorothiazide (PRINZIDE,ZESTORETIC) 10-12.5 MG tablet Take 1 tablet by mouth daily. 07/12/17  Yes Denita Lung, MD  Melatonin 3 MG CAPS Take 3 mg by mouth every other day as needed (for sleep.).    Yes [provider]  pseudoephedrine-guaifenesin (MUCINEX D) 60-600 MG 12 hr tablet Take 1 tablet by mouth at bedtime as needed for congestion.    Yes [provider]  QUEtiapine (SEROQUEL) 50 MG tablet TAKE 1 TABLET BY MOUTH AT BEDTIME 12/27/17  Yes Jaffe, Adam R, DO  simvastatin (ZOCOR) 20 MG tablet TAKE 1 TABLET BY MOUTH DAILY 11/29/17  Yes Denita Lung, MD  donepezil (ARICEPT) 10 MG tablet TAKE 1 TABLET(10 MG) BY MOUTH AT BEDTIME Patient not taking: Reported on 11/10/2017 08/28/17   Pieter Partridge, DO    Family History Family History  Problem Relation Age of Onset  . Heart failure Mother   . Hypertension Mother   . Heart failure Father   .  Hypertension Father   . Renal Disease Sister   . Diabetes Brother     Social History Social History   Tobacco Use  . Smoking status: Former Smoker    Packs/day: 0.25    Years: 2.00    Pack years: 0.50    Types: Cigarettes    Last attempt to quit: 07/12/1967    Years since quitting: 50.5  . Smokeless tobacco: Never Used  Substance Use Topics  . Alcohol use: No    Alcohol/week: 0.0 oz  . Drug use: No     Allergies   Patient has no known allergies.   Review of Systems Review of Systems  All other systems reviewed and are negative.    Physical Exam Updated Vital Signs BP 132/80   Pulse 91   Temp 97.8 F (36.6 C)   Resp (!) 27   Wt 69.9 kg (154 lb)   SpO2 100%   BMI 26.43  kg/m   Physical Exam  HENT:  Head: Normocephalic and atraumatic. Head is without raccoon's eyes and without Battle's sign.  Right Ear: External ear normal.  Left Ear: External ear normal.  Eyes: Lids are normal. Right eye exhibits no discharge. Right conjunctiva has no hemorrhage. Left conjunctiva has no hemorrhage.  Neck: No spinous process tenderness present. No tracheal deviation and no edema present.  Cardiovascular: Normal rate, regular rhythm and normal heart sounds.  Pulmonary/Chest: Effort normal and breath sounds normal. No stridor. No respiratory distress. He exhibits no tenderness, no crepitus and no deformity.  Abdominal: Soft. Normal appearance and bowel sounds are normal. He exhibits no distension and no mass. There is no tenderness.  Negative for seat belt sign  Musculoskeletal:       Cervical back: He exhibits no tenderness, no swelling and no deformity.       Thoracic back: He exhibits no tenderness, no swelling and no deformity.       Lumbar back: He exhibits no tenderness and no swelling.  Pelvis stable, no ttp  Neurological: He is alert. No sensory deficit. He exhibits normal muscle tone. GCS eye subscore is 4. GCS verbal subscore is 4. GCS motor subscore is 6.  Patient has  generalized weakness.  He does move all 4 extremities but less so on the left side.  Patient answers questions appropriately  Skin: He is not diaphoretic.  Psychiatric: He has a normal mood and affect. His speech is normal and behavior is normal.  Nursing note and vitals reviewed.    ED Treatments / Results  Labs (all labs ordered are listed, but only abnormal results are displayed) Labs Reviewed  COMPREHENSIVE METABOLIC PANEL - Abnormal; Notable for the following components:      Result Value   Chloride 117 (*)    CO2 17 (*)    Glucose, Bld 111 (*)    BUN 31 (*)    Creatinine, Ser 1.92 (*)    AST 14 (*)    GFR calc non Af Amer 31 (*)    GFR calc Af Amer 36 (*)    All other components within normal limits  CBC - Abnormal; Notable for the following components:   RBC 3.00 (*)    Hemoglobin 8.9 (*)    HCT 27.1 (*)    All other components within normal limits  URINALYSIS, COMPLETE (UACMP) WITH MICROSCOPIC - Abnormal; Notable for the following components:   Color, Urine RED (*)    APPearance TURBID (*)    Hgb urine dipstick LARGE (*)    Protein, ur 100 (*)    RBC / HPF >50 (*)    Bacteria, UA RARE (*)    All other components within normal limits  CBG MONITORING, ED - Abnormal; Notable for the following components:   Glucose-Capillary 103 (*)    All other components within normal limits  TROPONIN I  BRAIN NATRIURETIC PEPTIDE    EKG None  Radiology Dg Chest 2 View  Result Date: 01/08/2018 CLINICAL DATA:  Worsening altered mental status and lethargy. History of Parkinson's disease EXAM: CHEST - 2 VIEW COMPARISON:  Chest x-ray of June 30, 2016 FINDINGS: The lungs are less well inflated. The interstitial markings are increased especially in the right mid and lower lung. The heart is normal in size. The pulmonary vascularity is not clearly engorged. The trachea is midline. The bony thorax exhibits no acute abnormality. IMPRESSION: Increased density predominantly in the right  lower lobe worrisome for pneumonia. No overt  CHF. Followup PA and lateral chest X-ray is recommended in 3-4 weeks following trial of antibiotic therapy to ensure resolution and exclude underlying malignancy. Electronically Signed   By: David  Martinique M.D.   On: 01/08/2018 11:11   Ct Head Wo Contrast  Result Date: 01/08/2018 CLINICAL DATA:  82 year old male with altered mental status. Parkinson's disease. Dementia. Hypertension. Initial encounter. EXAM: CT HEAD WITHOUT CONTRAST TECHNIQUE: Contiguous axial images were obtained from the base of the skull through the vertex without intravenous contrast. COMPARISON:  09/21/2017 head CT. FINDINGS: Brain: No intracranial hemorrhage or CT evidence of large acute infarct. Remote left caudate head infarct. Prominent chronic microvascular changes. Moderate global atrophy. No intracranial mass lesion noted on this unenhanced exam. Vascular: Vascular calcifications. Skull: No acute abnormality. Sinuses/Orbits: No acute orbital abnormality. Mild mucosal thickening right maxillary sinus and minimal mucosal thickening left maxillary sinus. Other: Mastoid air cells and middle ear cavities are clear. IMPRESSION: No acute intracranial abnormality. Remote left caudate head infarct. Prominent chronic microvascular changes. Moderate global atrophy. Mild mucosal thickening right maxillary sinus and minimal mucosal thickening left maxillary sinus. Electronically Signed   By: Genia Del M.D.   On: 01/08/2018 11:21    Procedures Procedures (including critical care time)  Medications Ordered in ED Medications  azithromycin (ZITHROMAX) 500 mg in sodium chloride 0.9 % 250 mL IVPB (500 mg Intravenous New Bag/Given 01/08/18 1255)  sodium chloride 0.9 % bolus 500 mL (0 mLs Intravenous Stopped 01/08/18 1255)  cefTRIAXone (ROCEPHIN) 1 g in sodium chloride 0.9 % 100 mL IVPB (0 g Intravenous Stopped 01/08/18 1251)     Initial Impression / Assessment and Plan / ED Course  I have reviewed  the triage vital signs and the nursing notes.  Pertinent labs & imaging results that were available during my care of the patient were reviewed by me and considered in my medical decision making (see chart for details).  Clinical Course as of Jan 09 1307  Mon Jan 08, 2018  1115 Anemia noted.  Trending downward.     [JK]  1115 Cr stable.  Bicarb low   [JK]  1306 CT scan without acute findings.  Chest x-ray suggest pneumonia.   [JK]  1308 Platelets: 235 [JK]    Clinical Course User Index [JK] Dorie Rank, MD    Patient presented to the emergency room for evaluation of weakness and shortness of breath.  Chest x-ray suggests pneumonia.  Patient's vital signs are stable.  I doubt sepsis.  Plan on admission to the hospital for IV antibiotics and further treatment. Final Clinical Impressions(s) / ED Diagnoses   Final diagnoses:  Pneumonia due to infectious organism, unspecified laterality, unspecified part of lung      Dorie Rank, MD 01/08/18 1309

## 2018-01-08 NOTE — Progress Notes (Signed)
Pharmacy Antibiotic Note  Brian Montgomery is a 82 y.o. male admitted on 01/08/2018 with pneumonia- concern for aspiration.  Pharmacy has been consulted for Unasyn dosing. 01/08/2018:  Afebrile  No leukocytosis  PCT, cx data pending  Scr elevated, est CrCl ~27ml/min  Plan: Unasyn 3gm IV q12h Monitor renal function and cx data   Weight: 154 lb (69.9 kg)  Temp (24hrs), Avg:97.8 F (36.6 C), Min:97.8 F (36.6 C), Max:97.8 F (36.6 C)  Recent Labs  Lab 01/08/18 1029  WBC 6.7  CREATININE 1.92*    Estimated Creatinine Clearance: 25.7 mL/min (A) (by C-G formula based on SCr of 1.92 mg/dL (H)).    No Known Allergies  Antimicrobials this admission: 7/1 Rocephin/Zithromax x1  7/1 Unasyn >>   Dose adjustments this admission:  Microbiology results: (not collected prior to initial abx dose) 7/1 BCx:  7/1 Sputum:   7/1 MRSA PCR:   Thank you for allowing pharmacy to be a part of this patient's care.  Biagio Borg 01/08/2018 2:24 PM

## 2018-01-08 NOTE — ED Triage Notes (Signed)
Patient BIB EMS from home with c/o of increased AMS and increased lethargy, per family. Patient has history of Parkinsons disease,palsy to his left side, dementia, hypertension. Patient is wheelchair bound. Patient does not move his left side at all due to his history of palsy. Patient denies pain, nausea/vomiting/diarrhea/fever.  EMS VS- 130/72, 90HR, 98% RA, 16RR, CBG=99

## 2018-01-09 DIAGNOSIS — G20 Parkinson's disease: Secondary | ICD-10-CM

## 2018-01-09 DIAGNOSIS — F0281 Dementia in other diseases classified elsewhere with behavioral disturbance: Secondary | ICD-10-CM

## 2018-01-09 LAB — BASIC METABOLIC PANEL
ANION GAP: 6 (ref 5–15)
BUN: 29 mg/dL — AB (ref 8–23)
CO2: 24 mmol/L (ref 22–32)
Calcium: 8.9 mg/dL (ref 8.9–10.3)
Chloride: 116 mmol/L — ABNORMAL HIGH (ref 98–111)
Creatinine, Ser: 1.7 mg/dL — ABNORMAL HIGH (ref 0.61–1.24)
GFR calc non Af Amer: 36 mL/min — ABNORMAL LOW (ref >60)
GFR, EST AFRICAN AMERICAN: 42 mL/min — AB (ref >60)
Glucose, Bld: 86 mg/dL (ref 70–99)
POTASSIUM: 3.8 mmol/L (ref 3.5–5.1)
Sodium: 146 mmol/L — ABNORMAL HIGH (ref 135–145)

## 2018-01-09 LAB — CBC
HEMATOCRIT: 24.1 % — AB (ref 39.0–52.0)
HEMOGLOBIN: 7.6 g/dL — AB (ref 13.0–17.0)
MCH: 28.9 pg (ref 26.0–34.0)
MCHC: 31.5 g/dL (ref 30.0–36.0)
MCV: 91.6 fL (ref 78.0–100.0)
Platelets: 208 10*3/uL (ref 150–400)
RBC: 2.63 MIL/uL — ABNORMAL LOW (ref 4.22–5.81)
RDW: 14.2 % (ref 11.5–15.5)
WBC: 5.4 10*3/uL (ref 4.0–10.5)

## 2018-01-09 LAB — MRSA PCR SCREENING: MRSA BY PCR: NEGATIVE

## 2018-01-09 MED ORDER — SODIUM CHLORIDE 0.45 % IV SOLN
INTRAVENOUS | Status: DC
  Administered 2018-01-09: 16:00:00 via INTRAVENOUS

## 2018-01-09 MED ORDER — SODIUM CHLORIDE 0.45 % IV SOLN: INTRAVENOUS | Status: DC

## 2018-01-09 NOTE — Progress Notes (Signed)
PROGRESS NOTE    Brian Montgomery  FIE:[id number] DOB: 10/06/1935 DOA: 01/08/2018 PCP: Denita Lung, MD   Brief Narrative:  Brian Montgomery is a 82 y.o. male with medical history significant of hypertension, CKD stage III, bladder and kidney cancer, DVT on Xarelto, dementia (completely dependent for daily activities) and PSP. Admitted for confusion and found to have aspiration pneumonia, currently on unasyn.   Assessment & Plan   Right lower lobe pneumonia, concern for aspiration -Per family patient has been coughing and choking on food -At baseline, patient has dementia with some neurological deficits -Chest x-ray reviewed, showing right lower lobe density -Continue Unasyn -Speech therapy consulted, plan for MBS on 7/3  Chronic normocytic Anemia -Hemoglobin currently 7.6, baseline approximately 9 -Anemia panel and FOBT ordered -Drop possibly diluational as patient is currently on IVF -Currently on Eliquis for DVT, if FOBT +, may need GI consult  Hypernatremia -will transition to 1/2 normal saline and monitor BMP  Essential hypertension -Currently stable -Lisinopril/HCTZ held  CKD, stage III -Creatinine currently at baseline, continue to monitor BMP  History of DVT -currently on Eliquis  History of bladder cancer status post radiation therapy -UA noted to have large hemoglobin and red in color -Status post recent cystoscopy with right ureteral stent changes -Hemoglobin currently 7.6, baseline 9  Dementia/PSP -Continue Sinemet, seroquel -no longer taking aricept  DVT Prophylaxis  Eliquis  Code Status: Full  Family Communication: None at bedside  Disposition Plan: Admitted. Pending improvement  Consultants None  Procedures  None  Antibiotics   Anti-infectives (From admission, onward)   Start     Dose/Rate Route Frequency Ordered Stop   01/08/18 2200  Ampicillin-Sulbactam (UNASYN) 3 g in sodium chloride 0.9 % 100 mL IVPB     3 g 200 mL/hr over 30 Minutes  Intravenous Every 12 hours 01/08/18 1431     01/08/18 1300  azithromycin (ZITHROMAX) 500 mg in sodium chloride 0.9 % 250 mL IVPB     500 mg 250 mL/hr over 60 Minutes Intravenous  Once 01/08/18 1250 01/08/18 1416   01/08/18 1130  cefTRIAXone (ROCEPHIN) 1 g in sodium chloride 0.9 % 100 mL IVPB     1 g 200 mL/hr over 30 Minutes Intravenous  Once 01/08/18 1117 01/08/18 1251   01/08/18 1130  azithromycin (ZITHROMAX) tablet 500 mg  Status:  Discontinued     500 mg Oral  Once 01/08/18 1117 01/08/18 1250      Subjective:   [name] seen and examined today.  Complaints today.  Not very interactive.  Objective:   Vitals:   01/08/18 1526 01/08/18 2028 01/09/18 0501 01/09/18 1400  BP: 125/75 127/76 102/65 121/68  Pulse: 84 87 74 67  Resp: (!) 22 20 18 20   Temp: 98.6 F (37 C) 98.2 F (36.8 C) (!) 97.5 F (36.4 C) 97.9 F (36.6 C)  TempSrc: Oral Oral Oral Oral  SpO2: 100% 100% 100% 100%  Weight:        Intake/Output Summary (Last 24 hours) at 01/09/2018 1448 Last data filed at 01/09/2018 1660 Gross per 24 hour  Intake 980 ml  Output 400 ml  Net 580 ml   Filed Weights   01/08/18 1018  Weight: 69.9 kg (154 lb)    Exam  General: Well developed, chronically ill appearing, NAD  HEENT: NCAT, mucous membranes moist.   Neck: Supple  Cardiovascular: S1 S2 auscultated, RRR, no murmurs  Respiratory: Diminished but clear, no wheezing   Abdomen: Soft, nontender, nondistended, + bowel sounds  Extremities: warm dry without cyanosis clubbing or edema  Neuro: AAOx1, nonfocal  Psych: Flat    Data Reviewed: I have personally reviewed following labs and imaging studies  CBC: Recent Labs  Lab 01/08/18 1029 01/09/18 0556  WBC 6.7 5.4  HGB 8.9* 7.6*  HCT 27.1* 24.1*  MCV 90.3 91.6  PLT 235 585   Basic Metabolic Panel: Recent Labs  Lab 01/08/18 1029 01/09/18 0556  NA 145 146*  K 3.7 3.8  CL 117* 116*  CO2 17* 24  GLUCOSE 111* 86  BUN 31* 29*  CREATININE 1.92* 1.70*   CALCIUM 9.3 8.9   GFR: Estimated Creatinine Clearance: 29 mL/min (A) (by C-G formula based on SCr of 1.7 mg/dL (H)). Liver Function Tests: Recent Labs  Lab 01/08/18 1029  AST 14*  ALT <5  ALKPHOS 65  BILITOT 0.5  PROT 7.3  ALBUMIN 3.7   No results for input(s): LIPASE, AMYLASE in the last 168 hours. No results for input(s): AMMONIA in the last 168 hours. Coagulation Profile: No results for input(s): INR, PROTIME in the last 168 hours. Cardiac Enzymes: Recent Labs  Lab 01/08/18 1029  TROPONINI <0.03   BNP (last 3 results) No results for input(s): PROBNP in the last 8760 hours. HbA1C: No results for input(s): HGBA1C in the last 72 hours. CBG: Recent Labs  Lab 01/08/18 1126  GLUCAP 103*   Lipid Profile: No results for input(s): CHOL, HDL, LDLCALC, TRIG, CHOLHDL, LDLDIRECT in the last 72 hours. Thyroid Function Tests: No results for input(s): TSH, T4TOTAL, FREET4, T3FREE, THYROIDAB in the last 72 hours. Anemia Panel: No results for input(s): VITAMINB12, FOLATE, FERRITIN, TIBC, IRON, RETICCTPCT in the last 72 hours. Urine analysis:    Component Value Date/Time   COLORURINE RED (A) 01/08/2018 1217   APPEARANCEUR TURBID (A) 01/08/2018 1217   LABSPEC 1.015 01/08/2018 1217   PHURINE 5.0 01/08/2018 1217   GLUCOSEU NEGATIVE 01/08/2018 1217   HGBUR LARGE (A) 01/08/2018 1217   BILIRUBINUR NEGATIVE 01/08/2018 1217   BILIRUBINUR n 12/02/2015 1535   KETONESUR NEGATIVE 01/08/2018 1217   PROTEINUR 100 (A) 01/08/2018 1217   UROBILINOGEN negative 12/02/2015 1535   UROBILINOGEN 0.2 02/17/2010 1259   NITRITE NEGATIVE 01/08/2018 1217   LEUKOCYTESUR NEGATIVE 01/08/2018 1217   Sepsis Labs: @LABRCNTIP (procalcitonin:4,lacticidven:4)  ) Recent Results (from the past 240 hour(s))  Culture, blood (Routine X 2) w Reflex to ID Panel     Status: None (Preliminary result)   Collection Time: 01/08/18  3:31 PM  Result Value Ref Range Status   Specimen Description   Final    BLOOD  RIGHT HAND THUMB Performed at [facility], [place] [address], [place], [place] [address]    Special Requests   Final    BOTTLES DRAWN AEROBIC AND ANAEROBIC Blood Culture adequate volume Performed at [facility] [address] [place], [place] [address]    Culture   Final    NO GROWTH < 24 HOURS Performed at [facility] [address] [place], [place] [address]    Report Status PENDING  Incomplete  Culture, blood (Routine X 2) w Reflex to ID Panel     Status: None (Preliminary result)   Collection Time: 01/08/18  3:37 PM  Result Value Ref Range Status   Specimen Description   Final    BLOOD RIGHT HAND Performed at [facility] [address] [place], [place] [address]    Special Requests   Final    BOTTLES DRAWN  AEROBIC AND ANAEROBIC Blood Culture adequate volume Performed at [facility] [address] [place], [place] [address]    Culture   Final    NO GROWTH < 24 HOURS Performed at [facility] [address], [place], [place] [address]    Report Status PENDING  Incomplete  MRSA PCR Screening     Status: None   Collection Time: 01/09/18  6:25 AM  Result Value Ref Range Status   MRSA by PCR NEGATIVE NEGATIVE Final    Comment:        The GeneXpert MRSA Assay (FDA approved for NASAL specimens only), is one component of a comprehensive MRSA colonization surveillance program. It is not intended to diagnose MRSA infection nor to guide or monitor treatment for MRSA infections. Performed at [facility], [place] [address], [place], [place] [address]       Radiology Studies: Dg Chest 2 View  Result Date: 01/08/2018 CLINICAL DATA:  Worsening altered mental status and lethargy. History of Parkinson's disease EXAM: CHEST - 2 VIEW COMPARISON:  Chest x-ray of June 30, 2016 FINDINGS: The lungs are less well inflated. The interstitial markings are increased  especially in the right mid and lower lung. The heart is normal in size. The pulmonary vascularity is not clearly engorged. The trachea is midline. The bony thorax exhibits no acute abnormality. IMPRESSION: Increased density predominantly in the right lower lobe worrisome for pneumonia. No overt CHF. Followup PA and lateral chest X-ray is recommended in 3-4 weeks following trial of antibiotic therapy to ensure resolution and exclude underlying malignancy. Electronically Signed   By: David  Martinique M.D.   On: 01/08/2018 11:11   Ct Head Wo Contrast  Result Date: 01/08/2018 CLINICAL DATA:  82 year old male with altered mental status. Parkinson's disease. Dementia. Hypertension. Initial encounter. EXAM: CT HEAD WITHOUT CONTRAST TECHNIQUE: Contiguous axial images were obtained from the base of the skull through the vertex without intravenous contrast. COMPARISON:  09/21/2017 head CT. FINDINGS: Brain: No intracranial hemorrhage or CT evidence of large acute infarct. Remote left caudate head infarct. Prominent chronic microvascular changes. Moderate global atrophy. No intracranial mass lesion noted on this unenhanced exam. Vascular: Vascular calcifications. Skull: No acute abnormality. Sinuses/Orbits: No acute orbital abnormality. Mild mucosal thickening right maxillary sinus and minimal mucosal thickening left maxillary sinus. Other: Mastoid air cells and middle ear cavities are clear. IMPRESSION: No acute intracranial abnormality. Remote left caudate head infarct. Prominent chronic microvascular changes. Moderate global atrophy. Mild mucosal thickening right maxillary sinus and minimal mucosal thickening left maxillary sinus. Electronically Signed   By: Genia Del M.D.   On: 01/08/2018 11:21     Scheduled Meds: . apixaban  5 mg Oral BID  . carbidopa-levodopa  1.5 tablet Oral TID  . QUEtiapine  50 mg Oral QHS  . simvastatin  20 mg Oral q1800   Continuous Infusions: . sodium chloride 75 mL/hr at 01/09/18  0414  . ampicillin-sulbactam (UNASYN) IV Stopped (01/09/18 1107)     LOS: 1 day   Time Spent in minutes   45 minutes  Shreya Lacasse D.O. on 01/09/2018 at 2:49 PM  Between 7am to 7pm - Pager - [phone]  After 7pm go to [email or web address] - password [email or web address]  And look for the night coverage person covering for me after hours  [facility] Office  [phone]

## 2018-01-09 NOTE — Evaluation (Addendum)
Clinical/Bedside Swallow Evaluation Patient Details  Name: Brian Montgomery MRN: [id number] Date of Birth: 1935-10-19  Today's Date: 01/09/2018 Time: SLP Start Time (ACUTE ONLY): 30 SLP Stop Time (ACUTE ONLY): 1453 SLP Time Calculation (min) (ACUTE ONLY): 45 min  Past Medical History:  Past Medical History:  Diagnosis Date  . Arthritis   . Bladder cancer ([place])    infiltrative high grade papillary urothelial carcinoma   . Dementia    mild   . Dyslipidemia   . ED (erectile dysfunction)   . Hypertension   . 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 Parkinson disease ([place])   . Pneumonia    x1 in adulthood   . Prostate cancer [facility])    Past Surgical History:  Past Surgical History:  Procedure Laterality Date  . APPENDECTOMY    . BACK SURGERY     lumbar ' years ago   . COLONOSCOPY  2007   Dr.[email or web address]  . CYSTOSCOPY N/A 03/22/2016   Procedure: CYSTOSCOPY;  Surgeon: Raynelle Bring, MD;  Location: [facility];  Service: Urology;  Laterality: N/A;  . CYSTOSCOPY W/ RETROGRADES N/A 01/21/2016   Procedure: CYSTOSCOPY WITH TURBT WITH  CLOT EVACUATION;  Surgeon: Raynelle Bring, MD;  [facility];  Service: Urology;  Laterality: N/A;  . CYSTOSCOPY W/ RETROGRADES N/A 10/20/2016   Procedure: CYSTOSCOPY WITH BILATERAL RETROGRADE PYELOGRAM;  Surgeon: Raynelle Bring, MD;  [facility];  Service: Urology;  Laterality: N/A;  . CYSTOSCOPY W/ URETERAL STENT PLACEMENT Right 06/26/2017   Procedure: CYSTOSCOPY WITH STENT CHANGE;  Surgeon: Raynelle Bring, MD;  [facility];  Service: Urology;  Laterality: Right;  . cystoscopy with stent exchange     06-26-17 Dr Alinda Money  . CYSTOSCOPY WITH STENT PLACEMENT Right 03/16/2017   Procedure: CYSTOSCOPY WITH STENT PLACEMENT;  Surgeon: Raynelle Bring, MD;  [facility];  Service: Urology;  Laterality: Right;  . CYSTOSCOPY WITH STENT PLACEMENT Right 11/23/2017   Procedure: CYSTOSCOPY REMOVAL AND  STENT PLACEMENT;  Surgeon: Raynelle Bring, MD;  [facility]:  [facility];  Service: Urology;  Laterality: Right;  . INTRAMEDULLARY (IM) NAIL INTERTROCHANTERIC Left 07/01/2016   Procedure: INTRAMEDULLARY (IM) NAIL INTERTROCHANTERIC;  Surgeon: Tania Ade, MD;  Location: [facility];  Service: Orthopedics;  Laterality: Left;  . left total knee Left 2011  . PROSTATE BIOPSY N/A 03/22/2016   Procedure: BIOPSY TRANSRECTAL ULTRASONIC PROSTATE (TUBP);  Surgeon: Raynelle Bring, MD;  [facility];  Service: Urology;  Laterality: N/A;  . TRANSURETHRAL RESECTION OF BLADDER TUMOR N/A 03/22/2016   Procedure: TRANSURETHRAL RESECTION OF BLADDER TUMOR (TURBT);  Surgeon: Raynelle Bring, MD;  [facility];  Service: Urology;  Laterality: N/A;  . TRANSURETHRAL RESECTION OF BLADDER TUMOR N/A 10/20/2016   Procedure: TRANSURETHRAL RESECTION OF BLADDER TUMOR (TURBT);  Surgeon: Raynelle Bring, MD;  [facility];  Service: Urology;  Laterality: N/A;  . TRANSURETHRAL RESECTION OF BLADDER TUMOR N/A 03/16/2017   Procedure: TRANSURETHRAL RESECTION OF BLADDER TUMOR (TURBT);  Surgeon: Raynelle Bring, MD;  [facility];  Service: Urology;  Laterality: N/A;   HPI:  82 yo male admitted to [facility] with pna, pt has h/o PSP (diagnosed 08/2016) and now has right LL pna.  Pt recently has been put on Seroquel = ? if this could contribute to aspiration pna risk.  Swallow ealuation ordered as pt with frequent coughing when consuming thin water.  Pt is afebrile with WBC 5.4- lungs sounds decreased.   Pt resides with his wife and she is his  caregiver.  Pt is wheelchair bound - and wife reports recently feeding him in his hospital bed.     Assessment / Plan / Recommendation Clinical Impression  Pt presents with no indications of airway compromise with 4 ounces of juice, 8 ounces Ensure, 1 ounce sprite, 4 ounces applesauce or entire pack of graham crackers.  He did demonstrate subtle cough x1 with sequential boluses of water.  Swallow appeared timely and strong with no indications of pharyngeal residuals.   Also pt noted to belch and cough immediately post-belch x1 at end of snack- concerning for possible reflux - pt denies symptoms however.  Recommend regular/thin diet but proceed with MBS due to pt's pna.  Wife and pt informed and agreeable to plan. Educated wife and pt to other risk factors for asp pnas besides dysphagia.  Also reviewed bolus flow cup/straws that can be purchased as needed as she reports pt consumes liquids rapidly.   SLP Visit Diagnosis: Dysphagia, unspecified (R13.10)    Aspiration Risk  Mild aspiration risk    Diet Recommendation Regular;Thin liquid   Liquid Administration via: Cup;Straw Medication Administration: (as tolerated) Supervision: Full supervision/cueing for compensatory strategies Compensations: Minimize environmental distractions;Slow rate;Small sips/bites Postural Changes: Seated upright at 90 degrees;Remain upright for at least 30 minutes after po intake    Other  Recommendations Oral Care Recommendations: Oral care BID   Follow up Recommendations (tbd)      Frequency and Duration  n/a         Prognosis Prognosis for Safe Diet Advancement: Good      Swallow Study   General Date of Onset: 01/09/18 HPI: 82 yo male admitted to [facility] with pna, pt has h/o PSP (diagnosed 08/2016) and now has right LL pna.  Pt recently has been put on Seroquel = ? if this could contribute to aspiration pna risk.  Swallow ealuation ordered as pt with frequent coughing when consuming thin water.  Pt is afebrile with WBC 5.4- lungs sounds decreased.   Pt resides with his wife and she is his caregiver.  Pt is wheelchair bound - and wife reports recently feeding him in his hospital bed.   Type of Study: Bedside Swallow Evaluation Diet Prior to this Study: NPO Temperature Spikes Noted: No Respiratory Status: Room air History of Recent Intubation: No Behavior/Cognition: Alert;Cooperative;Pleasant mood Oral Cavity Assessment: Within Functional Limits Oral Care Completed by SLP:  No Oral Cavity - Dentition: Dentures, top;Adequate natural dentition Vision: Impaired for self-feeding Self-Feeding Abilities: Total assist Patient Positioning: Upright in bed Baseline Vocal Quality: Normal Volitional Cough: Weak Volitional Swallow: Unable to elicit    Oral/Motor/Sensory Function Overall Oral Motor/Sensory Function: Generalized oral weakness(? trace lingual deviation to left)   Ice Chips Ice chips: Within functional limits Presentation: Spoon   Thin Liquid Thin Liquid: Impaired Presentation: Cup;Straw Pharyngeal  Phase Impairments: Cough - Immediate Other Comments: immediate subtle cough with water, no indications of dysphagia or aspiration with apple juice or soda    Nectar Thick Nectar Thick Liquid: WFL    Honey Thick Honey Thick Liquid: Not tested   Puree Puree: Within functional limits Presentation: Spoon   Solid   GO   Solid: Within functional limits        Macario Golds 01/09/2018,3:41 PM   Luanna Salk, [facility] SLP [phone]

## 2018-01-10 ENCOUNTER — Inpatient Hospital Stay (HOSPITAL_COMMUNITY): Payer: Medicare Other

## 2018-01-10 DIAGNOSIS — N183 Chronic kidney disease, stage 3 (moderate): Secondary | ICD-10-CM

## 2018-01-10 DIAGNOSIS — I10 Essential (primary) hypertension: Secondary | ICD-10-CM

## 2018-01-10 DIAGNOSIS — E870 Hyperosmolality and hypernatremia: Secondary | ICD-10-CM

## 2018-01-10 DIAGNOSIS — N189 Chronic kidney disease, unspecified: Secondary | ICD-10-CM

## 2018-01-10 DIAGNOSIS — Z86718 Personal history of other venous thrombosis and embolism: Secondary | ICD-10-CM

## 2018-01-10 DIAGNOSIS — D649 Anemia, unspecified: Secondary | ICD-10-CM

## 2018-01-10 DIAGNOSIS — D631 Anemia in chronic kidney disease: Secondary | ICD-10-CM

## 2018-01-10 DIAGNOSIS — N179 Acute kidney failure, unspecified: Secondary | ICD-10-CM

## 2018-01-10 LAB — CBC
HCT: 24.7 % — ABNORMAL LOW (ref 39.0–52.0)
Hemoglobin: 7.9 g/dL — ABNORMAL LOW (ref 13.0–17.0)
MCH: 29.2 pg (ref 26.0–34.0)
MCHC: 32 g/dL (ref 30.0–36.0)
MCV: 91.1 fL (ref 78.0–100.0)
PLATELETS: 211 10*3/uL (ref 150–400)
RBC: 2.71 MIL/uL — ABNORMAL LOW (ref 4.22–5.81)
RDW: 14 % (ref 11.5–15.5)
WBC: 4.4 10*3/uL (ref 4.0–10.5)

## 2018-01-10 LAB — BASIC METABOLIC PANEL
Anion gap: 6 (ref 5–15)
BUN: 26 mg/dL — AB (ref 8–23)
CO2: 23 mmol/L (ref 22–32)
CREATININE: 1.5 mg/dL — AB (ref 0.61–1.24)
Calcium: 8.6 mg/dL — ABNORMAL LOW (ref 8.9–10.3)
Chloride: 114 mmol/L — ABNORMAL HIGH (ref 98–111)
GFR calc Af Amer: 49 mL/min — ABNORMAL LOW (ref >60)
GFR, EST NON AFRICAN AMERICAN: 42 mL/min — AB (ref >60)
Glucose, Bld: 99 mg/dL (ref 70–99)
Potassium: 3.6 mmol/L (ref 3.5–5.1)
SODIUM: 143 mmol/L (ref 135–145)

## 2018-01-10 MED ORDER — CITALOPRAM HYDROBROMIDE 20 MG PO TABS
20.0000 mg | ORAL_TABLET | Freq: Every day | ORAL | Status: DC
  Administered 2018-01-10 – 2018-01-12 (×3): 20 mg via ORAL
  Filled 2018-01-10 (×3): qty 1

## 2018-01-10 MED ORDER — AMOXICILLIN-POT CLAVULANATE 400-57 MG/5ML PO SUSR
400.0000 mg | Freq: Two times a day (BID) | ORAL | Status: DC
  Administered 2018-01-10 – 2018-01-12 (×5): 400 mg via ORAL
  Filled 2018-01-10 (×6): qty 5

## 2018-01-10 MED ORDER — AMPICILLIN-SULBACTAM SODIUM 3 (2-1) G IJ SOLR
3.0000 g | Freq: Four times a day (QID) | INTRAMUSCULAR | Status: DC
  Administered 2018-01-10: 3 g via INTRAVENOUS
  Filled 2018-01-10: qty 3

## 2018-01-10 MED ORDER — SODIUM CHLORIDE 0.9 % IV SOLN
INTRAVENOUS | Status: AC
  Administered 2018-01-10: 09:00:00 via INTRAVENOUS

## 2018-01-10 NOTE — Progress Notes (Signed)
Pharmacy Antibiotic Note  Brian Montgomery is a 82 y.o. male admitted on 01/08/2018 with pneumonia- concern for aspiration.  Pharmacy has been consulted for ampicillin/sulbactam dosing.  This is day #3 of IV antibiotics.  01/10/2018:  Afebrile  No leukocytosis  SCr improved, CrCl ~ 33 mL/min  Plan:  Given improvement in renal function, will increase dose to ampicillin/sulbactam 3 g IV q6h  Continue to monitor renal function and adjust dose if necessary  Weight: 154 lb (69.9 kg)  Temp (24hrs), Avg:98 F (36.7 C), Min:97.8 F (36.6 C), Max:98.2 F (36.8 C)  Recent Labs  Lab 01/08/18 1029 01/09/18 0556 01/10/18 0545  WBC 6.7 5.4 4.4  CREATININE 1.92* 1.70* 1.50*    Estimated Creatinine Clearance: 32.9 mL/min (A) (by C-G formula based on SCr of 1.5 mg/dL (H)).    No Known Allergies  Antimicrobials this admission: 7/1 Rocephin/Zithromax x1  7/1 ampicillin/sulbactam >>   Dose adjustments this admission: 7/3: Ampicillin/sulbactam 3 g IV q12 --> ampicillin/sulbactam 3 g IV q6  Microbiology results: (not collected prior to initial abx dose) 7/1 BCx: No growth < 24 hrs 7/1 Sputum:  Sent 7/1 MRSA PCR: Negative  Thank you for allowing pharmacy to be a part of this patient's care.  Theodis Shove, PharmD, BCPS Clinical Pharmacist Pager: [phone] 01/10/18 8:07 AM

## 2018-01-10 NOTE — Consult Note (Signed)
Modified Barium Swallow Progress Note  Patient Details  Name: Brian Montgomery MRN: [id number] Date of Birth: [birth year]  Today's Date: 01/10/2018  Modified Barium Swallow completed.  Full report located under Chart Review in the Imaging Section.  Brief recommendations include the following:  Clinical Impression Pt was given trials of thin liquid via cup and straw, puree, solid, and barium tablet. He presents with mild oropharyngeal dysphagia, characterized orally by poor bolus formation with premature spillage to the vallecular sinus across consistencies. Pharyngeally, pt exhibits a delayed swallow reflex with trigger at the vallecula, due to decreased sensation for primary trigger site. Trace penetration was seen intermittently during the swallow of thin liquids, however, penetrate cleared spontaneously and was not aspirated, despite challenging pt with large consecutive boluses via cup and straw.  This is considered within normal limits for pt age. No post-swallow residue was seen on any consistency. Barium tablet was given with water, and cleared the oropharynx without difficulty or delay.   Recommend continuing with regular solids and thin liquids, meds whole one at a time with liquid. Safe swallow precautions were reviewed and provided in written form to be posted at [facility]. SLP will follow briefly for education.   Swallow Evaluation Recommendations  SLP Diet Recommendations: Regular solids;Thin liquid   Liquid Administration via: Cup;Straw   Medication Administration: Whole meds with liquid   Supervision: Patient able to self feed;Staff to assist with self feeding   Compensations: Minimize environmental distractions;Slow rate;Small sips/bites   Postural Changes: Remain semi-upright after after feeds/meals (Comment);Seated upright at 90 degrees   Oral Care Recommendations: (TID)  Celia B. Quentin Ore [facility], [place] Speech Language Pathologist [phone]  Shonna Chock 01/10/2018,12:49  PM

## 2018-01-10 NOTE — Progress Notes (Signed)
[facility] PROGRESS NOTE    Progress Note  Brian Montgomery  MBW:[id number] DOB: 1935-08-04 DOA: 01/08/2018 PCP: Brian Lung, MD     Brief Narrative:   Brian Montgomery is an 82 y.o. male past medical history significant for essential hypertension, chronic kidney disease stage III, bladder and kidney cancer, DVT on Xarelto and dementia completely dependent for daily activities comes into with confusion, likely due to aspiration pneumonia  Assessment/Plan:    Aspiration pneumonia of right lower lobe ([place]): On admission her chest x-ray showed right lower lobe infiltrates. Continue Augmentin. Speech was consulted and plan for an MBS on 01/10/2018.  Normocytic anemia: With a baseline hemoglobin around 9, today 7.9  Acute kidney injury due to CKD (chronic kidney disease) stage 3, GFR 30-59 ml/min ([place]): With a baseline around 1.5 on admission 1.9, likely prerenal etiology, after IV fluid hydration is gently trending down. Metabolic panel in the morning Continue to hold lisinopril and hydrochlorothiazide.  Hypovolemic hypernatremia: Resolved with IV fluid hydration.  Essential hypertension, benign Hold antihypertensive medication  History of DVT: Continue Eliquis.  History of bladder cancer status post radiation therapy: Follow-up with urology as an outpatient.  Dementia Continue current medication.  DVT prophylaxis: eliquis Family Communication:none Disposition Plan/Barrier to D/C: home in 1 day Code Status:     Code Status Orders  (From admission, onward)        Start     Ordered   01/08/18 1358  Full code  Continuous     01/08/18 1401    Code Status History    Date Active Date Inactive Code Status Order ID Comments User Context   11/23/2017 1810 11/24/2017 1310 Full Code [id number]  Raynelle Bring, MD Inpatient   06/26/2017 1734 06/27/2017 1525 Full Code [id number]  Raynelle Bring, MD Inpatient   03/16/2017 1516 03/17/2017 1324 Full Code [id number]  Raynelle Bring, MD  Inpatient   06/30/2016 2016 07/03/2016 1440 Full Code [id number]  Elwin Mocha, MD Inpatient   06/13/2016 1934 06/17/2016 1930 Full Code [id number]  Eugenie Filler, MD Inpatient   01/21/2016 2024 01/22/2016 1624 Full Code [id number]  Raynelle Bring, MD Inpatient    Advance Directive Documentation     Most Recent Value  Type of Advance Directive  Healthcare Power of Attorney  Pre-existing out of facility DNR order (yellow form or pink MOST form)  -  "MOST" Form in Place?  -        IV Access:    Peripheral IV   Procedures and diagnostic studies:   Dg Chest 2 View  Result Date: 01/08/2018 CLINICAL DATA:  Worsening altered mental status and lethargy. History of Parkinson's disease EXAM: CHEST - 2 VIEW COMPARISON:  Chest x-ray of June 30, 2016 FINDINGS: The lungs are less well inflated. The interstitial markings are increased especially in the right mid and lower Montgomery. The heart is normal in size. The pulmonary vascularity is not clearly engorged. The trachea is midline. The bony thorax exhibits no acute abnormality. IMPRESSION: Increased density predominantly in the right lower lobe worrisome for pneumonia. No overt CHF. Followup PA and lateral chest X-ray is recommended in 3-4 weeks following trial of antibiotic therapy to ensure resolution and exclude underlying malignancy. Electronically Signed   By: David  Martinique M.D.   On: 01/08/2018 11:11   Ct Head Wo Contrast  Result Date: 01/08/2018 CLINICAL DATA:  82 year old male with altered mental status. Parkinson's disease. Dementia. Hypertension. Initial encounter. EXAM: CT HEAD WITHOUT CONTRAST TECHNIQUE: Contiguous axial images were obtained  from the base of the skull through the vertex without intravenous contrast. COMPARISON:  09/21/2017 head CT. FINDINGS: Brain: No intracranial hemorrhage or CT evidence of large acute infarct. Remote left caudate head infarct. Prominent chronic microvascular changes. Moderate global atrophy. No  intracranial mass lesion noted on this unenhanced exam. Vascular: Vascular calcifications. Skull: No acute abnormality. Sinuses/Orbits: No acute orbital abnormality. Mild mucosal thickening right maxillary sinus and minimal mucosal thickening left maxillary sinus. Other: Mastoid air cells and middle ear cavities are clear. IMPRESSION: No acute intracranial abnormality. Remote left caudate head infarct. Prominent chronic microvascular changes. Moderate global atrophy. Mild mucosal thickening right maxillary sinus and minimal mucosal thickening left maxillary sinus. Electronically Signed   By: Genia Del M.D.   On: 01/08/2018 11:21     Medical Consultants:    None.  Anti-Infectives:   augmentin  Subjective:    Brian Montgomery she relates she feels better than yesterday her breathing is much improved.  Objective:    Vitals:   01/09/18 0501 01/09/18 1400 01/09/18 1954 01/10/18 0516  BP: 102/65 121/68 111/69 96/68  Pulse: 74 67 75 75  Resp: 18 20 18 18   Temp: (!) 97.5 F (36.4 C) 97.9 F (36.6 C) 98.2 F (36.8 C) 97.8 F (36.6 C)  TempSrc: Oral Oral    SpO2: 100% 100% 100% 98%  Weight:        Intake/Output Summary (Last 24 hours) at 01/10/2018 0819 Last data filed at 01/09/2018 1604 Gross per 24 hour  Intake 100 ml  Output -  Net 100 ml   Filed Weights   01/08/18 1018  Weight: 69.9 kg (154 lb)    Exam: General exam: In no acute distress. Respiratory system: Good air movement and clear to auscultation. Cardiovascular system: S1 & S2 heard, RRR.  Gastrointestinal system: Abdomen is nondistended, soft and nontender.  Central nervous system: Alert and oriented. No focal neurological deficits. Extremities: No pedal edema. Skin: No rashes, lesions or ulcers Psychiatry: Judgement and insight appear normal. Mood & affect appropriate.    Data Reviewed:    Labs: Basic Metabolic Panel: Recent Labs  Lab 01/08/18 1029 01/09/18 0556 01/10/18 0545  NA 145 146* 143  K 3.7  3.8 3.6  CL 117* 116* 114*  CO2 17* 24 23  GLUCOSE 111* 86 99  BUN 31* 29* 26*  CREATININE 1.92* 1.70* 1.50*  CALCIUM 9.3 8.9 8.6*   GFR Estimated Creatinine Clearance: 32.9 mL/min (A) (by C-G formula based on SCr of 1.5 mg/dL (H)). Liver Function Tests: Recent Labs  Lab 01/08/18 1029  AST 14*  ALT <5  ALKPHOS 65  BILITOT 0.5  PROT 7.3  ALBUMIN 3.7   No results for input(s): LIPASE, AMYLASE in the last 168 hours. No results for input(s): AMMONIA in the last 168 hours. Coagulation profile No results for input(s): INR, PROTIME in the last 168 hours.  CBC: Recent Labs  Lab 01/08/18 1029 01/09/18 0556 01/10/18 0545  WBC 6.7 5.4 4.4  HGB 8.9* 7.6* 7.9*  HCT 27.1* 24.1* 24.7*  MCV 90.3 91.6 91.1  PLT 235 208 211   Cardiac Enzymes: Recent Labs  Lab 01/08/18 1029  TROPONINI <0.03   BNP (last 3 results) No results for input(s): PROBNP in the last 8760 hours. CBG: Recent Labs  Lab 01/08/18 1126  GLUCAP 103*   D-Dimer: No results for input(s): DDIMER in the last 72 hours. Hgb A1c: No results for input(s): HGBA1C in the last 72 hours. Lipid Profile: No results for input(s): CHOL,  HDL, LDLCALC, TRIG, CHOLHDL, LDLDIRECT in the last 72 hours. Thyroid function studies: No results for input(s): TSH, T4TOTAL, T3FREE, THYROIDAB in the last 72 hours.  Invalid input(s): FREET3 Anemia work up: No results for input(s): VITAMINB12, FOLATE, FERRITIN, TIBC, IRON, RETICCTPCT in the last 72 hours. Sepsis Labs: Recent Labs  Lab 01/08/18 1029 01/08/18 1531 01/09/18 0556 01/10/18 0545  PROCALCITON  --  <0.10  --   --   WBC 6.7  --  5.4 4.4   Microbiology Recent Results (from the past 240 hour(s))  Culture, blood (Routine X 2) w Reflex to ID Panel     Status: None (Preliminary result)   Collection Time: 01/08/18  3:31 PM  Result Value Ref Range Status   Specimen Description   Final    BLOOD RIGHT HAND THUMB Performed at [facility] [address], [place], [place] [address]    Special Requests   Final    BOTTLES DRAWN AEROBIC AND ANAEROBIC Blood Culture adequate volume Performed at [facility] [address] [place], [place] [address]    Culture   Final    NO GROWTH < 24 HOURS Performed at [facility] [address] [place], [place] [address]    Report Status PENDING  Incomplete  Culture, blood (Routine X 2) w Reflex to ID Panel     Status: None (Preliminary result)   Collection Time: 01/08/18  3:37 PM  Result Value Ref Range Status   Specimen Description   Final    BLOOD RIGHT HAND Performed at [facility] [address], [place], [place] [address]    Special Requests   Final    BOTTLES DRAWN AEROBIC AND ANAEROBIC Blood Culture adequate volume Performed at [facility] [address], [place], [place] [address]    Culture   Final    NO GROWTH < 24 HOURS Performed at [facility] [address] [place], [place] [address]    Report Status PENDING  Incomplete  MRSA PCR Screening     Status: None   Collection Time: 01/09/18  6:25 AM  Result Value Ref Range Status   MRSA by PCR NEGATIVE NEGATIVE Final    Comment:        The GeneXpert MRSA Assay (FDA approved for NASAL specimens only), is one component of a comprehensive MRSA colonization surveillance program. It is not intended to diagnose MRSA infection nor to guide or monitor treatment for MRSA infections. Performed at [facility], [place] [address] [place], [place] [address]      Medications:   . apixaban  5 mg Oral BID  . carbidopa-levodopa  1.5 tablet Oral TID  . QUEtiapine  50 mg Oral QHS  . simvastatin  20 mg Oral q1800   Continuous Infusions: . sodium chloride 75 mL/hr at 01/09/18 1604  . ampicillin-sulbactam (UNASYN) IV        LOS: 2 days   [facility] Pager [phone]  *Please refer to [email or web address], password [email or web address] to get  updated schedule on who will round on this patient, as hospitalists switch teams weekly. If 7PM-7AM, please contact night-coverage at [email or web address], password [email or web address] for any overnight needs.  01/10/2018, 8:19 AM

## 2018-01-11 DIAGNOSIS — Z515 Encounter for palliative care: Secondary | ICD-10-CM

## 2018-01-11 DIAGNOSIS — Z7189 Other specified counseling: Secondary | ICD-10-CM

## 2018-01-11 DIAGNOSIS — F0391 Unspecified dementia with behavioral disturbance: Secondary | ICD-10-CM

## 2018-01-11 DIAGNOSIS — J690 Pneumonitis due to inhalation of food and vomit: Principal | ICD-10-CM

## 2018-01-11 NOTE — Progress Notes (Signed)
  Speech Language Pathology Treatment:    Patient Details Name: Brian Montgomery MRN: [id number] DOB: 1935-12-19 Today's Date: 01/11/2018 Time: 4098-1191 SLP Time Calculation (min) (ACUTE ONLY): 24 min  Assessment / Plan / Recommendation Clinical Impression  Pt and family educated to findings of MBS with use of video.  In addition, provided written information regarding bolus flow control straws to help pt with independence with swallowing as dysphagia progresses.  Wife and daughters present, wife reports pt with NO coughing with intake today and daughter reported NO coughing with soda yesterday.   Observed pt self feeding chicken strip *daughter put in his hand* with adequate mastication.  No indication of severe dysphagia. Reviewed multiple risk factors for aspiration pneumonias including relying on others for feeding,decreased mobility status, etc.  All education completed and family given opportunity to ask questions. Thanks for this consult.   HPI HPI: 82 yo male admitted to [facility] with pna, pt has h/o PSP (diagnosed 08/2016) and now has right LL pna.  Pt recently has been put on Seroquel = ? if this could contribute to aspiration pna risk.  Swallow ealuation ordered as pt with frequent coughing when consuming thin water.  Pt is afebrile with WBC 5.4- lungs sounds decreased.   Pt resides with his wife and she is his caregiver.  Pt is wheelchair bound - and wife reports recently feeding him in his hospital bed.        SLP Plan  All goals met       Recommendations  Diet recommendations: Regular;Thin liquid Liquids provided via: Cup;Straw Medication Administration: (as tolerated) Supervision: Staff to assist with self feeding Compensations: Minimize environmental distractions;Slow rate;Small sips/bites Postural Changes and/or Swallow Maneuvers: Seated upright 90 degrees;Upright 30-60 min after meal                Oral Care Recommendations: Oral care BID Follow up Recommendations: None SLP  Visit Diagnosis: Dysphagia, unspecified (R13.10) Plan: All goals met       GO                Macario Golds 01/11/2018, 2:44 PM  Luanna Salk, [facility] SLP [phone]

## 2018-01-11 NOTE — Consult Note (Signed)
Consultation Note Date: 01/11/2018   Patient Name: Brian Montgomery  DOB: 1936/05/22  MRN: [id number]  Age / Sex: 82 y.o., male  PCP: Brian Lung, MD Referring Physician: Aileen Montgomery, Brian Klippel, MD  Reason for Consultation: Establishing goals of care  HPI/Patient Profile: 82 y.o. male  with past medical history of HTN, bladder cancer (s/p TURBT 2017, radiation and chemo- no recurrence currently, has uretal stents from obstruction r/t radiation tx that were replaced 11/2017), Parkinson's dementia, HTN admitted on 01/08/2018 with pneumonia (questionably aspiration- per SLP no sign of aspiration). Palliative medicine consulted for Brian Montgomery.   Clinical Assessment and Goals of Care:  I have reviewed medical records including EPIC notes, labs and imaging, assessed the patient and then met at the bedside along with his wife and daughter to discuss diagnosis prognosis, GOC, EOL wishes, disposition and options.  I introduced Palliative Medicine as specialized medical care for people living with serious illness. It focuses on providing relief from the symptoms and stress of a serious illness. The goal is to improve quality of life for both the patient and the family.  We discussed a brief life review of the patient.  He is a retired Electrical engineer. He used to be very active, enjoyed gardening and spending time with his family.  As far as functional and nutritional status - he is wheelchair bound, requires assistance with all ADL's. He is able to converse with family members. He eats a great deal. There has not been significant changes in his functional, nutritional or cognitive status in the last several months. His wife and daughter note he has stayed pretty stable for the last 2  Years.     We discussed their current illness and what it means in the larger context of their on-going co-morbidities.  Natural disease trajectory and  expectations at EOL were discussed. They understand he has a life limiting illness. The patient's advanced directives were reviewed. There is a copy on file in his chart. He does not desire extreme life prolonging measures, or a feeding tube. He desires DO NOT RESUSCITATE code status.   The difference between aggressive medical intervention and comfort care was considered in light of the patient's goals of care. Family at this point wishes to continue life prolonging care including return visits to the Montgomery, IV fluids, and medications.   Hospice and Palliative Care services outpatient were explained and offered. They would like Palliative Care services to follow them outpatient. They are interested in patient going to SNF at discharge.   Questions and concerns were addressed. The family was encouraged to call with questions or concerns.   Primary Decision Maker HCPOA- patient's wife - Brian Montgomery    SUMMARY OF RECOMMENDATIONS -DNR -D/C to SNF with Palliative    Code Status/Advance Care Planning:  DNR  Prognosis:    Unable to determine  Discharge Planning: [name] for rehab with Palliative care service follow-up  Primary Diagnoses: Present on Admission: . Aspiration pneumonia of right lower lobe (Brian Montgomery) . Dementia . CKD (  chronic kidney disease) stage 3, GFR 30-59 ml/min (HCC) . Essential hypertension, benign   I have reviewed the medical record, interviewed the patient and family, and examined the patient. The following aspects are pertinent.  Past Medical History:  Diagnosis Date  . Arthritis   . Bladder cancer (Brian Montgomery)    infiltrative high grade papillary urothelial carcinoma   . Dementia    mild   . Dyslipidemia   . ED (erectile dysfunction)   . Hypertension   . 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 Parkinson disease (Brian Montgomery)   . Pneumonia    x1 in adulthood   . Prostate cancer Brian Montgomery)    Social History   Socioeconomic History  . Marital  status: Married    Spouse name: Not on file  . Number of children: Not on file  . Years of education: Not on file  . Highest education level: Not on file  Occupational History  . Not on file  Social Needs  . Financial resource strain: Not on file  . Food insecurity:    Worry: Not on file    Inability: Not on file  . Transportation needs:    Medical: Not on file    Non-medical: Not on file  Tobacco Use  . Smoking status: Former Smoker    Packs/day: 0.25    Years: 2.00    Pack years: 0.50    Types: Cigarettes    Last attempt to quit: 07/12/1967    Years since quitting: 50.5  . Smokeless tobacco: Never Used  Substance and Sexual Activity  . Alcohol use: No    Alcohol/week: 0.0 oz  . Drug use: No  . Sexual activity: Not Currently    Partners: Female  Lifestyle  . Physical activity:    Days per week: Not on file    Minutes per session: Not on file  . Stress: Not on file  Relationships  . Social connections:    Talks on phone: Not on file    Gets together: Not on file    Attends religious service: Not on file    Active member of club or organization: Not on file    Attends meetings of clubs or organizations: Not on file    Relationship status: Not on file  Other Topics Concern  . Not on file  Social History Narrative  . Not on file   Family History  Problem Relation Age of Onset  . Heart failure Mother   . Hypertension Mother   . Heart failure Father   . Hypertension Father   . Renal Disease Sister   . Diabetes Brother    Scheduled Meds: . amoxicillin-clavulanate  400 mg Oral Q12H  . apixaban  5 mg Oral BID  . carbidopa-levodopa  1.5 tablet Oral TID  . citalopram  20 mg Oral Daily  . QUEtiapine  50 mg Oral QHS  . simvastatin  20 mg Oral q1800   Continuous Infusions: PRN Meds:.acetaminophen **OR** acetaminophen, guaiFENesin-dextromethorphan, ondansetron **OR** ondansetron (ZOFRAN) IV, senna-docusate Medications Prior to Admission:  Prior to Admission  medications   Medication Sig Start Date End Date Taking? Authorizing Provider  acetaminophen (TYLENOL) 500 MG tablet Take 500 mg by mouth every 6 (six) hours as needed for moderate pain or headache.   Yes [provider]  apixaban (ELIQUIS) 5 MG TABS tablet Take 1 tablet (5 mg total) by mouth 2 (two) times daily. 11/15/17  Yes Brian Lung, MD  Artificial  Tear Ointment (DRY EYES OP) Place 1 drop into both eyes as needed (for dry eyes).    Yes [provider]  carbidopa-levodopa (SINEMET IR) 25-100 MG tablet Take 1.5 tablets by mouth 3 (three) times daily. 03/14/17  Yes Jaffe, Adam R, DO  citalopram (CELEXA) 20 MG tablet TAKE 1 TABLET(20 MG) BY MOUTH DAILY 10/24/17  Yes Brian Lung, MD  lisinopril-hydrochlorothiazide (PRINZIDE,ZESTORETIC) 10-12.5 MG tablet Take 1 tablet by mouth daily. 07/12/17  Yes Brian Lung, MD  Melatonin 3 MG CAPS Take 3 mg by mouth every other day as needed (for sleep.).    Yes [provider]  pseudoephedrine-guaifenesin (MUCINEX D) 60-600 MG 12 hr tablet Take 1 tablet by mouth at bedtime as needed for congestion.    Yes [provider]  QUEtiapine (SEROQUEL) 50 MG tablet TAKE 1 TABLET BY MOUTH AT BEDTIME 12/27/17  Yes Jaffe, Adam R, DO  simvastatin (ZOCOR) 20 MG tablet TAKE 1 TABLET BY MOUTH DAILY 11/29/17  Yes Brian Lung, MD  donepezil (ARICEPT) 10 MG tablet TAKE 1 TABLET(10 MG) BY MOUTH AT BEDTIME Patient not taking: Reported on 11/10/2017 08/28/17   Pieter Partridge, DO   No Known Allergies Review of Systems  Physical Exam  Vital Signs: BP 109/67 (BP Location: Left Arm)   Pulse 63   Temp 97.7 F (36.5 C) (Oral)   Resp 18   Wt 69.9 kg (154 lb)   SpO2 100%   BMI 26.43 kg/m  Pain Scale: 0-10   Pain Score: Asleep   SpO2: SpO2: 100 % O2 Device:SpO2: 100 % O2 Flow Rate: .   IO: Intake/output summary:   Intake/Output Summary (Last 24 hours) at 01/11/2018 1125 Last data filed at 01/11/2018 0600 Gross per 24 hour  Intake  1600.67 ml  Output 850 ml  Net 750.67 ml    LBM: Last BM Date: 01/08/18 Baseline Weight: Weight: 69.9 kg (154 lb) Most recent weight: Weight: 69.9 kg (154 lb)     Palliative Assessment/Data: PPS: 30%     Thank you for this consult. Palliative medicine will continue to follow and assist as needed.   Time In: 1015 Time Out: 1130 Time Total: 75 minutes Greater than 50%  of this time was spent counseling and coordinating care related to the above assessment and plan.  Signed by: Mariana Kaufman, AGNP-C Palliative Medicine    Please contact Palliative Medicine Team phone at [phone] for questions and concerns.  For individual provider: See Shea Evans

## 2018-01-11 NOTE — Evaluation (Signed)
Occupational Therapy Evaluation Patient Details Name: Brian Montgomery MRN: [id number] DOB: 1936-04-29 Today's Date: 01/11/2018    History of Present Illness Pt was admitted for aspiration pna.  PMH:  HTN, CKD stage III, bladder and kidney CA,   Clinical Impression   This 82 year old man was admitted for the above. At baseline, he needs assistance for all ADLs. He needs max A for mobility.  Will follow in acute setting focusing on mobility for adls to decrease burden of care.    Follow Up Recommendations  SNF    Equipment Recommendations  None recommended by OT    Recommendations for Other Services       Precautions / Restrictions Precautions Precautions: Fall Restrictions Weight Bearing Restrictions: No      Mobility Bed Mobility Overal bed mobility: Needs Assistance Bed Mobility: Rolling;Sidelying to Sit Rolling: Total assist Sidelying to sit: Total assist;+2 for physical assistance       General bed mobility comments: Pt 10%  Transfers Overall transfer level: Needs assistance Equipment used: 2 person hand held assist Transfers: Sit to/from [employer] Transfers Sit to Stand: Mod assist;+2 physical assistance;From elevated surface Stand pivot transfers: Mod assist;+2 physical assistance       General transfer comment: assist to power up and stabilize.   Assistance to place hands and control descent    Balance Overall balance assessment: History of Falls;Needs assistance Sitting-balance support: Feet supported Sitting balance-Leahy Scale: Poor Sitting balance - Comments: balance improved with time; min to min guard assistance                                   ADL either performed or assessed with clinical judgement   ADL Overall ADL's : At baseline                                       General ADL Comments: Pt has assistance with all ADLs.       Vision         Perception     Praxis      Pertinent Vitals/Pain  Pain Assessment: No/denies pain     Hand Dominance     Extremity/Trunk Assessment Upper Extremity Assessment Upper Extremity Assessment: Generalized weakness;LUE deficits/detail;RUE deficits/detail RUE Deficits / Details: shoulder weakness 2+/5. Distally 3+/5; grip 4/5 LUE Deficits / Details: cannot lift LUE beyond 10 degrees; AAROM tolerated to 30.  Injured in a fall per wife.  Grip 4/5           Communication Communication Communication: No difficulties   Cognition Arousal/Alertness: Awake/alert Behavior During Therapy: WFL for tasks assessed/performed Overall Cognitive Status: History of cognitive impairments - at baseline                                 General Comments: mild dementia per chart. Extra processing time and multimodal cues at times   General Comments  BP WFLs sitting EOB 121/77    Exercises Exercises: (AAROM to bil shoulders)   Shoulder Instructions      Home Living Family/patient expects to be discharged to:: Private residence Living Arrangements: Spouse/significant other Available Help at Discharge: Family               Bathroom Shower/Tub: (sponge bathes in bed)   Bathroom  Toilet: (uses depends)     Home Equipment: Wheelchair - power;Hospital bed   Additional Comments: someone operates power chair for him      Prior Functioning/Environment          Comments: family helps a lot with getting out of bed/chair:  one person.  Uses w/c. Family helps with all ADLs--bed level including rolling to change depends        OT Problem List: Decreased strength;Decreased activity tolerance;Decreased range of motion;Impaired balance (sitting and/or standing);Decreased cognition      OT Treatment/Interventions: Therapeutic exercise;Therapeutic activities;Cognitive remediation/compensation;Balance training;Patient/family education    OT Goals(Current goals can be found in the care plan section) Acute Rehab OT Goals Patient Stated  Goal: get stronger OT Goal Formulation: With family Time For Goal Achievement: 01/25/18 Potential to Achieve Goals: Fair ADL Goals Additional ADL Goal #1: pt will roll to L with max A +2 for adls Additional ADL Goal #2: pt will sit unsupported EOB x 5 minutes with min guard for adls  OT Frequency: Min 2X/week   Barriers to D/C:            Co-evaluation PT/OT/SLP Co-Evaluation/Treatment: Yes Reason for Co-Treatment: For patient/therapist safety PT goals addressed during session: Mobility/safety with mobility OT goals addressed during session: Strengthening/ROM      AM-PAC PT "6 Clicks" Daily Activity     Outcome Measure Help from another person eating meals?: Total Help from another person taking care of personal grooming?: Total Help from another person toileting, which includes using toliet, bedpan, or urinal?: Total Help from another person bathing (including washing, rinsing, drying)?: Total Help from another person to put on and taking off regular upper body clothing?: Total Help from another person to put on and taking off regular lower body clothing?: Total 6 Click Score: 6   End of Session Nurse Communication: Need for lift equipment(maximove pad in chair)  Activity Tolerance: Patient tolerated treatment well Patient left: in chair;with call bell/phone within reach;with chair alarm set;with family/visitor present  OT Visit Diagnosis: Muscle weakness (generalized) (M62.81);Unsteadiness on feet (R26.81)                Time: 6389-3734 OT Time Calculation (min): 31 min Charges:  OT General Charges $OT Visit: 1 Visit OT Evaluation $OT Eval Low Complexity: 1 Low G-Codes:     [place], OTR/L [phone] 01/11/2018  Brian Montgomery 01/11/2018, 1:11 PM

## 2018-01-11 NOTE — Evaluation (Signed)
Physical Therapy Evaluation Patient Details Name: Brian Montgomery MRN: [id number] DOB: [birth year] Today's Date: 01/11/2018   History of Present Illness  Pt was admitted for aspiration pna.  PMH:  HTN, CKD stage III, bladder and kidney CA,  Clinical Impression  Pt admitted as above and presenting with functional mobility limitations 2* generalized weakness, ROM limitations and balance deficits.  Pt will benefit from follow up rehab at SNF level to maximize IND and safety.    Follow Up Recommendations SNF    Equipment Recommendations  None recommended by PT    Recommendations for Other Services       Precautions / Restrictions Precautions Precautions: Fall Restrictions Weight Bearing Restrictions: No      Mobility  Bed Mobility Overal bed mobility: Needs Assistance Bed Mobility: Rolling;Sidelying to Sit Rolling: Total assist Sidelying to sit: Total assist;+2 for physical assistance       General bed mobility comments: Pt 10%  Transfers Overall transfer level: Needs assistance Equipment used: 2 person hand held assist Transfers: Sit to/from [employer] Transfers Sit to Stand: Mod assist;+2 physical assistance;From elevated surface Stand pivot transfers: Mod assist;+2 physical assistance       General transfer comment: assist to power up and stabilize.   Assistance to place hands and control descent  Ambulation/Gait             General Gait Details: Pt is non-ambulatory at this time  Stairs            Wheelchair Mobility    Modified Rankin (Stroke Patients Only)       Balance Overall balance assessment: History of Falls;Needs assistance Sitting-balance support: Feet supported Sitting balance-Leahy Scale: Poor Sitting balance - Comments: balance improved with time; min to min guard assistance     Standing balance-Leahy Scale: Poor                               Pertinent Vitals/Pain Pain Assessment: No/denies pain    Home  Living Family/patient expects to be discharged to:: Skilled nursing facility Living Arrangements: Spouse/significant other Available Help at Discharge: Family           Home Equipment: Wheelchair - power;Hospital bed Additional Comments: someone operates power chair for him    Prior Function Level of Independence: Needs assistance   Gait / Transfers Assistance Needed: transfers bed<>chair only  ADL's / Homemaking Assistance Needed: Family provides all care  Comments: family helps a lot with getting out of bed/chair:  one person.  Uses w/c. Family helps with all ADLs--bed level including rolling to change depends     Hand Dominance        Extremity/Trunk Assessment   Upper Extremity Assessment Upper Extremity Assessment: Generalized weakness;Defer to OT evaluation RUE Deficits / Details: shoulder weakness 2+/5. Distally 3+/5; grip 4/5 LUE Deficits / Details: cannot lift LUE beyond 10 degrees; AAROM tolerated to 30.  Injured in a fall per wife.  Grip 4/5    Lower Extremity Assessment Lower Extremity Assessment: Generalized weakness;RLE deficits/detail;LLE deficits/detail RLE Deficits / Details: Strength ~3/5 with ltd movement all joints but WFL LLE Deficits / Details: Strength ~ 2+/5 with ltd movement all joints    Cervical / Trunk Assessment Cervical / Trunk Assessment: Kyphotic  Communication   Communication: No difficulties  Cognition Arousal/Alertness: Awake/alert Behavior During Therapy: WFL for tasks assessed/performed Overall Cognitive Status: History of cognitive impairments - at baseline  General Comments: mild dementia per chart. Extra processing time and multimodal cues at times      General Comments General comments (skin integrity, edema, etc.): BP WFLs sitting EOB 121/77    Exercises     Assessment/Plan    PT Assessment Patient needs continued PT services  PT Problem List Decreased strength;Decreased  range of motion;Decreased activity tolerance;Decreased balance;Decreased mobility;Decreased cognition;Decreased knowledge of use of DME       PT Treatment Interventions DME instruction;Gait training;Functional mobility training;Therapeutic activities;Therapeutic exercise;Balance training;Patient/family education    PT Goals (Current goals can be found in the Care Plan section)  Acute Rehab PT Goals Patient Stated Goal: get stronger PT Goal Formulation: With patient/family Time For Goal Achievement: 01/25/18 Potential to Achieve Goals: Fair    Frequency Min 3X/week   Barriers to discharge        Co-evaluation PT/OT/SLP Co-Evaluation/Treatment: Yes Reason for Co-Treatment: For patient/therapist safety PT goals addressed during session: Mobility/safety with mobility OT goals addressed during session: Strengthening/ROM       AM-PAC PT "6 Clicks" Daily Activity  Outcome Measure Difficulty turning over in bed (including adjusting bedclothes, sheets and blankets)?: Unable Difficulty moving from lying on back to sitting on the side of the bed? : Unable Difficulty sitting down on and standing up from a chair with arms (e.g., wheelchair, bedside commode, etc,.)?: Unable Help needed moving to and from a bed to chair (including a wheelchair)?: A Lot Help needed walking in hospital room?: Total Help needed climbing 3-5 steps with a railing? : Total 6 Click Score: 7    End of Session Equipment Utilized During Treatment: Gait belt Activity Tolerance: Patient limited by fatigue Patient left: in chair;with call bell/phone within reach;with chair alarm set;with family/visitor present Nurse Communication: Mobility status PT Visit Diagnosis: Muscle weakness (generalized) (M62.81);Difficulty in walking, not elsewhere classified (R26.2)    Time: 5809-9833 PT Time Calculation (min) (ACUTE ONLY): 33 min   Charges:   PT Evaluation $PT Eval Moderate Complexity: 1 Mod     PT G Codes:         Pg [phone]   Brian Montgomery 01/11/2018, 2:16 PM

## 2018-01-11 NOTE — Progress Notes (Signed)
[facility] PROGRESS NOTE    Progress Note  [name]  VZD:[id number] DOB: January 24, 1936 DOA: 01/08/2018 PCP: Denita Lung, MD     Brief Narrative:   Brian Montgomery is an 82 y.o. male past medical history significant for essential hypertension, chronic kidney disease stage III, bladder and kidney cancer, DVT on Xarelto and dementia completely dependent for daily activities comes into with confusion, likely due to aspiration pneumonia  Assessment/Plan:    Aspiration pneumonia of right lower lobe ([place]): On admission her chest x-ray showed right lower lobe infiltrates. Continue Augmentin. Speech was consulted and plan for an MBS on 01/10/2018 showed no signs of aspiration. We will consult physical therapy and Occupational Therapy.  Normocytic anemia: With a baseline hemoglobin around 9, today 7.9  Acute kidney injury due to CKD (chronic kidney disease) stage 3, GFR 30-59 ml/min ([place]): Likely prerenal in etiology resolved with IV fluid hydration. Continue to hold lisinopril and hydrochlorothiazide.  Hypovolemic hypernatremia: Resolved with IV fluid hydration.  Essential hypertension, benign Hold antihypertensive medication  History of DVT: Continue Eliquis.  History of bladder cancer status post radiation therapy: Follow-up with urology as an outpatient.  Dementia Continue current medication.  DVT prophylaxis: eliquis Family Communication:none Disposition Plan/Barrier to D/C: hom/snfe in 1 day Code Status:     Code Status Orders  (From admission, onward)        Start     Ordered   01/08/18 1358  Full code  Continuous     01/08/18 1401    Code Status History    Date Active Date Inactive Code Status Order ID Comments User Context   11/23/2017 1810 11/24/2017 1310 Full Code [id number]  Raynelle Bring, MD Inpatient   06/26/2017 1734 06/27/2017 1525 Full Code [id number]  Raynelle Bring, MD Inpatient   03/16/2017 1516 03/17/2017 1324 Full Code [id number]  Raynelle Bring,  MD Inpatient   06/30/2016 2016 07/03/2016 1440 Full Code [id number]  Elwin Mocha, MD Inpatient   06/13/2016 1934 06/17/2016 1930 Full Code [id number]  Eugenie Filler, MD Inpatient   01/21/2016 2024 01/22/2016 1624 Full Code [id number]  Raynelle Bring, MD Inpatient    Advance Directive Documentation     Most Recent Value  Type of Advance Directive  Healthcare Power of Attorney  Pre-existing out of facility DNR order (yellow form or pink MOST form)  -  "MOST" Form in Place?  -        IV Access:    Peripheral IV   Procedures and diagnostic studies:   Dg Swallowing Func-speech Pathology  Result Date: 01/10/2018 Objective Swallowing Evaluation: Type of Study: MBS-Modified Barium Swallow Study  Patient Details Name: Brian Montgomery MRN: [id number] Date of Birth: Oct 25, 1935 Today's Date: 01/10/2018 Time: SLP Start Time (ACUTE ONLY): 1200 -SLP Stop Time (ACUTE ONLY): 1220 SLP Time Calculation (min) (ACUTE ONLY): 20 min Past Medical History: Past Medical History: Diagnosis Date . Arthritis  . Bladder cancer ([place])   infiltrative high grade papillary urothelial carcinoma  . Dementia   mild  . Dyslipidemia  . ED (erectile dysfunction)  . Hypertension  . 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 Parkinson disease ([place])  . Pneumonia   x1 in adulthood  . Prostate cancer [facility])  Past Surgical History: Past Surgical History: Procedure Laterality Date . APPENDECTOMY   . BACK SURGERY    lumbar ' years ago  . COLONOSCOPY  2007  Dr.kaplan . CYSTOSCOPY N/A 03/22/2016  Procedure: CYSTOSCOPY;  Surgeon: Raynelle Bring, MD;  Location:  [facility];  Service: Urology;  Laterality: N/A; . CYSTOSCOPY W/ RETROGRADES N/A 01/21/2016  Procedure: CYSTOSCOPY WITH TURBT WITH  CLOT EVACUATION;  Surgeon: Raynelle Bring, MD;  Location: [facility];  Service: Urology;  Laterality: N/A; . CYSTOSCOPY W/ RETROGRADES N/A 10/20/2016  Procedure: CYSTOSCOPY WITH BILATERAL RETROGRADE PYELOGRAM;  Surgeon: Raynelle Bring, MD;  Location: [facility];  Service:  Urology;  Laterality: N/A; . CYSTOSCOPY W/ URETERAL STENT PLACEMENT Right 06/26/2017  Procedure: CYSTOSCOPY WITH STENT CHANGE;  Surgeon: Raynelle Bring, MD;  Location: [facility];  Service: Urology;  Laterality: Right; . cystoscopy with stent exchange    06-26-17 Dr Alinda Money . CYSTOSCOPY WITH STENT PLACEMENT Right 03/16/2017  Procedure: CYSTOSCOPY WITH STENT PLACEMENT;  Surgeon: Raynelle Bring, MD;  Location: [facility];  Service: Urology;  Laterality: Right; . CYSTOSCOPY WITH STENT PLACEMENT Right 11/23/2017  Procedure: CYSTOSCOPY REMOVAL AND  STENT PLACEMENT;  Surgeon: Raynelle Bring, MD;  Location: [facility];  Service: Urology;  Laterality: Right; . INTRAMEDULLARY (IM) NAIL INTERTROCHANTERIC Left 07/01/2016  Procedure: INTRAMEDULLARY (IM) NAIL INTERTROCHANTERIC;  Surgeon: Tania Ade, MD;  Location: [place];  Service: Orthopedics;  Laterality: Left; . left total knee Left 2011 . PROSTATE BIOPSY N/A 03/22/2016  Procedure: BIOPSY TRANSRECTAL ULTRASONIC PROSTATE (TUBP);  Surgeon: Raynelle Bring, MD;  [facility];  Service: Urology;  Laterality: N/A; . TRANSURETHRAL RESECTION OF BLADDER TUMOR N/A 03/22/2016  Procedure: TRANSURETHRAL RESECTION OF BLADDER TUMOR (TURBT);  Surgeon: Raynelle Bring, MD;  [facility];  Service: Urology;  Laterality: N/A; . TRANSURETHRAL RESECTION OF BLADDER TUMOR N/A 10/20/2016  Procedure: TRANSURETHRAL RESECTION OF BLADDER TUMOR (TURBT);  Surgeon: Raynelle Bring, MD;  [facility];  Service: Urology;  Laterality: N/A; . TRANSURETHRAL RESECTION OF BLADDER TUMOR N/A 03/16/2017  Procedure: TRANSURETHRAL RESECTION OF BLADDER TUMOR (TURBT);  Surgeon: Raynelle Bring, MD;  [facility];  Service: Urology;  Laterality: N/A; HPI: 82 yo male admitted to [facility] with pna, pt has h/o PSP (diagnosed 08/2016) and now has right LL pna.  Pt recently has been put on Seroquel = ? if this could contribute to aspiration pna risk.  Swallow ealuation ordered as pt with frequent coughing when consuming thin water.  Pt is  afebrile with WBC 5.4- lungs sounds decreased.   Pt resides with his wife and she is his caregiver.  Pt is wheelchair bound - and wife reports recently feeding him in his hospital bed.   Subjective: Pt seen in radiology for MBS Assessment / Plan / Recommendation CHL IP CLINICAL IMPRESSIONS 01/10/2018 Clinical Impression Pt was given trials of thin liquid via cup and straw, puree, solid, and barium tablet. He presents with mild oropharyngeal dysphagia, characterized orally by poor bolus formation with premature spillage to the vallecular sinus across consistencies. Pharyngeally, pt exhibits a delayed swallow reflex with trigger at the vallecula, due to decreased sensation for primary trigger site. Trace penetration was seen intermittently during the swallow of thin liquids, however, penetrate cleared spontaneously and was not aspirated, despite challenging pt with large consecutive boluses via cup and straw.  This is considered within normal limits for pt age. No post-swallow residue was seen on any consistency. Barium tablet was given with water, and cleared the oropharynx without difficulty or delay. Recommend continuing with regular solids and thin liquids, meds whole one at a time with liquid. Safe swallow precautions were reviewed and provided in written form to be posted at [facility]. SLP will follow briefly for education. SLP Visit Diagnosis Dysphagia, oropharyngeal phase (R13.12) Impact on safety and function Mild  aspiration risk   CHL IP TREATMENT RECOMMENDATION 01/10/2018 Treatment Recommendations Therapy as outlined in treatment plan below   Prognosis 01/10/2018 Prognosis for Safe Diet Advancement Good CHL IP DIET RECOMMENDATION 01/10/2018 SLP Diet Recommendations Regular solids;Thin liquid Liquid Administration via Cup;Straw Medication Administration Whole meds with liquid Compensations Minimize environmental distractions;Slow rate;Small sips/bites Postural Changes Remain semi-upright after after feeds/meals  (Comment);Seated upright at 90 degrees   CHL IP OTHER RECOMMENDATIONS 01/10/2018 Oral Care Recommendations Oral care TID   CHL IP FOLLOW UP RECOMMENDATIONS 01/10/2018 Follow up Recommendations None   CHL IP FREQUENCY AND DURATION 01/10/2018 Speech Therapy Frequency (ACUTE ONLY) min 1 x/week Treatment Duration 1 week      CHL IP ORAL PHASE 01/10/2018 Oral Phase Impaired Oral - Thin Cup/Straw Premature spillage Oral - Puree Premature spillage Oral - Regular Premature spillage  CHL IP PHARYNGEAL PHASE 01/10/2018 Pharyngeal Phase Impaired Pharyngeal- Thin Cup/Straw Delayed swallow initiation-vallecula;Penetration/Aspiration during swallow Pharyngeal Material does not enter airway;Material enters airway, remains ABOVE vocal cords then ejected out Pharyngeal- Puree Delayed swallow initiation-vallecula Pharyngeal- Regular Delayed swallow initiation-vallecula Pharyngeal- Pill Delayed swallow initiation-vallecula  CHL IP CERVICAL ESOPHAGEAL PHASE 01/10/2018 Cervical Esophageal Phase [facility] Celia B. Quentin [facility], CCC-SLP Speech Language Pathologist [phone] Shonna Chock 01/10/2018, 12:45 PM                Medical Consultants:    None.  Anti-Infectives:   augmentin  Subjective:    [name] she relates she feels better than yesterday her breathing is much improved.  Objective:    Vitals:   01/10/18 0516 01/10/18 1454 01/10/18 2106 01/11/18 0534  BP: 96/68 (!) 154/71 139/72 109/67  Pulse: 75 72 77 63  Resp: 18 20 19 18   Temp: 97.8 F (36.6 C) 97.7 F (36.5 C) 98.4 F (36.9 C) 97.7 F (36.5 C)  TempSrc:  Oral Oral Oral  SpO2: 98% 99% 100% 100%  Weight:        Intake/Output Summary (Last 24 hours) at 01/11/2018 0951 Last data filed at 01/11/2018 0600 Gross per 24 hour  Intake 1600.67 ml  Output 1125 ml  Net 475.67 ml   Filed Weights   01/08/18 1018  Weight: 69.9 kg (154 lb)    Exam: General exam: In no acute distress. Respiratory system: Good air movement and clear to  auscultation. Cardiovascular system: S1 & S2 heard, RRR.  Gastrointestinal system: Abdomen is nondistended, soft and nontender.  Central nervous system: Alert and oriented. No focal neurological deficits. Extremities: No pedal edema. Skin: No rashes, lesions or ulcers Psychiatry: Judgement and insight appear normal. Mood & affect appropriate.    Data Reviewed:    Labs: Basic Metabolic Panel: Recent Labs  Lab 01/08/18 1029 01/09/18 0556 01/10/18 0545  NA 145 146* 143  K 3.7 3.8 3.6  CL 117* 116* 114*  CO2 17* 24 23  GLUCOSE 111* 86 99  BUN 31* 29* 26*  CREATININE 1.92* 1.70* 1.50*  CALCIUM 9.3 8.9 8.6*   GFR Estimated Creatinine Clearance: 32.9 mL/min (A) (by C-G formula based on SCr of 1.5 mg/dL (H)). Liver Function Tests: Recent Labs  Lab 01/08/18 1029  AST 14*  ALT <5  ALKPHOS 65  BILITOT 0.5  PROT 7.3  ALBUMIN 3.7   No results for input(s): LIPASE, AMYLASE in the last 168 hours. No results for input(s): AMMONIA in the last 168 hours. Coagulation profile No results for input(s): INR, PROTIME in the last 168 hours.  CBC: Recent Labs  Lab 01/08/18 1029 01/09/18 0556 01/10/18  0545  WBC 6.7 5.4 4.4  HGB 8.9* 7.6* 7.9*  HCT 27.1* 24.1* 24.7*  MCV 90.3 91.6 91.1  PLT 235 208 211   Cardiac Enzymes: Recent Labs  Lab 01/08/18 1029  TROPONINI <0.03   BNP (last 3 results) No results for input(s): PROBNP in the last 8760 hours. CBG: Recent Labs  Lab 01/08/18 1126  GLUCAP 103*   D-Dimer: No results for input(s): DDIMER in the last 72 hours. Hgb A1c: No results for input(s): HGBA1C in the last 72 hours. Lipid Profile: No results for input(s): CHOL, HDL, LDLCALC, TRIG, CHOLHDL, LDLDIRECT in the last 72 hours. Thyroid function studies: No results for input(s): TSH, T4TOTAL, T3FREE, THYROIDAB in the last 72 hours.  Invalid input(s): FREET3 Anemia work up: No results for input(s): VITAMINB12, FOLATE, FERRITIN, TIBC, IRON, RETICCTPCT in the last 72  hours. Sepsis Labs: Recent Labs  Lab 01/08/18 1029 01/08/18 1531 01/09/18 0556 01/10/18 0545  PROCALCITON  --  <0.10  --   --   WBC 6.7  --  5.4 4.4   Microbiology Recent Results (from the past 240 hour(s))  Culture, blood (Routine X 2) w Reflex to ID Panel     Status: None (Preliminary result)   Collection Time: 01/08/18  3:31 PM  Result Value Ref Range Status   Specimen Description   Final    BLOOD RIGHT HAND THUMB Performed at [facility] [address], [place], [place] [address]    Special Requests   Final    BOTTLES DRAWN AEROBIC AND ANAEROBIC Blood Culture adequate volume Performed at [facility] [address] [place], [place] [address]    Culture   Final    NO GROWTH 2 DAYS Performed at [facility] [address] [place], [place] [address]    Report Status PENDING  Incomplete  Culture, blood (Routine X 2) w Reflex to ID Panel     Status: None (Preliminary result)   Collection Time: 01/08/18  3:37 PM  Result Value Ref Range Status   Specimen Description   Final    BLOOD RIGHT HAND Performed at [facility] [address], [place], [place] [address]    Special Requests   Final    BOTTLES DRAWN AEROBIC AND ANAEROBIC Blood Culture adequate volume Performed at [facility] [address] [place], [place] [address]    Culture   Final    NO GROWTH 2 DAYS Performed at [facility] [address] [place], [place] [address]    Report Status PENDING  Incomplete  MRSA PCR Screening     Status: None   Collection Time: 01/09/18  6:25 AM  Result Value Ref Range Status   MRSA by PCR NEGATIVE NEGATIVE Final    Comment:        The GeneXpert MRSA Assay (FDA approved for NASAL specimens only), is one component of a comprehensive MRSA colonization surveillance program. It is not intended to diagnose MRSA infection nor to guide or monitor treatment for MRSA  infections. Performed at [facility], [place] [address] [place], [place] [address]      Medications:   . amoxicillin-clavulanate  400 mg Oral Q12H  . apixaban  5 mg Oral BID  . carbidopa-levodopa  1.5 tablet Oral TID  . citalopram  20 mg Oral Daily  . QUEtiapine  50 mg Oral QHS  . simvastatin  20 mg Oral q1800   Continuous Infusions:  LOS: 3 days   [place] [facility] Pager [phone]  *Please refer to [email or web address], password [email or web address] to get updated schedule on who will round on this patient, as hospitalists switch teams weekly. If 7PM-7AM, please contact night-coverage at [email or web address], password [email or web address] for any overnight needs.  01/11/2018, 9:51 AM

## 2018-01-12 DIAGNOSIS — Z7901 Long term (current) use of anticoagulants: Secondary | ICD-10-CM | POA: Diagnosis not present

## 2018-01-12 DIAGNOSIS — G4700 Insomnia, unspecified: Secondary | ICD-10-CM | POA: Diagnosis not present

## 2018-01-12 DIAGNOSIS — R278 Other lack of coordination: Secondary | ICD-10-CM | POA: Diagnosis not present

## 2018-01-12 DIAGNOSIS — E870 Hyperosmolality and hypernatremia: Secondary | ICD-10-CM | POA: Diagnosis not present

## 2018-01-12 DIAGNOSIS — E785 Hyperlipidemia, unspecified: Secondary | ICD-10-CM | POA: Diagnosis not present

## 2018-01-12 DIAGNOSIS — Z86718 Personal history of other venous thrombosis and embolism: Secondary | ICD-10-CM | POA: Diagnosis not present

## 2018-01-12 DIAGNOSIS — J181 Lobar pneumonia, unspecified organism: Secondary | ICD-10-CM | POA: Diagnosis not present

## 2018-01-12 DIAGNOSIS — Z8551 Personal history of malignant neoplasm of bladder: Secondary | ICD-10-CM | POA: Diagnosis not present

## 2018-01-12 DIAGNOSIS — F0281 Dementia in other diseases classified elsewhere with behavioral disturbance: Secondary | ICD-10-CM | POA: Diagnosis not present

## 2018-01-12 DIAGNOSIS — Z7401 Bed confinement status: Secondary | ICD-10-CM | POA: Diagnosis not present

## 2018-01-12 DIAGNOSIS — M2550 Pain in unspecified joint: Secondary | ICD-10-CM | POA: Diagnosis not present

## 2018-01-12 DIAGNOSIS — R1312 Dysphagia, oropharyngeal phase: Secondary | ICD-10-CM | POA: Diagnosis not present

## 2018-01-12 DIAGNOSIS — N183 Chronic kidney disease, stage 3 (moderate): Secondary | ICD-10-CM | POA: Diagnosis not present

## 2018-01-12 DIAGNOSIS — J690 Pneumonitis due to inhalation of food and vomit: Secondary | ICD-10-CM | POA: Diagnosis not present

## 2018-01-12 DIAGNOSIS — G20 Parkinson's disease: Secondary | ICD-10-CM | POA: Diagnosis not present

## 2018-01-12 DIAGNOSIS — M6281 Muscle weakness (generalized): Secondary | ICD-10-CM | POA: Diagnosis not present

## 2018-01-12 DIAGNOSIS — R488 Other symbolic dysfunctions: Secondary | ICD-10-CM | POA: Diagnosis not present

## 2018-01-12 DIAGNOSIS — D649 Anemia, unspecified: Secondary | ICD-10-CM | POA: Diagnosis not present

## 2018-01-12 DIAGNOSIS — F329 Major depressive disorder, single episode, unspecified: Secondary | ICD-10-CM | POA: Diagnosis not present

## 2018-01-12 DIAGNOSIS — R2689 Other abnormalities of gait and mobility: Secondary | ICD-10-CM | POA: Diagnosis not present

## 2018-01-12 DIAGNOSIS — D631 Anemia in chronic kidney disease: Secondary | ICD-10-CM | POA: Diagnosis not present

## 2018-01-12 DIAGNOSIS — I10 Essential (primary) hypertension: Secondary | ICD-10-CM | POA: Diagnosis not present

## 2018-01-12 DIAGNOSIS — I82409 Acute embolism and thrombosis of unspecified deep veins of unspecified lower extremity: Secondary | ICD-10-CM | POA: Diagnosis not present

## 2018-01-12 DIAGNOSIS — C679 Malignant neoplasm of bladder, unspecified: Secondary | ICD-10-CM | POA: Diagnosis not present

## 2018-01-12 DIAGNOSIS — N179 Acute kidney failure, unspecified: Secondary | ICD-10-CM | POA: Diagnosis not present

## 2018-01-12 DIAGNOSIS — I129 Hypertensive chronic kidney disease with stage 1 through stage 4 chronic kidney disease, or unspecified chronic kidney disease: Secondary | ICD-10-CM | POA: Diagnosis not present

## 2018-01-12 MED ORDER — AMOXICILLIN-POT CLAVULANATE 400-57 MG/5ML PO SUSR: 400.0000 mg | Freq: Two times a day (BID) | ORAL | 0 refills | Status: AC

## 2018-01-12 NOTE — Progress Notes (Signed)
Attempted to give report times 3 to [place].  Left number with secretary, and on discharge packet for report.

## 2018-01-12 NOTE — Progress Notes (Signed)
Report given to [place] at [facility].

## 2018-01-12 NOTE — Discharge Summary (Signed)
Physician Discharge Summary  Brian Montgomery FAO:[id number] DOB: 09/25/1935 DOA: 01/08/2018  PCP: Denita Lung, MD  Admit date: 01/08/2018 Discharge date: 01/12/2018  Admitted From: Home Disposition:  SNF  Recommendations for Outpatient Follow-up:  1. Follow up with PCP in 1-2 weeks   Home Health:No Equipment/Devices:none  Discharge Condition:stable CODE STATUS:full Diet recommendation: Heart Healthy  Brief/Interim Summary: 82 y.o. male past medical history significant for essential hypertension, chronic kidney disease stage III, bladder and kidney cancer, DVT on Xarelto and dementia completely dependent for daily activities comes into with confusion, likely due to aspiration pneumonia    Discharge Diagnoses:  Active Problems:   Essential hypertension, benign   CKD (chronic kidney disease) stage 3, GFR 30-59 ml/min (HCC)   Dementia   Aspiration pneumonia of right lower lobe (HCC)   History of DVT (deep vein thrombosis)   Hypernatremia   Anemia due to chronic kidney disease   AKI (acute kidney injury) [facility])   Advance care planning   Goals of care, counseling/discussion   Palliative care by specialist   Aspiration pneumonia of the right lower lobe: On chest x-ray showed a right lower lobe infiltrates, he was started on IV empiric antibiotic speech was consulted and recommended an BBS that showed no signs of aspiration, his antibiotic regimen was changed to Augmentin which she will continue for 7 days total.  Next line physical therapy evaluated the patient the recommended skilled nursing facility.  Normocytic anemia his hemoglobin remained at baseline.  Acute kidney injury on chronic kidney disease stage III: Likely prerenal in etiology, lisinopril and hydrochlorothiazide were held he was started on IV fluids is resolved.  Hypovolemic hypernatremia: Resolved with IV fluid hydration.  Essential hypertension: No changes made to his medication.  History of DVT: Continue  Eliquis no changes were made.  History of bladder cancer status post radiation therapy: We will follow-up with urology as an outpatient as needed.  Advanced dementia: No changes were made to his medication. Discharge Instructions  Discharge Instructions    Diet - low sodium heart healthy   Complete by:  As directed    Increase activity slowly   Complete by:  As directed      Allergies as of 01/12/2018   No Known Allergies     Medication List    TAKE these medications   acetaminophen 500 MG tablet Commonly known as:  TYLENOL Take 500 mg by mouth every 6 (six) hours as needed for moderate pain or headache.   amoxicillin-clavulanate 400-57 MG/5ML suspension Commonly known as:  AUGMENTIN Take 5 mLs (400 mg total) by mouth every 12 (twelve) hours for 3 days.   apixaban 5 MG Tabs tablet Commonly known as:  ELIQUIS Take 1 tablet (5 mg total) by mouth 2 (two) times daily.   carbidopa-levodopa 25-100 MG tablet Commonly known as:  SINEMET IR Take 1.5 tablets by mouth 3 (three) times daily.   citalopram 20 MG tablet Commonly known as:  CELEXA TAKE 1 TABLET(20 MG) BY MOUTH DAILY   donepezil 10 MG tablet Commonly known as:  ARICEPT TAKE 1 TABLET(10 MG) BY MOUTH AT BEDTIME   DRY EYES OP Place 1 drop into both eyes as needed (for dry eyes).   lisinopril-hydrochlorothiazide 10-12.5 MG tablet Commonly known as:  PRINZIDE,ZESTORETIC Take 1 tablet by mouth daily.   Melatonin 3 MG Caps Take 3 mg by mouth every other day as needed (for sleep.).   pseudoephedrine-guaifenesin 60-600 MG 12 hr tablet Commonly known as:  MUCINEX D Take 1  tablet by mouth at bedtime as needed for congestion.   QUEtiapine 50 MG tablet Commonly known as:  SEROQUEL TAKE 1 TABLET BY MOUTH AT BEDTIME   simvastatin 20 MG tablet Commonly known as:  ZOCOR TAKE 1 TABLET BY MOUTH DAILY       No Known Allergies  Consultations:  None   Procedures/Studies: Dg Chest 2 View  Result Date:  01/08/2018 CLINICAL DATA:  Worsening altered mental status and lethargy. History of Parkinson's disease EXAM: CHEST - 2 VIEW COMPARISON:  Chest x-ray of June 30, 2016 FINDINGS: The lungs are less well inflated. The interstitial markings are increased especially in the right mid and lower lung. The heart is normal in size. The pulmonary vascularity is not clearly engorged. The trachea is midline. The bony thorax exhibits no acute abnormality. IMPRESSION: Increased density predominantly in the right lower lobe worrisome for pneumonia. No overt CHF. Followup PA and lateral chest X-ray is recommended in 3-4 weeks following trial of antibiotic therapy to ensure resolution and exclude underlying malignancy. Electronically Signed   By: David  Martinique M.D.   On: 01/08/2018 11:11   Ct Head Wo Contrast  Result Date: 01/08/2018 CLINICAL DATA:  82 year old male with altered mental status. Parkinson's disease. Dementia. Hypertension. Initial encounter. EXAM: CT HEAD WITHOUT CONTRAST TECHNIQUE: Contiguous axial images were obtained from the base of the skull through the vertex without intravenous contrast. COMPARISON:  09/21/2017 head CT. FINDINGS: Brain: No intracranial hemorrhage or CT evidence of large acute infarct. Remote left caudate head infarct. Prominent chronic microvascular changes. Moderate global atrophy. No intracranial mass lesion noted on this unenhanced exam. Vascular: Vascular calcifications. Skull: No acute abnormality. Sinuses/Orbits: No acute orbital abnormality. Mild mucosal thickening right maxillary sinus and minimal mucosal thickening left maxillary sinus. Other: Mastoid air cells and middle ear cavities are clear. IMPRESSION: No acute intracranial abnormality. Remote left caudate head infarct. Prominent chronic microvascular changes. Moderate global atrophy. Mild mucosal thickening right maxillary sinus and minimal mucosal thickening left maxillary sinus. Electronically Signed   By: Genia Del  M.D.   On: 01/08/2018 11:21   Dg Swallowing Func-speech Pathology  Result Date: 01/10/2018 Objective Swallowing Evaluation: Type of Study: MBS-Modified Barium Swallow Study  Patient Details Name: Brian Montgomery MRN: [id number] Date of Birth: 08-25-1935 Today's Date: 01/10/2018 Time: SLP Start Time (ACUTE ONLY): 1200 -SLP Stop Time (ACUTE ONLY): 1220 SLP Time Calculation (min) (ACUTE ONLY): 20 min Past Medical History: Past Medical History: Diagnosis Date . Arthritis  . Bladder cancer ([place])   infiltrative high grade papillary urothelial carcinoma  . Dementia   mild  . Dyslipidemia  . ED (erectile dysfunction)  . Hypertension  . 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 Parkinson disease ([place])  . Pneumonia   x1 in adulthood  . Prostate cancer [facility])  Past Surgical History: Past Surgical History: Procedure Laterality Date . APPENDECTOMY   . BACK SURGERY    lumbar ' years ago  . COLONOSCOPY  2007  Dr.kaplan . CYSTOSCOPY N/A 03/22/2016  Procedure: CYSTOSCOPY;  Surgeon: Raynelle Bring, MD;  Location: [facility];  Service: Urology;  Laterality: N/A; . CYSTOSCOPY W/ RETROGRADES N/A 01/21/2016  Procedure: CYSTOSCOPY WITH TURBT WITH  CLOT EVACUATION;  Surgeon: Raynelle Bring, MD;  [facility];  Service: Urology;  Laterality: N/A; . CYSTOSCOPY W/ RETROGRADES N/A 10/20/2016  Procedure: CYSTOSCOPY WITH BILATERAL RETROGRADE PYELOGRAM;  Surgeon: Raynelle Bring, MD;  [facility];  Service: Urology;  Laterality: N/A; . CYSTOSCOPY W/ URETERAL STENT PLACEMENT Right  06/26/2017  Procedure: CYSTOSCOPY WITH STENT CHANGE;  Surgeon: Raynelle Bring, MD;  [facility];  Service: Urology;  Laterality: Right; . cystoscopy with stent exchange    06-26-17 Dr Alinda Money . CYSTOSCOPY WITH STENT PLACEMENT Right 03/16/2017  Procedure: CYSTOSCOPY WITH STENT PLACEMENT;  Surgeon: Raynelle Bring, MD;  [facility];  Service: Urology;  Laterality: Right; . CYSTOSCOPY WITH STENT PLACEMENT Right 11/23/2017  Procedure: CYSTOSCOPY REMOVAL AND  STENT  PLACEMENT;  Surgeon: Raynelle Bring, MD;  [facility];  Service: Urology;  Laterality: Right; . INTRAMEDULLARY (IM) NAIL INTERTROCHANTERIC Left 07/01/2016  Procedure: INTRAMEDULLARY (IM) NAIL INTERTROCHANTERIC;  Surgeon: Tania Ade, MD;  Location: [facility];  Service: Orthopedics;  Laterality: Left; . left total knee Left 2011 . PROSTATE BIOPSY N/A 03/22/2016  Procedure: BIOPSY TRANSRECTAL ULTRASONIC PROSTATE (TUBP);  Surgeon: Raynelle Bring, MD;  [facility];  Service: Urology;  Laterality: N/A; . TRANSURETHRAL RESECTION OF BLADDER TUMOR N/A 03/22/2016  Procedure: TRANSURETHRAL RESECTION OF BLADDER TUMOR (TURBT);  Surgeon: Raynelle Bring, MD;  [facility];  Service: Urology;  Laterality: N/A; . TRANSURETHRAL RESECTION OF BLADDER TUMOR N/A 10/20/2016  Procedure: TRANSURETHRAL RESECTION OF BLADDER TUMOR (TURBT);  Surgeon: Raynelle Bring, MD;  [facility];  Service: Urology;  Laterality: N/A; . TRANSURETHRAL RESECTION OF BLADDER TUMOR N/A 03/16/2017  Procedure: TRANSURETHRAL RESECTION OF BLADDER TUMOR (TURBT);  Surgeon: Raynelle Bring, MD;  [facility];  Service: Urology;  Laterality: N/A; HPI: 82 yo male admitted to [facility] with pna, pt has h/o PSP (diagnosed 08/2016) and now has right LL pna.  Pt recently has been put on Seroquel = ? if this could contribute to aspiration pna risk.  Swallow ealuation ordered as pt with frequent coughing when consuming thin water.  Pt is afebrile with WBC 5.4- lungs sounds decreased.   Pt resides with his wife and she is his caregiver.  Pt is wheelchair bound - and wife reports recently feeding him in his hospital bed.   Subjective: Pt seen in radiology for MBS Assessment / Plan / Recommendation CHL IP CLINICAL IMPRESSIONS 01/10/2018 Clinical Impression Pt was given trials of thin liquid via cup and straw, puree, solid, and barium tablet. He presents with mild oropharyngeal dysphagia, characterized orally by poor bolus formation with premature spillage to the vallecular  sinus across consistencies. Pharyngeally, pt exhibits a delayed swallow reflex with trigger at the vallecula, due to decreased sensation for primary trigger site. Trace penetration was seen intermittently during the swallow of thin liquids, however, penetrate cleared spontaneously and was not aspirated, despite challenging pt with large consecutive boluses via cup and straw.  This is considered within normal limits for pt age. No post-swallow residue was seen on any consistency. Barium tablet was given with water, and cleared the oropharynx without difficulty or delay. Recommend continuing with regular solids and thin liquids, meds whole one at a time with liquid. Safe swallow precautions were reviewed and provided in written form to be posted at [facility]. SLP will follow briefly for education. SLP Visit Diagnosis Dysphagia, oropharyngeal phase (R13.12) Impact on safety and function Mild aspiration risk   CHL IP TREATMENT RECOMMENDATION 01/10/2018 Treatment Recommendations Therapy as outlined in treatment plan below   Prognosis 01/10/2018 Prognosis for Safe Diet Advancement Good CHL IP DIET RECOMMENDATION 01/10/2018 SLP Diet Recommendations Regular solids;Thin liquid Liquid Administration via Cup;Straw Medication Administration Whole meds with liquid Compensations Minimize environmental distractions;Slow rate;Small sips/bites Postural Changes Remain semi-upright after after feeds/meals (Comment);Seated upright at 90 degrees   CHL IP OTHER RECOMMENDATIONS  01/10/2018 Oral Care Recommendations Oral care TID   CHL IP FOLLOW UP RECOMMENDATIONS 01/10/2018 Follow up Recommendations None   CHL IP FREQUENCY AND DURATION 01/10/2018 Speech Therapy Frequency (ACUTE ONLY) min 1 x/week Treatment Duration 1 week      CHL IP ORAL PHASE 01/10/2018 Oral Phase Impaired Oral - Thin Cup/Straw Premature spillage Oral - Puree Premature spillage Oral - Regular Premature spillage  CHL IP PHARYNGEAL PHASE 01/10/2018 Pharyngeal Phase Impaired Pharyngeal- Thin  Cup/Straw Delayed swallow initiation-vallecula;Penetration/Aspiration during swallow Pharyngeal Material does not enter airway;Material enters airway, remains ABOVE vocal cords then ejected out Pharyngeal- Puree Delayed swallow initiation-vallecula Pharyngeal- Regular Delayed swallow initiation-vallecula Pharyngeal- Pill Delayed swallow initiation-vallecula  CHL IP CERVICAL ESOPHAGEAL PHASE 01/10/2018 Cervical Esophageal Phase [facility] Celia B. Quentin Ore, [facility], CCC-SLP Speech Language Pathologist [phone] Shonna Chock 01/10/2018, 12:45 PM                 Subjective: No complains  Discharge Exam: Vitals:   01/11/18 2137 01/12/18 0454  BP: 139/83 118/67  Pulse: 81 78  Resp: 18 18  Temp: 97.9 F (36.6 C) 97.9 F (36.6 C)  SpO2: 99% 100%   Vitals:   01/11/18 0534 01/11/18 1350 01/11/18 2137 01/12/18 0454  BP: 109/67 119/71 139/83 118/67  Pulse: 63 73 81 78  Resp: 18 18 18 18   Temp: 97.7 F (36.5 C) 98.5 F (36.9 C) 97.9 F (36.6 C) 97.9 F (36.6 C)  TempSrc: Oral Oral Oral Oral  SpO2: 100% 100% 99% 100%  Weight:        General: Pt is alert, awake, not in acute distress Cardiovascular: RRR, S1/S2 +, no rubs, no gallops Respiratory: CTA bilaterally, no wheezing, no rhonchi Abdominal: Soft, NT, ND, bowel sounds + Extremities: no edema, no cyanosis    The results of significant diagnostics from this hospitalization (including imaging, microbiology, ancillary and laboratory) are listed below for reference.     Microbiology: Recent Results (from the past 240 hour(s))  Culture, blood (Routine X 2) w Reflex to ID Panel     Status: None (Preliminary result)   Collection Time: 01/08/18  3:31 PM  Result Value Ref Range Status   Specimen Description   Final    BLOOD RIGHT HAND THUMB Performed at [facility] [address] [place], [place] [address]    Special Requests   Final    BOTTLES DRAWN AEROBIC AND ANAEROBIC Blood Culture adequate volume Performed at  [facility] [address], [place], [place] [address]    Culture   Final    NO GROWTH 3 DAYS Performed at [facility], [place] [address], [place], [place] [address]    Report Status PENDING  Incomplete  Culture, blood (Routine X 2) w Reflex to ID Panel     Status: None (Preliminary result)   Collection Time: 01/08/18  3:37 PM  Result Value Ref Range Status   Specimen Description   Final    BLOOD RIGHT HAND Performed at [facility] [address], [place], [place] [address]    Special Requests   Final    BOTTLES DRAWN AEROBIC AND ANAEROBIC Blood Culture adequate volume Performed at [facility] [address] [place], [place] [address]    Culture   Final    NO GROWTH 3 DAYS Performed at [facility], [place] [address], [place], [place] [address]    Report Status PENDING  Incomplete  MRSA PCR Screening  Status: None   Collection Time: 01/09/18  6:25 AM  Result Value Ref Range Status   MRSA by PCR NEGATIVE NEGATIVE Final    Comment:        The GeneXpert MRSA Assay (FDA approved for NASAL specimens only), is one component of a comprehensive MRSA colonization surveillance program. It is not intended to diagnose MRSA infection nor to guide or monitor treatment for MRSA infections. Performed at [facility], [place] [address] [place], [place] [address]      Labs: BNP (last 3 results) Recent Labs    01/08/18 1029  BNP 99.2   Basic Metabolic Panel: Recent Labs  Lab 01/08/18 1029 01/09/18 0556 01/10/18 0545  NA 145 146* 143  K 3.7 3.8 3.6  CL 117* 116* 114*  CO2 17* 24 23  GLUCOSE 111* 86 99  BUN 31* 29* 26*  CREATININE 1.92* 1.70* 1.50*  CALCIUM 9.3 8.9 8.6*   Liver Function Tests: Recent Labs  Lab 01/08/18 1029  AST 14*  ALT <5  ALKPHOS 65  BILITOT 0.5  PROT 7.3  ALBUMIN 3.7   No results for input(s): LIPASE, AMYLASE in the last 168 hours. No results for  input(s): AMMONIA in the last 168 hours. CBC: Recent Labs  Lab 01/08/18 1029 01/09/18 0556 01/10/18 0545  WBC 6.7 5.4 4.4  HGB 8.9* 7.6* 7.9*  HCT 27.1* 24.1* 24.7*  MCV 90.3 91.6 91.1  PLT 235 208 211   Cardiac Enzymes: Recent Labs  Lab 01/08/18 1029  TROPONINI <0.03   BNP: Invalid input(s): POCBNP CBG: Recent Labs  Lab 01/08/18 1126  GLUCAP 103*   D-Dimer No results for input(s): DDIMER in the last 72 hours. Hgb A1c No results for input(s): HGBA1C in the last 72 hours. Lipid Profile No results for input(s): CHOL, HDL, LDLCALC, TRIG, CHOLHDL, LDLDIRECT in the last 72 hours. Thyroid function studies No results for input(s): TSH, T4TOTAL, T3FREE, THYROIDAB in the last 72 hours.  Invalid input(s): FREET3 Anemia work up No results for input(s): VITAMINB12, FOLATE, FERRITIN, TIBC, IRON, RETICCTPCT in the last 72 hours. Urinalysis    Component Value Date/Time   COLORURINE RED (A) 01/08/2018 1217   APPEARANCEUR TURBID (A) 01/08/2018 1217   LABSPEC 1.015 01/08/2018 1217   PHURINE 5.0 01/08/2018 1217   GLUCOSEU NEGATIVE 01/08/2018 1217   HGBUR LARGE (A) 01/08/2018 1217   BILIRUBINUR NEGATIVE 01/08/2018 1217   BILIRUBINUR n 12/02/2015 1535   KETONESUR NEGATIVE 01/08/2018 1217   PROTEINUR 100 (A) 01/08/2018 1217   UROBILINOGEN negative 12/02/2015 1535   UROBILINOGEN 0.2 02/17/2010 1259   NITRITE NEGATIVE 01/08/2018 1217   LEUKOCYTESUR NEGATIVE 01/08/2018 1217   Sepsis Labs Invalid input(s): PROCALCITONIN,  WBC,  LACTICIDVEN Microbiology Recent Results (from the past 240 hour(s))  Culture, blood (Routine X 2) w Reflex to ID Panel     Status: None (Preliminary result)   Collection Time: 01/08/18  3:31 PM  Result Value Ref Range Status   Specimen Description   Final    BLOOD RIGHT HAND THUMB Performed at [facility], [place] [address] [place], [place] [address]    Special Requests   Final    BOTTLES DRAWN AEROBIC AND ANAEROBIC Blood Culture  adequate volume Performed at [facility] [address] [place], [place] [address]    Culture   Final    NO GROWTH 3 DAYS Performed at [facility], [place] [address], [place],  [address]    Report Status PENDING  Incomplete  Culture, blood (Routine X 2) w Reflex to ID Panel     Status: None (Preliminary result)   Collection Time: 01/08/18  3:37 PM  Result Value Ref Range Status   Specimen Description   Final    BLOOD RIGHT HAND Performed at [facility] [address], [place], [place] [address]    Special Requests   Final    BOTTLES DRAWN AEROBIC AND ANAEROBIC Blood Culture adequate volume Performed at [facility] [address], [place], [place] [address]    Culture   Final    NO GROWTH 3 DAYS Performed at [facility], [place] [address], [place], [place] [address]    Report Status PENDING  Incomplete  MRSA PCR Screening     Status: None   Collection Time: 01/09/18  6:25 AM  Result Value Ref Range Status   MRSA by PCR NEGATIVE NEGATIVE Final    Comment:        The GeneXpert MRSA Assay (FDA approved for NASAL specimens only), is one component of a comprehensive MRSA colonization surveillance program. It is not intended to diagnose MRSA infection nor to guide or monitor treatment for MRSA infections. Performed at [facility], [place] [address] [place], [place] [address]      Time coordinating discharge: 35 minutes  SIGNED:   Charlynne Cousins, MD  [facility] 01/12/2018, 8:29 AM Pager   If 7PM-7AM, please contact night-coverage [email or web address] Password [email or web address]

## 2018-01-12 NOTE — Clinical Social Work Placement (Signed)
   10:30 AM  Patient and family chose bed at [facility].  LCSW confirmed bed with facility.   LCSW faxed dc docs to facility.   LCSW notified family of transfer.   Patient will transport by [facility].  RN report #: [phone]  BK[facility]  CLINICAL SOCIAL WORK PLACEMENT  NOTE  Date:  01/12/2018  Patient Details  Name: Brian Montgomery MRN: [id number] Date of Birth: 14-Oct-1935  Clinical Social Work is seeking post-discharge placement for this patient at the [place] level of care (*CSW will initial, date and re-position this form in  chart as items are completed):  Yes   Patient/family provided with [facility] Work Department's list of facilities offering this level of care within the geographic area requested by the patient (or if unable, by the patient's family).  Yes   Patient/family informed of their freedom to choose among providers that offer the needed level of care, that participate in Medicare, Medicaid or managed care program needed by the patient, have an available bed and are willing to accept the patient.  Yes   Patient/family informed of [facility]'s ownership interest in [facility] and [facility], as well as of the fact that they are under no obligation to receive care at these facilities.  PASRR submitted to EDS on       PASRR number received on 01/12/18     Existing PASRR number confirmed on       FL2 transmitted to all facilities in geographic area requested by pt/family on       FL2 transmitted to all facilities within larger geographic area on       Patient informed that his/her managed care company has contracts with or will negotiate with certain facilities, including the following:        Yes   Patient/family informed of bed offers received.  Patient chooses bed at [facility] recommends and patient chooses bed at      Patient to be transferred to [facility] on  01/12/18.  Patient to be transferred to facility by EMS     Patient family notified on 01/12/18 of transfer.  Name of family member notified:  [name], Spouse     PHYSICIAN Please sign DNR     Additional Comment:    _______________________________________________ Servando Snare, LCSW 01/12/2018, 10:30 AM

## 2018-01-12 NOTE — Clinical Social Work Note (Signed)
Clinical Social Work Assessment  Patient Details  Name: Brian Montgomery MRN: [id number] Date of Birth: 10-04-1935  Date of referral:  01/12/18               Reason for consult:  Facility Placement                Permission sought to share information with:  Case Manager, Customer service manager, Family Supports Permission granted to share information::  No(Patietn not oriented)  Name::     Radio broadcast assistant::  SNF  Relationship::  Spouse  Contact Information:     Housing/Transportation Living arrangements for the past 2 months:  Single Family Home Source of Information:  Spouse Patient Interpreter Needed:  None Criminal Activity/Legal Involvement Pertinent to Current Situation/Hospitalization:  No - Comment as needed Significant Relationships:  Adult Children, Other Family Members, Spouse Lives with:  Spouse Do you feel safe going back to the place where you live?    Need for family participation in patient care:  Yes (Comment)  Care giving concerns:  No care giving concerns at the time of assessment.    Social Worker assessment / plan: LCSW consulted for SNF placement.    Patient admitted for Aspiration pneumonia.  LCSW met with patient at bedside. Patietn is not oriented and unable to participate in assessment. Patient's spouse, Brian Montgomery and grandson are present.   Patient spouse reports that patient is in a wheelchair at [place] and requires assistance with transfers. Patient's spouse reports that she is the patient's primary caregiver. According to spouse patient requires assistance with all ADLs.    Spouse reports patient was previously at [facility] and had a fall and broke his hip. Family is agreeable to SNF but prefers not to return to the same facility. Family would prefer a facility close to their home in [place].   PLAN: SNF at DC.    Employment status:  Retired Forensic scientist:  Medicare PT Recommendations:  [place] / Referral to  community resources:     Patient/Family's Response to care:  Patient's family are pleasant and thankful for LCSW visit and assistance with DC planning.   Patient/Family's Understanding of and Emotional Response to Diagnosis, Current Treatment, and Prognosis:  Patient's spouse expressed she would like for patient to be somewhere where he is comfortable. Spouse asked LCSW questions about long term care and if he would be required to stay in the same facility if she chooses LTC in the future. LCSW answered spouses questions.   Emotional Assessment Appearance:  Appears stated age Attitude/Demeanor/Rapport:  Unable to Assess Affect (typically observed):  Calm Orientation:  Oriented to Self Alcohol / Substance use:  Not Applicable Psych involvement (Current and /or in the community):  No (Comment)  Discharge Needs  Concerns to be addressed:  No discharge needs identified Readmission within the last 30 days:  No Current discharge risk:  None Barriers to Discharge:  No Barriers Identified   Servando Snare, LCSW 01/12/2018, 10:20 AM

## 2018-01-12 NOTE — NC FL2 (Signed)
[place] LEVEL OF CARE SCREENING TOOL     IDENTIFICATION  Patient Name: Brian Montgomery Birthdate: [birth year] Sex: male Admission Date (Current Location): 01/08/2018  [facility] and [place] Number:  Herbalist and Address:  [facility],  [address] [place], [place]      Provider Number: [id number]  Attending Physician Name and Address:  Charlynne Cousins, MD  Relative Name and Phone Number:       Current Level of Care: Hospital Recommended Level of Care: [place] Prior Approval Number:    Date Approved/Denied: 01/12/18 PASRR Number:  [id number]  Discharge Plan: SNF    Current Diagnoses: Patient Active Problem List   Diagnosis Date Noted  . Advance care planning   . Goals of care, counseling/discussion   . Palliative care by specialist   . History of DVT (deep vein thrombosis) 01/10/2018  . Hypernatremia 01/10/2018  . Anemia due to chronic kidney disease 01/10/2018  . AKI (acute kidney injury) ([place]) 01/10/2018  . Aspiration pneumonia of right lower lobe ([place]) 01/08/2018  . Ureteral obstruction, right 06/26/2017  . PSP (progressive supranuclear palsy) ([place]) 08/25/2016  . Dementia 07/03/2016  . CKD (chronic kidney disease) stage 3, GFR 30-59 ml/min (HCC) 07/01/2016  . Hip fracture ([place]) 06/30/2016  . Weakness generalized 06/13/2016  . Malignant neoplasm of prostate ([facility]) 03/18/2016  . Bladder cancer ([place]) 01/21/2016  . Appendicular ataxia 09/24/2015  . Pruritus 09/24/2015  . Hereditary and idiopathic peripheral neuropathy 01/14/2015  . Gait instability 10/13/2014  . Hyperlipidemia with target LDL less than 100 08/06/2013  . Essential hypertension, benign 09/17/2012    Orientation RESPIRATION BLADDER Height & Weight     Self  Normal Incontinent, External catheter Weight: 154 lb (69.9 kg) Height:     BEHAVIORAL SYMPTOMS/MOOD NEUROLOGICAL BOWEL NUTRITION STATUS      Continent Diet(Heart Healthy)   AMBULATORY STATUS COMMUNICATION OF NEEDS Skin   Extensive Assist Verbally Normal                       Personal Care Assistance Level of Assistance  Bathing, Feeding, Dressing Bathing Assistance: Limited assistance Feeding assistance: Limited assistance Dressing Assistance: Limited assistance     Functional Limitations Info  Sight, Hearing, Speech Sight Info: Adequate Hearing Info: Adequate Speech Info: Adequate    SPECIAL CARE FACTORS FREQUENCY  PT (By licensed PT), OT (By licensed OT)     PT Frequency: 5x/week OT Frequency: 5x/week            Contractures Contractures Info: Not present    Additional Factors Info  Code Status, Allergies Code Status Info: Full Allergies Info: NKA           Current Medications (01/12/2018):  This is the current hospital active medication list Current Facility-Administered Medications  Medication Dose Route Frequency Provider Last Rate Last Dose  . acetaminophen (TYLENOL) tablet 650 mg  650 mg Oral Q6H PRN Patrecia Pour, Christean Grief, MD       Or  . acetaminophen (TYLENOL) suppository 650 mg  650 mg Rectal Q6H PRN Patrecia Pour, Christean Grief, MD      . amoxicillin-clavulanate (AUGMENTIN) 400-57 MG/5ML suspension 400 mg  400 mg Oral Q12H Charlynne Cousins, MD   400 mg at 01/12/18 [phone]  . apixaban (ELIQUIS) tablet 5 mg  5 mg Oral BID Patrecia Pour, Christean Grief, MD   5 mg at 01/12/18 [phone]  . carbidopa-levodopa (SINEMET IR) 25-100 MG per tablet immediate release 1.5 tablet  1.5 tablet Oral TID Patrecia Pour, Christean Grief, MD   1.5 tablet at 01/12/18 [phone]  . citalopram (CELEXA) tablet 20 mg  20 mg Oral Daily Charlynne Cousins, MD   20 mg at 01/12/18 [phone]  . guaiFENesin-dextromethorphan (ROBITUSSIN DM) 100-10 MG/5ML syrup 5 mL  5 mL Oral Q4H PRN Patrecia Pour, Christean Grief, MD      . ondansetron [facility]) tablet 4 mg  4 mg Oral Q6H PRN Patrecia Pour, Christean Grief, MD       Or  . ondansetron [facility]) injection 4 mg  4 mg Intravenous Q6H PRN Patrecia Pour, Christean Grief, MD      .  QUEtiapine (SEROQUEL) tablet 50 mg  50 mg Oral QHS Patrecia Pour, Christean Grief, MD   50 mg at 01/11/18 2252  . senna-docusate (Senokot-S) tablet 1 tablet  1 tablet Oral QHS PRN Patrecia Pour, Christean Grief, MD      . simvastatin (ZOCOR) tablet 20 mg  20 mg Oral q1800 Patrecia Pour, Christean Grief, MD   20 mg at 01/11/18 1739     Discharge Medications: Please see discharge summary for a list of discharge medications.  Relevant Imaging Results:  Relevant Lab Results:   Additional Information SSN: [id number]  Servando Snare, LCSW

## 2018-01-13 LAB — CULTURE, BLOOD (ROUTINE X 2)
Culture: NO GROWTH
Culture: NO GROWTH
Special Requests: ADEQUATE
Special Requests: ADEQUATE

## 2018-01-15 ENCOUNTER — Encounter: Payer: Self-pay | Admitting: Adult Health

## 2018-01-15 ENCOUNTER — Non-Acute Institutional Stay (SKILLED_NURSING_FACILITY): Payer: Medicare Other | Admitting: Adult Health

## 2018-01-15 DIAGNOSIS — Z86718 Personal history of other venous thrombosis and embolism: Secondary | ICD-10-CM | POA: Diagnosis not present

## 2018-01-15 DIAGNOSIS — Z8551 Personal history of malignant neoplasm of bladder: Secondary | ICD-10-CM | POA: Diagnosis not present

## 2018-01-15 DIAGNOSIS — F329 Major depressive disorder, single episode, unspecified: Secondary | ICD-10-CM | POA: Diagnosis not present

## 2018-01-15 DIAGNOSIS — J181 Lobar pneumonia, unspecified organism: Secondary | ICD-10-CM

## 2018-01-15 DIAGNOSIS — D631 Anemia in chronic kidney disease: Secondary | ICD-10-CM

## 2018-01-15 DIAGNOSIS — F0281 Dementia in other diseases classified elsewhere with behavioral disturbance: Secondary | ICD-10-CM

## 2018-01-15 DIAGNOSIS — N183 Chronic kidney disease, stage 3 (moderate): Secondary | ICD-10-CM | POA: Diagnosis not present

## 2018-01-15 DIAGNOSIS — I10 Essential (primary) hypertension: Secondary | ICD-10-CM

## 2018-01-15 DIAGNOSIS — G20 Parkinson's disease: Secondary | ICD-10-CM

## 2018-01-15 DIAGNOSIS — F32A Depression, unspecified: Secondary | ICD-10-CM

## 2018-01-15 DIAGNOSIS — F02818 Dementia in other diseases classified elsewhere, unspecified severity, with other behavioral disturbance: Secondary | ICD-10-CM

## 2018-01-15 DIAGNOSIS — J189 Pneumonia, unspecified organism: Secondary | ICD-10-CM

## 2018-01-15 DIAGNOSIS — G20A1 Parkinson's disease without dyskinesia, without mention of fluctuations: Secondary | ICD-10-CM

## 2018-01-15 NOTE — Progress Notes (Signed)
Location:  [place] Room Number: 215-A Place of Service:  SNF (31) Provider:  Durenda Age, NP  Patient Care Team: Denita Lung, MD as PCP - General (Family Medicine) Raynelle Bring, MD as Consulting Physician (Urology)  Extended Emergency Contact Information Primary Emergency Contact: [facility] Address: [address] [place], [place] [address] [name] Phone: [phone] Relation: Spouse Secondary Emergency Contact: [name] Address: [address]          [place], [place] [address] [name] Phone: [phone] Mobile Phone: [phone] Relation: Daughter  Code Status:  DNR  Goals of care: Advanced Directive information Advanced Directives 01/08/2018  Does Patient Have a Medical Advance Directive? Yes  Type of Advance Directive [place]  Does patient want to make changes to medical advance directive? No - Patient declined  Copy of [place] in Chart? Yes  Would patient like information on creating a medical advance directive? -     Chief Complaint  Patient presents with  . Acute Visit    Patient seen for hospital followup, status post admission at [facility] 7/1-01/12/18 for pneumonia    HPI:  Pt is a 82 y.o. male seen today for hospital followup.  He was admitted to [facility] for short-term rehabilitation on 01/12/18 following an admission at [facility] 7/1-01/12/18 for pneumonia. He was brought to the hospital due to confusion and was thought to have aspiration pneumonia. Chest x-ray showed right lower lobe infiltrates and was started on IV empiric antibiotic. BBS recommended by speech therapist showed no signs of aspiration. Antibiotic was changed to oral antibiotic and will continue X 7 days total. He has a PMH of essential hypertension, CKD stage III, bladder and kidney cancer, DVT on Xarelato, and dementia. He was seen in the room  today with son-in-law, niece and nephew. Talked to wife over the phone and verbalized that patient is a DNR. Patient is sleepy but verbally responsive and open his eyes upon verbal inquiry.   Past Medical History:  Diagnosis Date  . Arthritis   . Bladder cancer ([place])    infiltrative high grade papillary urothelial carcinoma   . Dementia    mild   . Dyslipidemia   . ED (erectile dysfunction)   . Hypertension   . Palsy ([place])    super nuclear palsy managed by [name]  . Parkinson disease ([place])   . Pneumonia    x1 in adulthood   . Prostate cancer [facility])    Past Surgical History:  Procedure Laterality Date  . APPENDECTOMY    . BACK SURGERY     lumbar ' years ago   . COLONOSCOPY  2007   Dr.kaplan  . CYSTOSCOPY N/A 03/22/2016   Procedure: CYSTOSCOPY;  Surgeon: Raynelle Bring, MD;  [facility];  Service: Urology;  Laterality: N/A;  . CYSTOSCOPY W/ RETROGRADES N/A 01/21/2016   Procedure: CYSTOSCOPY WITH TURBT WITH  CLOT EVACUATION;  Surgeon: Raynelle Bring, MD;  [facility];  Service: Urology;  Laterality: N/A;  . CYSTOSCOPY W/ RETROGRADES N/A 10/20/2016   Procedure: CYSTOSCOPY WITH BILATERAL RETROGRADE PYELOGRAM;  Surgeon: Raynelle Bring, MD;  [facility];  Service: Urology;  Laterality: N/A;  . CYSTOSCOPY W/ URETERAL STENT PLACEMENT Right 06/26/2017   Procedure: CYSTOSCOPY WITH STENT CHANGE;  Surgeon: Raynelle Bring, MD;  [facility];  Service: Urology;  Laterality: Right;  . cystoscopy with stent exchange  06-26-17 Dr Alinda Money  . CYSTOSCOPY WITH STENT PLACEMENT Right 03/16/2017   Procedure: CYSTOSCOPY WITH STENT PLACEMENT;  Surgeon: Raynelle Bring, MD;  [facility];  Service: Urology;  Laterality: Right;  . CYSTOSCOPY WITH STENT PLACEMENT Right 11/23/2017   Procedure: CYSTOSCOPY REMOVAL AND  STENT PLACEMENT;  Surgeon: Raynelle Bring, MD;  [facility];  Service: Urology;  Laterality: Right;  . INTRAMEDULLARY (IM) NAIL INTERTROCHANTERIC Left 07/01/2016    Procedure: INTRAMEDULLARY (IM) NAIL INTERTROCHANTERIC;  Surgeon: Tania Ade, MD;  Location: [place];  Service: Orthopedics;  Laterality: Left;  . left total knee Left 2011  . PROSTATE BIOPSY N/A 03/22/2016   Procedure: BIOPSY TRANSRECTAL ULTRASONIC PROSTATE (TUBP);  Surgeon: Raynelle Bring, MD;  [facility];  Service: Urology;  Laterality: N/A;  . TRANSURETHRAL RESECTION OF BLADDER TUMOR N/A 03/22/2016   Procedure: TRANSURETHRAL RESECTION OF BLADDER TUMOR (TURBT);  Surgeon: Raynelle Bring, MD;  [facility];  Service: Urology;  Laterality: N/A;  . TRANSURETHRAL RESECTION OF BLADDER TUMOR N/A 10/20/2016   Procedure: TRANSURETHRAL RESECTION OF BLADDER TUMOR (TURBT);  Surgeon: Raynelle Bring, MD;  [facility];  Service: Urology;  Laterality: N/A;  . TRANSURETHRAL RESECTION OF BLADDER TUMOR N/A 03/16/2017   Procedure: TRANSURETHRAL RESECTION OF BLADDER TUMOR (TURBT);  Surgeon: Raynelle Bring, MD;  [facility];  Service: Urology;  Laterality: N/A;    No Known Allergies  Outpatient Encounter Medications as of 01/15/2018  Medication Sig  . acetaminophen (TYLENOL) 500 MG tablet Take 500 mg by mouth every 6 (six) hours as needed for moderate pain or headache.  Marland Kitchen amoxicillin-clavulanate (AUGMENTIN) 400-57 MG/5ML suspension Take 5 mLs (400 mg total) by mouth every 12 (twelve) hours for 3 days.  Marland Kitchen apixaban (ELIQUIS) 5 MG TABS tablet Take 1 tablet (5 mg total) by mouth 2 (two) times daily.  . carbidopa-levodopa (SINEMET IR) 25-100 MG tablet Take 1.5 tablets by mouth 3 (three) times daily.  . citalopram (CELEXA) 20 MG tablet TAKE 1 TABLET(20 MG) BY MOUTH DAILY  . donepezil (ARICEPT) 10 MG tablet TAKE 1 TABLET(10 MG) BY MOUTH AT BEDTIME  . hydroxypropyl methylcellulose / hypromellose (ISOPTO TEARS / GONIOVISC) 2.5 % ophthalmic solution Place 1 drop into both eyes 2 (two) times daily as needed for dry eyes.  Marland Kitchen lisinopril-hydrochlorothiazide (PRINZIDE,ZESTORETIC) 10-12.5 MG tablet Take 1 tablet by  mouth daily.  . Melatonin 3 MG CAPS Take 3 mg by mouth every other day as needed (for sleep.).   Marland Kitchen pseudoephedrine-guaifenesin (MUCINEX D) 60-600 MG 12 hr tablet Take 1 tablet by mouth at bedtime as needed for congestion.   . QUEtiapine (SEROQUEL) 50 MG tablet TAKE 1 TABLET BY MOUTH AT BEDTIME  . simvastatin (ZOCOR) 20 MG tablet TAKE 1 TABLET BY MOUTH DAILY   No facility-administered encounter medications on file as of 01/15/2018.     Review of Systems  GENERAL: No change in appetite, no fatigue, no weight changes, no fever, chills or weakness MOUTH and THROAT: Denies oral discomfort, gingival pain or bleeding RESPIRATORY: no cough, SOB, DOE, wheezing, hemoptysis CARDIAC: No chest pain, edema or palpitations GI: No abdominal pain, diarrhea, constipation, heart burn, nausea or vomiting GU: Denies dysuria, frequency, hematuria, incontinence, or discharge PSYCHIATRIC: Denies feelings of depression or anxiety. No report of hallucinations, insomnia, paranoia, or agitation   Immunization History  Administered Date(s) Administered  . Influenza Split 05/25/2012  . Influenza Whole 08/05/2009  . Influenza, High Dose Seasonal PF 04/29/2013, 06/11/2014, 06/25/2015, 04/11/2017  . Pneumococcal Conjugate-13 08/06/2013  . Pneumococcal Polysaccharide-23 07/01/2008  .  Tdap 07/01/2008  . Zoster 08/05/2009   Pertinent  Health Maintenance Due  Topic Date Due  . INFLUENZA VACCINE  02/08/2018  . PNA vac Low Risk Adult  Completed   Fall Risk  08/24/2017 03/14/2017 08/25/2016 03/17/2016 01/26/2016  Falls in the past year? No No Yes Yes Yes  Number falls in past yr: - - 2 or more 1 2 or more  Injury with Fall? - - Yes Yes Yes  Risk Factor Category  - - High Fall Risk High Fall Risk High Fall Risk  Risk for fall due to : - - - History of fall(s);Impaired mobility;Other (Comment) -  Risk for fall due to: Comment - - - related to effects of palsy -  Follow up - - - Falls prevention discussed Falls evaluation  completed     Vitals:   01/15/18 0726  BP: 125/65  Pulse: 76  Resp: 20  Temp: 98.1 F (36.7 C)  TempSrc: Oral  SpO2: 97%  Weight: 154 lb (69.9 kg)  Height: 5\' 4"  (1.626 m)   Body mass index is 26.43 kg/m.  Physical Exam  GENERAL APPEARANCE: Well nourished. In no acute distress. Normal body habitus SKIN:  Skin is warm and dry.  MOUTH and THROAT: Lips are without lesions. Oral mucosa is moist and without lesions. Tongue is normal in shape, size, and color and without lesions RESPIRATORY: Breathing is even & unlabored, BS CTAB CARDIAC: RRR, no murmur,no extra heart sounds, no edema GI: Abdomen soft, normal BS, no masses, no tenderness EXTREMITIES:  Able to move X 4 extremities NEUROLOGICAL: There is no tremor. Speech is clear PSYCHIATRIC: Alert to self, disoriented to time and place. Affect and behavior are appropriate   Labs reviewed: Recent Labs    01/08/18 1029 01/09/18 0556 01/10/18 0545  NA 145 146* 143  K 3.7 3.8 3.6  CL 117* 116* 114*  CO2 17* 24 23  GLUCOSE 111* 86 99  BUN 31* 29* 26*  CREATININE 1.92* 1.70* 1.50*  CALCIUM 9.3 8.9 8.6*   Recent Labs    01/08/18 1029  AST 14*  ALT <5  ALKPHOS 65  BILITOT 0.5  PROT 7.3  ALBUMIN 3.7   Recent Labs    09/20/17 1358  01/08/18 1029 01/09/18 0556 01/10/18 0545  WBC 6.1   < > 6.7 5.4 4.4  NEUTROABS 4.7  --   --   --   --   HGB 9.3*   < > 8.9* 7.6* 7.9*  HCT 28.9*   < > 27.1* 24.1* 24.7*  MCV 90.9   < > 90.3 91.6 91.1  PLT 169   < > 235 208 211   < > = values in this interval not displayed.   Lab Results  Component Value Date   TSH 0.856 06/13/2016    Lab Results  Component Value Date   CHOL 180 06/25/2015   HDL 81 06/25/2015   LDLCALC 88 06/25/2015   TRIG 56 06/25/2015   CHOLHDL 2.2 06/25/2015    Significant Diagnostic Results in last 30 days:  Dg Chest 2 View  Result Date: 01/08/2018 CLINICAL DATA:  Worsening altered mental status and lethargy. History of Parkinson's disease EXAM:  CHEST - 2 VIEW COMPARISON:  Chest x-ray of June 30, 2016 FINDINGS: The lungs are less well inflated. The interstitial markings are increased especially in the right mid and lower lung. The heart is normal in size. The pulmonary vascularity is not clearly engorged. The trachea is midline. The bony thorax exhibits  no acute abnormality. IMPRESSION: Increased density predominantly in the right lower lobe worrisome for pneumonia. No overt CHF. Followup PA and lateral chest X-ray is recommended in 3-4 weeks following trial of antibiotic therapy to ensure resolution and exclude underlying malignancy. Electronically Signed   By: David  Martinique M.D.   On: 01/08/2018 11:11   Ct Head Wo Contrast  Result Date: 01/08/2018 CLINICAL DATA:  82 year old male with altered mental status. Parkinson's disease. Dementia. Hypertension. Initial encounter. EXAM: CT HEAD WITHOUT CONTRAST TECHNIQUE: Contiguous axial images were obtained from the base of the skull through the vertex without intravenous contrast. COMPARISON:  09/21/2017 head CT. FINDINGS: Brain: No intracranial hemorrhage or CT evidence of large acute infarct. Remote left caudate head infarct. Prominent chronic microvascular changes. Moderate global atrophy. No intracranial mass lesion noted on this unenhanced exam. Vascular: Vascular calcifications. Skull: No acute abnormality. Sinuses/Orbits: No acute orbital abnormality. Mild mucosal thickening right maxillary sinus and minimal mucosal thickening left maxillary sinus. Other: Mastoid air cells and middle ear cavities are clear. IMPRESSION: No acute intracranial abnormality. Remote left caudate head infarct. Prominent chronic microvascular changes. Moderate global atrophy. Mild mucosal thickening right maxillary sinus and minimal mucosal thickening left maxillary sinus. Electronically Signed   By: Genia Del M.D.   On: 01/08/2018 11:21   Dg Swallowing Func-speech Pathology  Result Date: 01/10/2018 Objective  Swallowing Evaluation: Type of Study: MBS-Modified Barium Swallow Study  Patient Details Name: Brian Montgomery MRN: [id number] Date of Birth: [birth year] Today's Date: 01/10/2018 Time: SLP Start Time (ACUTE ONLY): 1200 -SLP Stop Time (ACUTE ONLY): 1220 SLP Time Calculation (min) (ACUTE ONLY): 20 min Past Medical History: Past Medical History: Diagnosis Date . Arthritis  . Bladder cancer ([place])   infiltrative high grade papillary urothelial carcinoma  . Dementia   mild  . Dyslipidemia  . ED (erectile dysfunction)  . Hypertension  . Palsy ([place])   super nuclear palsy managed by [name] . Parkinson disease ([place])  . Pneumonia   x1 in adulthood  . Prostate cancer [facility])  Past Surgical History: Past Surgical History: Procedure Laterality Date . APPENDECTOMY   . BACK SURGERY    lumbar ' years ago  . COLONOSCOPY  2007  Dr.kaplan . CYSTOSCOPY N/A 03/22/2016  Procedure: CYSTOSCOPY;  Surgeon: Raynelle Bring, MD;  [facility];  Service: Urology;  Laterality: N/A; . CYSTOSCOPY W/ RETROGRADES N/A 01/21/2016  Procedure: CYSTOSCOPY WITH TURBT WITH  CLOT EVACUATION;  Surgeon: Raynelle Bring, MD;  [facility];  Service: Urology;  Laterality: N/A; . CYSTOSCOPY W/ RETROGRADES N/A 10/20/2016  Procedure: CYSTOSCOPY WITH BILATERAL RETROGRADE PYELOGRAM;  Surgeon: Raynelle Bring, MD;  [facility];  Service: Urology;  Laterality: N/A; . CYSTOSCOPY W/ URETERAL STENT PLACEMENT Right 06/26/2017  Procedure: CYSTOSCOPY WITH STENT CHANGE;  Surgeon: Raynelle Bring, MD;  [facility];  Service: Urology;  Laterality: Right; . cystoscopy with stent exchange    06-26-17 Dr Alinda Money . CYSTOSCOPY WITH STENT PLACEMENT Right 03/16/2017  Procedure: CYSTOSCOPY WITH STENT PLACEMENT;  Surgeon: Raynelle Bring, MD;  [facility];  Service: Urology;  Laterality: Right; . CYSTOSCOPY WITH STENT PLACEMENT Right 11/23/2017  Procedure: CYSTOSCOPY REMOVAL AND  STENT PLACEMENT;  Surgeon: Raynelle Bring, MD;  [facility];  Service: Urology;  Laterality: Right; .  INTRAMEDULLARY (IM) NAIL INTERTROCHANTERIC Left 07/01/2016  Procedure: INTRAMEDULLARY (IM) NAIL INTERTROCHANTERIC;  Surgeon: Tania Ade, MD;  Location: [place];  Service: Orthopedics;  Laterality: Left; . left total knee Left 2011 . PROSTATE BIOPSY N/A 03/22/2016  Procedure: BIOPSY TRANSRECTAL ULTRASONIC  PROSTATE (TUBP);  Surgeon: Raynelle Bring, MD;  [facility];  Service: Urology;  Laterality: N/A; . TRANSURETHRAL RESECTION OF BLADDER TUMOR N/A 03/22/2016  Procedure: TRANSURETHRAL RESECTION OF BLADDER TUMOR (TURBT);  Surgeon: Raynelle Bring, MD;  [facility];  Service: Urology;  Laterality: N/A; . TRANSURETHRAL RESECTION OF BLADDER TUMOR N/A 10/20/2016  Procedure: TRANSURETHRAL RESECTION OF BLADDER TUMOR (TURBT);  Surgeon: Raynelle Bring, MD;  [facility];  Service: Urology;  Laterality: N/A; . TRANSURETHRAL RESECTION OF BLADDER TUMOR N/A 03/16/2017  Procedure: TRANSURETHRAL RESECTION OF BLADDER TUMOR (TURBT);  Surgeon: Raynelle Bring, MD;  [facility];  Service: Urology;  Laterality: N/A; HPI: 82 yo male admitted to [facility] with pna, pt has h/o PSP (diagnosed 08/2016) and now has right LL pna.  Pt recently has been put on Seroquel = ? if this could contribute to aspiration pna risk.  Swallow ealuation ordered as pt with frequent coughing when consuming thin water.  Pt is afebrile with WBC 5.4- lungs sounds decreased.   Pt resides with his wife and she is his caregiver.  Pt is wheelchair bound - and wife reports recently feeding him in his hospital bed.   Subjective: Pt seen in radiology for MBS Assessment / Plan / Recommendation CHL IP CLINICAL IMPRESSIONS 01/10/2018 Clinical Impression Pt was given trials of thin liquid via cup and straw, puree, solid, and barium tablet. He presents with mild oropharyngeal dysphagia, characterized orally by poor bolus formation with premature spillage to the vallecular sinus across consistencies. Pharyngeally, pt exhibits a delayed swallow reflex with trigger at the  vallecula, due to decreased sensation for primary trigger site. Trace penetration was seen intermittently during the swallow of thin liquids, however, penetrate cleared spontaneously and was not aspirated, despite challenging pt with large consecutive boluses via cup and straw.  This is considered within normal limits for pt age. No post-swallow residue was seen on any consistency. Barium tablet was given with water, and cleared the oropharynx without difficulty or delay. Recommend continuing with regular solids and thin liquids, meds whole one at a time with liquid. Safe swallow precautions were reviewed and provided in written form to be posted at [facility]. SLP will follow briefly for education. SLP Visit Diagnosis Dysphagia, oropharyngeal phase (R13.12) Impact on safety and function Mild aspiration risk   CHL IP TREATMENT RECOMMENDATION 01/10/2018 Treatment Recommendations Therapy as outlined in treatment plan below   Prognosis 01/10/2018 Prognosis for Safe Diet Advancement Good CHL IP DIET RECOMMENDATION 01/10/2018 SLP Diet Recommendations Regular solids;Thin liquid Liquid Administration via Cup;Straw Medication Administration Whole meds with liquid Compensations Minimize environmental distractions;Slow rate;Small sips/bites Postural Changes Remain semi-upright after after feeds/meals (Comment);Seated upright at 90 degrees   CHL IP OTHER RECOMMENDATIONS 01/10/2018 Oral Care Recommendations Oral care TID   CHL IP FOLLOW UP RECOMMENDATIONS 01/10/2018 Follow up Recommendations None   CHL IP FREQUENCY AND DURATION 01/10/2018 Speech Therapy Frequency (ACUTE ONLY) min 1 x/week Treatment Duration 1 week      CHL IP ORAL PHASE 01/10/2018 Oral Phase Impaired Oral - Thin Cup/Straw Premature spillage Oral - Puree Premature spillage Oral - Regular Premature spillage  CHL IP PHARYNGEAL PHASE 01/10/2018 Pharyngeal Phase Impaired Pharyngeal- Thin Cup/Straw Delayed swallow initiation-vallecula;Penetration/Aspiration during swallow Pharyngeal  Material does not enter airway;Material enters airway, remains ABOVE vocal cords then ejected out Pharyngeal- Puree Delayed swallow initiation-vallecula Pharyngeal- Regular Delayed swallow initiation-vallecula Pharyngeal- Pill Delayed swallow initiation-vallecula  CHL IP CERVICAL ESOPHAGEAL PHASE 01/10/2018 Cervical Esophageal Phase [facility] Celia B. Quentin Ore, [facility] [place] Speech Language Pathologist [phone]  Shonna Chock 01/10/2018, 12:45 PM               Assessment/Plan  1. Community acquired pneumonia of right lower lobe of lung ([place]) - Chest x-ray showed right lower lobe infiltrates and was started on IV empiric antibiotic, antibiotic was changed to oral antibiotic and will continue X 7 days total, BBS recommended by speech therapist showed no signs of aspiration.    2. Anemia due to stage 3 chronic kidney disease (HCC) - stable Lab Results  Component Value Date   HGB 7.9 (L) 01/10/2018     3. Essential hypertension, benign - well-controlled, continue Zestoretic 10-12.5 mg 1 tab daily   4. History of DVT (deep vein thrombosis) - continue Eliquis 5mg  BID   5. History of bladder cancer - S/P radiation therapy,  stable, will follow- up with urology as needed   6. Dementia due to Parkinson's disease with behavioral disturbance - patient reported that he sees bug at time, wife reported that he shouts and talks to dead people, continue Donepezil 10 mg daily, Quetiapine 50 mg Q HS and Sinemet 25-100 mg 1 1/2 tab TID   7. Chronic depression - continue Celexa 20 mg daily, psych consult with NP    Family/ staff Communication:  Discussed plan of care with resident and wife.  Labs/tests ordered:  CBC and BMP in 1 week  Goals of care:   Short-term care   Durenda Age, NP [facility] and Adult Medicine [phone] (Monday-Friday 8:00 a.m. - 5:00 p.m.) [phone] (after hours)

## 2018-01-16 ENCOUNTER — Encounter: Payer: Self-pay | Admitting: Internal Medicine

## 2018-01-16 ENCOUNTER — Non-Acute Institutional Stay (SKILLED_NURSING_FACILITY): Payer: Medicare Other | Admitting: Internal Medicine

## 2018-01-16 DIAGNOSIS — N183 Chronic kidney disease, stage 3 unspecified: Secondary | ICD-10-CM

## 2018-01-16 DIAGNOSIS — C679 Malignant neoplasm of bladder, unspecified: Secondary | ICD-10-CM | POA: Diagnosis not present

## 2018-01-16 DIAGNOSIS — Z86718 Personal history of other venous thrombosis and embolism: Secondary | ICD-10-CM

## 2018-01-16 DIAGNOSIS — G20 Parkinson's disease: Secondary | ICD-10-CM

## 2018-01-16 DIAGNOSIS — D631 Anemia in chronic kidney disease: Secondary | ICD-10-CM | POA: Diagnosis not present

## 2018-01-16 DIAGNOSIS — J690 Pneumonitis due to inhalation of food and vomit: Secondary | ICD-10-CM | POA: Diagnosis not present

## 2018-01-16 NOTE — Assessment & Plan Note (Signed)
As per Dr [name]

## 2018-01-16 NOTE — Progress Notes (Signed)
NURSING HOME LOCATION:  [facility] ROOM NUMBER:  [id number]  CODE STATUS:  DNR  PCP:  Denita Lung, MD  [place] [address]   This is a comprehensive admission note to [facility] performed on this date less than 30 days from date of admission. Included are preadmission medical/surgical history; reconciled medication list; family history; social history and comprehensive review of systems.  Corrections and additions to the records were documented. Comprehensive physical exam was also performed. Additionally a clinical summary was entered for each active diagnosis pertinent to this admission in the Problem List to enhance continuity of care.  HPI:  The patient was hospitalized 7/1-01/12/18 for right lower lobe pneumonia attributed to aspiration. He received IV empiric antibiotic. Speech Therapy ordered a BBS which showed no sign of aspiration.  Hypovolemic hyponatremia resolved with IV fluid rehydration. PT/OT recommended SNF placement for rehabilitation. Labs pre-discharge revealed a creatinine of 1.50 with a GFR of 49. Normochromic, normocytic anemia was present with hemoglobin 7.9 and hematocrit 24.7. Highest hemoglobin and hematocrit on record were 10.2 and 31.1 on 03/08/17.  Past medical and surgical history:Includes advanced dementia, Parkinson's disease, dyslipidemia, history of bladder and prostate cancer status post radiation therapy, history of DVT on chronic anticoagulation, essential hypertension and chronic anemia. He has had multiple urologic procedures including TURP of the bladder tumor and cystoscopy with stent placement.   Social history:Nondrinker; former smoker. He supposedly smoked a pack a day for 2 years.  Family history:Reviewed   Review of systems:  Patient can provide no history. Fortunately his wife of almost 13 years was present who is an excellent historian.  She has noted some cough after meals especially when he drinks  water. She states that he eats fast and tends to keep his mouth full. She stated he was complaining of some hip pain after sitting for a period time today. He sees Dr. [name] for Parkinson's which is associated with hallucinations manifested as conversations with deceased friends. The hallucinations do not appear to agitate him although he does sometimes get anxious. She states he's had some increased mucus from his eyes. Mucinex D was ordered while in another skilled facility for cough and congestion. He sees Dr. Alinda Money for urologic care and is due to see him in August.  Constitutional: No fever, significant weight change, fatigue  Eyes: No redness, pain, vision change ENT/mouth: No nasal congestion, purulent discharge, earache, change in hearing, sore throat  Cardiovascular: No chest pain, palpitations, paroxysmal nocturnal dyspnea, claudication, edema  Respiratory: No sputum production, hemoptysis, DOE, significant snoring, apnea Gastrointestinal: No heartburn,  abdominal pain, nausea /vomiting, rectal bleeding, melena, change in bowels Genitourinary: No dysuria, hematuria, pyuria, incontinence, nocturia Musculoskeletal: No joint stiffness, joint swelling Dermatologic: No rash, pruritus, change in appearance of skin Neurologic: No dizziness, headache, syncope, seizures, numbness, tingling Psychiatric: No significant depression, insomnia, anorexia Endocrine: No change in hair/skin/nails, excessive thirst, excessive hunger, excessive urination  Hematologic/lymphatic: No significant bruising, lymphadenopathy, abnormal bleeding Allergy/immunology: No itchy/watery eyes, significant sneezing, urticaria, angioedema  Physical exam:  Pertinent or positive findings:He was sleeping during the exam and did not arouse or even open his eyes. When I attempted to examine his eyes he resisted. He also resisted opening his mouth for evaluation. Heart sounds are distant and slow. He has minor diffuse crackles  without increased work of breathing or use of accessory muscles. Pedal pulses are decreased.   General appearance: Adequately nourished; no acute distress Lymphatic: No lymphadenopathy about the head,  neck, axilla. Ears:  External ear exam shows no significant lesions or deformities.   Nose:  External nasal examination shows no deformity or inflammation. Nasal mucosa are pink and moist without lesions, exudates Oral exam: Lips  are healthy appearing. Neck:  No thyromegaly, masses, tenderness noted.    Heart:   without gallop, murmur, click, rub.  Lungs:  without wheezes, rhonchi,  rubs. Abdomen: Bowel sounds are normal.  Abdomen is soft and nontender with no organomegaly, hernias, masses. GU: Deferred  Extremities:  No cyanosis, clubbing, edema. Neurologic exam:  Balance, Rhomberg, finger to nose testing could not be completed due to clinical state Skin: Warm & dry w/o tenting. No significant lesions or rash.  See clinical summary under each active problem in the Problem List with associated updated therapeutic plan

## 2018-01-16 NOTE — Assessment & Plan Note (Signed)
Lifelong anticoagulation in the context of 2 separate urologic malignancies

## 2018-01-16 NOTE — Assessment & Plan Note (Addendum)
7 day course of Augmentin completed Wife has noted cough post fluid ingestion Speech Therapy to monitor

## 2018-01-16 NOTE — Assessment & Plan Note (Signed)
Normochromic, normocytic anemia, slowly progressive over the past 12 months

## 2018-01-16 NOTE — Patient Instructions (Signed)
See assessment and plan under each diagnosis in the problem list and acutely for this visit 

## 2018-01-16 NOTE — Assessment & Plan Note (Signed)
Follow-up appointment in August with Dr. Alinda Money as per wife

## 2018-01-22 LAB — BASIC METABOLIC PANEL
BUN: 20 (ref 4–21)
Creatinine: 1.3 (ref 0.6–1.3)
Glucose: 93
Potassium: 3.8 (ref 3.4–5.3)
SODIUM: 139 (ref 137–147)

## 2018-01-22 LAB — CBC AND DIFFERENTIAL
HEMATOCRIT: 27 — AB (ref 41–53)
Hemoglobin: 9 — AB (ref 13.5–17.5)
NEUTROS ABS: 6
PLATELETS: 246 (ref 150–399)
WBC: 6.8

## 2018-02-02 ENCOUNTER — Encounter: Payer: Self-pay | Admitting: Adult Health

## 2018-02-02 ENCOUNTER — Non-Acute Institutional Stay (SKILLED_NURSING_FACILITY): Payer: Medicare Other | Admitting: Adult Health

## 2018-02-02 ENCOUNTER — Other Ambulatory Visit: Payer: Self-pay | Admitting: Adult Health

## 2018-02-02 DIAGNOSIS — F02818 Dementia in other diseases classified elsewhere, unspecified severity, with other behavioral disturbance: Secondary | ICD-10-CM

## 2018-02-02 DIAGNOSIS — F329 Major depressive disorder, single episode, unspecified: Secondary | ICD-10-CM | POA: Diagnosis not present

## 2018-02-02 DIAGNOSIS — Z86718 Personal history of other venous thrombosis and embolism: Secondary | ICD-10-CM | POA: Diagnosis not present

## 2018-02-02 DIAGNOSIS — G20 Parkinson's disease: Secondary | ICD-10-CM

## 2018-02-02 DIAGNOSIS — I10 Essential (primary) hypertension: Secondary | ICD-10-CM | POA: Diagnosis not present

## 2018-02-02 DIAGNOSIS — F0281 Dementia in other diseases classified elsewhere with behavioral disturbance: Secondary | ICD-10-CM

## 2018-02-02 DIAGNOSIS — F32A Depression, unspecified: Secondary | ICD-10-CM

## 2018-02-02 DIAGNOSIS — E785 Hyperlipidemia, unspecified: Secondary | ICD-10-CM | POA: Diagnosis not present

## 2018-02-02 DIAGNOSIS — J690 Pneumonitis due to inhalation of food and vomit: Secondary | ICD-10-CM

## 2018-02-02 MED ORDER — DONEPEZIL HCL 10 MG PO TABS: ORAL_TABLET | ORAL | 0 refills | Status: AC

## 2018-02-02 MED ORDER — APIXABAN 5 MG PO TABS: 5.0000 mg | ORAL_TABLET | Freq: Two times a day (BID) | ORAL | 0 refills | Status: AC

## 2018-02-02 MED ORDER — LISINOPRIL-HYDROCHLOROTHIAZIDE 10-12.5 MG PO TABS: 1.0000 | ORAL_TABLET | Freq: Every day | ORAL | 0 refills | Status: DC

## 2018-02-02 MED ORDER — SIMVASTATIN 20 MG PO TABS: 20.0000 mg | ORAL_TABLET | Freq: Every day | ORAL | 0 refills | Status: AC

## 2018-02-02 MED ORDER — CITALOPRAM HYDROBROMIDE 20 MG PO TABS: ORAL_TABLET | ORAL | 0 refills | Status: AC

## 2018-02-02 MED ORDER — QUETIAPINE FUMARATE 50 MG PO TABS: 50.0000 mg | ORAL_TABLET | Freq: Every day | ORAL | 0 refills | Status: AC

## 2018-02-02 MED ORDER — CARBIDOPA-LEVODOPA 25-100 MG PO TABS: 1.5000 | ORAL_TABLET | Freq: Three times a day (TID) | ORAL | 0 refills | Status: AC

## 2018-02-02 MED ORDER — HYPROMELLOSE (GONIOSCOPIC) 2.5 % OP SOLN: 1.0000 [drp] | Freq: Two times a day (BID) | OPHTHALMIC | 0 refills | Status: AC | PRN

## 2018-02-02 NOTE — Progress Notes (Signed)
Location:  [facility] Room Number: 215-A Place of Service:  SNF (31) Provider:  Durenda Age, NP  Patient Care Team: Denita Lung, MD as PCP - General (Family Medicine) Raynelle Bring, MD as Consulting Physician (Urology)  Extended Emergency Contact Information Primary Emergency Contact: [facility] Address: [address] [place], [place] [address] Johnnette Litter of [place] Phone: [phone] Relation: Spouse Secondary Emergency Contact: [name] Address: [address]          [place], [place] [address] Johnnette Litter of [place] Phone: [phone] Mobile Phone: [phone] Relation: Daughter  Code Status:  DNR  Goals of care: Advanced Directive information Advanced Directives 01/16/2018  Does Patient Have a Medical Advance Directive? Yes  Type of Advance Directive Out of facility DNR (pink MOST or yellow form)  Does patient want to make changes to medical advance directive? No - Patient declined  Copy of [place] in Chart? No - copy requested  Would patient like information on creating a medical advance directive? -     Chief Complaint  Patient presents with  . Discharge Note    The patient is seen for a discharge visit, to discharge 02/03/18    HPI:  Pt is a 82 y.o. male seen today for discharge.  HE is to discharge home on 02/03/18 with home health OT, PT, and ST.   He was admitted to [facility] on 01/12/18 from [facility] admission dates 7/01 to 01/12/18 for aspiration pneumonia. Chest x-ray showed right lower lobe infiltrates and was started on IV empiric antibiotic. She was shifted to oral antibiotic upon hospital discharge and has completed 7 days total. He has a PMH of CKD stage III, essential hypertension, bladder and kidney cancer, DVT on Xarelto, and dementia.    Patient was admitted to this facility for short-term rehabilitation after the patient's recent  hospitalization.  Patient has completed SNF rehabilitation and therapy has cleared the patient for discharge.   Past Medical History:  Diagnosis Date  . Arthritis   . Bladder cancer ([place])    infiltrative high grade papillary urothelial carcinoma   . Dementia    mild   . Dyslipidemia   . ED (erectile dysfunction)   . Hypertension   . 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 Parkinson disease ([place])   . Pneumonia    x1 in adulthood   . Prostate cancer [facility])    Past Surgical History:  Procedure Laterality Date  . APPENDECTOMY    . BACK SURGERY     lumbar ' years ago   . COLONOSCOPY  2007   Dr.kaplan  . CYSTOSCOPY N/A 03/22/2016   Procedure: CYSTOSCOPY;  Surgeon: Raynelle Bring, MD;  [facility];  Service: Urology;  Laterality: N/A;  . CYSTOSCOPY W/ RETROGRADES N/A 01/21/2016   Procedure: CYSTOSCOPY WITH TURBT WITH  CLOT EVACUATION;  Surgeon: Raynelle Bring, MD;  [facility];  Service: Urology;  Laterality: N/A;  . CYSTOSCOPY W/ RETROGRADES N/A 10/20/2016   Procedure: CYSTOSCOPY WITH BILATERAL RETROGRADE PYELOGRAM;  Surgeon: Raynelle Bring, MD;  [facility];  Service: Urology;  Laterality: N/A;  . CYSTOSCOPY W/ URETERAL STENT PLACEMENT Right 06/26/2017   Procedure: CYSTOSCOPY WITH STENT CHANGE;  Surgeon: Raynelle Bring, MD;  [facility];  Service: Urology;  Laterality: Right;  . cystoscopy with stent exchange     06-26-17 Dr Alinda Money  . CYSTOSCOPY WITH  STENT PLACEMENT Right 03/16/2017   Procedure: CYSTOSCOPY WITH STENT PLACEMENT;  Surgeon: Raynelle Bring, MD;  [facility];  Service: Urology;  Laterality: Right;  . CYSTOSCOPY WITH STENT PLACEMENT Right 11/23/2017   Procedure: CYSTOSCOPY REMOVAL AND  STENT PLACEMENT;  Surgeon: Raynelle Bring, MD;  [facility];  Service: Urology;  Laterality: Right;  . INTRAMEDULLARY (IM) NAIL INTERTROCHANTERIC Left 07/01/2016   Procedure: INTRAMEDULLARY (IM) NAIL INTERTROCHANTERIC;  Surgeon: Tania Ade, MD;   Location: [facility];  Service: Orthopedics;  Laterality: Left;  . left total knee Left 2011  . PROSTATE BIOPSY N/A 03/22/2016   Procedure: BIOPSY TRANSRECTAL ULTRASONIC PROSTATE (TUBP);  Surgeon: Raynelle Bring, MD;  [facility];  Service: Urology;  Laterality: N/A;  . TRANSURETHRAL RESECTION OF BLADDER TUMOR N/A 03/22/2016   Procedure: TRANSURETHRAL RESECTION OF BLADDER TUMOR (TURBT);  Surgeon: Raynelle Bring, MD;  [facility];  Service: Urology;  Laterality: N/A;  . TRANSURETHRAL RESECTION OF BLADDER TUMOR N/A 10/20/2016   Procedure: TRANSURETHRAL RESECTION OF BLADDER TUMOR (TURBT);  Surgeon: Raynelle Bring, MD;  [facility];  Service: Urology;  Laterality: N/A;  . TRANSURETHRAL RESECTION OF BLADDER TUMOR N/A 03/16/2017   Procedure: TRANSURETHRAL RESECTION OF BLADDER TUMOR (TURBT);  Surgeon: Raynelle Bring, MD;  [facility];  Service: Urology;  Laterality: N/A;    No Known Allergies  Outpatient Encounter Medications as of 02/02/2018  Medication Sig  . acetaminophen (TYLENOL) 500 MG tablet Take 500 mg by mouth every 6 (six) hours as needed for moderate pain or headache.  Marland Kitchen apixaban (ELIQUIS) 5 MG TABS tablet Take 1 tablet (5 mg total) by mouth 2 (two) times daily.  . carbidopa-levodopa (SINEMET IR) 25-100 MG tablet Take 1.5 tablets by mouth 3 (three) times daily.  . citalopram (CELEXA) 20 MG tablet TAKE 1 TABLET(20 MG) BY MOUTH DAILY  . donepezil (ARICEPT) 10 MG tablet TAKE 1 TABLET(10 MG) BY MOUTH AT BEDTIME  . hydroxypropyl methylcellulose / hypromellose (ISOPTO TEARS / GONIOVISC) 2.5 % ophthalmic solution Place 1 drop into both eyes 2 (two) times daily as needed for dry eyes.  Marland Kitchen lisinopril-hydrochlorothiazide (PRINZIDE,ZESTORETIC) 10-12.5 MG tablet Take 1 tablet by mouth daily.  . Melatonin 3 MG CAPS Take 3 mg by mouth every other day as needed (for sleep.).   Marland Kitchen pseudoephedrine-guaifenesin (MUCINEX D) 60-600 MG 12 hr tablet Take 1 tablet by mouth at bedtime as needed for  congestion.   . QUEtiapine (SEROQUEL) 50 MG tablet TAKE 1 TABLET BY MOUTH AT BEDTIME  . simvastatin (ZOCOR) 20 MG tablet TAKE 1 TABLET BY MOUTH DAILY   No facility-administered encounter medications on file as of 02/02/2018.     Review of Systems  GENERAL: No change in appetite, no fatigue, no weight changes, no fever, chills or weakness MOUTH and THROAT: Denies oral discomfort, gingival pain or bleeding RESPIRATORY: no cough, SOB, DOE, wheezing, hemoptysis CARDIAC: No chest pain, edema or palpitations GI: No abdominal pain, diarrhea, constipation, heart burn, nausea or vomiting PSYCHIATRIC: Denies feelings of depression or anxiety. No report of hallucinations, insomnia, paranoia, or agitation   Immunization History  Administered Date(s) Administered  . Influenza Split 05/25/2012  . Influenza Whole 08/05/2009  . Influenza, High Dose Seasonal PF 04/29/2013, 06/11/2014, 06/25/2015, 04/11/2017  . Pneumococcal Conjugate-13 08/06/2013  . Pneumococcal Polysaccharide-23 07/01/2008  . Tdap 07/01/2008  . Zoster 08/05/2009   Pertinent  Health Maintenance Due  Topic Date Due  . INFLUENZA VACCINE  02/08/2018  . PNA vac Low Risk Adult  Completed   Fall  Risk  08/24/2017 03/14/2017 08/25/2016 03/17/2016 01/26/2016  Falls in the past year? No No Yes Yes Yes  Number falls in past yr: - - 2 or more 1 2 or more  Injury with Fall? - - Yes Yes Yes  Risk Factor Category  - - High Fall Risk High Fall Risk High Fall Risk  Risk for fall due to : - - - History of fall(s);Impaired mobility;Other (Comment) -  Risk for fall due to: Comment - - - related to effects of palsy -  Follow up - - - Falls prevention discussed Falls evaluation completed   Functional Status Survey:    Vitals:   02/02/18 0831  BP: 118/71  Pulse: 74  Resp: 18  Temp: (!) 97.3 F (36.3 C)  TempSrc: Oral  SpO2: 98%  Weight: 155 lb 9.6 oz (70.6 kg)  Height: 5\' 6"  (1.676 m)   Body mass index is 25.11 kg/m.  Physical  Exam  GENERAL APPEARANCE: Well nourished. In no acute distress. Normal body habitus SKIN:  Skin is warm and dry.  MOUTH and THROAT: Lips are without lesions. Oral mucosa is moist and without lesions. Tongue is normal in shape, size, and color and without lesions RESPIRATORY: Breathing is even & unlabored, BS CTAB CARDIAC: RRR, no murmur,no extra heart sounds, no edema GI: Abdomen soft, normal BS, no masses, no tenderness EXTREMITIES:  Able to move X 4 extremities, BLE with generalized weakness PSYCHIATRIC: Alert to self, disoriented to time and place. Affect and behavior are appropriate    Labs reviewed: Recent Labs    01/08/18 1029 01/09/18 0556 01/10/18 0545 01/22/18  NA 145 146 143 139  K 3.7 3.8 3.6 3.8  CL 117 116 114  --   CO2 17 24 23   --   GLUCOSE 111 86 99  --   BUN 31 29 26* 20  CREATININE 1.92 1.70 1.50 1.3  CALCIUM 9.3 8.9 8.6  --    Recent Labs    01/08/18 1029  AST 14  ALT <5  ALKPHOS 65  BILITOT 0.5  PROT 7.3  ALBUMIN 3.7    Recent Labs    09/20/17 1358  01/08/18 1029 01/09/18 0556 01/10/18 0545 01/22/18  WBC 6.1   < > 6.7 5.4 4.4 6.8  NEUTROABS 4.7  --   --   --   --  6  HGB 9.3   < > 8.9 7.6 7.9 9.0*  HCT 28.9   < > 27.1 24.1 24.7 27*  MCV 90.9   < > 90.3 91.6 91.1  --   PLT 169   < > 235 208 211 246   < > = values in this interval not displayed.    Lab Results  Component Value Date   TSH 0.856 06/13/2016   No results found for: HGBA1C Lab Results  Component Value Date   CHOL 180 06/25/2015   HDL 81 06/25/2015   LDLCALC 88 06/25/2015   TRIG 56 06/25/2015   CHOLHDL 2.2 06/25/2015    Significant Diagnostic Results in last 30 days:  Dg Chest 2 View  Result Date: 01/08/2018 CLINICAL DATA:  Worsening altered mental status and lethargy. History of Parkinson's disease EXAM: CHEST - 2 VIEW COMPARISON:  Chest x-ray of June 30, 2016 FINDINGS: The lungs are less well inflated. The interstitial markings are increased especially in the  right mid and lower lung. The heart is normal in size. The pulmonary vascularity is not clearly engorged. The trachea is midline. The bony thorax  exhibits no acute abnormality. IMPRESSION: Increased density predominantly in the right lower lobe worrisome for pneumonia. No overt CHF. Followup PA and lateral chest X-ray is recommended in 3-4 weeks following trial of antibiotic therapy to ensure resolution and exclude underlying malignancy. Electronically Signed   By: David  Martinique M.D.   On: 01/08/2018 11:11   Ct Head Wo Contrast  Result Date: 01/08/2018 CLINICAL DATA:  82 year old male with altered mental status. Parkinson's disease. Dementia. Hypertension. Initial encounter. EXAM: CT HEAD WITHOUT CONTRAST TECHNIQUE: Contiguous axial images were obtained from the base of the skull through the vertex without intravenous contrast. COMPARISON:  09/21/2017 head CT. FINDINGS: Brain: No intracranial hemorrhage or CT evidence of large acute infarct. Remote left caudate head infarct. Prominent chronic microvascular changes. Moderate global atrophy. No intracranial mass lesion noted on this unenhanced exam. Vascular: Vascular calcifications. Skull: No acute abnormality. Sinuses/Orbits: No acute orbital abnormality. Mild mucosal thickening right maxillary sinus and minimal mucosal thickening left maxillary sinus. Other: Mastoid air cells and middle ear cavities are clear. IMPRESSION: No acute intracranial abnormality. Remote left caudate head infarct. Prominent chronic microvascular changes. Moderate global atrophy. Mild mucosal thickening right maxillary sinus and minimal mucosal thickening left maxillary sinus. Electronically Signed   By: Genia Del M.D.   On: 01/08/2018 11:21   Dg Swallowing Func-speech Pathology  Result Date: 01/10/2018 Objective Swallowing Evaluation: Type of Study: MBS-Modified Barium Swallow Study  Patient Details Name: Yuepheng Schaller MRN: [id number] Date of Birth: [birth year] Today's Date: 01/10/2018  Time: SLP Start Time (ACUTE ONLY): 1200 -SLP Stop Time (ACUTE ONLY): 1220 SLP Time Calculation (min) (ACUTE ONLY): 20 min Past Medical History: Past Medical History: Diagnosis Date . Arthritis  . Bladder cancer ([place])   infiltrative high grade papillary urothelial carcinoma  . Dementia   mild  . Dyslipidemia  . ED (erectile dysfunction)  . Hypertension  . 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 Parkinson disease ([place])  . Pneumonia   x1 in adulthood  . Prostate cancer [facility])  Past Surgical History: Past Surgical History: Procedure Laterality Date . APPENDECTOMY   . BACK SURGERY    lumbar ' years ago  . COLONOSCOPY  2007  Dr.kaplan . CYSTOSCOPY N/A 03/22/2016  Procedure: CYSTOSCOPY;  Surgeon: Raynelle Bring, MD;  [facility];  Service: Urology;  Laterality: N/A; . CYSTOSCOPY W/ RETROGRADES N/A 01/21/2016  Procedure: CYSTOSCOPY WITH TURBT WITH  CLOT EVACUATION;  Surgeon: Raynelle Bring, MD;  [facility];  Service: Urology;  Laterality: N/A; . CYSTOSCOPY W/ RETROGRADES N/A 10/20/2016  Procedure: CYSTOSCOPY WITH BILATERAL RETROGRADE PYELOGRAM;  Surgeon: Raynelle Bring, MD;  [facility];  Service: Urology;  Laterality: N/A; . CYSTOSCOPY W/ URETERAL STENT PLACEMENT Right 06/26/2017  Procedure: CYSTOSCOPY WITH STENT CHANGE;  Surgeon: Raynelle Bring, MD;  [facility];  Service: Urology;  Laterality: Right; . cystoscopy with stent exchange    06-26-17 Dr Alinda Money . CYSTOSCOPY WITH STENT PLACEMENT Right 03/16/2017  Procedure: CYSTOSCOPY WITH STENT PLACEMENT;  Surgeon: Raynelle Bring, MD;  [facility];  Service: Urology;  Laterality: Right; . CYSTOSCOPY WITH STENT PLACEMENT Right 11/23/2017  Procedure: CYSTOSCOPY REMOVAL AND  STENT PLACEMENT;  Surgeon: Raynelle Bring, MD;  [facility];  Service: Urology;  Laterality: Right; . INTRAMEDULLARY (IM) NAIL INTERTROCHANTERIC Left 07/01/2016  Procedure: INTRAMEDULLARY (IM) NAIL INTERTROCHANTERIC;  Surgeon: Tania Ade, MD;  Location: [facility];  Service:  Orthopedics;  Laterality: Left; . left total knee Left 2011 . PROSTATE BIOPSY N/A 03/22/2016  Procedure: BIOPSY TRANSRECTAL  ULTRASONIC PROSTATE (TUBP);  Surgeon: Raynelle Bring, MD;  [facility];  Service: Urology;  Laterality: N/A; . TRANSURETHRAL RESECTION OF BLADDER TUMOR N/A 03/22/2016  Procedure: TRANSURETHRAL RESECTION OF BLADDER TUMOR (TURBT);  Surgeon: Raynelle Bring, MD;  [facility];  Service: Urology;  Laterality: N/A; . TRANSURETHRAL RESECTION OF BLADDER TUMOR N/A 10/20/2016  Procedure: TRANSURETHRAL RESECTION OF BLADDER TUMOR (TURBT);  Surgeon: Raynelle Bring, MD;  [facility];  Service: Urology;  Laterality: N/A; . TRANSURETHRAL RESECTION OF BLADDER TUMOR N/A 03/16/2017  Procedure: TRANSURETHRAL RESECTION OF BLADDER TUMOR (TURBT);  Surgeon: Raynelle Bring, MD;  [facility];  Service: Urology;  Laterality: N/A; HPI: 82 yo male admitted to [facility] with pna, pt has h/o PSP (diagnosed 08/2016) and now has right LL pna.  Pt recently has been put on Seroquel = ? if this could contribute to aspiration pna risk.  Swallow ealuation ordered as pt with frequent coughing when consuming thin water.  Pt is afebrile with WBC 5.4- lungs sounds decreased.   Pt resides with his wife and she is his caregiver.  Pt is wheelchair bound - and wife reports recently feeding him in his hospital bed.   Subjective: Pt seen in radiology for MBS Assessment / Plan / Recommendation CHL IP CLINICAL IMPRESSIONS 01/10/2018 Clinical Impression Pt was given trials of thin liquid via cup and straw, puree, solid, and barium tablet. He presents with mild oropharyngeal dysphagia, characterized orally by poor bolus formation with premature spillage to the vallecular sinus across consistencies. Pharyngeally, pt exhibits a delayed swallow reflex with trigger at the vallecula, due to decreased sensation for primary trigger site. Trace penetration was seen intermittently during the swallow of thin liquids, however, penetrate cleared  spontaneously and was not aspirated, despite challenging pt with large consecutive boluses via cup and straw.  This is considered within normal limits for pt age. No post-swallow residue was seen on any consistency. Barium tablet was given with water, and cleared the oropharynx without difficulty or delay. Recommend continuing with regular solids and thin liquids, meds whole one at a time with liquid. Safe swallow precautions were reviewed and provided in written form to be posted at [facility]. SLP will follow briefly for education. SLP Visit Diagnosis Dysphagia, oropharyngeal phase (R13.12) Impact on safety and function Mild aspiration risk   CHL IP TREATMENT RECOMMENDATION 01/10/2018 Treatment Recommendations Therapy as outlined in treatment plan below   Prognosis 01/10/2018 Prognosis for Safe Diet Advancement Good CHL IP DIET RECOMMENDATION 01/10/2018 SLP Diet Recommendations Regular solids;Thin liquid Liquid Administration via Cup;Straw Medication Administration Whole meds with liquid Compensations Minimize environmental distractions;Slow rate;Small sips/bites Postural Changes Remain semi-upright after after feeds/meals (Comment);Seated upright at 90 degrees   CHL IP OTHER RECOMMENDATIONS 01/10/2018 Oral Care Recommendations Oral care TID   CHL IP FOLLOW UP RECOMMENDATIONS 01/10/2018 Follow up Recommendations None   CHL IP FREQUENCY AND DURATION 01/10/2018 Speech Therapy Frequency (ACUTE ONLY) min 1 x/week Treatment Duration 1 week      CHL IP ORAL PHASE 01/10/2018 Oral Phase Impaired Oral - Thin Cup/Straw Premature spillage Oral - Puree Premature spillage Oral - Regular Premature spillage  CHL IP PHARYNGEAL PHASE 01/10/2018 Pharyngeal Phase Impaired Pharyngeal- Thin Cup/Straw Delayed swallow initiation-vallecula;Penetration/Aspiration during swallow Pharyngeal Material does not enter airway;Material enters airway, remains ABOVE vocal cords then ejected out Pharyngeal- Puree Delayed swallow initiation-vallecula Pharyngeal- Regular  Delayed swallow initiation-vallecula Pharyngeal- Pill Delayed swallow initiation-vallecula  CHL IP CERVICAL ESOPHAGEAL PHASE 01/10/2018 Cervical Esophageal Phase [facility] Celia B. Quentin Ore, [place], [place] Speech Language Pathologist  [place] 01/10/2018, 12:45 PM               Assessment/Plan  1. Aspiration pneumonia of right lower lobe, unspecified aspiration pneumonia type [facility]) - has completed antibiotic course, will have Home health PT and OT for therapeutic strengthening exercises, ST for dysphagia  2. Essential hypertension, benign - lisinopril-hydrochlorothiazide (PRINZIDE,ZESTORETIC) 10-12.5 MG tablet; Take 1 tablet by mouth daily.  Dispense: 30 tablet; Refill: 0  3. History of DVT (deep vein thrombosis) - apixaban (ELIQUIS) 5 MG TABS tablet; Take 1 tablet (5 mg total) by mouth 2 (two) times daily.  Dispense: 60 tablet; Refill: 0  4. Hyperlipidemia with target LDL less than 100 - simvastatin (ZOCOR) 20 MG tablet; Take 1 tablet (20 mg total) by mouth daily.  Dispense: 30 tablet; Refill: 0 Lab Results  Component Value Date   CHOL 180 06/25/2015   HDL 81 06/25/2015   LDLCALC 88 06/25/2015   TRIG 56 06/25/2015   CHOLHDL 2.2 06/25/2015    5. Parkinson's disease ([place]) - carbidopa-levodopa (SINEMET IR) 25-100 MG tablet; Take 1.5 tablets by mouth 3 (three) times daily.  Dispense: 135 tablet; Refill: 0  6. Dementia due to Parkinson's disease with behavioral disturbance ([place]) - donepezil (ARICEPT) 10 MG tablet; Take 1 tablet (10 mg) by mouth at bedtime.  Dispense: 30 tablet; Refill: 0 - QUEtiapine (SEROQUEL) 50 MG tablet; Take 1 tablet (50 mg total) by mouth at bedtime.  Dispense: 30 tablet; Refill: 0  7. Chronic depression - mood is stable - citalopram (CELEXA) 20 MG tablet; TAKE 1 TABLET(20 MG) BY MOUTH DAILY  Dispense: 30 tablet; Refill: 0     I have filled out patient's discharge paperwork and written prescriptions.  Patient will receive home health PT, OT, and ST.  DME  provided:  [facility]  Total discharge time: Greater than 30 minutes Greater than 50% was spent in counseling and coordination of care; wife and son-in-law at bedside.  Discharge time involved coordination of the discharge process with social worker, nursing staff and therapy department. Medical justification for home health services/DME verified.    Durenda Age, NP [facility] [phone] (Monday-Friday 8:00 a.m. - 5:00 p.m.) [phone] (after hours)

## 2018-02-05 ENCOUNTER — Other Ambulatory Visit: Payer: Self-pay | Admitting: Adult Health

## 2018-02-05 DIAGNOSIS — E785 Hyperlipidemia, unspecified: Secondary | ICD-10-CM

## 2018-02-07 ENCOUNTER — Inpatient Hospital Stay: Payer: Medicare Other | Admitting: Family Medicine

## 2018-02-22 ENCOUNTER — Ambulatory Visit: Payer: Medicare Other | Admitting: Neurology

## 2018-02-28 ENCOUNTER — Non-Acute Institutional Stay (SKILLED_NURSING_FACILITY): Payer: Medicare Other | Admitting: Adult Health

## 2018-02-28 ENCOUNTER — Encounter: Payer: Self-pay | Admitting: Adult Health

## 2018-02-28 DIAGNOSIS — F329 Major depressive disorder, single episode, unspecified: Secondary | ICD-10-CM

## 2018-02-28 DIAGNOSIS — G20 Parkinson's disease: Secondary | ICD-10-CM

## 2018-02-28 DIAGNOSIS — J690 Pneumonitis due to inhalation of food and vomit: Secondary | ICD-10-CM | POA: Diagnosis not present

## 2018-02-28 DIAGNOSIS — F0281 Dementia in other diseases classified elsewhere with behavioral disturbance: Secondary | ICD-10-CM

## 2018-02-28 DIAGNOSIS — Z86718 Personal history of other venous thrombosis and embolism: Secondary | ICD-10-CM

## 2018-02-28 DIAGNOSIS — I10 Essential (primary) hypertension: Secondary | ICD-10-CM | POA: Diagnosis not present

## 2018-02-28 DIAGNOSIS — F32A Depression, unspecified: Secondary | ICD-10-CM

## 2018-02-28 DIAGNOSIS — E785 Hyperlipidemia, unspecified: Secondary | ICD-10-CM | POA: Diagnosis not present

## 2018-02-28 NOTE — Progress Notes (Signed)
Location:  [place] Room Number: 215-A Place of Service:  SNF (31) Provider:  Durenda Age, NP  Patient Care Team: Denita Lung, MD as PCP - General (Family Medicine) Raynelle Bring, MD as Consulting Physician (Urology)  Extended Emergency Contact Information Primary Emergency Contact: [facility] Address: [address] [place], [place] [address] Johnnette Litter of [place] Phone: [phone] Relation: Spouse Secondary Emergency Contact: [name] Address: [address]          [place], [place] [address] Johnnette Litter of [place] Phone: [phone] Mobile Phone: [phone] Relation: Daughter  Code Status:  DNR  Goals of care: Advanced Directive information Advanced Directives 02/28/2018  Does Patient Have a Medical Advance Directive? Yes  Type of Advance Directive Out of facility DNR (pink MOST or yellow form)  Does patient want to make changes to medical advance directive? No - Patient declined  Copy of [place] in Chart? No - copy requested  Would patient like information on creating a medical advance directive? -     Chief Complaint  Patient presents with  . Discharge Note    The patient is seen for discharge.  Was seen on 02/02/18 for discharge, but he did not leave the facility.      HPI:  Pt is an 82 y.o. male seen today for medical management of chronic diseases.  He is to discharge home on 03/02/18 with home health OT, PT, ST, Nursing, and CNA services. He was not discharged on 7/26 due to extension of rehabilitation.  He has been admitted to [facility] on 01/12/18 from [facility] admission date 01/126/05/19 for aspiration pneumonia he was put on IV empiric antibiotic then shifted to oral antibiotics on discharge. He has a PMH of CKD stage 3, essential hypertension, bladder and kidney cancer, DVT on Xarelto, and dementia.    Patient was admitted to this facility for  short-term rehabilitation after the patient's recent hospitalization.  Patient has completed SNF rehabilitation and therapy has cleared the patient for discharge.   Past Medical History:  Diagnosis Date  . Arthritis   . Bladder cancer ([place])    infiltrative high grade papillary urothelial carcinoma   . Dementia    mild   . Dyslipidemia   . ED (erectile dysfunction)   . Hypertension   . 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 Parkinson disease ([employer])   . Pneumonia    x1 in adulthood   . Prostate cancer [facility])    Past Surgical History:  Procedure Laterality Date  . APPENDECTOMY    . BACK SURGERY     lumbar ' years ago   . COLONOSCOPY  2007   Dr.kaplan  . CYSTOSCOPY N/A 03/22/2016   Procedure: CYSTOSCOPY;  Surgeon: Raynelle Bring, MD;  Location: [facility];  Service: Urology;  Laterality: N/A;  . CYSTOSCOPY W/ RETROGRADES N/A 01/21/2016   Procedure: CYSTOSCOPY WITH TURBT WITH  CLOT EVACUATION;  Surgeon: Raynelle Bring, MD;  [facility];  Service: Urology;  Laterality: N/A;  . CYSTOSCOPY W/ RETROGRADES N/A 10/20/2016   Procedure: CYSTOSCOPY WITH BILATERAL RETROGRADE PYELOGRAM;  Surgeon: Raynelle Bring, MD;  [facility];  Service: Urology;  Laterality: N/A;  . CYSTOSCOPY W/ URETERAL STENT PLACEMENT Right 06/26/2017   Procedure: CYSTOSCOPY WITH STENT CHANGE;  Surgeon: Raynelle Bring, MD;  [facility];  Service: Urology;  Laterality: Right;  . cystoscopy with stent  exchange     06-26-17 Dr Alinda Money  . CYSTOSCOPY WITH STENT PLACEMENT Right 03/16/2017   Procedure: CYSTOSCOPY WITH STENT PLACEMENT;  Surgeon: Raynelle Bring, MD;  [facility];  Service: Urology;  Laterality: Right;  . CYSTOSCOPY WITH STENT PLACEMENT Right 11/23/2017   Procedure: CYSTOSCOPY REMOVAL AND  STENT PLACEMENT;  Surgeon: Raynelle Bring, MD;  [facility];  Service: Urology;  Laterality: Right;  . INTRAMEDULLARY (IM) NAIL INTERTROCHANTERIC Left 07/01/2016   Procedure: INTRAMEDULLARY (IM) NAIL  INTERTROCHANTERIC;  Surgeon: Tania Ade, MD;  Location: [facility];  Service: Orthopedics;  Laterality: Left;  . left total knee Left 2011  . PROSTATE BIOPSY N/A 03/22/2016   Procedure: BIOPSY TRANSRECTAL ULTRASONIC PROSTATE (TUBP);  Surgeon: Raynelle Bring, MD;  [facility];  Service: Urology;  Laterality: N/A;  . TRANSURETHRAL RESECTION OF BLADDER TUMOR N/A 03/22/2016   Procedure: TRANSURETHRAL RESECTION OF BLADDER TUMOR (TURBT);  Surgeon: Raynelle Bring, MD;  [facility];  Service: Urology;  Laterality: N/A;  . TRANSURETHRAL RESECTION OF BLADDER TUMOR N/A 10/20/2016   Procedure: TRANSURETHRAL RESECTION OF BLADDER TUMOR (TURBT);  Surgeon: Raynelle Bring, MD;  [facility];  Service: Urology;  Laterality: N/A;  . TRANSURETHRAL RESECTION OF BLADDER TUMOR N/A 03/16/2017   Procedure: TRANSURETHRAL RESECTION OF BLADDER TUMOR (TURBT);  Surgeon: Raynelle Bring, MD;  [facility];  Service: Urology;  Laterality: N/A;    No Known Allergies  Outpatient Encounter Medications as of 02/28/2018  Medication Sig  . acetaminophen (TYLENOL) 500 MG tablet Take 500 mg by mouth every 6 (six) hours as needed for moderate pain or headache.  Marland Kitchen apixaban (ELIQUIS) 5 MG TABS tablet Take 1 tablet (5 mg total) by mouth 2 (two) times daily.  . carbidopa-levodopa (SINEMET IR) 25-100 MG tablet Take 1.5 tablets by mouth 3 (three) times daily.  . citalopram (CELEXA) 20 MG tablet TAKE 1 TABLET(20 MG) BY MOUTH DAILY  . donepezil (ARICEPT) 10 MG tablet Take 1 tablet (10 mg) by mouth at bedtime.  . hydroxypropyl methylcellulose / hypromellose (ISOPTO TEARS / GONIOVISC) 2.5 % ophthalmic solution Place 1 drop into both eyes 2 (two) times daily as needed for dry eyes.  Marland Kitchen lisinopril-hydrochlorothiazide (PRINZIDE,ZESTORETIC) 10-12.5 MG tablet Take 1 tablet by mouth daily.  . Melatonin 3 MG CAPS Take 3 mg by mouth every other day as needed (for sleep.).   Marland Kitchen NUTRITIONAL SUPPLEMENT LIQD Take 120 mLs by mouth daily. MedPass  .  pseudoephedrine-guaifenesin (MUCINEX D) 60-600 MG 12 hr tablet Take 1 tablet by mouth at bedtime as needed for congestion.   . QUEtiapine (SEROQUEL) 50 MG tablet Take 1 tablet (50 mg total) by mouth at bedtime.  . simvastatin (ZOCOR) 20 MG tablet Take 1 tablet (20 mg total) by mouth daily.   No facility-administered encounter medications on file as of 02/28/2018.     Review of Systems  GENERAL: No change in appetite, no fatigue, no weight changes, no fever, chills or weakness MOUTH and THROAT: Denies oral discomfort, gingival pain or bleeding RESPIRATORY: no cough, SOB, DOE, wheezing, hemoptysis CARDIAC: No chest pain, edema or palpitations GI: No abdominal pain, diarrhea, constipation, heart burn, nausea or vomiting GU: Denies dysuria, frequency, hematuria, or discharge PSYCHIATRIC: Denies feelings of depression or anxiety. No report of hallucinations, insomnia, paranoia, or agitation   Immunization History  Administered Date(s) Administered  . Influenza Split 05/25/2012  . Influenza Whole 08/05/2009  . Influenza, High Dose Seasonal PF 04/29/2013, 06/11/2014, 06/25/2015, 04/11/2017  . Pneumococcal Conjugate-13 08/06/2013  . Pneumococcal Polysaccharide-23  07/01/2008  . Tdap 07/01/2008  . Zoster 08/05/2009   Pertinent  Health Maintenance Due  Topic Date Due  . INFLUENZA VACCINE  02/08/2018  . PNA vac Low Risk Adult  Completed   Fall Risk  08/24/2017 03/14/2017 08/25/2016 03/17/2016 01/26/2016  Falls in the past year? No No Yes Yes Yes  Number falls in past yr: - - 2 or more 1 2 or more  Injury with Fall? - - Yes Yes Yes  Risk Factor Category  - - High Fall Risk High Fall Risk High Fall Risk  Risk for fall due to : - - - History of fall(s);Impaired mobility;Other (Comment) -  Risk for fall due to: Comment - - - related to effects of palsy -  Follow up - - - Falls prevention discussed Falls evaluation completed      Vitals:   02/28/18 1017  BP: 116/68  Pulse: 72  Resp: 20  Temp:  (!) 96.8 F (36 C)  TempSrc: Oral  SpO2: 96%  Weight: 146 lb 3.2 oz (66.3 kg)  Height: 5\' 6"  (1.676 m)   Body mass index is 23.6 kg/m.  Physical Exam  GENERAL APPEARANCE: Well nourished. In no acute distress. Normal body habitus SKIN:  Skin is warm and dry. MOUTH and THROAT: Lips are without lesions. Oral mucosa is moist and without lesions. Tongue is normal in shape, size, and color and without lesions RESPIRATORY: Breathing is even & unlabored, BS CTAB CARDIAC: RRR, no murmur,no extra heart sounds, no edema GI: Abdomen soft, normal BS, no masses, no tenderness EXTREMITIES:  Able to move X 4 extremities, BLE weakness PSYCHIATRIC: Alert to self, disoriented to time and place. Affect and behavior are appropriate   Labs reviewed: Recent Labs    01/08/18 1029 01/09/18 0556 01/10/18 0545 01/22/18  NA 145 146* 143 139  K 3.7 3.8 3.6 3.8  CL 117* 116* 114*  --   CO2 17* 24 23  --   GLUCOSE 111* 86 99  --   BUN 31* 29* 26* 20  CREATININE 1.92* 1.70* 1.50* 1.3  CALCIUM 9.3 8.9 8.6*  --    Recent Labs    01/08/18 1029  AST 14*  ALT <5  ALKPHOS 65  BILITOT 0.5  PROT 7.3  ALBUMIN 3.7   Recent Labs    09/20/17 1358  01/08/18 1029 01/09/18 0556 01/10/18 0545 01/22/18  WBC 6.1   < > 6.7 5.4 4.4 6.8  NEUTROABS 4.7  --   --   --   --  6  HGB 9.3*   < > 8.9* 7.6* 7.9* 9.0*  HCT 28.9*   < > 27.1* 24.1* 24.7* 27*  MCV 90.9   < > 90.3 91.6 91.1  --   PLT 169   < > 235 208 211 246   < > = values in this interval not displayed.   Lab Results  Component Value Date   TSH 0.856 06/13/2016    Lab Results  Component Value Date   CHOL 180 06/25/2015   HDL 81 06/25/2015   LDLCALC 88 06/25/2015   TRIG 56 06/25/2015   CHOLHDL 2.2 06/25/2015    Assessment/Plan  1. Aspiration pneumonia of right lower lobe, unspecified aspiration pneumonia type ([place]) -has completed antibiotics, continue guaifenesin PSE ER 600-60 mg 1 tab nightly as needed for cough   2. Parkinson's  disease ([place]) -stable, continue carbidopa-levodopa 25-100 mg take 1 1/2 tab TID   3. History of DVT (deep vein thrombosis) -no SOB,  continue Eliquis 5 mg 1 tab twice a day   4. Essential hypertension, benign -well-controlled, continue lisinopril HCTZ 10-12.5 mg 1 tab daily   5. Hyperlipidemia with target LDL less than 100 -continue simvastatin 20 mg 1 tab daily   6. Dementia due to Parkinson's disease with behavioral disturbance ([place]) -continue donepezil 10 mg 1 tab nightly, Quetiapine 50 mg Q HS   7. Chronic depression -stable, continue citalopram 20 mg 1 tab daily   I have filled out patient's discharge paperwork and written prescriptions.  Patient will receive home health PT, OT, ST, Nursing and CNA.  DME provided:  Harrel Lemon lift and wheelchair  Total discharge time: Less than 30 minutes  Discharge time involved coordination of the discharge process with Education officer, museum, nursing staff and therapy department. Medical justification for home health services/DME verified.   Durenda Age, NP [facility] [phone] (Monday-Friday 8:00 a.m. - 5:00 p.m.) [phone] (after hours)

## 2018-03-02 ENCOUNTER — Telehealth: Payer: Self-pay | Admitting: Family Medicine

## 2018-03-02 NOTE — Telephone Encounter (Signed)
Wife called & states she just brought him home from [facility] and they want him to take new meds and she wants to  Make sure they are ok with with Dr. Redmond School. Went over meds, and she realized they weren't new, they were just the generic names.  Pt has appt on Tueaday with Dr. Redmond School

## 2018-03-05 ENCOUNTER — Telehealth: Payer: Self-pay

## 2018-03-05 DIAGNOSIS — D649 Anemia, unspecified: Secondary | ICD-10-CM | POA: Diagnosis not present

## 2018-03-05 DIAGNOSIS — Z7901 Long term (current) use of anticoagulants: Secondary | ICD-10-CM | POA: Diagnosis not present

## 2018-03-05 DIAGNOSIS — J690 Pneumonitis due to inhalation of food and vomit: Secondary | ICD-10-CM | POA: Diagnosis not present

## 2018-03-05 DIAGNOSIS — Z923 Personal history of irradiation: Secondary | ICD-10-CM | POA: Diagnosis not present

## 2018-03-05 DIAGNOSIS — I129 Hypertensive chronic kidney disease with stage 1 through stage 4 chronic kidney disease, or unspecified chronic kidney disease: Secondary | ICD-10-CM | POA: Diagnosis not present

## 2018-03-05 DIAGNOSIS — F339 Major depressive disorder, recurrent, unspecified: Secondary | ICD-10-CM | POA: Diagnosis not present

## 2018-03-05 DIAGNOSIS — R1312 Dysphagia, oropharyngeal phase: Secondary | ICD-10-CM | POA: Diagnosis not present

## 2018-03-05 DIAGNOSIS — Z79899 Other long term (current) drug therapy: Secondary | ICD-10-CM | POA: Diagnosis not present

## 2018-03-05 DIAGNOSIS — Z86718 Personal history of other venous thrombosis and embolism: Secondary | ICD-10-CM | POA: Diagnosis not present

## 2018-03-05 DIAGNOSIS — G231 Progressive supranuclear ophthalmoplegia [Steele-Richardson-Olszewski]: Secondary | ICD-10-CM | POA: Diagnosis not present

## 2018-03-05 DIAGNOSIS — Z8551 Personal history of malignant neoplasm of bladder: Secondary | ICD-10-CM | POA: Diagnosis not present

## 2018-03-05 DIAGNOSIS — M1990 Unspecified osteoarthritis, unspecified site: Secondary | ICD-10-CM | POA: Diagnosis not present

## 2018-03-05 DIAGNOSIS — Z8546 Personal history of malignant neoplasm of prostate: Secondary | ICD-10-CM | POA: Diagnosis not present

## 2018-03-05 DIAGNOSIS — G20 Parkinson's disease: Secondary | ICD-10-CM | POA: Diagnosis not present

## 2018-03-05 DIAGNOSIS — F0281 Dementia in other diseases classified elsewhere with behavioral disturbance: Secondary | ICD-10-CM | POA: Diagnosis not present

## 2018-03-05 DIAGNOSIS — N183 Chronic kidney disease, stage 3 (moderate): Secondary | ICD-10-CM | POA: Diagnosis not present

## 2018-03-05 NOTE — Telephone Encounter (Signed)
ok 

## 2018-03-05 NOTE — Telephone Encounter (Signed)
Verdis Frederickson called from [facility] to get verbal orders for nursing and home health aide. Nursing is  2 week 2, 1 week 7. Aide would be 2 week 4. Please advise    Verdis Frederickson can be contacted at [phone]

## 2018-03-05 NOTE — Telephone Encounter (Signed)
Done KH 

## 2018-03-06 ENCOUNTER — Telehealth: Payer: Self-pay

## 2018-03-06 ENCOUNTER — Ambulatory Visit: Payer: Medicare Other | Admitting: Family Medicine

## 2018-03-06 DIAGNOSIS — F0281 Dementia in other diseases classified elsewhere with behavioral disturbance: Secondary | ICD-10-CM | POA: Diagnosis not present

## 2018-03-06 DIAGNOSIS — I129 Hypertensive chronic kidney disease with stage 1 through stage 4 chronic kidney disease, or unspecified chronic kidney disease: Secondary | ICD-10-CM | POA: Diagnosis not present

## 2018-03-06 DIAGNOSIS — G20 Parkinson's disease: Secondary | ICD-10-CM | POA: Diagnosis not present

## 2018-03-06 DIAGNOSIS — J690 Pneumonitis due to inhalation of food and vomit: Secondary | ICD-10-CM | POA: Diagnosis not present

## 2018-03-06 DIAGNOSIS — N183 Chronic kidney disease, stage 3 (moderate): Secondary | ICD-10-CM | POA: Diagnosis not present

## 2018-03-06 DIAGNOSIS — R1312 Dysphagia, oropharyngeal phase: Secondary | ICD-10-CM | POA: Diagnosis not present

## 2018-03-06 NOTE — Telephone Encounter (Signed)
Shirlee Latch with [facility] (Speech therapy) stated she seen patient today for evaluation and she needs verbal to see patient 1 week 1, 2 week 2, 1 week 2. Please advise.   Bethena Roys can be reached at the number below and she can take a verbal on the Voicemail to avoid the back and forth. [phone]

## 2018-03-06 NOTE — Telephone Encounter (Signed)
ok 

## 2018-03-07 ENCOUNTER — Encounter: Payer: Self-pay | Admitting: Family Medicine

## 2018-03-07 ENCOUNTER — Ambulatory Visit (INDEPENDENT_AMBULATORY_CARE_PROVIDER_SITE_OTHER): Payer: Medicare Other | Admitting: Family Medicine

## 2018-03-07 VITALS — BP 123/76 | HR 89 | Temp 97.5°F

## 2018-03-07 DIAGNOSIS — I824Y2 Acute embolism and thrombosis of unspecified deep veins of left proximal lower extremity: Secondary | ICD-10-CM

## 2018-03-07 DIAGNOSIS — G20 Parkinson's disease: Secondary | ICD-10-CM

## 2018-03-07 DIAGNOSIS — F0281 Dementia in other diseases classified elsewhere with behavioral disturbance: Secondary | ICD-10-CM | POA: Diagnosis not present

## 2018-03-07 DIAGNOSIS — G231 Progressive supranuclear ophthalmoplegia [Steele-Richardson-Olszewski]: Secondary | ICD-10-CM

## 2018-03-07 DIAGNOSIS — N183 Chronic kidney disease, stage 3 unspecified: Secondary | ICD-10-CM

## 2018-03-07 DIAGNOSIS — D631 Anemia in chronic kidney disease: Secondary | ICD-10-CM | POA: Diagnosis not present

## 2018-03-07 DIAGNOSIS — F02818 Dementia in other diseases classified elsewhere, unspecified severity, with other behavioral disturbance: Secondary | ICD-10-CM

## 2018-03-07 NOTE — Progress Notes (Signed)
   Subjective:    Patient ID: Brian Montgomery, male    DOB: 1935/09/20, 82 y.o.   MRN: [id number]  HPI He is here for follow-up visit after being discharged from [facility] care. Prior to this he was in the hospital and being treated for aspiration pneumonia.  He also has an underlying history of DVT and presently is on Xarelto.  He is followed by neurology for progressive supranuclear palsy as well as Parkinson's type symptoms.  In the hospital he was found to have worsening of his underlying CKD as well as some issue with pneumonia.  Presently he is being cared for at home.  PT, OT, ST and CNA involvement is now occurring.  His caregivers do recognize that at some point, placing him in a nursing facility will be needed.  Review of Systems     Objective:   Physical Exam Alert and in no distress.  Slightly masked face  is noted.  Tympanic membranes and canals are normal. Pharyngeal area is normal. Neck is supple without adenopathy or thyromegaly. Cardiac exam shows a regular sinus rhythm without murmurs or gallops. Lungs are clear to auscultation.        Assessment & Plan:  PSP (progressive supranuclear palsy) (HCC)  Acute deep vein thrombosis (DVT) of proximal vein of left lower extremity (HCC)  Dementia associated with other underlying disease with behavioral disturbance  CKD (chronic kidney disease) stage 3, GFR 30-59 ml/min (HCC) - Plan: Comprehensive metabolic panel  Anemia due to stage 3 chronic kidney disease ([place]) - Plan: CBC with Differential/Platelet I discussed his overall care with his daughter and wife.  They seem to have a good understanding but recognize that his care is becoming harder and harder for them to manage.  He will follow-up with neurology in the near future.  They will keep in touch with me concerning his overall care. Over 25 minutes, greater than 50% spent in counseling and coordination of care.

## 2018-03-07 NOTE — Telephone Encounter (Signed)
Brian Montgomery has been given verbals per provider.

## 2018-03-08 ENCOUNTER — Telehealth: Payer: Self-pay

## 2018-03-08 DIAGNOSIS — I129 Hypertensive chronic kidney disease with stage 1 through stage 4 chronic kidney disease, or unspecified chronic kidney disease: Secondary | ICD-10-CM | POA: Diagnosis not present

## 2018-03-08 DIAGNOSIS — J690 Pneumonitis due to inhalation of food and vomit: Secondary | ICD-10-CM | POA: Diagnosis not present

## 2018-03-08 DIAGNOSIS — N183 Chronic kidney disease, stage 3 (moderate): Secondary | ICD-10-CM | POA: Diagnosis not present

## 2018-03-08 DIAGNOSIS — G20 Parkinson's disease: Secondary | ICD-10-CM | POA: Diagnosis not present

## 2018-03-08 DIAGNOSIS — R1312 Dysphagia, oropharyngeal phase: Secondary | ICD-10-CM | POA: Diagnosis not present

## 2018-03-08 DIAGNOSIS — F0281 Dementia in other diseases classified elsewhere with behavioral disturbance: Secondary | ICD-10-CM | POA: Diagnosis not present

## 2018-03-08 LAB — COMPREHENSIVE METABOLIC PANEL
ALBUMIN: 3.5 g/dL (ref 3.5–4.7)
ALK PHOS: 95 IU/L (ref 39–117)
ALT: 25 IU/L (ref 0–44)
AST: 28 IU/L (ref 0–40)
Albumin/Globulin Ratio: 0.8 — ABNORMAL LOW (ref 1.2–2.2)
BILIRUBIN TOTAL: 0.2 mg/dL (ref 0.0–1.2)
BUN / CREAT RATIO: 22 (ref 10–24)
BUN: 51 mg/dL — ABNORMAL HIGH (ref 8–27)
CO2: 22 mmol/L (ref 20–29)
CREATININE: 2.27 mg/dL — AB (ref 0.76–1.27)
Calcium: 9.6 mg/dL (ref 8.6–10.2)
Chloride: 107 mmol/L — ABNORMAL HIGH (ref 96–106)
GFR calc non Af Amer: 26 mL/min/{1.73_m2} — ABNORMAL LOW (ref >59)
GFR, EST AFRICAN AMERICAN: 30 mL/min/{1.73_m2} — AB (ref >59)
Globulin, Total: 4.4 g/dL (ref 1.5–4.5)
Glucose: 132 mg/dL — ABNORMAL HIGH (ref 65–99)
Potassium: 4.4 mmol/L (ref 3.5–5.2)
SODIUM: 144 mmol/L (ref 134–144)
TOTAL PROTEIN: 7.9 g/dL (ref 6.0–8.5)

## 2018-03-08 LAB — CBC WITH DIFFERENTIAL/PLATELET
Basophils Absolute: 0 10*3/uL (ref 0.0–0.2)
Basos: 1 %
EOS (ABSOLUTE): 0.1 10*3/uL (ref 0.0–0.4)
Eos: 1 %
HEMOGLOBIN: 8.8 g/dL — AB (ref 13.0–17.7)
Hematocrit: 28.9 % — ABNORMAL LOW (ref 37.5–51.0)
Immature Grans (Abs): 0 10*3/uL (ref 0.0–0.1)
Immature Granulocytes: 1 %
LYMPHS ABS: 0.9 10*3/uL (ref 0.7–3.1)
Lymphs: 12 %
MCH: 26.7 pg (ref 26.6–33.0)
MCHC: 30.4 g/dL — AB (ref 31.5–35.7)
MCV: 88 fL (ref 79–97)
MONOCYTES: 5 %
Monocytes Absolute: 0.4 10*3/uL (ref 0.1–0.9)
NEUTROS ABS: 5.8 10*3/uL (ref 1.4–7.0)
Neutrophils: 80 %
Platelets: 409 10*3/uL (ref 150–450)
RBC: 3.3 x10E6/uL — ABNORMAL LOW (ref 4.14–5.80)
RDW: 13.5 % (ref 12.3–15.4)
WBC: 7.3 10*3/uL (ref 3.4–10.8)

## 2018-03-08 NOTE — Telephone Encounter (Signed)
ok 

## 2018-03-08 NOTE — Telephone Encounter (Signed)
Amy called from [facility] to request verbal orders for Physical Therapy. 1 week 1, 2 week 3 for bed mobility, tranfers and hoyer lift training.   Amy can be contacted at the number below. Message can be left on VM with verbal orders.   [phone]

## 2018-03-09 DIAGNOSIS — F0281 Dementia in other diseases classified elsewhere with behavioral disturbance: Secondary | ICD-10-CM | POA: Diagnosis not present

## 2018-03-09 DIAGNOSIS — G20 Parkinson's disease: Secondary | ICD-10-CM | POA: Diagnosis not present

## 2018-03-09 DIAGNOSIS — J690 Pneumonitis due to inhalation of food and vomit: Secondary | ICD-10-CM | POA: Diagnosis not present

## 2018-03-09 DIAGNOSIS — I129 Hypertensive chronic kidney disease with stage 1 through stage 4 chronic kidney disease, or unspecified chronic kidney disease: Secondary | ICD-10-CM | POA: Diagnosis not present

## 2018-03-09 DIAGNOSIS — N183 Chronic kidney disease, stage 3 (moderate): Secondary | ICD-10-CM | POA: Diagnosis not present

## 2018-03-09 DIAGNOSIS — R1312 Dysphagia, oropharyngeal phase: Secondary | ICD-10-CM | POA: Diagnosis not present

## 2018-03-09 NOTE — Telephone Encounter (Signed)
Brian Montgomery was made aware [facility]

## 2018-03-12 DIAGNOSIS — R1312 Dysphagia, oropharyngeal phase: Secondary | ICD-10-CM | POA: Diagnosis not present

## 2018-03-12 DIAGNOSIS — N183 Chronic kidney disease, stage 3 (moderate): Secondary | ICD-10-CM | POA: Diagnosis not present

## 2018-03-12 DIAGNOSIS — G20 Parkinson's disease: Secondary | ICD-10-CM | POA: Diagnosis not present

## 2018-03-12 DIAGNOSIS — F0281 Dementia in other diseases classified elsewhere with behavioral disturbance: Secondary | ICD-10-CM | POA: Diagnosis not present

## 2018-03-12 DIAGNOSIS — J690 Pneumonitis due to inhalation of food and vomit: Secondary | ICD-10-CM | POA: Diagnosis not present

## 2018-03-12 DIAGNOSIS — I129 Hypertensive chronic kidney disease with stage 1 through stage 4 chronic kidney disease, or unspecified chronic kidney disease: Secondary | ICD-10-CM | POA: Diagnosis not present

## 2018-03-13 DIAGNOSIS — N183 Chronic kidney disease, stage 3 (moderate): Secondary | ICD-10-CM | POA: Diagnosis not present

## 2018-03-13 DIAGNOSIS — I129 Hypertensive chronic kidney disease with stage 1 through stage 4 chronic kidney disease, or unspecified chronic kidney disease: Secondary | ICD-10-CM | POA: Diagnosis not present

## 2018-03-13 DIAGNOSIS — J690 Pneumonitis due to inhalation of food and vomit: Secondary | ICD-10-CM | POA: Diagnosis not present

## 2018-03-13 DIAGNOSIS — R1312 Dysphagia, oropharyngeal phase: Secondary | ICD-10-CM | POA: Diagnosis not present

## 2018-03-13 DIAGNOSIS — F0281 Dementia in other diseases classified elsewhere with behavioral disturbance: Secondary | ICD-10-CM | POA: Diagnosis not present

## 2018-03-13 DIAGNOSIS — G20 Parkinson's disease: Secondary | ICD-10-CM | POA: Diagnosis not present

## 2018-03-14 DIAGNOSIS — G20 Parkinson's disease: Secondary | ICD-10-CM | POA: Diagnosis not present

## 2018-03-14 DIAGNOSIS — J690 Pneumonitis due to inhalation of food and vomit: Secondary | ICD-10-CM | POA: Diagnosis not present

## 2018-03-14 DIAGNOSIS — R1312 Dysphagia, oropharyngeal phase: Secondary | ICD-10-CM | POA: Diagnosis not present

## 2018-03-14 DIAGNOSIS — N183 Chronic kidney disease, stage 3 (moderate): Secondary | ICD-10-CM | POA: Diagnosis not present

## 2018-03-14 DIAGNOSIS — F0281 Dementia in other diseases classified elsewhere with behavioral disturbance: Secondary | ICD-10-CM | POA: Diagnosis not present

## 2018-03-14 DIAGNOSIS — I129 Hypertensive chronic kidney disease with stage 1 through stage 4 chronic kidney disease, or unspecified chronic kidney disease: Secondary | ICD-10-CM | POA: Diagnosis not present

## 2018-03-15 DIAGNOSIS — J690 Pneumonitis due to inhalation of food and vomit: Secondary | ICD-10-CM | POA: Diagnosis not present

## 2018-03-15 DIAGNOSIS — R1312 Dysphagia, oropharyngeal phase: Secondary | ICD-10-CM | POA: Diagnosis not present

## 2018-03-15 DIAGNOSIS — N183 Chronic kidney disease, stage 3 (moderate): Secondary | ICD-10-CM | POA: Diagnosis not present

## 2018-03-15 DIAGNOSIS — G20 Parkinson's disease: Secondary | ICD-10-CM | POA: Diagnosis not present

## 2018-03-15 DIAGNOSIS — I129 Hypertensive chronic kidney disease with stage 1 through stage 4 chronic kidney disease, or unspecified chronic kidney disease: Secondary | ICD-10-CM | POA: Diagnosis not present

## 2018-03-15 DIAGNOSIS — F0281 Dementia in other diseases classified elsewhere with behavioral disturbance: Secondary | ICD-10-CM | POA: Diagnosis not present

## 2018-03-16 DIAGNOSIS — J690 Pneumonitis due to inhalation of food and vomit: Secondary | ICD-10-CM | POA: Diagnosis not present

## 2018-03-16 DIAGNOSIS — N183 Chronic kidney disease, stage 3 (moderate): Secondary | ICD-10-CM | POA: Diagnosis not present

## 2018-03-16 DIAGNOSIS — F0281 Dementia in other diseases classified elsewhere with behavioral disturbance: Secondary | ICD-10-CM | POA: Diagnosis not present

## 2018-03-16 DIAGNOSIS — R1312 Dysphagia, oropharyngeal phase: Secondary | ICD-10-CM | POA: Diagnosis not present

## 2018-03-16 DIAGNOSIS — G20 Parkinson's disease: Secondary | ICD-10-CM | POA: Diagnosis not present

## 2018-03-16 DIAGNOSIS — I129 Hypertensive chronic kidney disease with stage 1 through stage 4 chronic kidney disease, or unspecified chronic kidney disease: Secondary | ICD-10-CM | POA: Diagnosis not present

## 2018-03-19 DIAGNOSIS — R1312 Dysphagia, oropharyngeal phase: Secondary | ICD-10-CM | POA: Diagnosis not present

## 2018-03-19 DIAGNOSIS — N183 Chronic kidney disease, stage 3 (moderate): Secondary | ICD-10-CM | POA: Diagnosis not present

## 2018-03-19 DIAGNOSIS — G20 Parkinson's disease: Secondary | ICD-10-CM | POA: Diagnosis not present

## 2018-03-19 DIAGNOSIS — J690 Pneumonitis due to inhalation of food and vomit: Secondary | ICD-10-CM | POA: Diagnosis not present

## 2018-03-19 DIAGNOSIS — F0281 Dementia in other diseases classified elsewhere with behavioral disturbance: Secondary | ICD-10-CM | POA: Diagnosis not present

## 2018-03-19 DIAGNOSIS — I129 Hypertensive chronic kidney disease with stage 1 through stage 4 chronic kidney disease, or unspecified chronic kidney disease: Secondary | ICD-10-CM | POA: Diagnosis not present

## 2018-03-20 ENCOUNTER — Telehealth: Payer: Self-pay | Admitting: Family Medicine

## 2018-03-20 ENCOUNTER — Telehealth: Payer: Self-pay

## 2018-03-20 DIAGNOSIS — N183 Chronic kidney disease, stage 3 (moderate): Secondary | ICD-10-CM | POA: Diagnosis not present

## 2018-03-20 DIAGNOSIS — R1312 Dysphagia, oropharyngeal phase: Secondary | ICD-10-CM | POA: Diagnosis not present

## 2018-03-20 DIAGNOSIS — J690 Pneumonitis due to inhalation of food and vomit: Secondary | ICD-10-CM | POA: Diagnosis not present

## 2018-03-20 DIAGNOSIS — G20 Parkinson's disease: Secondary | ICD-10-CM | POA: Diagnosis not present

## 2018-03-20 DIAGNOSIS — F0281 Dementia in other diseases classified elsewhere with behavioral disturbance: Secondary | ICD-10-CM | POA: Diagnosis not present

## 2018-03-20 DIAGNOSIS — I129 Hypertensive chronic kidney disease with stage 1 through stage 4 chronic kidney disease, or unspecified chronic kidney disease: Secondary | ICD-10-CM | POA: Diagnosis not present

## 2018-03-20 NOTE — Telephone Encounter (Signed)
Pt daughter called and states that the nurse from [facility] is wanting to get the labs that you wanted to recheck, states it is hard to get him in out of the house, and she can do them at home, if you could fax the orders to [facility], at [phone] and any questions you can call his wife at [phone]

## 2018-03-20 NOTE — Telephone Encounter (Signed)
Faxed over lab order to [facility]

## 2018-03-21 DIAGNOSIS — J690 Pneumonitis due to inhalation of food and vomit: Secondary | ICD-10-CM | POA: Diagnosis not present

## 2018-03-21 DIAGNOSIS — G20 Parkinson's disease: Secondary | ICD-10-CM | POA: Diagnosis not present

## 2018-03-21 DIAGNOSIS — N183 Chronic kidney disease, stage 3 (moderate): Secondary | ICD-10-CM | POA: Diagnosis not present

## 2018-03-21 DIAGNOSIS — F0281 Dementia in other diseases classified elsewhere with behavioral disturbance: Secondary | ICD-10-CM | POA: Diagnosis not present

## 2018-03-21 DIAGNOSIS — R1312 Dysphagia, oropharyngeal phase: Secondary | ICD-10-CM | POA: Diagnosis not present

## 2018-03-21 DIAGNOSIS — I129 Hypertensive chronic kidney disease with stage 1 through stage 4 chronic kidney disease, or unspecified chronic kidney disease: Secondary | ICD-10-CM | POA: Diagnosis not present

## 2018-03-21 DIAGNOSIS — Z79899 Other long term (current) drug therapy: Secondary | ICD-10-CM | POA: Diagnosis not present

## 2018-03-22 ENCOUNTER — Encounter: Payer: Self-pay | Admitting: Family Medicine

## 2018-03-22 ENCOUNTER — Telehealth: Payer: Self-pay

## 2018-03-22 DIAGNOSIS — F0281 Dementia in other diseases classified elsewhere with behavioral disturbance: Secondary | ICD-10-CM | POA: Diagnosis not present

## 2018-03-22 DIAGNOSIS — I129 Hypertensive chronic kidney disease with stage 1 through stage 4 chronic kidney disease, or unspecified chronic kidney disease: Secondary | ICD-10-CM | POA: Diagnosis not present

## 2018-03-22 DIAGNOSIS — G20 Parkinson's disease: Secondary | ICD-10-CM | POA: Diagnosis not present

## 2018-03-22 DIAGNOSIS — J690 Pneumonitis due to inhalation of food and vomit: Secondary | ICD-10-CM | POA: Diagnosis not present

## 2018-03-22 DIAGNOSIS — R1312 Dysphagia, oropharyngeal phase: Secondary | ICD-10-CM | POA: Diagnosis not present

## 2018-03-22 DIAGNOSIS — N183 Chronic kidney disease, stage 3 (moderate): Secondary | ICD-10-CM | POA: Diagnosis not present

## 2018-03-22 NOTE — Progress Notes (Signed)
His kidney function is worsening.  My concern is hydration.  I will relay the message to increase his hydration and recheck the numbers next week.

## 2018-03-22 NOTE — Telephone Encounter (Signed)
Pt daughter was called to advise her that father needs to increase fluids and have repeat labs on Tuesday. Faxed over order 03-22-18. Brian Montgomery

## 2018-03-23 DIAGNOSIS — N183 Chronic kidney disease, stage 3 (moderate): Secondary | ICD-10-CM | POA: Diagnosis not present

## 2018-03-23 DIAGNOSIS — J690 Pneumonitis due to inhalation of food and vomit: Secondary | ICD-10-CM | POA: Diagnosis not present

## 2018-03-23 DIAGNOSIS — I129 Hypertensive chronic kidney disease with stage 1 through stage 4 chronic kidney disease, or unspecified chronic kidney disease: Secondary | ICD-10-CM | POA: Diagnosis not present

## 2018-03-23 DIAGNOSIS — R1312 Dysphagia, oropharyngeal phase: Secondary | ICD-10-CM | POA: Diagnosis not present

## 2018-03-23 DIAGNOSIS — G20 Parkinson's disease: Secondary | ICD-10-CM | POA: Diagnosis not present

## 2018-03-23 DIAGNOSIS — F0281 Dementia in other diseases classified elsewhere with behavioral disturbance: Secondary | ICD-10-CM | POA: Diagnosis not present

## 2018-03-27 ENCOUNTER — Encounter: Payer: Self-pay | Admitting: Family Medicine

## 2018-03-27 DIAGNOSIS — R1312 Dysphagia, oropharyngeal phase: Secondary | ICD-10-CM | POA: Diagnosis not present

## 2018-03-27 DIAGNOSIS — G20 Parkinson's disease: Secondary | ICD-10-CM | POA: Diagnosis not present

## 2018-03-27 DIAGNOSIS — F0281 Dementia in other diseases classified elsewhere with behavioral disturbance: Secondary | ICD-10-CM | POA: Diagnosis not present

## 2018-03-27 DIAGNOSIS — D649 Anemia, unspecified: Secondary | ICD-10-CM | POA: Diagnosis not present

## 2018-03-27 DIAGNOSIS — J690 Pneumonitis due to inhalation of food and vomit: Secondary | ICD-10-CM | POA: Diagnosis not present

## 2018-03-27 DIAGNOSIS — I129 Hypertensive chronic kidney disease with stage 1 through stage 4 chronic kidney disease, or unspecified chronic kidney disease: Secondary | ICD-10-CM | POA: Diagnosis not present

## 2018-03-27 DIAGNOSIS — N183 Chronic kidney disease, stage 3 (moderate): Secondary | ICD-10-CM | POA: Diagnosis not present

## 2018-03-28 DIAGNOSIS — G20 Parkinson's disease: Secondary | ICD-10-CM | POA: Diagnosis not present

## 2018-03-28 DIAGNOSIS — I129 Hypertensive chronic kidney disease with stage 1 through stage 4 chronic kidney disease, or unspecified chronic kidney disease: Secondary | ICD-10-CM | POA: Diagnosis not present

## 2018-03-28 DIAGNOSIS — R1312 Dysphagia, oropharyngeal phase: Secondary | ICD-10-CM | POA: Diagnosis not present

## 2018-03-28 DIAGNOSIS — J690 Pneumonitis due to inhalation of food and vomit: Secondary | ICD-10-CM | POA: Diagnosis not present

## 2018-03-28 DIAGNOSIS — N183 Chronic kidney disease, stage 3 (moderate): Secondary | ICD-10-CM | POA: Diagnosis not present

## 2018-03-28 DIAGNOSIS — F0281 Dementia in other diseases classified elsewhere with behavioral disturbance: Secondary | ICD-10-CM | POA: Diagnosis not present

## 2018-03-29 DIAGNOSIS — G20 Parkinson's disease: Secondary | ICD-10-CM | POA: Diagnosis not present

## 2018-03-29 DIAGNOSIS — N183 Chronic kidney disease, stage 3 (moderate): Secondary | ICD-10-CM | POA: Diagnosis not present

## 2018-03-29 DIAGNOSIS — R1312 Dysphagia, oropharyngeal phase: Secondary | ICD-10-CM | POA: Diagnosis not present

## 2018-03-29 DIAGNOSIS — I129 Hypertensive chronic kidney disease with stage 1 through stage 4 chronic kidney disease, or unspecified chronic kidney disease: Secondary | ICD-10-CM | POA: Diagnosis not present

## 2018-03-29 DIAGNOSIS — J690 Pneumonitis due to inhalation of food and vomit: Secondary | ICD-10-CM | POA: Diagnosis not present

## 2018-03-29 DIAGNOSIS — F0281 Dementia in other diseases classified elsewhere with behavioral disturbance: Secondary | ICD-10-CM | POA: Diagnosis not present

## 2018-03-30 ENCOUNTER — Encounter: Payer: Self-pay | Admitting: Family Medicine

## 2018-03-30 ENCOUNTER — Other Ambulatory Visit: Payer: Self-pay

## 2018-03-30 ENCOUNTER — Encounter (HOSPITAL_COMMUNITY): Payer: Self-pay

## 2018-03-30 ENCOUNTER — Inpatient Hospital Stay (HOSPITAL_COMMUNITY)
Admission: EM | Admit: 2018-03-30 | Discharge: 2018-04-01 | DRG: 641 | Disposition: A | Discharge status: Hospice | Payer: Medicare Other | Attending: Internal Medicine | Admitting: Internal Medicine

## 2018-03-30 ENCOUNTER — Telehealth: Payer: Self-pay

## 2018-03-30 DIAGNOSIS — Z8546 Personal history of malignant neoplasm of prostate: Secondary | ICD-10-CM | POA: Diagnosis not present

## 2018-03-30 DIAGNOSIS — R5381 Other malaise: Secondary | ICD-10-CM | POA: Diagnosis present

## 2018-03-30 DIAGNOSIS — E861 Hypovolemia: Secondary | ICD-10-CM | POA: Diagnosis present

## 2018-03-30 DIAGNOSIS — M1990 Unspecified osteoarthritis, unspecified site: Secondary | ICD-10-CM | POA: Diagnosis present

## 2018-03-30 DIAGNOSIS — N183 Chronic kidney disease, stage 3 (moderate): Secondary | ICD-10-CM | POA: Diagnosis present

## 2018-03-30 DIAGNOSIS — Z7901 Long term (current) use of anticoagulants: Secondary | ICD-10-CM | POA: Diagnosis not present

## 2018-03-30 DIAGNOSIS — Z86718 Personal history of other venous thrombosis and embolism: Secondary | ICD-10-CM | POA: Diagnosis not present

## 2018-03-30 DIAGNOSIS — N179 Acute kidney failure, unspecified: Secondary | ICD-10-CM | POA: Diagnosis present

## 2018-03-30 DIAGNOSIS — N390 Urinary tract infection, site not specified: Secondary | ICD-10-CM

## 2018-03-30 DIAGNOSIS — Z923 Personal history of irradiation: Secondary | ICD-10-CM

## 2018-03-30 DIAGNOSIS — F0280 Dementia in other diseases classified elsewhere without behavioral disturbance: Secondary | ICD-10-CM | POA: Diagnosis present

## 2018-03-30 DIAGNOSIS — R269 Unspecified abnormalities of gait and mobility: Secondary | ICD-10-CM | POA: Diagnosis present

## 2018-03-30 DIAGNOSIS — Z515 Encounter for palliative care: Secondary | ICD-10-CM | POA: Diagnosis not present

## 2018-03-30 DIAGNOSIS — R1310 Dysphagia, unspecified: Secondary | ICD-10-CM | POA: Diagnosis present

## 2018-03-30 DIAGNOSIS — I10 Essential (primary) hypertension: Secondary | ICD-10-CM | POA: Diagnosis not present

## 2018-03-30 DIAGNOSIS — I959 Hypotension, unspecified: Secondary | ICD-10-CM | POA: Diagnosis not present

## 2018-03-30 DIAGNOSIS — G20 Parkinson's disease: Secondary | ICD-10-CM | POA: Diagnosis present

## 2018-03-30 DIAGNOSIS — Z8551 Personal history of malignant neoplasm of bladder: Secondary | ICD-10-CM | POA: Diagnosis not present

## 2018-03-30 DIAGNOSIS — G231 Progressive supranuclear ophthalmoplegia [Steele-Richardson-Olszewski]: Secondary | ICD-10-CM | POA: Diagnosis present

## 2018-03-30 DIAGNOSIS — Z23 Encounter for immunization: Secondary | ICD-10-CM | POA: Diagnosis not present

## 2018-03-30 DIAGNOSIS — R627 Adult failure to thrive: Secondary | ICD-10-CM | POA: Diagnosis present

## 2018-03-30 DIAGNOSIS — E875 Hyperkalemia: Secondary | ICD-10-CM | POA: Diagnosis present

## 2018-03-30 DIAGNOSIS — E860 Dehydration: Secondary | ICD-10-CM | POA: Diagnosis not present

## 2018-03-30 DIAGNOSIS — Z87891 Personal history of nicotine dependence: Secondary | ICD-10-CM

## 2018-03-30 DIAGNOSIS — D631 Anemia in chronic kidney disease: Secondary | ICD-10-CM | POA: Diagnosis present

## 2018-03-30 DIAGNOSIS — Z7189 Other specified counseling: Secondary | ICD-10-CM | POA: Diagnosis not present

## 2018-03-30 DIAGNOSIS — Z833 Family history of diabetes mellitus: Secondary | ICD-10-CM

## 2018-03-30 DIAGNOSIS — I129 Hypertensive chronic kidney disease with stage 1 through stage 4 chronic kidney disease, or unspecified chronic kidney disease: Secondary | ICD-10-CM | POA: Diagnosis present

## 2018-03-30 DIAGNOSIS — Z841 Family history of disorders of kidney and ureter: Secondary | ICD-10-CM

## 2018-03-30 DIAGNOSIS — R404 Transient alteration of awareness: Secondary | ICD-10-CM | POA: Diagnosis not present

## 2018-03-30 DIAGNOSIS — R4182 Altered mental status, unspecified: Secondary | ICD-10-CM | POA: Diagnosis not present

## 2018-03-30 DIAGNOSIS — E870 Hyperosmolality and hypernatremia: Secondary | ICD-10-CM | POA: Diagnosis not present

## 2018-03-30 DIAGNOSIS — C679 Malignant neoplasm of bladder, unspecified: Secondary | ICD-10-CM | POA: Diagnosis not present

## 2018-03-30 DIAGNOSIS — B961 Klebsiella pneumoniae [K. pneumoniae] as the cause of diseases classified elsewhere: Secondary | ICD-10-CM | POA: Diagnosis present

## 2018-03-30 DIAGNOSIS — Z9221 Personal history of antineoplastic chemotherapy: Secondary | ICD-10-CM

## 2018-03-30 DIAGNOSIS — R0902 Hypoxemia: Secondary | ICD-10-CM | POA: Diagnosis not present

## 2018-03-30 DIAGNOSIS — Z7401 Bed confinement status: Secondary | ICD-10-CM

## 2018-03-30 DIAGNOSIS — E871 Hypo-osmolality and hyponatremia: Secondary | ICD-10-CM | POA: Diagnosis present

## 2018-03-30 DIAGNOSIS — Z8673 Personal history of transient ischemic attack (TIA), and cerebral infarction without residual deficits: Secondary | ICD-10-CM

## 2018-03-30 DIAGNOSIS — N189 Chronic kidney disease, unspecified: Secondary | ICD-10-CM | POA: Diagnosis not present

## 2018-03-30 DIAGNOSIS — Z8249 Family history of ischemic heart disease and other diseases of the circulatory system: Secondary | ICD-10-CM

## 2018-03-30 DIAGNOSIS — E785 Hyperlipidemia, unspecified: Secondary | ICD-10-CM | POA: Diagnosis present

## 2018-03-30 DIAGNOSIS — Z66 Do not resuscitate: Secondary | ICD-10-CM | POA: Diagnosis present

## 2018-03-30 DIAGNOSIS — Z79899 Other long term (current) drug therapy: Secondary | ICD-10-CM

## 2018-03-30 DIAGNOSIS — N136 Pyonephrosis: Secondary | ICD-10-CM | POA: Diagnosis present

## 2018-03-30 LAB — CBC WITH DIFFERENTIAL/PLATELET
BASOS ABS: 0 10*3/uL (ref 0.0–0.1)
BASOS PCT: 0 %
EOS PCT: 1 %
Eosinophils Absolute: 0.1 10*3/uL (ref 0.0–0.7)
HCT: 29.4 % — ABNORMAL LOW (ref 39.0–52.0)
Hemoglobin: 9.1 g/dL — ABNORMAL LOW (ref 13.0–17.0)
Lymphocytes Relative: 15 %
Lymphs Abs: 1.2 10*3/uL (ref 0.7–4.0)
MCH: 26.7 pg (ref 26.0–34.0)
MCHC: 31 g/dL (ref 30.0–36.0)
MCV: 86.2 fL (ref 78.0–100.0)
MONO ABS: 0.6 10*3/uL (ref 0.1–1.0)
Monocytes Relative: 8 %
Neutro Abs: 6.1 10*3/uL (ref 1.7–7.7)
Neutrophils Relative %: 76 %
PLATELETS: 311 10*3/uL (ref 150–400)
RBC: 3.41 MIL/uL — ABNORMAL LOW (ref 4.22–5.81)
RDW: 17.3 % — AB (ref 11.5–15.5)
WBC: 8 10*3/uL (ref 4.0–10.5)

## 2018-03-30 LAB — URINALYSIS, ROUTINE W REFLEX MICROSCOPIC
Bilirubin Urine: NEGATIVE
GLUCOSE, UA: NEGATIVE mg/dL
KETONES UR: 5 mg/dL — AB
NITRITE: NEGATIVE
PH: 6 (ref 5.0–8.0)
Protein, ur: 100 mg/dL — AB
RBC / HPF: >50 RBC/hpf — ABNORMAL HIGH (ref 0–5)
Specific Gravity, Urine: 1.013 (ref 1.005–1.030)
WBC, UA: >50 WBC/hpf — ABNORMAL HIGH (ref 0–5)

## 2018-03-30 LAB — BASIC METABOLIC PANEL
ANION GAP: 10 (ref 5–15)
BUN: 134 mg/dL — ABNORMAL HIGH (ref 8–23)
CALCIUM: 10.1 mg/dL (ref 8.9–10.3)
CO2: 19 mmol/L — ABNORMAL LOW (ref 22–32)
Chloride: 123 mmol/L — ABNORMAL HIGH (ref 98–111)
Creatinine, Ser: 6.37 mg/dL — ABNORMAL HIGH (ref 0.61–1.24)
GFR calc non Af Amer: 7 mL/min — ABNORMAL LOW (ref >60)
GFR, EST AFRICAN AMERICAN: 9 mL/min — AB (ref >60)
Glucose, Bld: 106 mg/dL — ABNORMAL HIGH (ref 70–99)
Potassium: 5.6 mmol/L — ABNORMAL HIGH (ref 3.5–5.1)
Sodium: 152 mmol/L — ABNORMAL HIGH (ref 135–145)

## 2018-03-30 MED ORDER — CARBIDOPA-LEVODOPA 25-100 MG PO TABS
1.5000 | ORAL_TABLET | Freq: Three times a day (TID) | ORAL | Status: DC
  Administered 2018-03-30 – 2018-04-01 (×5): 1.5 via ORAL
  Filled 2018-03-30 (×5): qty 2

## 2018-03-30 MED ORDER — SODIUM CHLORIDE 0.9 % IV SOLN
1.0000 g | INTRAVENOUS | Status: DC
  Administered 2018-03-31 – 2018-04-01 (×2): 1 g via INTRAVENOUS
  Filled 2018-03-30 (×2): qty 1

## 2018-03-30 MED ORDER — SODIUM CHLORIDE 0.9 % IV SOLN
INTRAVENOUS | Status: DC
  Administered 2018-03-30: 18:00:00 via INTRAVENOUS

## 2018-03-30 MED ORDER — SODIUM CHLORIDE 0.9 % IV SOLN
INTRAVENOUS | Status: DC
  Administered 2018-03-30: 14:00:00 via INTRAVENOUS

## 2018-03-30 MED ORDER — SODIUM CHLORIDE 0.9 % IV BOLUS
500.0000 mL | Freq: Once | INTRAVENOUS | Status: AC
  Administered 2018-03-30: 500 mL via INTRAVENOUS

## 2018-03-30 MED ORDER — SODIUM CHLORIDE 0.9 % IV SOLN
1.0000 g | Freq: Once | INTRAVENOUS | Status: AC
  Administered 2018-03-30: 1 g via INTRAVENOUS
  Filled 2018-03-30: qty 10

## 2018-03-30 MED ORDER — INFLUENZA VAC SPLIT HIGH-DOSE 0.5 ML IM SUSY
0.5000 mL | PREFILLED_SYRINGE | INTRAMUSCULAR | Status: AC
  Administered 2018-03-30: 0.5 mL via INTRAMUSCULAR
  Filled 2018-03-30: qty 0.5

## 2018-03-30 MED ORDER — APIXABAN 2.5 MG PO TABS
2.5000 mg | ORAL_TABLET | Freq: Two times a day (BID) | ORAL | Status: DC
  Administered 2018-03-30 – 2018-04-01 (×4): 2.5 mg via ORAL
  Filled 2018-03-30 (×3): qty 1

## 2018-03-30 MED ORDER — QUETIAPINE FUMARATE 25 MG PO TABS
50.0000 mg | ORAL_TABLET | Freq: Every day | ORAL | Status: DC
  Administered 2018-03-30 – 2018-03-31 (×2): 50 mg via ORAL
  Filled 2018-03-30 (×2): qty 2

## 2018-03-30 NOTE — ED Notes (Signed)
ED TO INPATIENT HANDOFF REPORT  Name/Age/Gender Brian Montgomery 82 y.o. male  Code Status Code Status History    Date Active Date Inactive Code Status Order ID Comments User Context   01/11/2018 1124 01/12/2018 1536 DNR [id number]  Earlie Counts, NP Inpatient   01/08/2018 1401 01/11/2018 1124 Full Code [id number]  Doreatha Lew, MD ED   11/23/2017 1810 11/24/2017 1310 Full Code [id number]  Raynelle Bring, MD Inpatient   06/26/2017 1734 06/27/2017 1525 Full Code [id number]  Raynelle Bring, MD Inpatient   03/16/2017 1516 03/17/2017 1324 Full Code [id number]  Raynelle Bring, MD Inpatient   06/30/2016 2016 07/03/2016 1440 Full Code [id number]  Elwin Mocha, MD Inpatient   06/13/2016 1934 06/17/2016 1930 Full Code [id number]  Eugenie Filler, MD Inpatient   01/21/2016 2024 01/22/2016 1624 Full Code [id number]  Raynelle Bring, MD Inpatient    Questions for Most Recent Historical Code Status (Order [id number])    Question Answer Comment   In the event of cardiac or respiratory ARREST Do not call a "code blue"    In the event of cardiac or respiratory ARREST Do not perform Intubation, CPR, defibrillation or ACLS    In the event of cardiac or respiratory ARREST Use medication by any route, position, wound care, and other measures to relive pain and suffering. May use oxygen, suction and manual treatment of airway obstruction as needed for comfort.         Advance Directive Documentation     Most Recent Value  Type of Advance Directive  Living will  Pre-existing out of facility DNR order (yellow form or pink MOST form)  -  "MOST" Form in Place?  -      Home/SNF/Other Admitted  Chief Complaint abnormal labs   Level of Care/Admitting Diagnosis ED Disposition    ED Disposition Condition [place]: [facility] [100102]  Level of Care: Med-Surg [16]  Diagnosis: Hypernatremia [749449]  Admitting Physician: Caren Griffins [[id number]]  Attending Physician: Caren Griffins [phone]  Estimated length of stay: past midnight tomorrow  Certification:: I certify this patient will need inpatient services for at least 2 midnights  PT Class (Do Not Modify): Inpatient [101]  PT Acc Code (Do Not Modify): Private [1]       Medical History Past Medical History:  Diagnosis Date  . Arthritis   . Bladder cancer ([place])    infiltrative high grade papillary urothelial carcinoma   . Dementia    mild   . Dyslipidemia   . ED (erectile dysfunction)   . Hypertension   . Palsy ([place])    super nuclear palsy managed by [name]  . Parkinson disease ([place])   . Pneumonia    x1 in adulthood   . Prostate cancer ([place])     Allergies No Known Allergies  IV Location/Drains/Wounds Patient Lines/Drains/Airways Status   Active Line/Drains/Airways    Name:   Placement date:   Placement time:   Site:   Days:   Peripheral IV 03/30/18 Right Antecubital   03/30/18    1243    Antecubital   less than 1   Peripheral IV 03/30/18 Right   03/30/18    1243    -   less than 1   Ureteral Drain/Stent Right ureter 6 Fr.   06/26/17    1126    Right ureter   277   Ureteral Drain/Stent Right ureter 6 Fr.   11/23/17    1643  Right ureter   127   Incision (Closed) 07/01/16 Hip Left   07/01/16    1625     637   Incision (Closed) 03/16/17 Penis   03/16/17    1120     379   Incision (Closed) 11/23/17 Perineum   11/23/17    1647     127          Labs/Imaging Results for orders placed or performed during the hospital encounter of 03/30/18 (from the past 48 hour(s))  CBC with Differential/Platelet     Status: Abnormal   Collection Time: 03/30/18  1:03 PM  Result Value Ref Range   WBC 8.0 4.0 - 10.5 K/uL   RBC 3.41 (L) 4.22 - 5.81 MIL/uL   Hemoglobin 9.1 (L) 13.0 - 17.0 g/dL   HCT 29.4 (L) 39.0 - 52.0 %   MCV 86.2 78.0 - 100.0 fL   MCH 26.7 26.0 - 34.0 pg   MCHC 31.0 30.0 - 36.0 g/dL   RDW 17.3 (H) 11.5 - 15.5 %   Platelets 311 150 - 400 K/uL   Neutrophils Relative % 76 %   Neutro Abs  6.1 1.7 - 7.7 K/uL   Lymphocytes Relative 15 %   Lymphs Abs 1.2 0.7 - 4.0 K/uL   Monocytes Relative 8 %   Monocytes Absolute 0.6 0.1 - 1.0 K/uL   Eosinophils Relative 1 %   Eosinophils Absolute 0.1 0.0 - 0.7 K/uL   Basophils Relative 0 %   Basophils Absolute 0.0 0.0 - 0.1 K/uL    Comment: Performed at [facility], [place] [address] [place], [place] [address]  Basic metabolic panel     Status: Abnormal   Collection Time: 03/30/18  1:03 PM  Result Value Ref Range   Sodium 152 (H) 135 - 145 mmol/L   Potassium 5.6 (H) 3.5 - 5.1 mmol/L   Chloride 123 (H) 98 - 111 mmol/L   CO2 19 (L) 22 - 32 mmol/L   Glucose, Bld 106 (H) 70 - 99 mg/dL   BUN 134 (H) 8 - 23 mg/dL    Comment: RESULTS CONFIRMED BY MANUAL DILUTION   Creatinine, Ser 6.37 (H) 0.61 - 1.24 mg/dL   Calcium 10.1 8.9 - 10.3 mg/dL   GFR calc non Af Amer 7 (L) >60 mL/min   GFR calc Af Amer 9 (L) >60 mL/min    Comment: (NOTE) The eGFR has been calculated using the CKD EPI equation. This calculation has not been validated in all clinical situations. eGFR's persistently <60 mL/min signify possible Chronic Kidney Disease.    Anion gap 10 5 - 15    Comment: Performed at [facility], [place] [address] [place], [place] [address]  Urinalysis, Routine w reflex microscopic     Status: Abnormal   Collection Time: 03/30/18  1:50 PM  Result Value Ref Range   Color, Urine YELLOW YELLOW   APPearance TURBID (A) CLEAR   Specific Gravity, Urine 1.013 1.005 - 1.030   pH 6.0 5.0 - 8.0   Glucose, UA NEGATIVE NEGATIVE mg/dL   Hgb urine dipstick LARGE (A) NEGATIVE   Bilirubin Urine NEGATIVE NEGATIVE   Ketones, ur 5 (A) NEGATIVE mg/dL   Protein, ur 100 (A) NEGATIVE mg/dL   Nitrite NEGATIVE NEGATIVE   Leukocytes, UA MODERATE (A) NEGATIVE   RBC / HPF >50 (H) 0 - 5 RBC/hpf   WBC, UA >50 (H) 0 - 5 WBC/hpf   Bacteria, UA RARE (A) NONE SEEN   WBC  Clumps PRESENT     Comment: Performed at [facility], [place] [address] [place], [place] [address]   No results found.  Pending Labs [employer] (From admission, onward)    Start     Ordered   03/30/18 1338  Urine culture  STAT,   STAT    Question:  Patient immune status  Answer:  Normal   03/30/18 1337   Signed and Held  Basic metabolic panel  Tomorrow morning,   R     Signed and Held   Signed and Held  CBC  Tomorrow morning,   R     Signed and Held          Vitals/Pain Today's Vitals   03/30/18 1230 03/30/18 1430 03/30/18 1500 03/30/18 1534  BP: 106/62 105/75 111/67   Pulse: 67 73 66   Resp: (!) _0 Temp:      TempSrc:      SpO2: 99% 100% 100%   Weight:      Height:      PainSc:    0-No pain    Isolation Precautions No active isolations  Medications Medications  0.9 %  sodium chloride infusion ( Intravenous New Bag/Given 03/30/18 1418)  Influenza vac split quadrivalent PF (FLUZONE HIGH-DOSE) injection 0.5 mL (has no administration in time range)  sodium chloride 0.9 % bolus 500 mL (0 mLs Intravenous Stopped 03/30/18 1406)  sodium chloride 0.9 % bolus 500 mL (0 mLs Intravenous Stopped 03/30/18 1448)  cefTRIAXone (ROCEPHIN) 1 g in sodium chloride 0.9 % 100 mL IVPB (0 g Intravenous Stopped 03/30/18 1615)    Mobility non-ambulatory

## 2018-03-30 NOTE — ED Provider Notes (Signed)
[facility] DEPT Provider Note   CSN: [id number] Arrival date & time: 03/30/18  1053     History   Chief Complaint Chief Complaint  Patient presents with  . Abnormal labs    HPI Brian Montgomery is a 82 y.o. male.  Patient is an 82 year old male with a history of supra nuclear palsy, Parkinson's disease, hypertension and mild dementia who lives at home with family and being brought in today due to report of abnormal labs from his PCP.  Patient saw Dr. Redmond School earlier this week for a routine appointment.  He had blood work done at that time.  Family is not currently present but reported to EMS that he has been at his baseline.  This is he will wake up and say yes and no to questions but does not speak further.  They had denied any fever, decreased p.o. intake or other concerns.  The history is provided by the EMS personnel and a caregiver. The history is limited by the absence of a caregiver.    Past Medical History:  Diagnosis Date  . Arthritis   . Bladder cancer ([place])    infiltrative high grade papillary urothelial carcinoma   . Dementia    mild   . Dyslipidemia   . ED (erectile dysfunction)   . Hypertension   . Palsy ([place])    super nuclear palsy managed by [name]  . Parkinson disease ([place])   . Pneumonia    x1 in adulthood   . Prostate cancer [facility])     Patient Active Problem List   Diagnosis Date Noted  . Parkinson's disease ([place]) 01/16/2018  . Advance care planning   . Goals of care, counseling/discussion   . Palliative care by specialist   . History of DVT (deep vein thrombosis) 01/10/2018  . Hypernatremia 01/10/2018  . Anemia due to chronic kidney disease 01/10/2018  . AKI (acute kidney injury) ([place]) 01/10/2018  . Aspiration pneumonia of right lower lobe ([place]) 01/08/2018  . Ureteral obstruction, right 06/26/2017  . PSP (progressive supranuclear palsy) ([place]) 08/25/2016  . Dementia 07/03/2016  . CKD (chronic kidney disease) stage 3,  GFR 30-59 ml/min (HCC) 07/01/2016  . Hip fracture ([place]) 06/30/2016  . Weakness generalized 06/13/2016  . Malignant neoplasm of prostate ([place]) 03/18/2016  . Bladder cancer ([place]) 01/21/2016  . Appendicular ataxia 09/24/2015  . Pruritus 09/24/2015  . Hereditary and idiopathic peripheral neuropathy 01/14/2015  . Gait instability 10/13/2014  . Hyperlipidemia with target LDL less than 100 08/06/2013  . Essential hypertension, benign 09/17/2012    Past Surgical History:  Procedure Laterality Date  . APPENDECTOMY    . BACK SURGERY     lumbar ' years ago   . COLONOSCOPY  2007   Dr.kaplan  . CYSTOSCOPY N/A 03/22/2016   Procedure: CYSTOSCOPY;  Surgeon: Raynelle Bring, MD;  Location[facility];  Service: Urology;  Laterality: N/A;  . CYSTOSCOPY W/ RETROGRADES N/A 01/21/2016   Procedure: CYSTOSCOPY WITH TURBT WITH  CLOT EVACUATION;  Surgeon: Raynelle Bring, MD;  [facility];  Service: Urology;  Laterality: N/A;  . CYSTOSCOPY W/ RETROGRADES N/A 10/20/2016   Procedure: CYSTOSCOPY WITH BILATERAL RETROGRADE PYELOGRAM;  Surgeon: Raynelle Bring, MD;  [facility];  Service: Urology;  Laterality: N/A;  . CYSTOSCOPY W/ URETERAL STENT PLACEMENT Right 06/26/2017   Procedure: CYSTOSCOPY WITH STENT CHANGE;  Surgeon: Raynelle Bring, MD;  [facility];  Service: Urology;  Laterality: Right;  . cystoscopy with stent exchange     06-26-17  Dr Alinda Money  . CYSTOSCOPY WITH STENT PLACEMENT Right 03/16/2017   Procedure: CYSTOSCOPY WITH STENT PLACEMENT;  Surgeon: Raynelle Bring, MD;  [facility];  Service: Urology;  Laterality: Right;  . CYSTOSCOPY WITH STENT PLACEMENT Right 11/23/2017   Procedure: CYSTOSCOPY REMOVAL AND  STENT PLACEMENT;  Surgeon: Raynelle Bring, MD;  [facility];  Service: Urology;  Laterality: Right;  . INTRAMEDULLARY (IM) NAIL INTERTROCHANTERIC Left 07/01/2016   Procedure: INTRAMEDULLARY (IM) NAIL INTERTROCHANTERIC;  Surgeon: Tania Ade, MD;  Location: [facility] [facility];  Service: Orthopedics;   Laterality: Left;  . left total knee Left 2011  . PROSTATE BIOPSY N/A 03/22/2016   Procedure: BIOPSY TRANSRECTAL ULTRASONIC PROSTATE (TUBP);  Surgeon: Raynelle Bring, MD;  [facility];  Service: Urology;  Laterality: N/A;  . TRANSURETHRAL RESECTION OF BLADDER TUMOR N/A 03/22/2016   Procedure: TRANSURETHRAL RESECTION OF BLADDER TUMOR (TURBT);  Surgeon: Raynelle Bring, MD;  [facility];  Service: Urology;  Laterality: N/A;  . TRANSURETHRAL RESECTION OF BLADDER TUMOR N/A 10/20/2016   Procedure: TRANSURETHRAL RESECTION OF BLADDER TUMOR (TURBT);  Surgeon: Raynelle Bring, MD;  [facility];  Service: Urology;  Laterality: N/A;  . TRANSURETHRAL RESECTION OF BLADDER TUMOR N/A 03/16/2017   Procedure: TRANSURETHRAL RESECTION OF BLADDER TUMOR (TURBT);  Surgeon: Raynelle Bring, MD;  [facility];  Service: Urology;  Laterality: N/A;        Home Medications    Prior to Admission medications   Medication Sig Start Date End Date Taking? Authorizing Provider  acetaminophen (TYLENOL) 500 MG tablet Take 500 mg by mouth every 6 (six) hours as needed for moderate pain or headache.    [provider]  apixaban (ELIQUIS) 5 MG TABS tablet Take 1 tablet (5 mg total) by mouth 2 (two) times daily. 02/02/18   Medina-Vargas, Monina C, NP  carbidopa-levodopa (SINEMET IR) 25-100 MG tablet Take 1.5 tablets by mouth 3 (three) times daily. 02/02/18   Medina-Vargas, Monina C, NP  citalopram (CELEXA) 20 MG tablet TAKE 1 TABLET(20 MG) BY MOUTH DAILY 02/02/18   Medina-Vargas, Monina C, NP  donepezil (ARICEPT) 10 MG tablet Take 1 tablet (10 mg) by mouth at bedtime. 02/02/18   Medina-Vargas, Monina C, NP  hydroxypropyl methylcellulose / hypromellose (ISOPTO TEARS / GONIOVISC) 2.5 % ophthalmic solution Place 1 drop into both eyes 2 (two) times daily as needed for dry eyes. 02/02/18   Medina-Vargas, Monina C, NP  lisinopril-hydrochlorothiazide (PRINZIDE,ZESTORETIC) 10-12.5 MG tablet Take 1 tablet by mouth daily.  02/02/18   Medina-Vargas, Monina C, NP  Melatonin 3 MG CAPS Take 3 mg by mouth every other day as needed (for sleep.).     [provider]  NUTRITIONAL SUPPLEMENT LIQD Take 120 mLs by mouth daily. [employer]    [provider]  pseudoephedrine-guaifenesin (MUCINEX D) 60-600 MG 12 hr tablet Take 1 tablet by mouth at bedtime as needed for congestion.     [provider]  QUEtiapine (SEROQUEL) 50 MG tablet Take 1 tablet (50 mg total) by mouth at bedtime. 02/02/18   Medina-Vargas, Monina C, NP  simvastatin (ZOCOR) 20 MG tablet Take 1 tablet (20 mg total) by mouth daily. 02/02/18   Medina-Vargas, Senaida Lange, NP    Family History Family History  Problem Relation Age of Onset  . Heart failure Mother   . Hypertension Mother   . Heart failure Father   . Hypertension Father   . Renal Disease Sister   . Diabetes Brother     Social History Social History   Tobacco  Use  . Smoking status: Former Smoker    Packs/day: 0.25    Years: 2.00    Pack years: 0.50    Types: Cigarettes    Last attempt to quit: 07/12/1967    Years since quitting: 50.7  . Smokeless tobacco: Never Used  Substance Use Topics  . Alcohol use: No    Alcohol/week: 0.0 standard drinks  . Drug use: No     Allergies   Patient has no known allergies.   Review of Systems Review of Systems  Unable to perform ROS: Patient nonverbal     Physical Exam Updated Vital Signs Ht 5\' 4"  (1.626 m)   Wt 54.4 kg   SpO2 98%   BMI 20.60 kg/m   Physical Exam  Constitutional: He appears well-developed and well-nourished. No distress.  cachectic  HENT:  Head: Normocephalic and atraumatic.  Mouth/Throat: Oropharynx is clear and moist.  Dry mucous membranes and several small palate ulcers  Eyes: Pupils are equal, round, and reactive to light. Conjunctivae and EOM are normal.  Neck: Normal range of motion. Neck supple.  Cardiovascular: Normal rate, regular rhythm and intact distal pulses.  No murmur  heard. Pulmonary/Chest: Effort normal and breath sounds normal. No respiratory distress. He has no wheezes. He has no rales.  Abdominal: Soft. He exhibits no distension. There is no tenderness. There is no rebound and no guarding.  Musculoskeletal: Normal range of motion. He exhibits no edema or tenderness.  Neurological: He is alert.  Pt will say yes or no and follow some commands but unable to tell if oriented due to limited responses.  Mild tremor noted in bilateral upper ext.  Pupils reactive.  Moving lower ext.    Skin: Skin is warm and dry. No rash noted. No erythema.  Nursing note and vitals reviewed.    ED Treatments / Results  Labs (all labs ordered are listed, but only abnormal results are displayed) Labs Reviewed  CBC WITH DIFFERENTIAL/PLATELET - Abnormal; Notable for the following components:      Result Value   RBC 3.41 (*)    Hemoglobin 9.1 (*)    HCT 29.4 (*)    RDW 17.3 (*)    All other components within normal limits  BASIC METABOLIC PANEL - Abnormal; Notable for the following components:   Sodium 152 (*)    Potassium 5.6 (*)    Chloride 123 (*)    CO2 19 (*)    Glucose, Bld 106 (*)    BUN 134 (*)    Creatinine, Ser 6.37 (*)    GFR calc non Af Amer 7 (*)    GFR calc Af Amer 9 (*)    All other components within normal limits  URINALYSIS, ROUTINE W REFLEX MICROSCOPIC - Abnormal; Notable for the following components:   APPearance TURBID (*)    Hgb urine dipstick LARGE (*)    Ketones, ur 5 (*)    Protein, ur 100 (*)    Leukocytes, UA MODERATE (*)    RBC / HPF >50 (*)    WBC, UA >50 (*)    Bacteria, UA RARE (*)    All other components within normal limits  URINE CULTURE    EKG None  Radiology No results found.  Procedures Procedures (including critical care time)  Medications Ordered in ED Medications - No data to display   Initial Impression / Assessment and Plan / ED Course  I have reviewed the triage vital signs and the nursing  notes.  Pertinent labs &  imaging results that were available during my care of the patient were reviewed by me and considered in my medical decision making (see chart for details).     Elderly male presenting today for concern for abnormal labs.  Patient saw his PCP earlier this week for routine appointment and today was called stating his creatinine and sodium were abnormal.  Family did not report anything unusual to paramedics prior to transport.  They denied any infectious symptoms or decreased p.o. intake.  On exam patient is in no acute distress.  No focal findings on exam except for dry mucous membranes.  Will repeat lab work and wait for family to arrive.  2:50 PM Patient's labs today consistent with acute kidney failure with a creatinine of 6, hypernatremia of 152, stable CBC with a persistent hemoglobin of 9 and UA concerning for infection with moderate leukocytes and greater than 50 white blood cells.  Urine culture pending.  Patient was given IV Rocephin.  Patient initially was hypotensive upon arrival here which is improved with a 1 L fluid bolus.  We will continue IV fluid drip.  Discussed with family patient status and end-of-life care.  He is currently a DNR but will have palliative care consult.  Final Clinical Impressions(s) / ED Diagnoses   Final diagnoses:  Urinary tract infection without hematuria, site unspecified  Acute renal failure, unspecified acute renal failure type [facility])  Dehydration  Hypernatremia    ED Discharge Orders    None       Blanchie Dessert, MD 03/30/18 1451

## 2018-03-30 NOTE — ED Triage Notes (Addendum)
Per [facility]- Pt resides at home. Full Code. Labs Tuesday at PCP. Abnormal results given today. Here for evaluation. Pt without complaints. Pt with HX of contractures as baseline.

## 2018-03-30 NOTE — Telephone Encounter (Signed)
Spoke to daughter to advise that pt needs to go to the ER due to abnormal labs. Pt daughter was advise that a call will be placed to [facility] per her request to advise of his arrival . [facility]

## 2018-03-30 NOTE — ED Notes (Signed)
Pt I&O completed by Laqueta Carina, RN and Harrell Lark, NT after bladder scan order.

## 2018-03-30 NOTE — H&P (Signed)
History and Physical    Brian Montgomery STM:[id number] DOB: 1935-11-09 DOA: 03/30/2018  I have briefly reviewed the patient's prior medical records in [place]  PCP: Denita Lung, MD  Patient coming from: home  Chief Complaint: Failure to thrive, weakness  HPI: Brian Montgomery is a 82 y.o. male with medical history significant of Parkinson's disease, supranuclear palsy, dementia, bladder cancer, hypertension, hyperlipidemia, who is being brought to the hospital by patient's family due to progressive lethargy and decline at home in the last couple weeks as well as little to no p.o. intake for the same amount of time.  Patient was hospitalized in July 2019 with aspiration pneumonia of the right lower lobe, and upon discharge she was sent to a skilled nursing facility.  The family tells me that they are unhappy with the care that he got over there and was not fed or cared for properly and eventually they took patient home about 2 weeks ago.  At that point he was so weak, and despite their best efforts to feed the patient he would not eat or drink much.  Patient himself currently has no complaints but is quite confused and with underlying dementia he is an unreliable historian.  Per family, he never reported pain, he was never febrile at home, did not have any diarrhea and other than the weakness and poor p.o. intake which has progressed they did not notice anything else.  Of note, he has been having issues with swallowing in the past and currently he has had difficulties more so at home.  ED Course: In the emergency room his vital signs are stable, blood work pertinent for hypernatremia with a sodium of 152, potassium 5.6, BUN 134 and creatinine 6.37.  His CBC shows a hemoglobin 9.1 but otherwise unremarkable.  Urinalysis show evidence of UTI.  Review of Systems: Unable to obtain review of systems due to underlying dementia  Past Medical History:  Diagnosis Date  . Arthritis   . Bladder cancer  ([place])    infiltrative high grade papillary urothelial carcinoma   . Dementia    mild   . Dyslipidemia   . ED (erectile dysfunction)   . Hypertension   . Palsy ([place])    super nuclear palsy managed by [name]  . Parkinson disease ([place])   . Pneumonia    x1 in adulthood   . Prostate cancer [facility])     Past Surgical History:  Procedure Laterality Date  . APPENDECTOMY    . BACK SURGERY     lumbar ' years ago   . COLONOSCOPY  2007   Dr.kaplan  . CYSTOSCOPY N/A 03/22/2016   Procedure: CYSTOSCOPY;  Surgeon: Raynelle Bring, MD;  [facility];  Service: Urology;  Laterality: N/A;  . CYSTOSCOPY W/ RETROGRADES N/A 01/21/2016   Procedure: CYSTOSCOPY WITH TURBT WITH  CLOT EVACUATION;  Surgeon: Raynelle Bring, MD;  [facility];  Service: Urology;  Laterality: N/A;  . CYSTOSCOPY W/ RETROGRADES N/A 10/20/2016   Procedure: CYSTOSCOPY WITH BILATERAL RETROGRADE PYELOGRAM;  Surgeon: Raynelle Bring, MD;  [facility];  Service: Urology;  Laterality: N/A;  . CYSTOSCOPY W/ URETERAL STENT PLACEMENT Right 06/26/2017   Procedure: CYSTOSCOPY WITH STENT CHANGE;  Surgeon: Raynelle Bring, MD;  [facility];  Service: Urology;  Laterality: Right;  . cystoscopy with stent exchange     06-26-17 Dr Alinda Money  . CYSTOSCOPY WITH STENT PLACEMENT Right 03/16/2017   Procedure: CYSTOSCOPY WITH STENT PLACEMENT;  Surgeon: Raynelle Bring, MD;  [facility];  Service: Urology;  Laterality: Right;  . CYSTOSCOPY WITH STENT PLACEMENT Right 11/23/2017   Procedure: CYSTOSCOPY REMOVAL AND  STENT PLACEMENT;  Surgeon: Raynelle Bring, MD;  [facility];  Service: Urology;  Laterality: Right;  . INTRAMEDULLARY (IM) NAIL INTERTROCHANTERIC Left 07/01/2016   Procedure: INTRAMEDULLARY (IM) NAIL INTERTROCHANTERIC;  Surgeon: Tania Ade, MD;  Location: [facility];  Service: Orthopedics;  Laterality: Left;  . left total knee Left 2011  . PROSTATE BIOPSY N/A 03/22/2016   Procedure: BIOPSY TRANSRECTAL ULTRASONIC PROSTATE (TUBP);   Surgeon: Raynelle Bring, MD;  [facility];  Service: Urology;  Laterality: N/A;  . TRANSURETHRAL RESECTION OF BLADDER TUMOR N/A 03/22/2016   Procedure: TRANSURETHRAL RESECTION OF BLADDER TUMOR (TURBT);  Surgeon: Raynelle Bring, MD;  [facility];  Service: Urology;  Laterality: N/A;  . TRANSURETHRAL RESECTION OF BLADDER TUMOR N/A 10/20/2016   Procedure: TRANSURETHRAL RESECTION OF BLADDER TUMOR (TURBT);  Surgeon: Raynelle Bring, MD;  [facility];  Service: Urology;  Laterality: N/A;  . TRANSURETHRAL RESECTION OF BLADDER TUMOR N/A 03/16/2017   Procedure: TRANSURETHRAL RESECTION OF BLADDER TUMOR (TURBT);  Surgeon: Raynelle Bring, MD;  [facility];  Service: Urology;  Laterality: N/A;     reports that he quit smoking about 50 years ago. His smoking use included cigarettes. He has a 0.50 pack-year smoking history. He has never used smokeless tobacco. He reports that he does not drink alcohol or use drugs.  No Known Allergies  Family History  Problem Relation Age of Onset  . Heart failure Mother   . Hypertension Mother   . Heart failure Father   . Hypertension Father   . Renal Disease Sister   . Diabetes Brother     Prior to Admission medications   Medication Sig Start Date End Date Taking? Authorizing Provider  acetaminophen (TYLENOL) 500 MG tablet Take 500 mg by mouth every 6 (six) hours as needed for moderate pain or headache.   Yes [provider]  apixaban (ELIQUIS) 5 MG TABS tablet Take 1 tablet (5 mg total) by mouth 2 (two) times daily. 02/02/18  Yes Medina-Vargas, Monina C, NP  carbidopa-levodopa (SINEMET IR) 25-100 MG tablet Take 1.5 tablets by mouth 3 (three) times daily. 02/02/18  Yes Medina-Vargas, Monina C, NP  citalopram (CELEXA) 20 MG tablet TAKE 1 TABLET(20 MG) BY MOUTH DAILY 02/02/18  Yes Medina-Vargas, Monina C, NP  donepezil (ARICEPT) 10 MG tablet Take 1 tablet (10 mg) by mouth at bedtime. 02/02/18  Yes Medina-Vargas, Monina C, NP  hydroxypropyl  methylcellulose / hypromellose (ISOPTO TEARS / GONIOVISC) 2.5 % ophthalmic solution Place 1 drop into both eyes 2 (two) times daily as needed for dry eyes. 02/02/18  Yes Medina-Vargas, Monina C, NP  lisinopril-hydrochlorothiazide (PRINZIDE,ZESTORETIC) 10-12.5 MG tablet Take 1 tablet by mouth daily. 02/02/18  Yes Medina-Vargas, Monina C, NP  Nutritional Supplements (ENSURE CLEAR PO) Take 237 mLs by mouth daily.   Yes [provider]  pseudoephedrine-guaifenesin (MUCINEX D) 60-600 MG 12 hr tablet Take 1 tablet by mouth at bedtime as needed for congestion.    Yes [provider]  QUEtiapine (SEROQUEL) 50 MG tablet Take 1 tablet (50 mg total) by mouth at bedtime. 02/02/18  Yes Medina-Vargas, Monina C, NP  simvastatin (ZOCOR) 20 MG tablet Take 1 tablet (20 mg total) by mouth daily. 02/02/18  Yes Medina-Vargas, Senaida Lange, NP    Physical Exam: Vitals:   03/30/18 1217 03/30/18 1230 03/30/18 1430 03/30/18 1500  BP: 95/60 106/62 105/75 111/67  Pulse:  70 67 73 66  Resp: 19 (!) 22 14 18   Temp:      TempSrc:      SpO2: 100% 99% 100% 100%  Weight:      Height:        Constitutional: Ill-appearing male, minimally verbal but answering some questions Eyes: lids and conjunctivae normal ENMT: Mucous membranes are dry Neck: normal, supple Respiratory: clear to auscultation bilaterally, no wheezing, no crackles. Normal respiratory effort. No accessory muscle use.  Cardiovascular: Regular rate and rhythm, no murmurs / rubs / gallops. No extremity edema. 2+ pedal pulses.  Abdomen: no tenderness, no masses palpated. Bowel sounds positive.  Musculoskeletal: Decreased muscle mass Skin: no rashes Neurologic: Moves all 4 extremities Psychiatric: Alert and oriented x0  Labs on Admission: I have personally reviewed following labs and imaging studies  CBC: Recent Labs  Lab 03/30/18 1303  WBC 8.0  NEUTROABS 6.1  HGB 9.1*  HCT 29.4*  MCV 86.2  PLT 732   Basic Metabolic Panel: Recent Labs    Lab 03/30/18 1303  NA 152*  K 5.6*  CL 123*  CO2 19*  GLUCOSE 106*  BUN 134*  CREATININE 6.37*  CALCIUM 10.1   GFR: Estimated Creatinine Clearance: 7.1 mL/min (A) (by C-G formula based on SCr of 6.37 mg/dL (H)). Liver Function Tests: No results for input(s): AST, ALT, ALKPHOS, BILITOT, PROT, ALBUMIN in the last 168 hours. No results for input(s): LIPASE, AMYLASE in the last 168 hours. No results for input(s): AMMONIA in the last 168 hours. Coagulation Profile: No results for input(s): INR, PROTIME in the last 168 hours. Cardiac Enzymes: No results for input(s): CKTOTAL, CKMB, CKMBINDEX, TROPONINI in the last 168 hours. BNP (last 3 results) No results for input(s): PROBNP in the last 8760 hours. HbA1C: No results for input(s): HGBA1C in the last 72 hours. CBG: No results for input(s): GLUCAP in the last 168 hours. Lipid Profile: No results for input(s): CHOL, HDL, LDLCALC, TRIG, CHOLHDL, LDLDIRECT in the last 72 hours. Thyroid Function Tests: No results for input(s): TSH, T4TOTAL, FREET4, T3FREE, THYROIDAB in the last 72 hours. Anemia Panel: No results for input(s): VITAMINB12, FOLATE, FERRITIN, TIBC, IRON, RETICCTPCT in the last 72 hours. Urine analysis:    Component Value Date/Time   COLORURINE YELLOW 03/30/2018 1350   APPEARANCEUR TURBID (A) 03/30/2018 1350   LABSPEC 1.013 03/30/2018 1350   PHURINE 6.0 03/30/2018 1350   GLUCOSEU NEGATIVE 03/30/2018 1350   HGBUR LARGE (A) 03/30/2018 1350   BILIRUBINUR NEGATIVE 03/30/2018 1350   BILIRUBINUR n 12/02/2015 1535   KETONESUR 5 (A) 03/30/2018 1350   PROTEINUR 100 (A) 03/30/2018 1350   UROBILINOGEN negative 12/02/2015 1535   UROBILINOGEN 0.2 02/17/2010 1259   NITRITE NEGATIVE 03/30/2018 1350   LEUKOCYTESUR MODERATE (A) 03/30/2018 1350     Radiological Exams on Admission: No results found.  EKG: Independently reviewed. Sinus rhythm  Assessment/Plan Active Problems:   Hypernatremia   Hyponatremia -This is  likely in the setting of profound dehydration, patient with little to no p.o. intake over the last 2 weeks. -Start IV fluids, repeat BMP in the morning  Hyperkalemia - likely in the setting of renal failure, IV fluids  Acute kidney injury and chronic kidney disease stage III -Creatinine significantly elevated from his baseline of around 1.5-1.7, likely prerenal, repeat renal function in the morning after hydration  Anemia of chronic disease -Hemoglobin stable, no bleeding  Possible UTI -Family reports foul-smelling urine, he is unreliable in terms of whether he has symptoms or not,  send urine culture and start empiric ceftriaxone  History of DVT -Continue Eliquis per a lower dose given renal failure  Parkinson's disease/dementia -Continue home medications  Dysphagia -Likely in the setting of Parkinson's disease and dementia, recently hospitalized with aspiration pneumonia, speech therapy consulted, for now keep n.p.o.  Hypertension -Hold lisinopril HCTZ due to renal failure  GOC -This DNR, family has requested palliative care consultation EDP requested consult.  Suspect he would benefit from home hospice or alternatively residential hospice if he truly will have no p.o. intake over the next day or so  DVT prophylaxis: On Eliquis Code Status: DNR Family Communication: Daughter present at bedside Disposition Plan: [facility] to [facility], this put to be determined Consults called: Palliative care    Admission status: inpatient    At the time of admission, it appears that the appropriate admission status for this patient is INPATIENT. This is judged to be reasonable and necessary in order to provide the required high service intensity to ensure the patient's safety given the presenting symptoms, physical exam findings, and initial radiographic and laboratory data in the context of their chronic comorbidities. Current circumstances are hypernatremia, renal failure, and it is felt to  place patient at high risk for further clinical deterioration threatening life, limb, or organ. Moreover, it is my clinical judgment that the patient will require inpatient hospital care spanning beyond 2 midnights from the point of admission and that early discharge would result in unnecessary risk of decompensation and readmission or threat to life, limb or bodily function.   Marzetta Board, MD [facility] Pager [phone]  If 7PM-7AM, please contact night-coverage [email or web address] Password [facility]  03/30/2018, 3:28 PM

## 2018-03-30 NOTE — ED Notes (Signed)
Bed: WA08 Expected date:  Expected time:  Means of arrival:  Comments: EMS-abnormal labs

## 2018-03-30 NOTE — Care Management Note (Signed)
Case Management Note  CM noted pt was active with [facility].  Contacted Santiago Glad who will follow for needs.  Kieu Quiggle, Benjaman Lobe, RN 03/30/2018, 11:50 AM

## 2018-03-30 NOTE — ED Notes (Signed)
ED Provider at bedside. PLUNKETT 

## 2018-03-30 NOTE — Plan of Care (Signed)
?  Problem: Elimination: ?Goal: Will not experience complications related to bowel motility ?Outcome: Progressing ?  ?Problem: Pain Managment: ?Goal: General experience of comfort will improve ?Outcome: Progressing ?  ?Problem: Safety: ?Goal: Ability to remain free from injury will improve ?Outcome: Progressing ?  ?

## 2018-03-31 ENCOUNTER — Inpatient Hospital Stay (HOSPITAL_COMMUNITY): Payer: Medicare Other

## 2018-03-31 DIAGNOSIS — N179 Acute kidney failure, unspecified: Secondary | ICD-10-CM

## 2018-03-31 DIAGNOSIS — Z7189 Other specified counseling: Secondary | ICD-10-CM

## 2018-03-31 DIAGNOSIS — N390 Urinary tract infection, site not specified: Secondary | ICD-10-CM

## 2018-03-31 DIAGNOSIS — E860 Dehydration: Secondary | ICD-10-CM

## 2018-03-31 DIAGNOSIS — Z515 Encounter for palliative care: Secondary | ICD-10-CM

## 2018-03-31 LAB — BASIC METABOLIC PANEL
ANION GAP: 9 (ref 5–15)
ANION GAP: 12 (ref 5–15)
ANION GAP: 10 (ref 5–15)
Anion gap: 7 (ref 5–15)
BUN: 111 mg/dL — ABNORMAL HIGH (ref 8–23)
BUN: 103 mg/dL — ABNORMAL HIGH (ref 8–23)
BUN: 104 mg/dL — AB (ref 8–23)
BUN: 96 mg/dL — AB (ref 8–23)
BUN: 98 mg/dL — ABNORMAL HIGH (ref 8–23)
CALCIUM: 9.3 mg/dL (ref 8.9–10.3)
CHLORIDE: 130 mmol/L — AB (ref 98–111)
CHLORIDE: 123 mmol/L — AB (ref 98–111)
CHLORIDE: 124 mmol/L — AB (ref 98–111)
CO2: 15 mmol/L — AB (ref 22–32)
CO2: 17 mmol/L — ABNORMAL LOW (ref 22–32)
CO2: 15 mmol/L — ABNORMAL LOW (ref 22–32)
CO2: 16 mmol/L — ABNORMAL LOW (ref 22–32)
CO2: 17 mmol/L — ABNORMAL LOW (ref 22–32)
Calcium: 9.7 mg/dL (ref 8.9–10.3)
Calcium: 9.3 mg/dL (ref 8.9–10.3)
Calcium: 9.4 mg/dL (ref 8.9–10.3)
Calcium: 9.4 mg/dL (ref 8.9–10.3)
Chloride: 129 mmol/L — ABNORMAL HIGH (ref 98–111)
Creatinine, Ser: 4.83 mg/dL — ABNORMAL HIGH (ref 0.61–1.24)
Creatinine, Ser: 4.83 mg/dL — ABNORMAL HIGH (ref 0.61–1.24)
Creatinine, Ser: 4.49 mg/dL — ABNORMAL HIGH (ref 0.61–1.24)
Creatinine, Ser: 4.26 mg/dL — ABNORMAL HIGH (ref 0.61–1.24)
Creatinine, Ser: 4.02 mg/dL — ABNORMAL HIGH (ref 0.61–1.24)
GFR calc Af Amer: 13 mL/min — ABNORMAL LOW (ref >60)
GFR calc non Af Amer: 10 mL/min — ABNORMAL LOW (ref >60)
GFR calc non Af Amer: 11 mL/min — ABNORMAL LOW (ref >60)
GFR calc non Af Amer: 12 mL/min — ABNORMAL LOW (ref >60)
GFR calc non Af Amer: 13 mL/min — ABNORMAL LOW (ref >60)
GFR, EST AFRICAN AMERICAN: 12 mL/min — AB (ref >60)
GFR, EST AFRICAN AMERICAN: 12 mL/min — AB (ref >60)
GFR, EST AFRICAN AMERICAN: 14 mL/min — AB (ref >60)
GFR, EST AFRICAN AMERICAN: 15 mL/min — AB (ref >60)
GFR, EST NON AFRICAN AMERICAN: 10 mL/min — AB (ref >60)
Glucose, Bld: 90 mg/dL (ref 70–99)
Glucose, Bld: 132 mg/dL — ABNORMAL HIGH (ref 70–99)
Glucose, Bld: 126 mg/dL — ABNORMAL HIGH (ref 70–99)
Glucose, Bld: 105 mg/dL — ABNORMAL HIGH (ref 70–99)
Glucose, Bld: 105 mg/dL — ABNORMAL HIGH (ref 70–99)
POTASSIUM: 5.4 mmol/L — AB (ref 3.5–5.1)
POTASSIUM: 4.8 mmol/L (ref 3.5–5.1)
POTASSIUM: 4.5 mmol/L (ref 3.5–5.1)
POTASSIUM: 4.4 mmol/L (ref 3.5–5.1)
Potassium: 5.6 mmol/L — ABNORMAL HIGH (ref 3.5–5.1)
SODIUM: 154 mmol/L — AB (ref 135–145)
SODIUM: 154 mmol/L — AB (ref 135–145)
SODIUM: 151 mmol/L — AB (ref 135–145)
Sodium: 153 mmol/L — ABNORMAL HIGH (ref 135–145)
Sodium: 151 mmol/L — ABNORMAL HIGH (ref 135–145)

## 2018-03-31 LAB — CBC
HCT: 26.1 % — ABNORMAL LOW (ref 39.0–52.0)
HEMOGLOBIN: 8.2 g/dL — AB (ref 13.0–17.0)
MCH: 27.2 pg (ref 26.0–34.0)
MCHC: 31.4 g/dL (ref 30.0–36.0)
MCV: 86.4 fL (ref 78.0–100.0)
Platelets: 271 10*3/uL (ref 150–400)
RBC: 3.02 MIL/uL — ABNORMAL LOW (ref 4.22–5.81)
RDW: 17.5 % — ABNORMAL HIGH (ref 11.5–15.5)
WBC: 9.5 10*3/uL (ref 4.0–10.5)

## 2018-03-31 MED ORDER — DEXTROSE 5 % IV SOLN
INTRAVENOUS | Status: DC
  Administered 2018-03-31 – 2018-04-01 (×2): via INTRAVENOUS

## 2018-03-31 MED ORDER — DEXTROSE 5 % IV BOLUS
500.0000 mL | Freq: Once | INTRAVENOUS | Status: AC
  Administered 2018-03-31: 500 mL via INTRAVENOUS

## 2018-03-31 MED ORDER — LIP MEDEX EX OINT
TOPICAL_OINTMENT | CUTANEOUS | Status: AC
  Administered 2018-03-31: 10:00:00
  Filled 2018-03-31: qty 7

## 2018-03-31 MED ORDER — ACETAMINOPHEN 160 MG/5ML PO SOLN
500.0000 mg | Freq: Four times a day (QID) | ORAL | Status: DC | PRN
  Administered 2018-03-31 – 2018-04-01 (×2): 500 mg via ORAL
  Filled 2018-03-31 (×2): qty 20.3

## 2018-03-31 MED ORDER — DEXTROSE-NACL 5-0.2 % IV SOLN
INTRAVENOUS | Status: DC
  Administered 2018-03-31: 08:00:00 via INTRAVENOUS

## 2018-03-31 MED ORDER — SODIUM CHLORIDE 0.9 % IV SOLN
1.0000 g | Freq: Once | INTRAVENOUS | Status: AC
  Administered 2018-03-31: 1 g via INTRAVENOUS
  Filled 2018-03-31: qty 10

## 2018-03-31 MED ORDER — SODIUM POLYSTYRENE SULFONATE 15 GM/60ML PO SUSP
30.0000 g | Freq: Once | ORAL | Status: AC
  Administered 2018-03-31: 30 g via ORAL
  Filled 2018-03-31: qty 120

## 2018-03-31 NOTE — Consult Note (Addendum)
Consultation Note Date: 03/31/2018   Patient Name: Brian Montgomery  DOB: [birth year]  MRN: [id number]  Age / Sex: 82 y.o., male  PCP: Brian Lung, MD Referring Physician: Kayleen Memos, DO  Reason for Consultation: Establishing goals of care, Hospice Evaluation, Non pain symptom management, Pain control and Psychosocial/spiritual support  HPI/Patient Profile: 82 y.o. male  with past medical history of bladder cancer status post chemo and radiation, Parkinson's disease, dementia, pneumonia, chronic kidney disease stage III, history of CVA, hyperlipidemia, hypertension, admitted on 03/30/2018 with increased left lethargy, decreased oral intake, profound dehydration.  Patient was also found to have a urinary tract infection.  Consult ordered for failure to thrive, goals of care.  Palliative medicine team has seen Brian Montgomery in the past in July 2019 for goals of care discussion  Clinical Assessment and Goals of Care: Met with patient, his spouse and 1 of his 2 daughters today.  Chart reviewed.  Patient has very advanced dementia.  He is dependent for all ADLs, bedbound.  He has to be fed.  There is no overt signs of aspiration such as coughing or choking.  His wife states that he is only strong enough to take 3 spoonfuls of liquid at a time and then she comes back to administer 3 more spoonfuls of liquid.  Through the course of the day he may be have a half a  glass of water and just bites of soft foods such as tuna salad or mashed potatoes  He sleeps the majority of the day.  He is beginning to endorse feeling uncomfortable but is unable to tell tell  me where he hurts.  He is exhibiting nearing death awareness, speaking to deceased family members, his brothers.  If he is still taking oral medications but they have to be crushed and administered throughout the day.  During this course of hospitalization patient was  found to have a urinary tract infection but now acute on chronic kidney disease.  Patient's prior baseline creatinine was 1.3.  Upon admission, his creatinine was 6.37.  Despite IV fluids, his creatinine is only improved to 4.83  Patient is unable to speak for himself at this point.  His healthcare proxy would be his wife, Brian Montgomery    SUMMARY OF RECOMMENDATIONS   DNR/DNI No hemodialysis No artificial feeding either temporary means such as a core track or via PEG FU appt with other family members 9/22 at 22 am Family sees Brian Montgomery is nearing end of life and her desires  residential hospice Consult placed to social work to help facilitate referral to residential hospice.  If patient is not accepted, will help patient and family with an alternate plan that unfortunately may look like skilled nursing facility with repeat rehab, repeat hospitalization Code Status/Advance Care Planning:  DNR    Symptom Management:   Pain: This is a new symptom for patient.  He is opioid nave.  Will order Tylenol liquid to start and monitor for need for other agents such as  tramadol or low-dose oxycodone  Agitation: This is well managed.  Continue with Seroquel nightly  Palliative Prophylaxis:   Aspiration, Bowel Regimen, Delirium Protocol, Eye Care, Frequent Pain Assessment, Oral Care and Turn Reposition  Additional Recommendations (Limitations, Scope, Preferences):  Avoid Hospitalization, Minimize Medications, Initiate Comfort Feeding, No Artificial Feeding, No Chemotherapy, No Hemodialysis, No Radiation, No Surgical Procedures and No Tracheostomy  Psycho-social/Spiritual:   Desire for further Chaplaincy support:no  Additional Recommendations: Referral to Community Resources   Prognosis:   < 2 weeks in the setting of advanced Parkinson's disease, dementia, with new acute kidney injury superimposed on chronic kidney disease; despite IVF, patient's creatinine has only  improved to 4.83.  He is experiencing generalized pain and is no longer eating and drinking but bites and sips.  Patient is at high risk for an acute aspiration event, sepsis, metabolic encephalopathy  Discharge Planning: Hospice facility      Primary Diagnoses: Present on Admission: . Hypernatremia   I have reviewed the medical record, interviewed the patient and family, and examined the patient. The following aspects are pertinent.  Past Medical History:  Diagnosis Date  . Arthritis   . Bladder cancer ([place])    infiltrative high grade papillary urothelial carcinoma   . Dementia    mild   . Dyslipidemia   . ED (erectile dysfunction)   . Hypertension   . 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 Parkinson disease ([place])   . Pneumonia    x1 in adulthood   . Prostate cancer [facility])    Social History   Socioeconomic History  . Marital status: Married    Spouse name: Not on file  . Number of children: Not on file  . Years of education: Not on file  . Highest education level: Not on file  Occupational History  . Not on file  Social Needs  . Financial resource strain: Not on file  . Food insecurity:    Worry: Not on file    Inability: Not on file  . Transportation needs:    Medical: Not on file    Non-medical: Not on file  Tobacco Use  . Smoking status: Former Smoker    Packs/day: 0.25    Years: 2.00    Pack years: 0.50    Types: Cigarettes    Last attempt to quit: 07/12/1967    Years since quitting: 50.7  . Smokeless tobacco: Never Used  Substance and Sexual Activity  . Alcohol use: No    Alcohol/week: 0.0 standard drinks  . Drug use: No  . Sexual activity: Not Currently    Partners: Female  Lifestyle  . Physical activity:    Days per week: Not on file    Minutes per session: Not on file  . Stress: Not on file  Relationships  . Social connections:    Talks on phone: Not on file    Gets together: Not on file    Attends religious service: Not on  file    Active member of club or organization: Not on file    Attends meetings of clubs or organizations: Not on file    Relationship status: Not on file  Other Topics Concern  . Not on file  Social History Narrative   Former smoker.  No alcohol use.     Family History  Problem Relation Age of Onset  . Heart failure Mother   . Hypertension Mother   . Heart failure Father   .  Hypertension Father   . Renal Disease Sister   . Diabetes Brother    Scheduled Meds: . apixaban  2.5 mg Oral BID  . carbidopa-levodopa  1.5 tablet Oral TID  . lip balm      . QUEtiapine  50 mg Oral QHS   Continuous Infusions: . cefTRIAXone (ROCEPHIN)  IV 1 g (03/31/18 1120)  . dextrose 5 % and 0.2 % NaCl 100 mL/hr at 03/31/18 0812   PRN Meds:.acetaminophen (TYLENOL) oral liquid 160 mg/5 mL Medications Prior to Admission:  Prior to Admission medications   Medication Sig Start Date End Date Taking? Authorizing Provider  acetaminophen (TYLENOL) 500 MG tablet Take 500 mg by mouth every 6 (six) hours as needed for moderate pain or headache.   Yes [provider]  apixaban (ELIQUIS) 5 MG TABS tablet Take 1 tablet (5 mg total) by mouth 2 (two) times daily. 02/02/18  Yes Medina-Vargas, Monina C, NP  carbidopa-levodopa (SINEMET IR) 25-100 MG tablet Take 1.5 tablets by mouth 3 (three) times daily. 02/02/18  Yes Medina-Vargas, Monina C, NP  citalopram (CELEXA) 20 MG tablet TAKE 1 TABLET(20 MG) BY MOUTH DAILY 02/02/18  Yes Medina-Vargas, Monina C, NP  donepezil (ARICEPT) 10 MG tablet Take 1 tablet (10 mg) by mouth at bedtime. 02/02/18  Yes Medina-Vargas, Monina C, NP  hydroxypropyl methylcellulose / hypromellose (ISOPTO TEARS / GONIOVISC) 2.5 % ophthalmic solution Place 1 drop into both eyes 2 (two) times daily as needed for dry eyes. 02/02/18  Yes Medina-Vargas, Monina C, NP  lisinopril-hydrochlorothiazide (PRINZIDE,ZESTORETIC) 10-12.5 MG tablet Take 1 tablet by mouth daily. 02/02/18  Yes Medina-Vargas, Monina C, NP   Nutritional Supplements (ENSURE CLEAR PO) Take 237 mLs by mouth daily.   Yes [provider]  pseudoephedrine-guaifenesin (MUCINEX D) 60-600 MG 12 hr tablet Take 1 tablet by mouth at bedtime as needed for congestion.    Yes [provider]  QUEtiapine (SEROQUEL) 50 MG tablet Take 1 tablet (50 mg total) by mouth at bedtime. 02/02/18  Yes Medina-Vargas, Monina C, NP  simvastatin (ZOCOR) 20 MG tablet Take 1 tablet (20 mg total) by mouth daily. 02/02/18  Yes Medina-Vargas, Monina C, NP   No Known Allergies Review of Systems  Unable to perform ROS: Dementia    Physical Exam  Constitutional:  Cachectic, frail elderly man; awakens to voice minimally conversant  HENT:  Head: Normocephalic and atraumatic.  Temporal wasting  Cardiovascular: Normal rate.  Pulmonary/Chest:  Weak inspiratory effort  Neurological:  Lethargic.  Opens his eyes to voice and light touch Capable of answering simple yes and no questions otherwise is asleep  Skin: Skin is warm and dry.  Psychiatric:  Affect flat No acute agitation otherwise unable to test  Nursing note and vitals reviewed.   Vital Signs: BP 107/67   Pulse 73   Temp 98.3 F (36.8 C) (Axillary)   Resp 15   Ht _0  (1.626 m)   Wt 54.4 kg   SpO2 100%   BMI 20.60 kg/m  Pain Scale: PAINAD   Pain Score: 0-No pain   SpO2: SpO2: 100 % O2 Device:SpO2: 100 % O2 Flow Rate: .   IO: Intake/output summary:   Intake/Output Summary (Last 24 hours) at 03/31/2018 1325 Last data filed at 03/31/2018 0900 Gross per 24 hour  Intake 2781.13 ml  Output 1400 ml  Net 1381.13 ml    LBM: Last BM Date: 03/30/18 Baseline Weight: Weight: 54.4 kg Most recent weight: Weight: 54.4 kg     Palliative Assessment/Data:  Flowsheet Rows     Most Recent Value  Intake Tab  Referral Department  Hospitalist  Unit at Time of Referral  Med/Surg Unit  Palliative Care Primary Diagnosis  Sepsis/Infectious Disease  Date Notified  03/30/18    Palliative Care Type  Return patient Palliative Care  Reason for referral  Pain, Clarify Goals of Care, End of Life Care Assistance, Counsel Regarding Hospice, Psychosocial or Spiritual support  Date of Admission  03/30/18  Date first seen by Palliative Care  03/31/18  # of days Palliative referral response time  1 Day(s)  # of days IP prior to Palliative referral  0  Clinical Assessment  Palliative Performance Scale Score  30%  Pain Max last 24 hours  Not able to report  Pain Min Last 24 hours  Not able to report  Dyspnea Max Last 24 Hours  Not able to report  Dyspnea Min Last 24 hours  Not able to report  Nausea Max Last 24 Hours  Not able to report  Nausea Min Last 24 Hours  Not able to report  Anxiety Max Last 24 Hours  Not able to report  Anxiety Min Last 24 Hours  Not able to report  Other Max Last 24 Hours  Not able to report  Psychosocial & Spiritual Assessment  Palliative Care Outcomes  Patient/Family meeting held?  Yes  Who was at the meeting?  pt, wife and 1 dtr  Palliative Care Outcomes  Improved pain interventions, Clarified goals of care, Provided psychosocial or spiritual support, Counseled regarding hospice  Patient/Family wishes: Interventions discontinued/not started   Mechanical Ventilation, Hemodialysis, Tube feedings/TPN, PEG, Trach, Vasopressors  Palliative Care follow-up planned  Yes, Facility      Time In: 1100 Time Out: 1215 Time Total: 75 min Greater than 50%  of this time was spent counseling and coordinating care related to the above assessment and plan.  Signed by: Dory Horn, NP   Please contact Palliative Medicine Team phone at [phone] for questions and concerns.  For individual provider: See Shea Evans

## 2018-03-31 NOTE — Progress Notes (Addendum)
PROGRESS NOTE  Brian Montgomery DQQ:[id number] DOB: 07-Dec-1935 DOA: 03/30/2018 PCP: Denita Lung, MD  HPI/Recap of past 24 hours: Brian Montgomery is a 82 y.o. male with medical history significant of Parkinson's disease, supranuclear palsy, dementia, bladder cancer, hypertension, hyperlipidemia, who is being brought to the hospital by patient's family due to progressive lethargy and decline at home in the last couple weeks as well as little to no p.o. intake for the same amount of time.  Patient was hospitalized in July 2019 with aspiration pneumonia of the right lower lobe, and upon discharge he was sent to a skilled nursing facility.  Of note, he has been having issues with swallowing in the past and currently he has had difficulties more so at home.  Admitted for progressive lethargy suspected secondary to hypernatremia/hyperkalemia/dehydration versus others.  03/31/2018: Patient seen and examined at his bedside.  He is alert and minimally interactive in the setting of dementia.  Appears hypovolemic.  Has no new complaints.  Assessment/Plan: Active Problems:   Hypernatremia  Generalized weakness/lethargy suspect secondary to hypernatremia, hyperkalemia, and severe dehydration Changed IV fluid to D5 one quarter normal saline at 100 cc/h Obtain BMP every 4 hours Closely monitor drop of sodium not to exceed more than 10 points in 24-hour. PT to assess Fall precautions  Hyperkalemia suspect secondary to renal failure 1 g calcium gluconate given once Kayexalate 30 g given once Started on a bowel regimen Repeat BMP  Acute kidney injury on CKD 3 Creatinine on presentation 6.37 with GFR of 9 Baseline creatinine is 1.3 Continue IV fluid Continue to monitor ins and outs Monitor urine output Repeat BMP in the morning  Chronic normocytic anemia Baseline hemoglobin 9 Hemoglobin 8.2 No sign of overt bleeding Repeat CBC in the morning  Urinary tract infection Urine analysis with WBC greater  than 50 Continue IV ceftriaxone Urine culture in process  Physical debility/ambulatory dysfunction PT to assess Fall precautions  Parkinson disease Continue home medications  History of DVT on Eliquis No acute complaints  History of bladder cancer No acute issues  History of dysphagia with recent admission for aspiration pneumonia Speech therapy to assess risks and give recommendations   Code Status: DNR  Family Communication: None at bedside  Disposition Plan: Home versus SNF in 1 to 2 days or when clinically stable   Consultants:  None  Procedures:  None  Antimicrobials:  Rocephin  DVT prophylaxis: Eliquis   Objective: Vitals:   03/30/18 1709 03/30/18 2010 03/31/18 0405 03/31/18 0837  BP: 126/65 117/72 (!) 99/49 107/67  Pulse: 66 64 80 73  Resp: 15 (!) 23 14 15   Temp: 97.8 F (36.6 C) 98 F (36.7 C) 99.1 F (37.3 C)   TempSrc: Oral Oral Oral   SpO2: 100% 100% 100% 100%  Weight:      Height:        Intake/Output Summary (Last 24 hours) at 03/31/2018 1025 Last data filed at 03/31/2018 0900 Gross per 24 hour  Intake 2781.13 ml  Output 1200 ml  Net 1581.13 ml   Filed Weights   03/30/18 1138  Weight: 54.4 kg    Exam:  . General: 82 y.o. year-old male well developed well nourished in no acute distress.  Alert and minimally interactive in the setting of dementia. . Cardiovascular: Regular rate and rhythm with no rubs or gallops.  No thyromegaly or JVD noted.   Marland Kitchen Respiratory: Clear to auscultation with no wheezes or rales. Good inspiratory effort. . Abdomen: Soft nontender nondistended with  normal bowel sounds x4 quadrants. . Musculoskeletal: No lower extremity edema. 2/4 pulses in all 4 extremities. Marland Kitchen Psychiatry: Mood is appropriate for condition and setting   Data Reviewed: CBC: Recent Labs  Lab 03/30/18 1303 03/31/18 0558  WBC 8.0 9.5  NEUTROABS 6.1  --   HGB 9.1* 8.2*  HCT 29.4* 26.1*  MCV 86.2 86.4  PLT 311 601   Basic  Metabolic Panel: Recent Labs  Lab 03/30/18 1303 03/31/18 0558  NA 152* 153*  K 5.6* 5.6*  CL 123* 129*  CO2 19* 15*  GLUCOSE 106* 90  BUN 134* 111*  CREATININE 6.37* 4.83*  CALCIUM 10.1 9.3   GFR: Estimated Creatinine Clearance: 9.4 mL/min (A) (by C-G formula based on SCr of 4.83 mg/dL (H)). Liver Function Tests: No results for input(s): AST, ALT, ALKPHOS, BILITOT, PROT, ALBUMIN in the last 168 hours. No results for input(s): LIPASE, AMYLASE in the last 168 hours. No results for input(s): AMMONIA in the last 168 hours. Coagulation Profile: No results for input(s): INR, PROTIME in the last 168 hours. Cardiac Enzymes: No results for input(s): CKTOTAL, CKMB, CKMBINDEX, TROPONINI in the last 168 hours. BNP (last 3 results) No results for input(s): PROBNP in the last 8760 hours. HbA1C: No results for input(s): HGBA1C in the last 72 hours. CBG: No results for input(s): GLUCAP in the last 168 hours. Lipid Profile: No results for input(s): CHOL, HDL, LDLCALC, TRIG, CHOLHDL, LDLDIRECT in the last 72 hours. Thyroid Function Tests: No results for input(s): TSH, T4TOTAL, FREET4, T3FREE, THYROIDAB in the last 72 hours. Anemia Panel: No results for input(s): VITAMINB12, FOLATE, FERRITIN, TIBC, IRON, RETICCTPCT in the last 72 hours. Urine analysis:    Component Value Date/Time   COLORURINE YELLOW 03/30/2018 1350   APPEARANCEUR TURBID (A) 03/30/2018 1350   LABSPEC 1.013 03/30/2018 1350   PHURINE 6.0 03/30/2018 1350   GLUCOSEU NEGATIVE 03/30/2018 1350   HGBUR LARGE (A) 03/30/2018 1350   BILIRUBINUR NEGATIVE 03/30/2018 1350   BILIRUBINUR n 12/02/2015 1535   KETONESUR 5 (A) 03/30/2018 1350   PROTEINUR 100 (A) 03/30/2018 1350   UROBILINOGEN negative 12/02/2015 1535   UROBILINOGEN 0.2 02/17/2010 1259   NITRITE NEGATIVE 03/30/2018 1350   LEUKOCYTESUR MODERATE (A) 03/30/2018 1350   Sepsis Labs: @LABRCNTIP (procalcitonin:4,lacticidven:4)  )No results found for this or any previous  visit (from the past 240 hour(s)).    Studies: No results found.  Scheduled Meds: . apixaban  2.5 mg Oral BID  . carbidopa-levodopa  1.5 tablet Oral TID  . lip balm      . QUEtiapine  50 mg Oral QHS    Continuous Infusions: . cefTRIAXone (ROCEPHIN)  IV    . dextrose 5 % and 0.2 % NaCl 100 mL/hr at 03/31/18 0932     LOS: 1 day     Kayleen Memos, MD [facility] Pager [phone]  If 7PM-7AM, please contact night-coverage [email or web address] Password [email or web address] 03/31/2018, 10:25 AM

## 2018-03-31 NOTE — Progress Notes (Signed)
Alert MD via text that Chloride is >130.

## 2018-03-31 NOTE — Progress Notes (Signed)
PT Cancellation Note  Patient Details Name: Brian Montgomery MRN: [id number] DOB: 08-08-1935   Cancelled Treatment:     PT order received but eval deferred at request of RN - pt meeting with family and palliative care.  Will follow.   Vint Pola 03/31/2018, 12:20 PM

## 2018-03-31 NOTE — Progress Notes (Signed)
OT Cancellation Note  Patient Details Name: Brian Montgomery MRN: [id number] DOB: [birth year]   Cancelled Treatment:    Reason Eval/Treat Not Completed: Other (comment) Palliative in with patient and family when OT came to check on patient. Will check back another time for OT evaluation.  Laquitha Heslin A Jordana Dugue 03/31/2018, 12:16 PM

## 2018-03-31 NOTE — Evaluation (Signed)
Clinical/Bedside Swallow Evaluation Patient Details  Name: Brian Montgomery MRN: [id number] Date of Birth: [birth year]  Today's Date: 03/31/2018 Time: SLP Start Time (ACUTE ONLY): 3016 SLP Stop Time (ACUTE ONLY): 1555 SLP Time Calculation (min) (ACUTE ONLY): 40 min  Past Medical History:  Past Medical History:  Diagnosis Date  . Arthritis   . Bladder cancer ([place])    infiltrative high grade papillary urothelial carcinoma   . Dementia    mild   . Dyslipidemia   . ED (erectile dysfunction)   . Hypertension   . 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 Parkinson disease ([place])   . Pneumonia    x1 in adulthood   . Prostate cancer [facility])    Past Surgical History:  Past Surgical History:  Procedure Laterality Date  . APPENDECTOMY    . BACK SURGERY     lumbar ' years ago   . COLONOSCOPY  2007   Dr.kaplan  . CYSTOSCOPY N/A 03/22/2016   Procedure: CYSTOSCOPY;  Surgeon: Raynelle Bring, MD;  Location: [facility];  Service: Urology;  Laterality: N/A;  . CYSTOSCOPY W/ RETROGRADES N/A 01/21/2016   Procedure: CYSTOSCOPY WITH TURBT WITH  CLOT EVACUATION;  Surgeon: Raynelle Bring, MD;  Location: [facility];  Service: Urology;  Laterality: N/A;  . CYSTOSCOPY W/ RETROGRADES N/A 10/20/2016   Procedure: CYSTOSCOPY WITH BILATERAL RETROGRADE PYELOGRAM;  Surgeon: Raynelle Bring, MD;  Location: [facility];  Service: Urology;  Laterality: N/A;  . CYSTOSCOPY W/ URETERAL STENT PLACEMENT Right 06/26/2017   Procedure: CYSTOSCOPY WITH STENT CHANGE;  Surgeon: Raynelle Bring, MD;  Location: [facility];  Service: Urology;  Laterality: Right;  . cystoscopy with stent exchange     06-26-17 Dr Alinda Money  . CYSTOSCOPY WITH STENT PLACEMENT Right 03/16/2017   Procedure: CYSTOSCOPY WITH STENT PLACEMENT;  Surgeon: Raynelle Bring, MD;  [facility];  Service: Urology;  Laterality: Right;  . CYSTOSCOPY WITH STENT PLACEMENT Right 11/23/2017   Procedure: CYSTOSCOPY REMOVAL AND  STENT PLACEMENT;  Surgeon: Raynelle Bring, MD;  Location:  [facility];  Service: Urology;  Laterality: Right;  . INTRAMEDULLARY (IM) NAIL INTERTROCHANTERIC Left 07/01/2016   Procedure: INTRAMEDULLARY (IM) NAIL INTERTROCHANTERIC;  Surgeon: Tania Ade, MD;  Location: [facility];  Service: Orthopedics;  Laterality: Left;  . left total knee Left 2011  . PROSTATE BIOPSY N/A 03/22/2016   Procedure: BIOPSY TRANSRECTAL ULTRASONIC PROSTATE (TUBP);  Surgeon: Raynelle Bring, MD;  [facility];  Service: Urology;  Laterality: N/A;  . TRANSURETHRAL RESECTION OF BLADDER TUMOR N/A 03/22/2016   Procedure: TRANSURETHRAL RESECTION OF BLADDER TUMOR (TURBT);  Surgeon: Raynelle Bring, MD;  [facility];  Service: Urology;  Laterality: N/A;  . TRANSURETHRAL RESECTION OF BLADDER TUMOR N/A 10/20/2016   Procedure: TRANSURETHRAL RESECTION OF BLADDER TUMOR (TURBT);  Surgeon: Raynelle Bring, MD;  [facility];  Service: Urology;  Laterality: N/A;  . TRANSURETHRAL RESECTION OF BLADDER TUMOR N/A 03/16/2017   Procedure: TRANSURETHRAL RESECTION OF BLADDER TUMOR (TURBT);  Surgeon: Raynelle Bring, MD;  [facility];  Service: Urology;  Laterality: N/A;   HPI:      Assessment / Plan / Recommendation Clinical Impression  Patient presents with a moderate oropharyngeal dysphagia characterized by decreased awareness of bolus, delays in anterior to posterior transit of bolus in oral cavity, delayed swallow initiation and overt coughing and throat clearing (immediate and delayed) with thin liquids and nectar thick liquids when straw used. Patient exhbiited oral preparatory phase difficulties and was not able to form mouth around  cup to take adequate sip. Patient's spouse said that she had been using straw with him because he wasn't able to sip from cup at home. Patient tolerated nectar thick liquids via spoon sips, and puree solids via spoon bites without overt s/s of aspiration or penetration. Patient appears with delayed swallow initiation, which is also noted in MBS report from July 2019.  During that evaluation, patient exhibited penetration but no aspiration and penetrates cleared spontaneously.  SLP Visit Diagnosis: Dysphagia, oropharyngeal phase (R13.12)   Needs MBS prior to discharge.    Aspiration Risk  Moderate aspiration risk    Diet Recommendation Dysphagia 1 (Puree);Nectar-thick liquid   Liquid Administration via: Spoon;No straw Medication Administration: Crushed with puree Supervision: Full supervision/cueing for compensatory strategies;Staff to assist with self feeding Compensations: Minimize environmental distractions;Slow rate;Small sips/bites Postural Changes: Seated upright at 90 degrees    Other  Recommendations Oral Care Recommendations: Oral care BID Other Recommendations: Order thickener from pharmacy;Clarify dietary restrictions;Other (Comment)(may have jello)   Follow up Recommendations Skilled Nursing facility    MBSS prior to discharge   Frequency and Duration min 2x/week  1 week       Prognosis Prognosis for Safe Diet Advancement: [place] Date of Onset: 03/30/18 Type of Study: Bedside Swallow Evaluation Previous Swallow Assessment: MBS in 01/2018 Diet Prior to this Study: NPO Temperature Spikes Noted: No Respiratory Status: Room air History of Recent Intubation: No Behavior/Cognition: Alert;Confused;Requires cueing;Doesn't follow directions Oral Care Completed by SLP: No Oral Cavity - Dentition: Dentures, bottom;Dentures, top Self-Feeding Abilities: Total assist Patient Positioning: Upright in bed Baseline Vocal Quality: Hoarse;Low vocal intensity Volitional Cough: Cognitively unable to elicit Volitional Swallow: Unable to elicit    Oral/Motor/Sensory Function Overall Oral Motor/Sensory Function: Moderate impairment Facial ROM: Reduced right;Reduced left Facial Symmetry: Within Functional Limits Lingual ROM: Reduced right;Reduced left Lingual Symmetry: Within Functional Limits Lingual Strength:  Reduced Velum: Within Functional Limits Mandible: Within Functional Limits   Ice Chips Ice chips: Not tested   Thin Liquid Thin Liquid: Impaired Presentation: Cup;Straw Oral Phase Impairments: Poor awareness of bolus Oral Phase Functional Implications: Prolonged oral transit Pharyngeal  Phase Impairments: Suspected delayed Swallow;Cough - Delayed;Cough - Immediate    Nectar Thick Nectar Thick Liquid: Impaired Presentation: Spoon;Straw Oral Phase Impairments: Poor awareness of bolus Pharyngeal Phase Impairments: Suspected delayed Swallow;Cough - Delayed Other Comments: Delayed cough with straw sips, no overt s/s of aspiration or penetration with spoon sips.    Honey Thick Honey Thick Liquid: Impaired Presentation: Spoon Oral Phase Impairments: Poor awareness of bolus Pharyngeal Phase Impairments: Suspected delayed Swallow   Puree Puree: Impaired Presentation: Spoon Oral Phase Functional Implications: Prolonged oral transit Pharyngeal Phase Impairments: Suspected delayed Swallow   Solid     Solid: Not tested      Sonia Baller, MA, CCC-SLP 03/31/18 5:31 PM

## 2018-03-31 NOTE — Progress Notes (Signed)
[facility] Liaison RN note  Received request from [place] for family interest in [facility]. Chart reviewed and spoke with family to acknowledge referral. Eligibility confirm unfortunately [facility] is not able to offer a room today. Family and CSW are aware [facility] liaison will follow up with CSW and family tomorrow or sooner if room becomes available. Please do not hesitate to call with questions. Thank you. ?   Vicente Serene, [facility] Liaison  [place] are on [facility]

## 2018-04-01 LAB — IRON AND TIBC
IRON: 41 ug/dL — AB (ref 45–182)
SATURATION RATIOS: 18 % (ref 17.9–39.5)
TIBC: 232 ug/dL — ABNORMAL LOW (ref 250–450)
UIBC: 191 ug/dL

## 2018-04-01 LAB — BASIC METABOLIC PANEL
ANION GAP: 8 (ref 5–15)
ANION GAP: 7 (ref 5–15)
BUN: 90 mg/dL — ABNORMAL HIGH (ref 8–23)
BUN: 86 mg/dL — ABNORMAL HIGH (ref 8–23)
CHLORIDE: 125 mmol/L — AB (ref 98–111)
CO2: 17 mmol/L — ABNORMAL LOW (ref 22–32)
CO2: 18 mmol/L — AB (ref 22–32)
Calcium: 9.4 mg/dL (ref 8.9–10.3)
Calcium: 9.4 mg/dL (ref 8.9–10.3)
Chloride: 123 mmol/L — ABNORMAL HIGH (ref 98–111)
Creatinine, Ser: 3.81 mg/dL — ABNORMAL HIGH (ref 0.61–1.24)
Creatinine, Ser: 3.6 mg/dL — ABNORMAL HIGH (ref 0.61–1.24)
GFR, EST AFRICAN AMERICAN: 16 mL/min — AB (ref >60)
GFR, EST AFRICAN AMERICAN: 17 mL/min — AB (ref >60)
GFR, EST NON AFRICAN AMERICAN: 14 mL/min — AB (ref >60)
GFR, EST NON AFRICAN AMERICAN: 15 mL/min — AB (ref >60)
GLUCOSE: 127 mg/dL — AB (ref 70–99)
Glucose, Bld: 138 mg/dL — ABNORMAL HIGH (ref 70–99)
POTASSIUM: 4.4 mmol/L (ref 3.5–5.1)
POTASSIUM: 4.2 mmol/L (ref 3.5–5.1)
SODIUM: 150 mmol/L — AB (ref 135–145)
Sodium: 148 mmol/L — ABNORMAL HIGH (ref 135–145)

## 2018-04-01 LAB — RETICULOCYTES
RBC.: 2.96 MIL/uL — AB (ref 4.22–5.81)
RETIC CT PCT: 0.7 % (ref 0.4–3.1)
Retic Count, Absolute: 20.7 10*3/uL (ref 19.0–186.0)

## 2018-04-01 LAB — HEMOGLOBIN AND HEMATOCRIT, BLOOD
HEMATOCRIT: 25.3 % — AB (ref 39.0–52.0)
HEMOGLOBIN: 8 g/dL — AB (ref 13.0–17.0)

## 2018-04-01 LAB — CBC
HEMATOCRIT: 23.6 % — AB (ref 39.0–52.0)
Hemoglobin: 7.5 g/dL — ABNORMAL LOW (ref 13.0–17.0)
MCH: 27.3 pg (ref 26.0–34.0)
MCHC: 31.8 g/dL (ref 30.0–36.0)
MCV: 85.8 fL (ref 78.0–100.0)
Platelets: 255 10*3/uL (ref 150–400)
RBC: 2.75 MIL/uL — AB (ref 4.22–5.81)
RDW: 17.6 % — ABNORMAL HIGH (ref 11.5–15.5)
WBC: 7.9 10*3/uL (ref 4.0–10.5)

## 2018-04-01 LAB — SAVE SMEAR

## 2018-04-01 LAB — VITAMIN B12: Vitamin B-12: 209 pg/mL (ref 180–914)

## 2018-04-01 LAB — URINE CULTURE
Culture: >=100000 — AB
Special Requests: NORMAL

## 2018-04-01 LAB — FERRITIN: Ferritin: 99 ng/mL (ref 24–336)

## 2018-04-01 MED ORDER — LIP MEDEX EX OINT
TOPICAL_OINTMENT | CUTANEOUS | Status: AC
  Administered 2018-04-01: 12:00:00
  Filled 2018-04-01: qty 7

## 2018-04-01 MED ORDER — CEPHALEXIN 500 MG PO CAPS: 500.0000 mg | ORAL_CAPSULE | Freq: Two times a day (BID) | ORAL | 0 refills | Status: AC

## 2018-04-01 MED ORDER — ACETAMINOPHEN 160 MG/5ML PO SOLN: 500.0000 mg | Freq: Four times a day (QID) | ORAL | 0 refills | Status: AC | PRN

## 2018-04-01 NOTE — Progress Notes (Signed)
Writer has given report to receiving nurse at this time at [facility]. Family at bedside and Corey Harold has been called for pickup and transfer of pt to [facility]. Pt without c/o. Lying in bed. Pleasant.

## 2018-04-01 NOTE — Progress Notes (Signed)
OT Cancellation Note  Patient Details Name: Barret Esquivel MRN: [id number] DOB: 02-Feb-1936   Cancelled Treatment:     Patient with poor prognosis with discharge plan of hospice. OT will sign off at this time.  Stefany Starace A Shaletha Humble 04/01/2018, 9:08 AM

## 2018-04-01 NOTE — Progress Notes (Addendum)
[facility] Liaison RN note @0900    Received request from [place] for family interest in [facility]. Chart reviewed and spoke with family to acknowledge referral.  No family at bedside however RN requested Liaison to contact the daughter Tammy with update. RN spoke with Tammy introduced [facility] that [facility] is not able to offer a room today. Daughter (Tammy-[phone]), Provider, bedside nurse Abigail Butts) and CSW were also updated and are aware [facility] liaison will follow up with CSW and family tomorrow or sooner if room becomes available. Please do not hesitate to call with questions.  Thank you. ?  [facility] Liaison  [place] are on [facility]

## 2018-04-01 NOTE — Progress Notes (Signed)
Pt leaving with [facility] at this time. Family present.

## 2018-04-01 NOTE — Discharge Summary (Addendum)
Discharge Summary  Brian Montgomery DGU:[id number] DOB: 10-24-1935  PCP: Denita Lung, MD  Admit date: 03/30/2018 Discharge date: 04/01/2018  Time spent: 25 mins  Recommendations for Outpatient Follow-up:  1. Follow up with hospice 2. Fall precautions   Discharge Diagnoses:  Active Hospital Problems   Diagnosis Date Noted  . Urinary tract infection without hematuria   . Hypernatremia 01/10/2018  . Dehydration 06/13/2016  . Acute renal failure ([place]) 06/13/2016    Resolved Hospital Problems  No resolved problems to display.    Discharge Condition: Stable  Diet recommendation per speech Therapist:  Diet Recommendation Dysphagia 1 (Puree);Nectar-thick liquid   Liquid Administration via: Spoon;No straw Medication Administration: Crushed with puree Supervision: Full supervision/cueing for compensatory strategies;Staff to assist with self feeding Compensations: Minimize environmental distractions;Slow rate;Small sips/bites Postural Changes: Seated upright at 90 degrees    Other  Recommendations Oral Care Recommendations: Oral care BID Other Recommendations: Order thickener from pharmacy;Clarify dietary restrictions;Other (Comment)(may have jello)      Vitals:   04/01/18 0519 04/01/18 1414  BP: 126/83 122/64  Pulse: 98 76  Resp: 16 14  Temp: 98 F (36.7 C) 98 F (36.7 C)  SpO2: 97% 100%    History of present illness:  Brian Collinsis a 82 y.o.malewith medical history significant ofParkinson's disease, supranuclear palsy, dementia, bladder cancer, hypertension, hyperlipidemia, who is being brought to the hospital by patient's family due to progressive lethargy and decline at home in the last couple weeks as well as little to no p.o. intake for the same amount of time. Patient was hospitalized in July 2019 with aspiration pneumonia of the right lower lobe, and upon discharge he was sent to a skilled nursing facility. Of note, he has been having issues with swallowing in  the past and currently he has had difficulties more so at home.  Admitted for progressive lethargy suspected secondary to hypernatremia/hyperkalemia/dehydration versus others.  03/31/2018: He is alert and minimally interactive in the setting of dementia.  Appears hypovolemic.  Has no new complaints.  04/01/2018: Patient seen and examined at bedside.  No acute events overnight.  Patient is alert however he is minimally verbal.  He has no new complaints.  Family made decision to continue with hospice care. Patient admitted at [facility] with Hospice. Patient is hemodynamically stable and will be transferred to facility.   Hospital Course:  Active Problems:   Dehydration   Acute renal failure (HCC)   Hypernatremia   Urinary tract infection without hematuria  Generalized weakness/lethargy suspect secondary to hypernatremia, hyperkalemia, and severe dehydration Electrolytes are improving with IV fluid hydration Avoid dehydration Fall precautions  Klebsiella pneumoniae urinary tract infection-non-ESBL  urine analysis with WBC greater than 50 Urine culture positive for 100,000 colonies of Klebsiella pneumonia non-ESBL Sensitive to IV ceftriaxone, completed 3 days IV rocephin. Keflex 500 mg BID x 5 days; BID due to renal insufficiency.  Poor functional status in the setting of dysphagia with moderate risk for aspiration Family made decision to continue with hospice care Continue pleasure feedings  Resolved hyperkalemia suspect secondary to renal failure Received 1 g calcium gluconate given once, Kayexalate 30 g given once, bowel regimen  Acute kidney injury on CKD 3 Creatinine on presentation 6.37 with GFR of 9 Baseline creatinine is 1.3 Creatinine 3.6 on 04/01/2018 Avoid dehydration Continue to avoid nephrotoxic agents/dehydration/hypotension  Chronic normocytic anemia Baseline hemoglobin 9 Drop in hemoglobin this morning.  Repeated an H&H which was 8.0   No sign of overt  bleeding  Physical debility/ambulatory  dysfunction PT recommended SNF and 24-hour assistance and monitoring Fall precautions  Parkinson disease Continue home medications  History of DVT on Eliquis No acute complaints No sign of overt bleeding On eliquis  History of bladder cancer No acute issues Poor functional status  History of dysphagia with recent admission for aspiration pneumonia Speech therapy recommended pured diet with thick liquids Continue pleasure feedings   Code Status: DNR   Consultants:  Palliative Care Team  Procedures:  None  Antimicrobials:  Completed 3 days of Rocephin  DVT prophylaxis: Eliquis  Discharge Exam: BP 122/64   Pulse 76   Temp 98 F (36.7 C) (Oral)   Resp 14   Ht 5\' 4"  (1.626 m)   Wt 54.4 kg   SpO2 100%   BMI 20.60 kg/m  . General: 82 y.o. year-old male frail, in no acute distress. Alert and minimally verbal in the setting of dementia. . Cardiovascular: Regular rate and rhythm with no rubs or gallops.  No thyromegaly or JVD noted.   Marland Kitchen Respiratory: Clear to auscultation with no wheezes or rales. Good inspiratory effort. . Abdomen: Soft nontender nondistended with normal bowel sounds x4 quadrants. . Musculoskeletal: No lower extremity edema. 2/4 pulses in all 4 extremities. Marland Kitchen Psychiatry: Mood is appropriate for condition and setting  Discharge Instructions You were cared for by a hospitalist during your hospital stay. If you have any questions about your discharge medications or the care you received while you were in the hospital after you are discharged, you can call the unit and asked to speak with the hospitalist on call if the hospitalist that took care of you is not available. Once you are discharged, your primary care physician will handle any further medical issues. Please note that NO REFILLS for any discharge medications will be authorized once you are discharged, as it is imperative that you return to  your primary care physician (or establish a relationship with a primary care physician if you do not have one) for your aftercare needs so that they can reassess your need for medications and monitor your lab values.   Allergies as of 04/01/2018   No Known Allergies     Medication List    STOP taking these medications   acetaminophen 500 MG tablet Commonly known as:  TYLENOL Replaced by:  acetaminophen 160 MG/5ML solution   lisinopril-hydrochlorothiazide 10-12.5 MG tablet Commonly known as:  PRINZIDE,ZESTORETIC   pseudoephedrine-guaifenesin 60-600 MG 12 hr tablet Commonly known as:  MUCINEX D     TAKE these medications   acetaminophen 160 MG/5ML solution Commonly known as:  TYLENOL Take 15.6 mLs (500 mg total) by mouth every 6 (six) hours as needed for mild pain, moderate pain, headache or fever. Replaces:  acetaminophen 500 MG tablet   apixaban 5 MG Tabs tablet Commonly known as:  ELIQUIS Take 1 tablet (5 mg total) by mouth 2 (two) times daily.   carbidopa-levodopa 25-100 MG tablet Commonly known as:  SINEMET IR Take 1.5 tablets by mouth 3 (three) times daily.   cephALEXin 500 MG capsule Commonly known as:  KEFLEX Take 1 capsule (500 mg total) by mouth 2 (two) times daily for 5 days.   citalopram 20 MG tablet Commonly known as:  CELEXA TAKE 1 TABLET(20 MG) BY MOUTH DAILY   donepezil 10 MG tablet Commonly known as:  ARICEPT Take 1 tablet (10 mg) by mouth at bedtime.   ENSURE CLEAR PO Take 237 mLs by mouth daily.   hydroxypropyl methylcellulose / hypromellose 2.5 %  ophthalmic solution Commonly known as:  ISOPTO TEARS / GONIOVISC Place 1 drop into both eyes 2 (two) times daily as needed for dry eyes.   QUEtiapine 50 MG tablet Commonly known as:  SEROQUEL Take 1 tablet (50 mg total) by mouth at bedtime.   simvastatin 20 MG tablet Commonly known as:  ZOCOR Take 1 tablet (20 mg total) by mouth daily.      No Known Allergies Follow-up Information    Denita Lung, MD .   Specialty:  Family Medicine Contact information: [place] [address] [phone]            The results of significant diagnostics from this hospitalization (including imaging, microbiology, ancillary and laboratory) are listed below for reference.    Significant Diagnostic Studies: US Renal  Result Date: 03/31/2018 CLINICAL DATA:  82 year old male with acute on chronic kidney disease and UTI. Patient has RIGHT urinary stent and history of bladder and prostate cancer. EXAM: RENAL / URINARY TRACT ULTRASOUND COMPLETE COMPARISON:  02/09/2017 CT.  11/23/2017 retrograde study FINDINGS: Right Kidney: Length: 10.4 cm. Urinary stent within the renal pelvis noted. Mild to moderate RIGHT hydronephrosis noted. No solid mass identified. Left Kidney: Length: 10.8 cm. Mild to moderate LEFT hydronephrosis noted. No solid mass identified. Bladder: Urinary stent within the bladder identified. Mild posterior bladder wall thickening identified. IMPRESSION: Mild to moderate bilateral hydronephrosis. RIGHT urinary stent identified. Electronically Signed   By: Margarette Canada M.D.   On: 03/31/2018 17:00    Microbiology: Recent Results (from the past 240 hour(s))  Urine culture     Status: Abnormal   Collection Time: 03/30/18  1:38 PM  Result Value Ref Range Status   Specimen Description   Final    URINE, CATHETERIZED Performed at [facility] [address], [place], [place] [address]    Special Requests   Final    Normal Performed at [facility], [place] [address], [place], [place] [address]    Culture >=100,000 COLONIES/mL KLEBSIELLA PNEUMONIAE (A)  Final   Report Status 04/01/2018 FINAL  Final   Organism ID, Bacteria KLEBSIELLA PNEUMONIAE (A)  Final      Susceptibility   Klebsiella pneumoniae - MIC*    AMPICILLIN >=32 RESISTANT Resistant     CEFAZOLIN <=4 SENSITIVE Sensitive     CEFTRIAXONE <=1 SENSITIVE Sensitive      CIPROFLOXACIN <=0.25 SENSITIVE Sensitive     GENTAMICIN <=1 SENSITIVE Sensitive     IMIPENEM <=0.25 SENSITIVE Sensitive     NITROFURANTOIN 64 INTERMEDIATE Intermediate     TRIMETH/SULFA <=20 SENSITIVE Sensitive     AMPICILLIN/SULBACTAM 4 SENSITIVE Sensitive     PIP/TAZO <=4 SENSITIVE Sensitive     Extended ESBL NEGATIVE Sensitive     * >=100,000 COLONIES/mL KLEBSIELLA PNEUMONIAE     Labs: Basic Metabolic Panel: Recent Labs  Lab 03/31/18 1433 03/31/18 1818 03/31/18 2213 04/01/18 0238 04/01/18 0540  NA 154* 151* 151* 150* 148*  K 4.8 4.5 4.4 4.4 4.2  CL >130* 123* 124* 125* 123*  CO2 15* 16* 17* 17* 18*  GLUCOSE 126* 105* 105* 138* 127*  BUN 104* 96* 98* 90* 86*  CREATININE 4.49* 4.26* 4.02* 3.81* 3.60*  CALCIUM 9.3 9.4 9.4 9.4 9.4   Liver Function Tests: No results for input(s): AST, ALT, ALKPHOS, BILITOT, PROT, ALBUMIN in the last 168 hours. No results for input(s): LIPASE, AMYLASE in the last 168 hours. No results for input(s): AMMONIA in the last 168 hours. CBC: Recent  Labs  Lab 03/30/18 1303 03/31/18 0558 04/01/18 0238 04/01/18 1141  WBC 8.0 9.5 7.9  --   NEUTROABS 6.1  --   --   --   HGB 9.1* 8.2* 7.5* 8.0*  HCT 29.4* 26.1* 23.6* 25.3*  MCV 86.2 86.4 85.8  --   PLT 311 271 255  --    Cardiac Enzymes: No results for input(s): CKTOTAL, CKMB, CKMBINDEX, TROPONINI in the last 168 hours. BNP: BNP (last 3 results) Recent Labs    01/08/18 1029  BNP 18.9    ProBNP (last 3 results) No results for input(s): PROBNP in the last 8760 hours.  CBG: No results for input(s): GLUCAP in the last 168 hours.     Signed:  Kayleen Memos, MD [facility] 04/01/2018, 4:06 PM

## 2018-04-01 NOTE — Discharge Instructions (Signed)
Dehydration Dehydration is when there is not enough fluid or water in your body. This happens when you lose more fluids than you take in. People who are age 80 or older have a higher risk of getting dehydrated. Dehydration can range from mild to very bad. It should be treated right away to keep it from getting very bad. Symptoms of mild dehydration may include:  Thirst.  Dry lips.  Slightly dry mouth.  Dry, warm skin.  Dizziness. Symptoms of moderate dehydration may include:  Very dry mouth.  Muscle cramps.  Dark pee (urine). Pee may be the color of tea.  Your body making less pee.  Your eyes making fewer tears.  Heartbeat that is uneven or faster than normal (palpitations).  Headache.  Light-headedness, especially when you stand up from sitting.  Fainting (syncope). Symptoms of very bad dehydration may include:  Changes in skin, such as: ? Cold and clammy skin. ? Blotchy (mottled) or pale skin. ? Skin that does not quickly return to normal after being lightly pinched and let go (poor skin turgor).  Changes in body fluids, such as: ? Feeling very thirsty. ? Your eyes making fewer tears. ? Not sweating when body temperature is high, such as in hot weather. ? Your body making very little pee.  Changes in vital signs, such as: ? Weak pulse. ? Pulse that is more than 100 beats a minute when you are sitting still. ? Fast breathing. ? Low blood pressure.  Other changes, such as: ? Sunken eyes. ? Cold hands and feet. ? Confusion. ? Lack of energy (lethargy). ? Trouble waking up from sleep. ? Short-term weight loss. ? Unconsciousness. Follow these instructions at home:  If told by your doctor, drink an ORS: ? Make an ORS by using instructions on the package. ? Start by drinking small amounts, about  cup (120 mL) every 5-10 minutes. ? Slowly drink more until you have had the amount that your doctor said to have.  Drink enough clear fluid to keep your pee  clear or pale yellow. If you were told to drink an ORS, finish the ORS first, then start slowly drinking clear fluids. Drink fluids such as: ? Water. Do not drink only water by itself. Doing that can make the salt (sodium) level in your body get too low (hyponatremia). ? Ice chips. ? Fruit juice that you have added water to (diluted). ? Low-calorie sports drinks.  Avoid: ? Alcohol. ? Drinks that have a lot of sugar. These include high-calorie sports drinks, fruit juice that does not have water added, and soda. ? Caffeine. ? Foods that are greasy or have a lot of fat or sugar.  Take over-the-counter and prescription medicines only as told by your doctor.  Do not take salt tablets. Doing that can make the salt level in your body get too high (hypernatremia).  Eat foods that have minerals (electrolytes). Examples include bananas, oranges, potatoes, tomatoes, and spinach.  Keep all follow-up visits as told by your doctor. This is important. Contact a doctor if:  You have belly (abdominal) pain that: ? Gets worse. ? Stays in one area (localizes).  You have a rash.  You have a stiff neck.  You get angry or annoyed more easily than normal (irritability).  You are more sleepy than normal.  You have a harder time waking up than normal.  You feel: ? Weak. ? Dizzy. ? Very thirsty. Get help right away if:  You have symptoms of very bad dehydration.  You cannot drink fluids without throwing up (vomiting).  Your symptoms get worse with treatment.  You have a fever.  You have a very bad headache.  You are throwing up or having watery poop (diarrhea) and it: ? Gets worse. ? Does not go away.  You have diarrhea for more than 24 hours.  You have blood or something green (bile) in your throw-up.  You have blood in your poop (stool). This may cause poop to look black and tarry.  You have not peed in 6-8 hours.  You have peed (urinated) only a small amount of very dark pee  during 6-8 hours.  You pass out (faint).  Your heart rate when you are sitting still is more than 100 beats a minute.  You have trouble breathing. This information is not intended to replace advice given to you by your health care provider. Make sure you discuss any questions you have with your health care provider. Document Released: 06/16/2011 Document Revised: 01/15/2016 Document Reviewed: 08/21/2015 Elsevier Interactive Patient Education  2018 [employer].   Hypernatremia Hypernatremia is when the blood has too much salt (sodium) in it and not enough water. Water and salt must be balanced in the blood. This condition is serious and often an emergency. Follow these instructions at home:  Drink enough fluid to keep your pee (urine) clear or pale yellow.  Take over-the-counter and prescription medicines only as told by your doctor.  Avoid salty processed foods, including canned, jarred, frozen, or boxed foods. Some examples include pickles, frozen dinners, canned soups, potato and corn chips, and olives.  Always drink fluids after exercise.  Always drink fluids after throwing up (vomiting) or having watery poop (diarrhea).  Keep all follow-up visits as told by your doctor. This is important. Contact a doctor if:  You have watery poop.  You throw up.  You have a fever or chills.  You cannot follow advice from your doctor about drinking fluids. Get help right away if:  You feel light-headed or weak.  You have a fast heartbeat.  You get confused.  You get restless.  You get short-tempered.  You have a fit of movements that you cannot control (seizure).  You faint.  You have signs of losing too much body fluid (dehydration), such as: ? Dark pee, or very little or no pee. ? Cracked lips. ? No tears. ? Dry mouth. ? Sunken eyes. ? Sleepiness. This information is not intended to replace advice given to you by your health care provider. Make sure you discuss any  questions you have with your health care provider. Document Released: 06/16/2011 Document Revised: 12/03/2015 Document Reviewed: 11/12/2014 Elsevier Interactive Patient Education  [employer].

## 2018-04-01 NOTE — Progress Notes (Signed)
PROGRESS NOTE  Brian Montgomery [id number]:[id number] DOB: September 02, 1935 DOA: 03/30/2018 PCP: Denita Lung, MD  HPI/Recap of past 24 hours: Brian Montgomery is a 82 y.o. male with medical history significant of Parkinson's disease, supranuclear palsy, dementia, bladder cancer, hypertension, hyperlipidemia, who is being brought to the hospital by patient's family due to progressive lethargy and decline at home in the last couple weeks as well as little to no p.o. intake for the same amount of time.  Patient was hospitalized in July 2019 with aspiration pneumonia of the right lower lobe, and upon discharge he was sent to a skilled nursing facility.  Of note, he has been having issues with swallowing in the past and currently he has had difficulties more so at home.  Admitted for progressive lethargy suspected secondary to hypernatremia/hyperkalemia/dehydration versus others.  03/31/2018: He is alert and minimally interactive in the setting of dementia.  Appears hypovolemic.  Has no new complaints.  04/01/2018: Patient seen and examined at bedside.  No acute events overnight.  Patient is alert however he is minimally verbal.  He has no new complaints.  Assessment/Plan: Active Problems:   Dehydration   Acute renal failure (HCC)   Hypernatremia   Urinary tract infection without hematuria  Generalized weakness/lethargy suspect secondary to hypernatremia, hyperkalemia, and severe dehydration Electrolytes are improving with IV fluid hydration Continue D5W at 75 cc/h Repeat BMP in the morning Fall precautions  Klebsiella pneumoniae urinary tract infection-non-ESBL  urine analysis with WBC greater than 50 Urine culture positive for 100,000 colonies of Klebsiella pneumonia non-ESBL Sensitive to IV ceftriaxone, continue   Resolved hyperkalemia suspect secondary to renal failure Received 1 g calcium gluconate given once, Kayexalate 30 g given once, bowel regimen  Improving acute kidney injury on CKD 3 Creatinine  on presentation 6.37 with GFR of 9 Baseline creatinine is 1.3 Continue to monitor ins and outs Creatinine 3.6 on 04/01/2018 Continue IV fluid Continue to monitor urine output Continue to avoid nephrotoxic agents/dehydration/hypotension  Chronic normocytic anemia Baseline hemoglobin 9 Drop in hemoglobin this morning.  Repeated an H&H which was 8.0 FOBT and iron studies ordered.   No sign of overt bleeding Obtain CBC in the morning   Physical debility/ambulatory dysfunction PT recommended SNF and 24-hour assistance and monitoring Fall precautions  Parkinson disease Continue home medications  History of DVT on Eliquis No acute complaints No sign of overt bleeding  History of bladder cancer No acute issues  History of dysphagia with recent admission for aspiration pneumonia Speech therapy recommended pured diet with thick liquids   Code Status: DNR  Family Communication: None at bedside  Disposition Plan: Home versus SNF in 1 to 2 days or when clinically stable possibly tomorrow 04/02/2018   Consultants:  None  Procedures:  None  Antimicrobials:  Rocephin  DVT prophylaxis: Eliquis   Objective: Vitals:   03/31/18 1428 03/31/18 1636 03/31/18 2035 04/01/18 0519  BP: 120/67  138/71 126/83  Pulse: 71  79 98  Resp: 18  19 16   Temp: 97.9 F (36.6 C) 98.3 F (36.8 C) 98.2 F (36.8 C) 98 F (36.7 C)  TempSrc: Oral Axillary Axillary Axillary  SpO2: 100%  98% 97%  Weight:      Height:        Intake/Output Summary (Last 24 hours) at 04/01/2018 1241 Last data filed at 04/01/2018 0803 Gross per 24 hour  Intake 905.04 ml  Output 1150 ml  Net -244.96 ml   Filed Weights   03/30/18 1138  Weight: 54.4 kg  Exam:  . General: 82 y.o. year-old male frail in no acute distress.  Alert and minimally verbal in the setting of dementia. . Cardiovascular: Regular rate and rhythm with no rubs or gallops.  No JVD or thyromegaly noted. Marland Kitchen Respiratory: Clear to  auscultation with no wheezes or rales. Good inspiratory effort. . Abdomen: Soft nontender nondistended with normal bowel sounds x4 quadrants. . Musculoskeletal: No lower extremity edema. 2/4 pulses in all 4 extremities. Marland Kitchen Psychiatry: Mood is appropriate for condition and setting   Data Reviewed: CBC: Recent Labs  Lab 03/30/18 1303 03/31/18 0558 04/01/18 0238 04/01/18 1141  WBC 8.0 9.5 7.9  --   NEUTROABS 6.1  --   --   --   HGB 9.1* 8.2* 7.5* 8.0*  HCT 29.4* 26.1* 23.6* 25.3*  MCV 86.2 86.4 85.8  --   PLT 311 271 255  --    Basic Metabolic Panel: Recent Labs  Lab 03/31/18 1433 03/31/18 1818 03/31/18 2213 04/01/18 0238 04/01/18 0540  NA 154* 151* 151* 150* 148*  K 4.8 4.5 4.4 4.4 4.2  CL >130* 123* 124* 125* 123*  CO2 15* 16* 17* 17* 18*  GLUCOSE 126* 105* 105* 138* 127*  BUN 104* 96* 98* 90* 86*  CREATININE 4.49* 4.26* 4.02* 3.81* 3.60*  CALCIUM 9.3 9.4 9.4 9.4 9.4   GFR: Estimated Creatinine Clearance: 12.6 mL/min (A) (by C-G formula based on SCr of 3.6 mg/dL (H)). Liver Function Tests: No results for input(s): AST, ALT, ALKPHOS, BILITOT, PROT, ALBUMIN in the last 168 hours. No results for input(s): LIPASE, AMYLASE in the last 168 hours. No results for input(s): AMMONIA in the last 168 hours. Coagulation Profile: No results for input(s): INR, PROTIME in the last 168 hours. Cardiac Enzymes: No results for input(s): CKTOTAL, CKMB, CKMBINDEX, TROPONINI in the last 168 hours. BNP (last 3 results) No results for input(s): PROBNP in the last 8760 hours. HbA1C: No results for input(s): HGBA1C in the last 72 hours. CBG: No results for input(s): GLUCAP in the last 168 hours. Lipid Profile: No results for input(s): CHOL, HDL, LDLCALC, TRIG, CHOLHDL, LDLDIRECT in the last 72 hours. Thyroid Function Tests: No results for input(s): TSH, T4TOTAL, FREET4, T3FREE, THYROIDAB in the last 72 hours. Anemia Panel: Recent Labs    04/01/18 1141  RETICCTPCT 0.7   Urine  analysis:    Component Value Date/Time   COLORURINE YELLOW 03/30/2018 1350   APPEARANCEUR TURBID (A) 03/30/2018 1350   LABSPEC 1.013 03/30/2018 1350   PHURINE 6.0 03/30/2018 1350   GLUCOSEU NEGATIVE 03/30/2018 1350   HGBUR LARGE (A) 03/30/2018 1350   BILIRUBINUR NEGATIVE 03/30/2018 1350   BILIRUBINUR n 12/02/2015 1535   KETONESUR 5 (A) 03/30/2018 1350   PROTEINUR 100 (A) 03/30/2018 1350   UROBILINOGEN negative 12/02/2015 1535   UROBILINOGEN 0.2 02/17/2010 1259   NITRITE NEGATIVE 03/30/2018 1350   LEUKOCYTESUR MODERATE (A) 03/30/2018 1350   Sepsis Labs: @LABRCNTIP (procalcitonin:4,lacticidven:4)  ) Recent Results (from the past 240 hour(s))  Urine culture     Status: Abnormal   Collection Time: 03/30/18  1:38 PM  Result Value Ref Range Status   Specimen Description   Final    URINE, CATHETERIZED Performed at [facility], [place] [address], [place], [place] [address]    Special Requests   Final    Normal Performed at [facility], [place] [address] [place], [place] [address]    Culture >=100,000 COLONIES/mL KLEBSIELLA PNEUMONIAE (A)  Final   Report Status 04/01/2018 FINAL  Final  Organism ID, Bacteria KLEBSIELLA PNEUMONIAE (A)  Final      Susceptibility   Klebsiella pneumoniae - MIC*    AMPICILLIN >=32 RESISTANT Resistant     CEFAZOLIN <=4 SENSITIVE Sensitive     CEFTRIAXONE <=1 SENSITIVE Sensitive     CIPROFLOXACIN <=0.25 SENSITIVE Sensitive     GENTAMICIN <=1 SENSITIVE Sensitive     IMIPENEM <=0.25 SENSITIVE Sensitive     NITROFURANTOIN 64 INTERMEDIATE Intermediate     TRIMETH/SULFA <=20 SENSITIVE Sensitive     AMPICILLIN/SULBACTAM 4 SENSITIVE Sensitive     PIP/TAZO <=4 SENSITIVE Sensitive     Extended ESBL NEGATIVE Sensitive     * >=100,000 COLONIES/mL KLEBSIELLA PNEUMONIAE      Studies: US Renal  Result Date: 03/31/2018 CLINICAL DATA:  82 year old male with acute on chronic kidney disease and UTI. Patient has RIGHT urinary  stent and history of bladder and prostate cancer. EXAM: RENAL / URINARY TRACT ULTRASOUND COMPLETE COMPARISON:  02/09/2017 CT.  11/23/2017 retrograde study FINDINGS: Right Kidney: Length: 10.4 cm. Urinary stent within the renal pelvis noted. Mild to moderate RIGHT hydronephrosis noted. No solid mass identified. Left Kidney: Length: 10.8 cm. Mild to moderate LEFT hydronephrosis noted. No solid mass identified. Bladder: Urinary stent within the bladder identified. Mild posterior bladder wall thickening identified. IMPRESSION: Mild to moderate bilateral hydronephrosis. RIGHT urinary stent identified. Electronically Signed   By: Margarette Canada M.D.   On: 03/31/2018 17:00    Scheduled Meds: . apixaban  2.5 mg Oral BID  . carbidopa-levodopa  1.5 tablet Oral TID  . lip balm      . QUEtiapine  50 mg Oral QHS    Continuous Infusions: . cefTRIAXone (ROCEPHIN)  IV 1 g (04/01/18 1220)  . dextrose 75 mL/hr at 04/01/18 0546     LOS: 2 days     Kayleen Memos, MD [facility] Pager [phone]  If 7PM-7AM, please contact night-coverage [email or web address] Password [facility] 04/01/2018, 12:41 PM

## 2018-04-01 NOTE — Progress Notes (Signed)
Patient is discharging to [facility]. CSW confirmed placement with facility, prepared d/c packet, and faxed appropriate documents.  [facility] has been called for transport.  RN call report to: [phone].  No more CSW needs. Signing off.   Pricilla Holm, MSW, [place] [facility] [phone]

## 2018-04-01 NOTE — Evaluation (Signed)
Physical Therapy Evaluation Patient Details Name: Brian Montgomery MRN: [id number] DOB: 1936-03-21 Today's Date: 04/01/2018   History of Present Illness  82 y.o. male with medical history significant of Parkinson's disease, supranuclear palsy, dementia, bladder cancer, hypertension, hyperlipidemia, who is being brought to the hospital by patient's family due to progressive lethargy and decline at home in the last couple weeks as well as little to no p.o. intake for the same amount of time  Clinical Impression  Pt admitted with above diagnosis. Pt currently with functional limitations due to the deficits listed below (see PT Problem List). Total assist for supine to sit, max assist for sitting balance, pt sat edge of bed x 5 minutes. Pt not able to provide prior functional level 2* dementia, no family present. Will need 24* assistance upon DC.  Pt will benefit from skilled PT to increase their independence and safety with mobility to allow discharge to the venue listed below.       Follow Up Recommendations SNF;Supervision/Assistance - 24 hour    Equipment Recommendations  None recommended by PT    Recommendations for Other Services       Precautions / Restrictions Precautions Precautions: Fall Restrictions Weight Bearing Restrictions: No      Mobility  Bed Mobility Overal bed mobility: Needs Assistance Bed Mobility: Rolling;Sidelying to Sit;Sit to Sidelying Rolling: +2 for physical assistance;Total assist Sidelying to sit: +2 for physical assistance;Total assist     Sit to sidelying: +2 for physical assistance;Total assist General bed mobility comments: sat edge of bed x 5 minutes with max A for balance (pt 50%)  Transfers                 General transfer comment: NT 2* poor sitting balance  Ambulation/Gait                Stairs            Wheelchair Mobility    Modified Rankin (Stroke Patients Only)       Balance Overall balance assessment: Needs  assistance Sitting-balance support: Feet supported;Bilateral upper extremity supported Sitting balance-Leahy Scale: Zero Sitting balance - Comments: max assist to sit edge of bed x 5 minutes                                     Pertinent Vitals/Pain Faces Pain Scale: No hurt    Home Living Family/patient expects to be discharged to:: Private residence Living Arrangements: Spouse/significant other Available Help at Discharge: Family Type of Home: House Home Access: Ramped entrance     Home Layout: One level Home Equipment: Wheelchair - power;Hospital bed Additional Comments: someone operates power chair for him    Prior Function Level of Independence: Needs assistance   Gait / Transfers Assistance Needed: transfers bed<>chair only  ADL's / Homemaking Assistance Needed: Family provides all care  Comments: All PLOF info obtained from prior notes from prior admission July 2019, pt unable to provide this info 2* dementia. family helps a lot with getting out of bed/chair:  one person.  Uses w/c. Family helps with all ADLs--bed level including rolling to change depends     Hand Dominance   Dominant Hand: Right    Extremity/Trunk Assessment   Upper Extremity Assessment Upper Extremity Assessment: Difficult to assess due to impaired cognition(can grip with both hands, did not respond when asked to elevate UEs)    Lower Extremity Assessment Lower Extremity  Assessment: Difficult to assess due to impaired cognition(reports sensation intact to light touch, can wiggle toes, knees seem to be contracted, PROM knee ext -40* B, no active participation with advancing LEs to EOB)    Cervical / Trunk Assessment Cervical / Trunk Assessment: Kyphotic  Communication   Communication: Expressive difficulties(low volume, somewhat slurred (hadn't had sinemet today prior to this eval))  Cognition Arousal/Alertness: Lethargic Behavior During Therapy: Flat affect;WFL for tasks  assessed/performed Overall Cognitive Status: No family/caregiver present to determine baseline cognitive functioning                                 General Comments: can follow one step commands (wiggle toes, squeeze hand), unable to provide PLOF, noted h/o dementia      General Comments      Exercises     Assessment/Plan    PT Assessment Patient needs continued PT services  PT Problem List Decreased strength;Decreased range of motion;Decreased activity tolerance;Decreased mobility;Decreased balance;Decreased cognition       PT Treatment Interventions Functional mobility training;Therapeutic activities;Therapeutic exercise;Balance training    PT Goals (Current goals can be found in the Care Plan section)  Acute Rehab PT Goals PT Goal Formulation: Patient unable to participate in goal setting Time For Goal Achievement: 04/15/18 Potential to Achieve Goals: Fair    Frequency Min 2X/week   Barriers to discharge        Co-evaluation               AM-PAC PT "6 Clicks" Daily Activity  Outcome Measure Difficulty turning over in bed (including adjusting bedclothes, sheets and blankets)?: Unable Difficulty moving from lying on back to sitting on the side of the bed? : Unable Difficulty sitting down on and standing up from a chair with arms (e.g., wheelchair, bedside commode, etc,.)?: Unable Help needed moving to and from a bed to chair (including a wheelchair)?: Total Help needed walking in hospital room?: Total Help needed climbing 3-5 steps with a railing? : Total 6 Click Score: 6    End of Session   Activity Tolerance: Patient tolerated treatment well Patient left: in bed;with call bell/phone within reach;with bed alarm set Nurse Communication: Mobility status;Need for lift equipment PT Visit Diagnosis: Muscle weakness (generalized) (M62.81);Difficulty in walking, not elsewhere classified (R26.2);Adult, failure to thrive (R62.7)    Time:  0906-0919 PT Time Calculation (min) (ACUTE ONLY): 13 min   Charges:   PT Evaluation $PT Eval Moderate Complexity: 1 Mod          Philomena Doheny PT 04/01/2018  Acute Rehabilitation Services Pager [phone] Office [phone]

## 2018-04-02 ENCOUNTER — Telehealth: Payer: Self-pay

## 2018-04-02 LAB — FOLATE RBC
FOLATE, HEMOLYSATE: 291 ng/mL
Folate, RBC: 1228 ng/mL (ref >498)
Hematocrit: 23.7 % — ABNORMAL LOW (ref 37.5–51.0)

## 2018-04-02 NOTE — Telephone Encounter (Signed)
Called pt to schedule a hospital follow up. Pt daughter informed that patient will not be scheduling as appointment because he was just recently moved to Hospice on 04/01/18. Patient is at [facility].

## 2018-04-10 IMAGING — CT CT HEAD W/O CM
3 series · 16 of 47 positions shown, 19 images · non-contrast
Comparison: None.

CLINICAL DATA: Fall.

EXAM:
CT HEAD WITHOUT CONTRAST
TECHNIQUE: Contiguous axial images were obtained from the base of the skull
through the vertex without intravenous contrast.

[Series 3: head 5.0 h30s · axial · 0.41mm/px · z∈[-124,+11]mm · 10 of 33 slices shown, 13 images]
[im 3/33  brain]
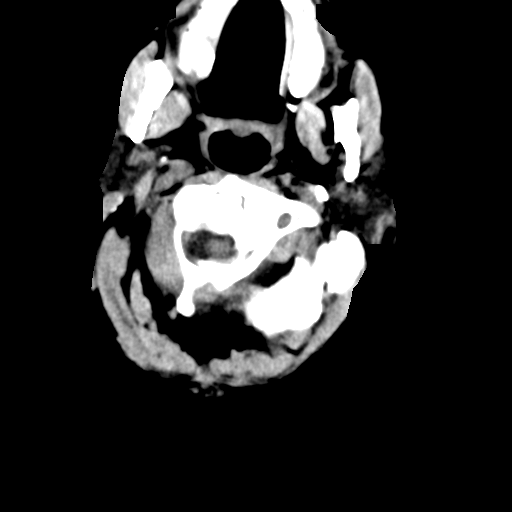
[im 3/33  bone]
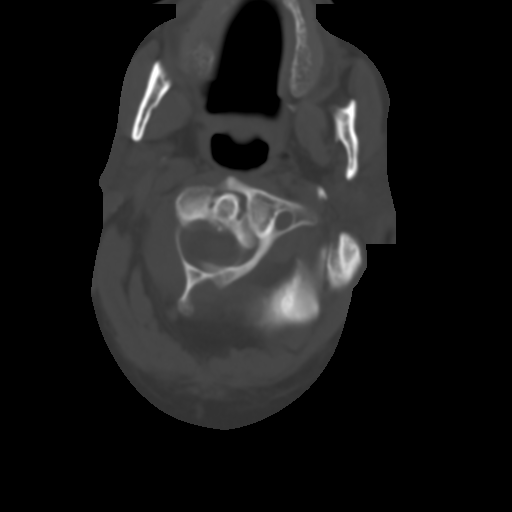
[im 6/33  brain]
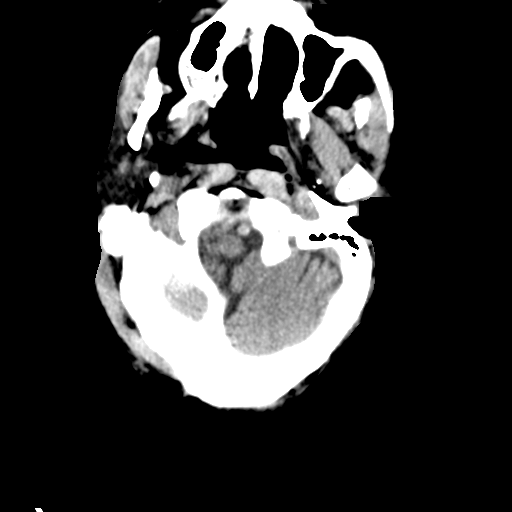
[im 9/33  brain]
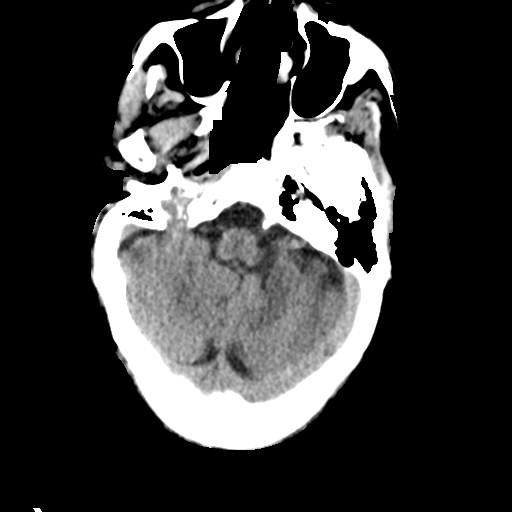
[im 12/33  brain]
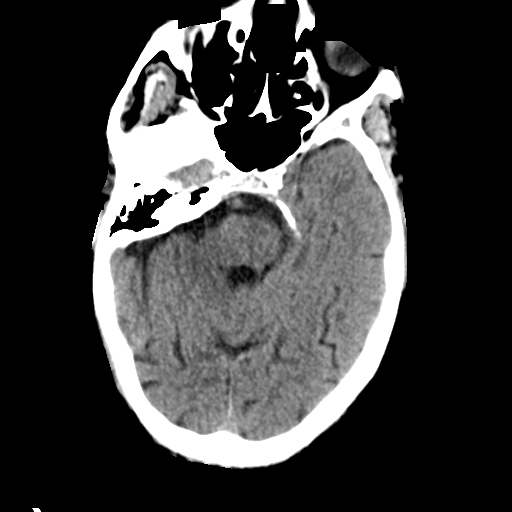
[im 15/33  brain]
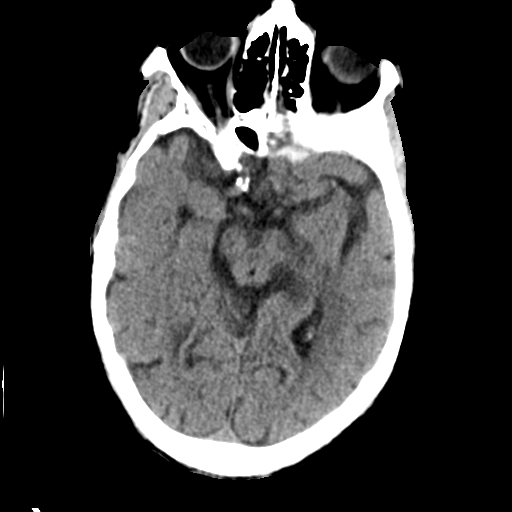
[im 15/33  bone]
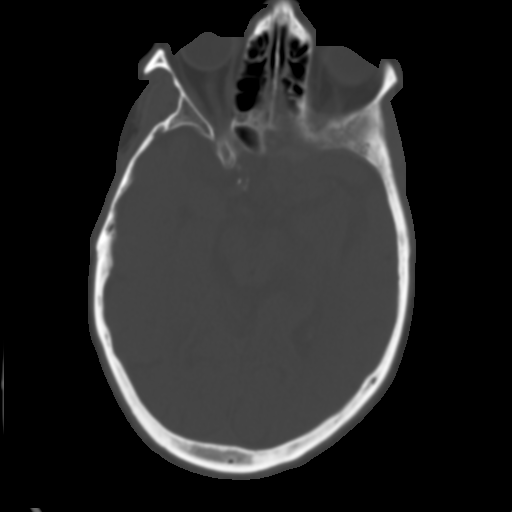
[im 18/33  brain]
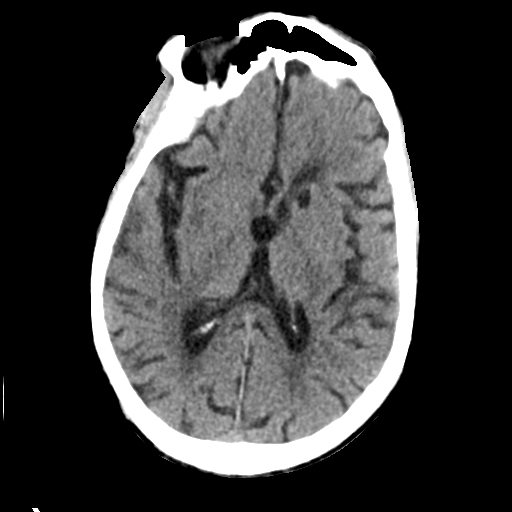
[im 21/33  brain]
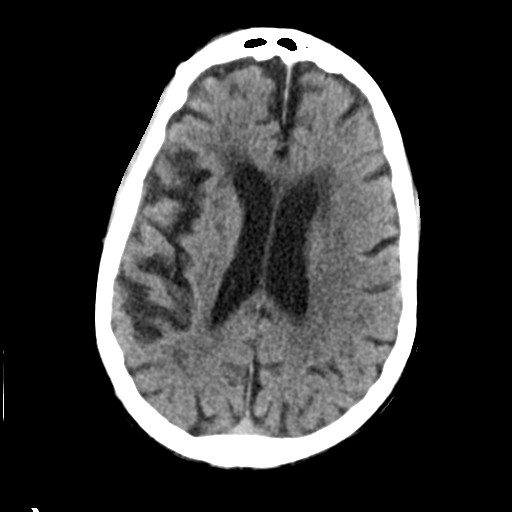
[im 25/33  brain]
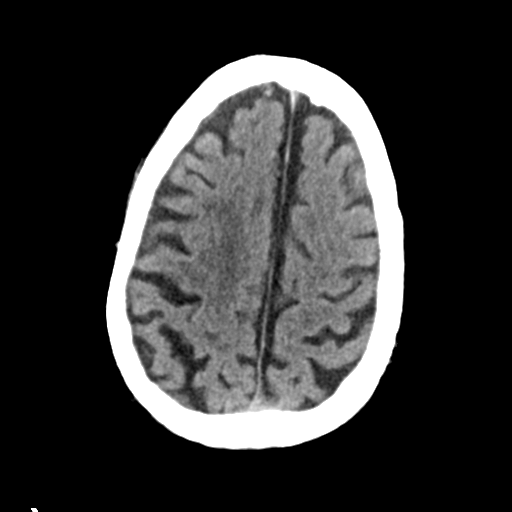
[im 27/33  brain]
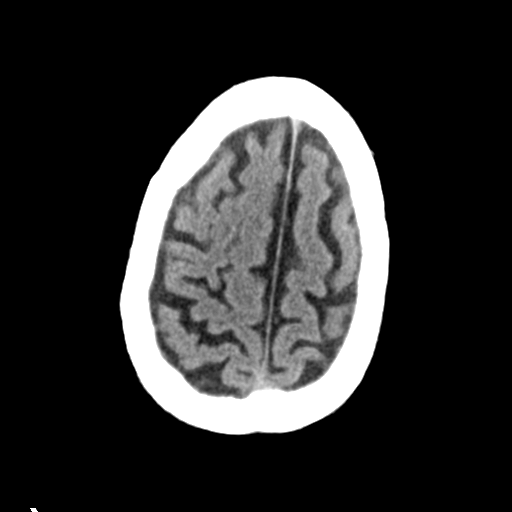
[im 27/33  bone]
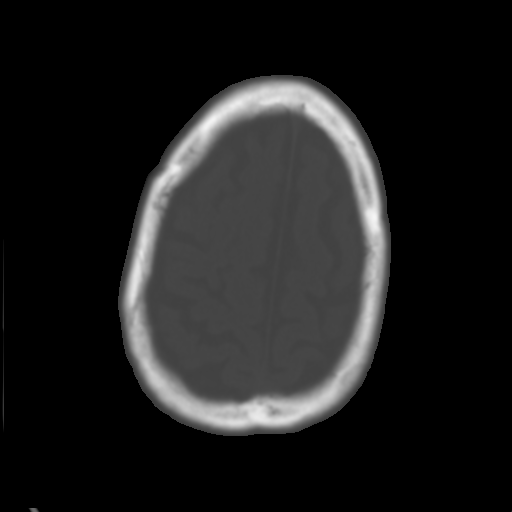
[im 30/33  brain]
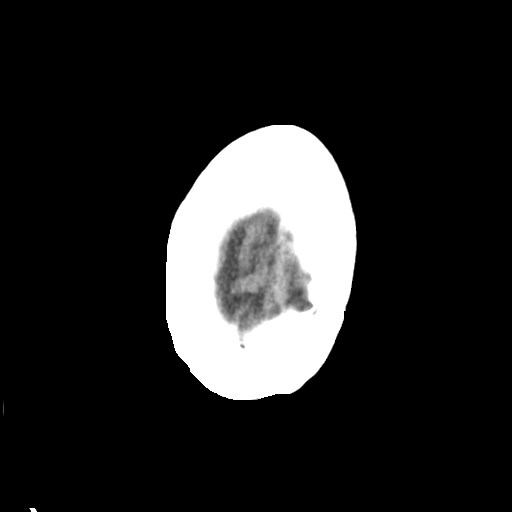

[Series 5: head 3.0 mpr cor · coronal · 0.32mm/px · 3 of 68 slices shown]
[im 23/68  brain]
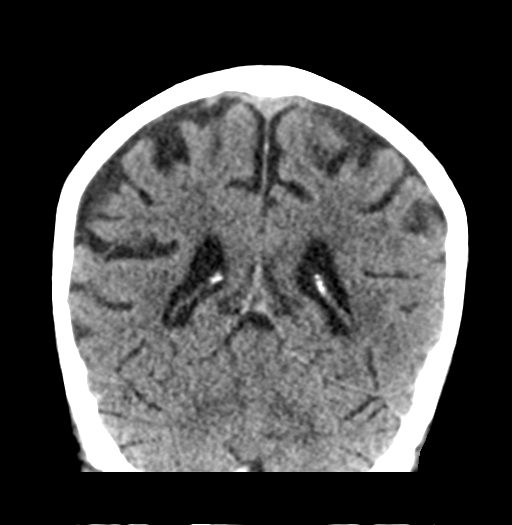
[im 30/68  brain]
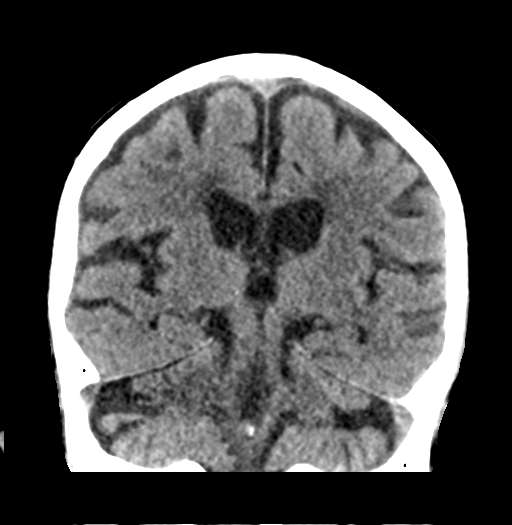
[im 38/68  brain]
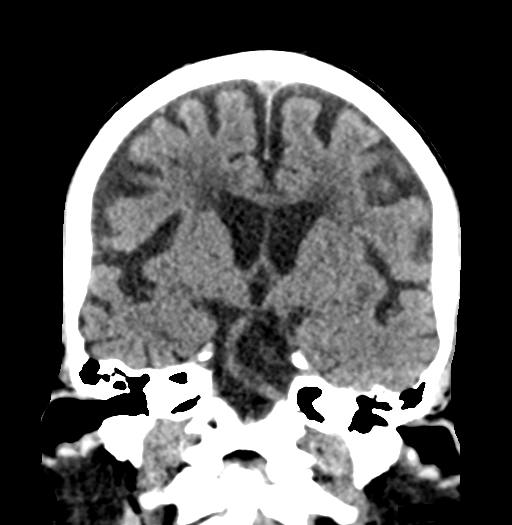

[Series 6: head 3.0 mpr sag · sagittal · 0.33mm/px · 3 of 64 slices shown]
[im 24/64  brain]
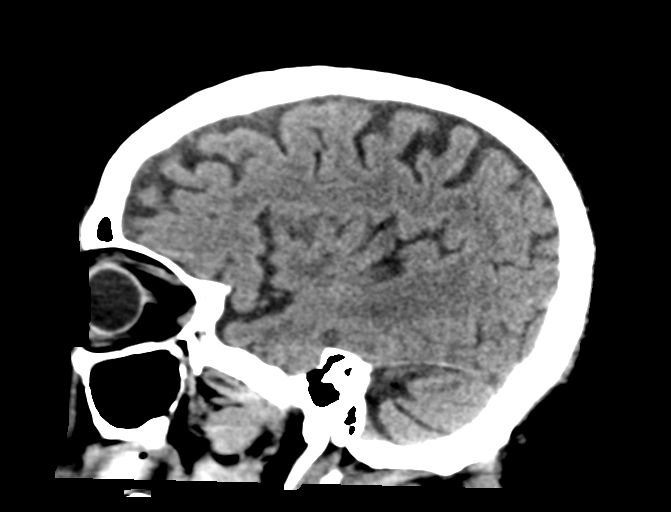
[im 32/64  brain]
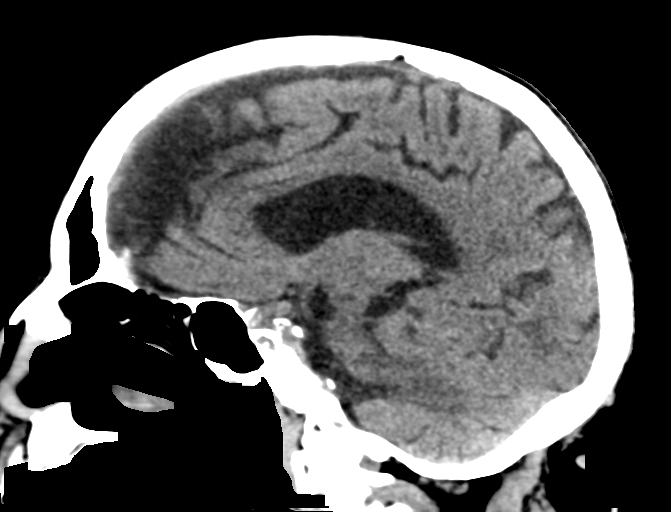
[im 40/64  brain]
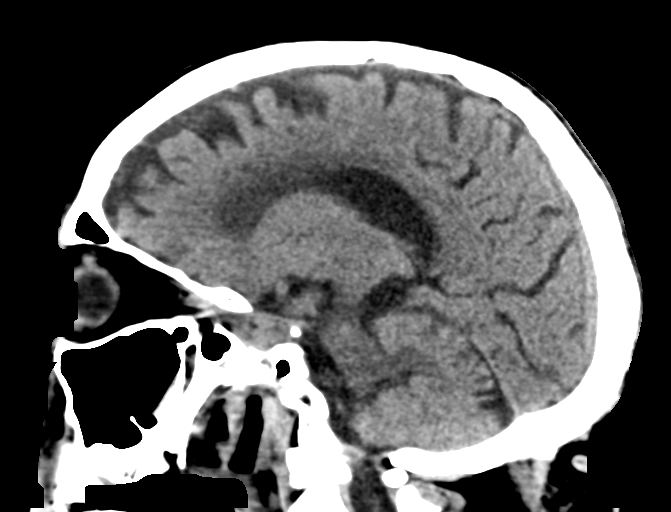

[16 of 47 positions shown; findings below may reference images not displayed]

FINDINGS: Brain: There is atrophy and chronic small vessel disease changes. No
acute intracranial abnormality. Specifically, no hemorrhage,
hydrocephalus, mass lesion, acute infarction, or significant
intracranial injury. Old left basal ganglia lacunar infarct.

Vascular: No hyperdense vessel or unexpected calcification.

Skull: No acute calvarial abnormality.

Sinuses/Orbits: Visualized paranasal sinuses and mastoids clear.
Orbital soft tissues unremarkable.

Other: None
IMPRESSION: No acute intracranial abnormality.

Atrophy, chronic microvascular disease.

Old left basal ganglia lacunar infarct.

## 2018-04-18 ENCOUNTER — Telehealth: Payer: Self-pay | Admitting: Family Medicine

## 2018-04-18 ENCOUNTER — Ambulatory Visit: Payer: Medicare Other | Admitting: Neurology

## 2018-04-18 NOTE — Telephone Encounter (Signed)
Sympathy card sent 

## 2018-05-11 DEATH — deceased
